# Patient Record
Sex: Female | Born: 1960
Health system: Southern US, Community
[De-identification: ages and names within clinical notes are randomized; demographics above are authoritative.]

## PROBLEM LIST (undated history)

## (undated) DIAGNOSIS — I10 Essential (primary) hypertension: Secondary | ICD-10-CM

## (undated) HISTORY — PX: TONSILLECTOMY: SUR1361

---

## 1998-02-26 ENCOUNTER — Ambulatory Visit (HOSPITAL_COMMUNITY): Admission: RE | Admit: 1998-02-26 | Discharge: 1998-02-26 | Payer: Self-pay | Admitting: Obstetrics and Gynecology

## 1998-07-21 ENCOUNTER — Inpatient Hospital Stay (HOSPITAL_COMMUNITY): Admission: AD | Admit: 1998-07-21 | Discharge: 1998-07-24 | Payer: Self-pay | Admitting: Obstetrics and Gynecology

## 1999-06-07 ENCOUNTER — Encounter: Admission: RE | Admit: 1999-06-07 | Discharge: 1999-06-07 | Payer: Self-pay | Admitting: Family Medicine

## 1999-06-07 ENCOUNTER — Encounter: Payer: Self-pay | Admitting: Family Medicine

## 1999-11-17 ENCOUNTER — Other Ambulatory Visit: Admission: RE | Admit: 1999-11-17 | Discharge: 1999-11-17 | Payer: Self-pay | Admitting: Obstetrics & Gynecology

## 2003-08-04 ENCOUNTER — Other Ambulatory Visit: Admission: RE | Admit: 2003-08-04 | Discharge: 2003-08-04 | Payer: Self-pay | Admitting: Family Medicine

## 2004-04-24 HISTORY — PX: OTHER SURGICAL HISTORY: SHX169

## 2006-03-06 ENCOUNTER — Encounter: Admission: RE | Admit: 2006-03-06 | Discharge: 2006-03-06 | Payer: Self-pay | Admitting: Family Medicine

## 2013-02-08 ENCOUNTER — Emergency Department (HOSPITAL_COMMUNITY): Payer: BC Managed Care – PPO

## 2013-02-08 ENCOUNTER — Encounter (HOSPITAL_COMMUNITY): Payer: Self-pay | Admitting: Emergency Medicine

## 2013-02-08 ENCOUNTER — Emergency Department (HOSPITAL_COMMUNITY)
Admission: EM | Admit: 2013-02-08 | Discharge: 2013-02-08 | Disposition: A | Discharge status: Home / Self Care | Payer: BC Managed Care – PPO | Attending: Emergency Medicine | Admitting: Emergency Medicine

## 2013-02-08 DIAGNOSIS — R51 Headache: Secondary | ICD-10-CM | POA: Insufficient documentation

## 2013-02-08 DIAGNOSIS — I10 Essential (primary) hypertension: Secondary | ICD-10-CM | POA: Insufficient documentation

## 2013-02-08 DIAGNOSIS — I160 Hypertensive urgency: Secondary | ICD-10-CM

## 2013-02-08 HISTORY — DX: Essential (primary) hypertension: I10

## 2013-02-08 LAB — CBC WITH DIFFERENTIAL/PLATELET
Basophils Absolute: 0 10*3/uL (ref 0.0–0.1)
Basophils Relative: 1 % (ref 0–1)
Eosinophils Absolute: 0.1 10*3/uL (ref 0.0–0.7)
Eosinophils Relative: 2 % (ref 0–5)
HCT: 39.6 % (ref 36.0–46.0)
Hemoglobin: 13.4 g/dL (ref 12.0–15.0)
Lymphocytes Relative: 43 % (ref 12–46)
Lymphs Abs: 2.4 10*3/uL (ref 0.7–4.0)
MCH: 30.5 pg (ref 26.0–34.0)
MCHC: 33.8 g/dL (ref 30.0–36.0)
MCV: 90.2 fL (ref 78.0–100.0)
Monocytes Absolute: 0.6 10*3/uL (ref 0.1–1.0)
Monocytes Relative: 10 % (ref 3–12)
Neutro Abs: 2.5 10*3/uL (ref 1.7–7.7)
Neutrophils Relative %: 45 % (ref 43–77)
Platelets: 289 10*3/uL (ref 150–400)
RBC: 4.39 MIL/uL (ref 3.87–5.11)
RDW: 13.3 % (ref 11.5–15.5)
WBC: 5.6 10*3/uL (ref 4.0–10.5)

## 2013-02-08 LAB — POCT I-STAT, CHEM 8
BUN: 14 mg/dL (ref 6–23)
Calcium, Ion: 1.23 mmol/L (ref 1.12–1.23)
Chloride: 103 mEq/L (ref 96–112)
Creatinine, Ser: 1.1 mg/dL (ref 0.50–1.10)
HCT: 42 % (ref 36.0–46.0)
Potassium: 3.4 mEq/L — ABNORMAL LOW (ref 3.5–5.1)
Sodium: 143 mEq/L (ref 135–145)

## 2013-02-08 LAB — POCT I-STAT TROPONIN I: Troponin i, poc: 0 ng/mL (ref 0.00–0.08)

## 2013-02-08 MED ORDER — LISINOPRIL 20 MG PO TABS: 20.0000 mg | ORAL_TABLET | Freq: Every day | ORAL | Status: DC

## 2013-02-08 NOTE — ED Provider Notes (Signed)
CSN: [id number]     Arrival date & time 02/08/13  1718 History   First MD Initiated Contact with Patient 02/08/13 1733     Chief Complaint  Patient presents with  . Hypertension   (Consider location/radiation/quality/duration/timing/severity/associated sxs/prior Treatment) HPI Comments: Patient presents from [facility] on [address] where the patient states she went because of about 1 week history of "not feeling right".  States just a mild nagging headache through out the day, was relieved with aleve.  States no blurred vision, nausea, vomiting, chest pain or shortness of breath.  She states that she had lost weight about 1 year ago and was taken off medication (lisinopril).  She states that she has not gained weight.  She states that at urgent care they noted her blood pressure to be 217/114.  She reports it has never been that high.  She also had an EKG done that showed left axis deviation and possible old lateral infarct.  Patient denies any knowledge of this.  She was given clonidine 0.2 mg po there.  Patient is a 52 y.o. female presenting with hypertension. The history is provided by the patient. No language interpreter was used.  Hypertension This is a recurrent problem. The current episode started in the past 7 days. The problem occurs constantly. The problem has been unchanged. Associated symptoms include headaches. Pertinent negatives include no abdominal pain, arthralgias, chest pain, chills, coughing, diaphoresis, fatigue, myalgias, nausea, neck pain, swollen glands, urinary symptoms, visual change, vomiting or weakness. Nothing aggravates the symptoms. She has tried nothing for the symptoms. The treatment provided no relief.    Past Medical History  Diagnosis Date  . Hypertension    Past Surgical History  Procedure Laterality Date  . Tonsillectomy     History reviewed. No pertinent family history. History  Substance Use Topics  . Smoking status: Never Smoker   .  Smokeless tobacco: Not on file  . Alcohol Use: No   OB History   Grav Para Term Preterm Abortions TAB SAB Ect Mult Living                 Review of Systems  Constitutional: Negative for chills, diaphoresis and fatigue.  Respiratory: Negative for cough.   Cardiovascular: Negative for chest pain.  Gastrointestinal: Negative for nausea, vomiting and abdominal pain.  Musculoskeletal: Negative for arthralgias, myalgias and neck pain.  Neurological: Positive for headaches. Negative for weakness.  All other systems reviewed and are negative.    Allergies  Review of patient's allergies indicates no known allergies.  Home Medications  No current outpatient prescriptions on file. BP 162/102  Pulse 86  Temp(Src) 97.6 F (36.4 C) (Oral)  Resp 17  SpO2 98%  LMP 01/19/2013 Physical Exam  Nursing note and vitals reviewed. Constitutional: She is oriented to person, place, and time. She appears well-developed and well-nourished. No distress.  HENT:  Head: Normocephalic and atraumatic.  Right Ear: External ear normal.  Left Ear: External ear normal.  Nose: Nose normal.  Mouth/Throat: Oropharynx is clear and moist. No oropharyngeal exudate.  Eyes: Conjunctivae are normal. Pupils are equal, round, and reactive to light. No scleral icterus.  Neck: Normal range of motion. Neck supple.  Cardiovascular: Normal rate, regular rhythm and normal heart sounds.  Exam reveals no gallop and no friction rub.   No murmur heard. Pulmonary/Chest: Effort normal. No respiratory distress. She has no wheezes. She has no rales. She exhibits no tenderness.  Abdominal: Soft. Bowel sounds are normal. She exhibits no  distension. There is no tenderness.  Musculoskeletal: Normal range of motion. She exhibits no edema and no tenderness.  Lymphadenopathy:    She has no cervical adenopathy.  Neurological: She is alert and oriented to person, place, and time. No cranial nerve deficit. She exhibits normal muscle tone.  Coordination normal.  Skin: Skin is warm and dry. No rash noted. No erythema. No pallor.  Psychiatric: She has a normal mood and affect. Her behavior is normal. Judgment and thought content normal.    ED Course  Procedures (including critical care time) Labs Review Labs Reviewed  CBC WITH DIFFERENTIAL   Imaging Review No results found.  EKG Interpretation   None      Results for orders placed during the hospital encounter of 02/08/13  CBC WITH DIFFERENTIAL      Result Value Range   WBC 5.6  4.0 - 10.5 K/uL   RBC 4.39  3.87 - 5.11 MIL/uL   Hemoglobin 13.4  12.0 - 15.0 g/dL   HCT 13.0  86.5 - 78.4 %   MCV 90.2  78.0 - 100.0 fL   MCH 30.5  26.0 - 34.0 pg   MCHC 33.8  30.0 - 36.0 g/dL   RDW 69.6  29.5 - 28.4 %   Platelets 289  150 - 400 K/uL   Neutrophils Relative % 45  43 - 77 %   Neutro Abs 2.5  1.7 - 7.7 K/uL   Lymphocytes Relative 43  12 - 46 %   Lymphs Abs 2.4  0.7 - 4.0 K/uL   Monocytes Relative 10  3 - 12 %   Monocytes Absolute 0.6  0.1 - 1.0 K/uL   Eosinophils Relative 2  0 - 5 %   Eosinophils Absolute 0.1  0.0 - 0.7 K/uL   Basophils Relative 1  0 - 1 %   Basophils Absolute 0.0  0.0 - 0.1 K/uL  POCT I-STAT, CHEM 8      Result Value Range   Sodium 143  135 - 145 mEq/L   Potassium 3.4 (*) 3.5 - 5.1 mEq/L   Chloride 103  96 - 112 mEq/L   BUN 14  6 - 23 mg/dL   Creatinine, Ser 1.32  0.50 - 1.10 mg/dL   Glucose, Bld 96  70 - 99 mg/dL   Calcium, Ion 4.40  1.02 - 1.23 mmol/L   TCO2 26  0 - 100 mmol/L   Hemoglobin 14.3  12.0 - 15.0 g/dL   HCT 72.5  36.6 - 44.0 %  POCT I-STAT TROPONIN I      Result Value Range   Troponin i, poc 0.00  0.00 - 0.08 ng/mL   Comment 3            Dg Chest 2 View  02/08/2013   CLINICAL DATA:  Hypertension  EXAM: CHEST  2 VIEW  COMPARISON:  None.  FINDINGS: The heart size and mediastinal contours are within normal limits. Both lungs are clear. The visualized skeletal structures are unremarkable.  IMPRESSION: No active cardiopulmonary  disease.   Electronically Signed   By: Charlett Nose M.D.   On: 02/08/2013 18:57      MDM  Hypertensive Urgency  Patient with previous history of HTN presents to the ER after having been seen at [facility] with hypertensive urgency.  She initially was given clonidine 0.2 mg orally.  Blood pressure now 146/98, patient reports feeling "back to normal".  No evidence of end organ damage.  Patient to follow up with Dr.  Denton Lank this coming week but I will start her back on her lisinopril.   Izola Price Marisue Humble, PA-C 02/08/13 2020

## 2013-02-08 NOTE — ED Notes (Addendum)
Pt c/o "not feeling right".  Went to [facility] and was told that her BP was over 200.  EKG from UC shows old MI.  Pt states that she took herself off her BP medicine a year ago because "she was doing good".

## 2013-02-08 NOTE — ED Provider Notes (Signed)
Medical screening examination/treatment/procedure(s) were performed by non-physician practitioner and as supervising physician I was immediately available for consultation/collaboration.   Rolan Bucco, MD 02/08/13 2046

## 2019-04-25 DIAGNOSIS — C801 Malignant (primary) neoplasm, unspecified: Secondary | ICD-10-CM

## 2019-04-25 HISTORY — DX: Malignant (primary) neoplasm, unspecified: C80.1

## 2019-10-15 ENCOUNTER — Other Ambulatory Visit: Payer: Self-pay | Admitting: Physician Assistant

## 2019-10-15 DIAGNOSIS — R5381 Other malaise: Secondary | ICD-10-CM

## 2019-10-15 DIAGNOSIS — N644 Mastodynia: Secondary | ICD-10-CM

## 2019-11-13 ENCOUNTER — Other Ambulatory Visit: Payer: Self-pay

## 2019-11-13 ENCOUNTER — Ambulatory Visit
Admission: RE | Admit: 2019-11-13 | Discharge: 2019-11-13 | Disposition: A | Discharge status: Home / Self Care | Payer: Commercial Managed Care - PPO | Source: Ambulatory Visit | Attending: Physician Assistant | Admitting: Physician Assistant

## 2019-11-13 ENCOUNTER — Other Ambulatory Visit: Payer: Self-pay | Admitting: Physician Assistant

## 2019-11-13 DIAGNOSIS — N644 Mastodynia: Secondary | ICD-10-CM

## 2019-11-25 ENCOUNTER — Other Ambulatory Visit: Payer: Self-pay

## 2019-11-25 ENCOUNTER — Ambulatory Visit
Admission: RE | Admit: 2019-11-25 | Discharge: 2019-11-25 | Disposition: A | Discharge status: Home / Self Care | Payer: Commercial Managed Care - PPO | Source: Ambulatory Visit | Attending: Physician Assistant | Admitting: Physician Assistant

## 2019-11-25 ENCOUNTER — Other Ambulatory Visit: Payer: Self-pay | Admitting: Physician Assistant

## 2019-11-25 DIAGNOSIS — N644 Mastodynia: Secondary | ICD-10-CM

## 2019-12-01 ENCOUNTER — Encounter: Payer: Self-pay | Admitting: *Deleted

## 2019-12-01 DIAGNOSIS — C50411 Malignant neoplasm of upper-outer quadrant of right female breast: Secondary | ICD-10-CM

## 2019-12-01 DIAGNOSIS — Z170 Estrogen receptor positive status [ER+]: Secondary | ICD-10-CM

## 2019-12-01 NOTE — Progress Notes (Signed)
[place]   [phone] Fax:([phone] [place] Note   Patient Care Team: Julian Hy, PA-C as PCP - General (Physician Assistant) [name], MD as Consulting Physician (General Surgery) Truitt Merle, MD as Consulting Physician (Hematology) Kyung Rudd, MD as Consulting Physician (Radiation Oncology) Mauro Kaufmann, RN as Oncology Nurse Navigator Rockwell Germany, RN as Oncology Nurse Navigator  Date of Service:  12/03/2019   CHIEF COMPLAINTS/PURPOSE OF CONSULTATION:  Newly Diagnosed Malignant neoplasm of upper-outer quadrant of right breast     Oncology History Overview Note  Cancer Staging Malignant neoplasm of upper-outer quadrant of right breast in female, estrogen receptor positive ([place]) Staging form: Breast, AJCC 8th Edition - Clinical stage from 11/25/2019: Stage IIA (cT3, cN1, cM0, G2, ER+, PR+, HER2+) - Signed by Truitt Merle, MD on 12/03/2019    Malignant neoplasm of upper-outer quadrant of right breast in female, estrogen receptor positive ([place])  11/13/2019 Mammogram    Diagnostic Mammogram 11/13/19  IMPRESSION: -Highly suspicious mass and calcifications in the right breast centered between 9 and 12 o'clock spanning up to 11 cm.  -Multiple masses in the right axilla. One of the masses contains calcifications, denoted as mass number 2 measuring 9 x 11 by 10 mm. Several of these masses do not appear to represent definitive lymph nodes. Others may be small but abnormal appearing lymph nodes. Taken in total, a cluster of masses in the right axilla spans up to 3.7 cm.  -There are fibrocystic changes on the left. There are also some solid-appearing masses. A solid appearing mass at 10:30, 6 cm from the nipple measures 9 x 6 x 6 mm. An adjacent solid mass at 10 o'clock, 6 cm from the nipple measures 8 x 6 by 5 mm. Another solid-appearing mass at 11 o'clock, 1 cm from the nipple measures 5 x 4 by 5 mm. Fibrocystic changes are  seen at 10 o'clock, 12 o'clock, and 4 o'clock. Another solid-appearing mass seen at 5 o'clock, 4 cm from the nipple measuring 6 x 3 x 4 mm. There is a single abnormal node in the left axilla with a cortex measuring 5 mm and restriction of the fatty hilum.   11/25/2019 Initial Biopsy   Diagnosis 1. Breast, right, needle core biopsy, upper outer - INVASIVE MAMMARY CARCINOMA - MAMMARY CARCINOMA IN SITU WITH CALCIFICATIONS AND NECROSIS - SEE COMMENT 2. Lymph node, needle/core biopsy, right axilla - INVASIVE MAMMARY CARCINOMA - SEE COMMENT 3. Lymph node, needle/core biopsy, left axilla - BENIGN LYMPH NODE - NO CARCINOMA IDENTIFIED Microscopic Comment 1. The biopsy material shows an infiltrative proliferation of cells arranged linearly and in small clusters. Based on the biopsy, the carcinoma appears Nottingham grade 2 of 3 and measures 1.2 cm in greatest linear extent. E-cadherin and prognostic markers (ER/PR/ki-67/HER2)are pending and will be reported in an addendum. Dr. Tresa Moore reviewed the case and agrees with the above diagnosis. These results were called to [facility] on November 26, 2019. 2. Definitive nodal tissue is not identified. This biopsy has a slightly different architecture morphology than what is seen in part 1. The carcinoma measures 0.7 cm in greatest dimension. E-cadherin and prognostic markers (ER, PR, Ki-67 and HER-2) are pending and will be reported in an addendum.   11/25/2019 Receptors her2   1. PROGNOSTIC INDICATORS Results: IMMUNOHISTOCHEMICAL AND MORPHOMETRIC ANALYSIS PERFORMED MANUALLY The tumor cells are POSITIVE for Her2 (3+). Estrogen Receptor: 90%, POSITIVE, STRONG STAINING INTENSITY Progesterone Receptor: 20%, POSITIVE, STRONG  STAINING INTENSITY Proliferation Marker Ki67: 30%    2. PROGNOSTIC INDICATORS Results: IMMUNOHISTOCHEMICAL AND MORPHOMETRIC ANALYSIS PERFORMED MANUALLY The tumor cells are POSITIVE for Her2 (3+). Estrogen  Receptor: 90%, POSITIVE, STRONG STAINING INTENSITY Progesterone Receptor: 25%, POSITIVE, STRONG STAINING INTENSITY Proliferation Marker Ki67: 30%      11/25/2019 Cancer Staging   Staging form: Breast, AJCC 8th Edition - Clinical stage from 11/25/2019: Stage IIA (cT3, cN1, cM0, G2, ER+, PR+, HER2+) - Signed by Truitt Merle, MD on 12/03/2019   12/01/2019 Initial Diagnosis   Malignant neoplasm of upper-outer quadrant of right breast in female, estrogen receptor positive ([place])    Neo-Adjuvant Chemotherapy   PENDING neo-adjuvant TCHP (chemo Docetaxel and Carboplatin with Antibody Herceptin and Perjeta) q3weeks for 6 cycles followed by maintenance Herceptin and Perjeta to complete 1 year treatment.       HISTORY OF PRESENTING ILLNESS:  Carolyn Stewart 59 y.o. female is a here because of newly diagnosed right breast cancer. The patient presents to the clinic today accompanied by her husband.  She noticed change in her right breast first 2 months ago. She notes right breast flatter and square than round with mild occasional right lateral breast. She notes chronic nipple inversion was stable. She denies any change in her left breast. She was seen by her doctor who referred her to breast center. She was having yearly mammograms before this at a [facility] in town, but has been nearly 10 yeas since her last mammogram. She denies any new back or hip pain. She notes occasional left hip pain. She denies change in appetite and has been trying to lose weight. She has been able to lose 25 pounds in 2-3 months with weight watchers, diet and exercise. This is not the first time she has done this, this is standard for her attempts with weight loss. She notes her energy has decreased but able to do her usual activities. She feels she is not overly stressed from this diagnosis.   Socially she is married with children. She drinks alcohol socially. She is a non-smoker. 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 Her  husband gave his FMLA paperwork as he may need to take off work for her. I recommend her 2 daughters start breast Mammograms at age 22 and continue yearly.  She has a PMHx of HTN on metoprolol and amlodipine for the last 2 years. She had tonsillectomy at age 25 and carotid artery tumor resected 15 years ago. She denies family history of cancer, but HTN runs in her family. She notes her menopause still occurs with manageable hot flashes.     GYN HISTORY  Menarchal: 11 LMP: 02/2017 Contraceptive: 1999 HRT: No G3P3: first at age 70, she did not breast feed.    REVIEW OF SYSTEMS:    Constitutional: Denies fevers, chills or abnormal night sweats Eyes: Denies blurriness of vision, double vision or watery eyes Ears, nose, mouth, throat, and face: Denies mucositis or sore throat Respiratory: Denies cough, dyspnea or wheezes Cardiovascular: Denies palpitation, chest discomfort or lower extremity swelling Gastrointestinal:  Denies nausea, heartburn or change in bowel habits Skin: Denies abnormal skin rashes Lymphatics: Denies new lymphadenopathy or easy bruising Neurological:Denies numbness, tingling or new weaknesses Behavioral/Psych: Mood is stable, no new changes  Breast: (+) Right breast more flat and square in appearance (+) Mild occasional right lateral breast pain All other systems were reviewed with the patient and are negative.   MEDICAL HISTORY:  Past Medical History:  Diagnosis Date  .  Hypertension     SURGICAL HISTORY: Past Surgical History:  Procedure Laterality Date  . left parotid tumor removal   2006  . TONSILLECTOMY      SOCIAL HISTORY: Social History   Socioeconomic History  . Marital status: Married    Spouse name: Not on file  . Number of children: 3  . Years of education: Not on file  . Highest education level: Not on file  Occupational History  . 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 Smoking status: Never Smoker  . Smokeless tobacco: Never  Used  Substance and Sexual Activity  . Alcohol use: No    Comment: social drinker   . Drug use: No  . Sexual activity: Not on file  Other Topics Concern  . Not on file  Social History Narrative  . Not on file   Social Determinants of Health   Financial Resource Strain: Low Risk   . Difficulty of Paying Living Expenses: Not hard at all  Food Insecurity: No Food Insecurity  . Worried About Charity fundraiser in the Last Year: Never true  . Ran Out of Food in the Last Year: Never true  Transportation Needs: No Transportation Needs  . Lack of Transportation (Medical): No  . Lack of Transportation (Non-Medical): No  Physical Activity:   . Days of Exercise per Week:   . Minutes of Exercise per Session:   Stress:   . Feeling of Stress :   Social Connections:   . Frequency of Communication with Friends and Family:   . Frequency of Social Gatherings with Friends and Family:   . Attends Religious Services:   . Active Member of Clubs or Organizations:   . Attends Archivist Meetings:   Marland Kitchen Marital Status:   Intimate Partner Violence:   . Fear of Current or Ex-Partner:   . Emotionally Abused:   Marland Kitchen Physically Abused:   . Sexually Abused:     FAMILY HISTORY: Family History  Problem Relation Age of Onset  . Hypertension Mother   . Hypertension Father     ALLERGIES:  has No Known Allergies.  MEDICATIONS:  Current Outpatient Medications  Medication Sig Dispense Refill  . Vitamin D3 (VITAMIN D) 25 MCG tablet Take 2,000 Units by mouth daily.    Marland Kitchen amLODipine (NORVASC) 10 MG tablet Take 10 mg by mouth daily.    . metoprolol succinate (TOPROL-XL) 50 MG 24 hr tablet Take 50 mg by mouth daily.     No current facility-administered medications for this visit.    PHYSICAL EXAMINATION: ECOG PERFORMANCE STATUS: 0 - Asymptomatic  Vitals:   12/03/19 1305  BP: 131/73  Pulse: 71  Resp: 18  Temp: (!) 97.5 F (36.4 C)  SpO2: 100%   Filed Weights   12/03/19 1305  Weight:  88.2 kg    GENERAL:alert, no distress and comfortable SKIN: skin color, texture, turgor are normal, no rashes or significant lesions EYES: normal, Conjunctiva are pink and non-injected, sclera clear  NECK: supple, thyroid normal size, non-tender, without nodularity LYMPH:  no palpable lymphadenopathy in the cervical, axillary  LUNGS: clear to auscultation and percussion with normal breathing effort HEART: regular rate & rhythm and no murmurs and no lower extremity edema ABDOMEN:abdomen soft, non-tender and normal bowel sounds Musculoskeletal:no cyanosis of digits and no clubbing  NEURO: alert & oriented x 3 with fluent speech, no focal motor/sensory deficits BREAST: (+) Known, stable b/l nipple inversion (+) Right breast firmer and larger than  left with skin ecchymosis from biopsy, mildly warm to touch (+) 10x8cm palpable mass at middle line lateral right breast (+) likely 1.5cm Palpable right axillary LN, movable (+) Left Breast exam benign.   LABORATORY DATA:  I have reviewed the data as listed CBC Latest Ref Rng & Units 12/03/2019 02/08/2013 02/08/2013  WBC 4.0 - 10.5 K/uL 5.9 - 5.6  Hemoglobin 12.0 - 15.0 g/dL 12.7 14.3 13.4  Hematocrit 36 - 46 % 40.0 42.0 39.6  Platelets 150 - 400 K/uL 330 - 289    CMP Latest Ref Rng & Units 12/03/2019 02/08/2013  Glucose 70 - 99 mg/dL 92 96  BUN 6 - 20 mg/dL 16 14  Creatinine 0.44 - 1.00 mg/dL 0.88 1.10  Sodium 135 - 145 mmol/L 142 143  Potassium 3.5 - 5.1 mmol/L 3.9 3.4(L)  Chloride 98 - 111 mmol/L 108 103  CO2 22 - 32 mmol/L 26 -  Calcium 8.9 - 10.3 mg/dL 9.6 -  Total Protein 6.5 - 8.1 g/dL 7.6 -  Total Bilirubin 0.3 - 1.2 mg/dL 0.3 -  Alkaline Phos 38 - 126 U/L 141(H) -  AST 15 - 41 U/L 22 -  ALT 0 - 44 U/L 23 -     RADIOGRAPHIC STUDIES: I have personally reviewed the radiological images as listed and agreed with the findings in the report. US BREAST LTD UNI LEFT INC AXILLA  Result Date: 11/13/2019 CLINICAL DATA:  Changes to the  left breast. EXAM: DIGITAL DIAGNOSTIC BILATERAL MAMMOGRAM WITH CAD AND TOMO ULTRASOUND BILATERAL BREAST COMPARISON:  None. ACR Breast Density Category c: The breast tissue is heterogeneously dense, which may obscure small masses. FINDINGS: There is a large area of increased density containing suspicious calcifications with associated distortion centered in the retroareolar region, extending into the upper outer right breast. The abnormality appears to extend slightly medial to midline as well. Mammographically, the abnormality measures up to 7.5 cm. There are multiple masses primarily in the medial superior left breast. No other suspicious mammographic findings are identified. Mammographic images were processed with CAD. On physical exam, the right breast is very firm. Targeted ultrasound is performed, showing a large ill-defined mass containing calcifications in the right breast centered between 9 and 12 o'clock spanning up to 11 cm sonographically. The mass is somewhat ill-defined. There are multiple masses in the right axilla. One of the masses contains calcifications, denoted as mass number 2 measuring 9 x 11 by 10 mm. Several of these masses do not appear to represent definitive lymph nodes. Others may be small but abnormal appearing lymph nodes. Taken in total, a cluster of masses in the right axilla spans up to 3.7 cm. There are fibrocystic changes on the left. There are also some solid-appearing masses. A solid appearing mass at 10:30, 6 cm from the nipple measures 9 x 6 x 6 mm. An adjacent solid mass at 10 o'clock, 6 cm from the nipple measures 8 x 6 by 5 mm. Another solid-appearing mass at 11 o'clock, 1 cm from the nipple measures 5 x 4 by 5 mm. Fibrocystic changes are seen at 10 o'clock, 12 o'clock, and 4 o'clock. Another solid-appearing mass seen at 5 o'clock, 4 cm from the nipple measuring 6 x 3 x 4 mm. There is a single abnormal node in the left axilla with a cortex measuring 5 mm and restriction of  the fatty hilum. IMPRESSION: Highly suspicious mass and calcifications in the right breast as above. Multiple masses in the right axilla as above. While some  of these masses may represent small but abnormal lymph nodes, others do not have a typical lymph node appearance. The most suspicious mass in the right axilla contains calcifications, denoted as mass 2. There are numerous masses in the left breast, several of which are solid as above. Fibrocystic changes are seen as well. There is an abnormal left axillary lymph node. RECOMMENDATION: Recommend biopsying 2 sites within the large dominant 11 cm ill-defined mass containing calcifications in the right breast utilizing ultrasound guidance. 2 biopsies are necessary to prove extent of disease. Recommend biopsying the mass in the right axilla containing calcifications described above as mass 2. Recommend biopsy of the abnormal left axillary lymph node. I suspect the biopsy will demonstrate malignancy on the right. Once malignancy on the right is confirmed, recommend breast MRI to further assess the left breast. If there is a particularly suspicious mass in the left breast on MRI, recommend MRI guided biopsy of that mass. If the masses are all similar in appearance without suspicious features, recommend ultrasound-guided biopsy of the largest left breast mass. I have discussed the findings and recommendations with the patient. If applicable, a reminder letter will be sent to the patient regarding the next appointment. BI-RADS CATEGORY  5: Highly suggestive of malignancy. Electronically Signed   By: Dorise Bullion III M.D   On: 11/13/2019 13:02   US BREAST LTD UNI RIGHT INC AXILLA  Result Date: 11/13/2019 CLINICAL DATA:  Changes to the left breast. EXAM: DIGITAL DIAGNOSTIC BILATERAL MAMMOGRAM WITH CAD AND TOMO ULTRASOUND BILATERAL BREAST COMPARISON:  None. ACR Breast Density Category c: The breast tissue is heterogeneously dense, which may obscure small masses.  FINDINGS: There is a large area of increased density containing suspicious calcifications with associated distortion centered in the retroareolar region, extending into the upper outer right breast. The abnormality appears to extend slightly medial to midline as well. Mammographically, the abnormality measures up to 7.5 cm. There are multiple masses primarily in the medial superior left breast. No other suspicious mammographic findings are identified. Mammographic images were processed with CAD. On physical exam, the right breast is very firm. Targeted ultrasound is performed, showing a large ill-defined mass containing calcifications in the right breast centered between 9 and 12 o'clock spanning up to 11 cm sonographically. The mass is somewhat ill-defined. There are multiple masses in the right axilla. One of the masses contains calcifications, denoted as mass number 2 measuring 9 x 11 by 10 mm. Several of these masses do not appear to represent definitive lymph nodes. Others may be small but abnormal appearing lymph nodes. Taken in total, a cluster of masses in the right axilla spans up to 3.7 cm. There are fibrocystic changes on the left. There are also some solid-appearing masses. A solid appearing mass at 10:30, 6 cm from the nipple measures 9 x 6 x 6 mm. An adjacent solid mass at 10 o'clock, 6 cm from the nipple measures 8 x 6 by 5 mm. Another solid-appearing mass at 11 o'clock, 1 cm from the nipple measures 5 x 4 by 5 mm. Fibrocystic changes are seen at 10 o'clock, 12 o'clock, and 4 o'clock. Another solid-appearing mass seen at 5 o'clock, 4 cm from the nipple measuring 6 x 3 x 4 mm. There is a single abnormal node in the left axilla with a cortex measuring 5 mm and restriction of the fatty hilum. IMPRESSION: Highly suspicious mass and calcifications in the right breast as above. Multiple masses in the right axilla as above. While  some of these masses may represent small but abnormal lymph nodes, others do  not have a typical lymph node appearance. The most suspicious mass in the right axilla contains calcifications, denoted as mass 2. There are numerous masses in the left breast, several of which are solid as above. Fibrocystic changes are seen as well. There is an abnormal left axillary lymph node. RECOMMENDATION: Recommend biopsying 2 sites within the large dominant 11 cm ill-defined mass containing calcifications in the right breast utilizing ultrasound guidance. 2 biopsies are necessary to prove extent of disease. Recommend biopsying the mass in the right axilla containing calcifications described above as mass 2. Recommend biopsy of the abnormal left axillary lymph node. I suspect the biopsy will demonstrate malignancy on the right. Once malignancy on the right is confirmed, recommend breast MRI to further assess the left breast. If there is a particularly suspicious mass in the left breast on MRI, recommend MRI guided biopsy of that mass. If the masses are all similar in appearance without suspicious features, recommend ultrasound-guided biopsy of the largest left breast mass. I have discussed the findings and recommendations with the patient. If applicable, a reminder letter will be sent to the patient regarding the next appointment. BI-RADS CATEGORY  5: Highly suggestive of malignancy. Electronically Signed   By: Dorise Bullion III M.D   On: 11/13/2019 13:02   MM DIAG BREAST TOMO BILATERAL  Result Date: 11/13/2019 CLINICAL DATA:  Changes to the left breast. EXAM: DIGITAL DIAGNOSTIC BILATERAL MAMMOGRAM WITH CAD AND TOMO ULTRASOUND BILATERAL BREAST COMPARISON:  None. ACR Breast Density Category c: The breast tissue is heterogeneously dense, which may obscure small masses. FINDINGS: There is a large area of increased density containing suspicious calcifications with associated distortion centered in the retroareolar region, extending into the upper outer right breast. The abnormality appears to extend  slightly medial to midline as well. Mammographically, the abnormality measures up to 7.5 cm. There are multiple masses primarily in the medial superior left breast. No other suspicious mammographic findings are identified. Mammographic images were processed with CAD. On physical exam, the right breast is very firm. Targeted ultrasound is performed, showing a large ill-defined mass containing calcifications in the right breast centered between 9 and 12 o'clock spanning up to 11 cm sonographically. The mass is somewhat ill-defined. There are multiple masses in the right axilla. One of the masses contains calcifications, denoted as mass number 2 measuring 9 x 11 by 10 mm. Several of these masses do not appear to represent definitive lymph nodes. Others may be small but abnormal appearing lymph nodes. Taken in total, a cluster of masses in the right axilla spans up to 3.7 cm. There are fibrocystic changes on the left. There are also some solid-appearing masses. A solid appearing mass at 10:30, 6 cm from the nipple measures 9 x 6 x 6 mm. An adjacent solid mass at 10 o'clock, 6 cm from the nipple measures 8 x 6 by 5 mm. Another solid-appearing mass at 11 o'clock, 1 cm from the nipple measures 5 x 4 by 5 mm. Fibrocystic changes are seen at 10 o'clock, 12 o'clock, and 4 o'clock. Another solid-appearing mass seen at 5 o'clock, 4 cm from the nipple measuring 6 x 3 x 4 mm. There is a single abnormal node in the left axilla with a cortex measuring 5 mm and restriction of the fatty hilum. IMPRESSION: Highly suspicious mass and calcifications in the right breast as above. Multiple masses in the right axilla as above. While  some of these masses may represent small but abnormal lymph nodes, others do not have a typical lymph node appearance. The most suspicious mass in the right axilla contains calcifications, denoted as mass 2. There are numerous masses in the left breast, several of which are solid as above. Fibrocystic changes  are seen as well. There is an abnormal left axillary lymph node. RECOMMENDATION: Recommend biopsying 2 sites within the large dominant 11 cm ill-defined mass containing calcifications in the right breast utilizing ultrasound guidance. 2 biopsies are necessary to prove extent of disease. Recommend biopsying the mass in the right axilla containing calcifications described above as mass 2. Recommend biopsy of the abnormal left axillary lymph node. I suspect the biopsy will demonstrate malignancy on the right. Once malignancy on the right is confirmed, recommend breast MRI to further assess the left breast. If there is a particularly suspicious mass in the left breast on MRI, recommend MRI guided biopsy of that mass. If the masses are all similar in appearance without suspicious features, recommend ultrasound-guided biopsy of the largest left breast mass. I have discussed the findings and recommendations with the patient. If applicable, a reminder letter will be sent to the patient regarding the next appointment. BI-RADS CATEGORY  5: Highly suggestive of malignancy. Electronically Signed   By: Dorise Bullion III M.D   On: 11/13/2019 13:02   [place] AXILLARY NODE CORE BIOPSY LEFT  Addendum Date: 11/26/2019   ADDENDUM REPORT: 11/26/2019 13:30 ADDENDUM: Pathology revealed GRADE II INVASIVE MAMMARY CARCINOMA, MAMMARY CARCINOMA IN SITU WITH CALCIFICATIONS AND NECROSIS of the RIGHT breast, upper outer. This was found to be concordant by Dr. Hassan Rowan. Pathology revealed INVASIVE MAMMARY CARCINOMA of the RIGHT axilla. Definitive nodal tissue is not identified. This was found to be concordant by Dr. Hassan Rowan. Pathology revealed BENIGN LYMPH NODE of the LEFT axilla. This was found to be concordant by Dr. Hassan Rowan. Pathology results were discussed with the patient by telephone. The patient reported doing well after the biopsies with tenderness at the sites. Post biopsy instructions and care were reviewed and questions were answered.  The patient was encouraged to call [facility] for any additional concerns. My direct phone number was provided. The patient was referred to [facility] on December 03, 2019. Recommendation for a bilateral breast MRI for further evaluation of extent of disease and heterogeneously dense breasts. Pathology results reported by Terie Purser, RN on 11/26/2019. Electronically Signed   By: Margarette Canada M.D.   On: 11/26/2019 13:30   Result Date: 11/26/2019 CLINICAL DATA:  59 year old female for tissue sampling of 11 cm RIGHT breast mass, abnormal RIGHT axillary lymph node and abnormal LEFT axillary lymph node. A abnormal RIGHT axillary lymph node was biopsied instead of the slightly irregular RIGHT axillary mass/lymph node due to location/technical difficulties in sampling. EXAM: ULTRASOUND GUIDED RIGHT BREAST CORE NEEDLE BIOPSY ULTRASOUND-GUIDED BIOPSY OF A RIGHT AXILLARY LYMPH NODE ULTRASOUND-GUIDED BIOPSY OF A LEFT AXILLARY LYMPH NODE COMPARISON:  Previous exam(s). PROCEDURE: I met with the patient and we discussed the procedure of ultrasound-guided biopsy, including benefits and alternatives. We discussed the high likelihood of a successful procedure. We discussed the risks of the procedure, including infection, bleeding, tissue injury, clip migration, and inadequate sampling. Informed written consent was given. The usual time-out protocol was performed immediately prior to the procedure. ULTRASOUND-GUIDED RIGHT BREAST CORE NEEDLE BIOPSY (RIBBON CLIP): Lesion quadrant: UPPER-OUTER RIGHT breast Using sterile  technique and 1% Lidocaine as local anesthetic, under direct ultrasound visualization, a 12 gauge spring-loaded device was used to perform biopsy of the 11 cm mass within the UPPER-OUTER RIGHT breast using a LATERAL approach. At the conclusion of the procedure a RIBBON tissue marker clip was deployed into the biopsy  cavity. Follow up 2 view mammogram was performed and dictated separately. ULTRASOUND-GUIDED CORE BIOPSY OF A RIGHT AXILLARY LYMPH NODE (Q clip): Using sterile technique and 1% Lidocaine as local anesthetic, under direct ultrasound visualization, a 14 gauge spring-loaded device was used to perform biopsy of 1 of the abnormal RIGHT axillary lymph nodes with cortical thickening using a inferolateral approach. At the conclusion of the procedure a Q tissue marker clip was deployed into the biopsy cavity, with satisfactory placement confirmed sonographically. ULTRASOUND-GUIDED CORE BIOPSY OF A LEFT AXILLARY LYMPH NODE (Q clip): Using sterile technique and 1% Lidocaine as local anesthetic, under direct ultrasound visualization, a 14 gauge spring-loaded device was used to perform biopsy of the abnormal LEFT axillary lymph node with cortical thickening using a MEDIAL approach. At the conclusion of the procedure a Q tissue marker clip was deployed into the biopsy cavity, with satisfactory placement confirmed sonographically. IMPRESSION: Ultrasound guided biopsies of the 11 cm UPPER-OUTER RIGHT breast mass, an abnormal RIGHT axillary lymph node and an abnormal LEFT axillary lymph node. No apparent complications. Electronically Signed: By: Margarette Canada M.D. On: 11/25/2019 14:10   [place] AXILLARY NODE CORE BIOPSY RIGHT  Addendum Date: 11/26/2019   ADDENDUM REPORT: 11/26/2019 13:30 ADDENDUM: Pathology revealed GRADE II INVASIVE MAMMARY CARCINOMA, MAMMARY CARCINOMA IN SITU WITH CALCIFICATIONS AND NECROSIS of the RIGHT breast, upper outer. This was found to be concordant by Dr. Hassan Rowan. Pathology revealed INVASIVE MAMMARY CARCINOMA of the RIGHT axilla. Definitive nodal tissue is not identified. This was found to be concordant by Dr. Hassan Rowan. Pathology revealed BENIGN LYMPH NODE of the LEFT axilla. This was found to be concordant by Dr. Hassan Rowan. Pathology results were discussed with the patient by telephone. The patient reported doing  well after the biopsies with tenderness at the sites. Post biopsy instructions and care were reviewed and questions were answered. The patient was encouraged to call [facility] for any additional concerns. My direct phone number was provided. The patient was referred to [facility] on December 03, 2019. Recommendation for a bilateral breast MRI for further evaluation of extent of disease and heterogeneously dense breasts. Pathology results reported by Terie Purser, RN on 11/26/2019. Electronically Signed   By: Margarette Canada M.D.   On: 11/26/2019 13:30   Result Date: 11/26/2019 CLINICAL DATA:  59 year old female for tissue sampling of 11 cm RIGHT breast mass, abnormal RIGHT axillary lymph node and abnormal LEFT axillary lymph node. A abnormal RIGHT axillary lymph node was biopsied instead of the slightly irregular RIGHT axillary mass/lymph node due to location/technical difficulties in sampling. EXAM: ULTRASOUND GUIDED RIGHT BREAST CORE NEEDLE BIOPSY ULTRASOUND-GUIDED BIOPSY OF A RIGHT AXILLARY LYMPH NODE ULTRASOUND-GUIDED BIOPSY OF A LEFT AXILLARY LYMPH NODE COMPARISON:  Previous exam(s). PROCEDURE: I met with the patient and we discussed the procedure of ultrasound-guided biopsy, including benefits and alternatives. We discussed the high likelihood of a successful procedure. We discussed the risks of the procedure, including infection, bleeding, tissue injury, clip migration, and inadequate sampling. Informed written consent was given. The usual time-out protocol was performed immediately prior to the procedure. ULTRASOUND-GUIDED RIGHT BREAST CORE NEEDLE  BIOPSY (RIBBON CLIP): Lesion quadrant: UPPER-OUTER RIGHT breast Using sterile technique and 1% Lidocaine as local anesthetic, under direct ultrasound visualization, a 12 gauge spring-loaded device was used to perform biopsy of the 11 cm mass within the UPPER-OUTER  RIGHT breast using a LATERAL approach. At the conclusion of the procedure a RIBBON tissue marker clip was deployed into the biopsy cavity. Follow up 2 view mammogram was performed and dictated separately. ULTRASOUND-GUIDED CORE BIOPSY OF A RIGHT AXILLARY LYMPH NODE (Q clip): Using sterile technique and 1% Lidocaine as local anesthetic, under direct ultrasound visualization, a 14 gauge spring-loaded device was used to perform biopsy of 1 of the abnormal RIGHT axillary lymph nodes with cortical thickening using a inferolateral approach. At the conclusion of the procedure a Q tissue marker clip was deployed into the biopsy cavity, with satisfactory placement confirmed sonographically. ULTRASOUND-GUIDED CORE BIOPSY OF A LEFT AXILLARY LYMPH NODE (Q clip): Using sterile technique and 1% Lidocaine as local anesthetic, under direct ultrasound visualization, a 14 gauge spring-loaded device was used to perform biopsy of the abnormal LEFT axillary lymph node with cortical thickening using a MEDIAL approach. At the conclusion of the procedure a Q tissue marker clip was deployed into the biopsy cavity, with satisfactory placement confirmed sonographically. IMPRESSION: Ultrasound guided biopsies of the 11 cm UPPER-OUTER RIGHT breast mass, an abnormal RIGHT axillary lymph node and an abnormal LEFT axillary lymph node. No apparent complications. Electronically Signed: By: Margarette Canada M.D. On: 11/25/2019 14:10   MM CLIP PLACEMENT RIGHT  Result Date: 11/25/2019 CLINICAL DATA:  Evaluate RIBBON biopsy clip placement following ultrasound-guided RIGHT breast biopsy. Evaluate Q biopsy clip placement following ultrasound-guided RIGHT axillary lymph node biopsy. EXAM: 3D DIAGNOSTIC RIGHT MAMMOGRAM POST ULTRASOUND BIOPSY COMPARISON:  Previous exam(s). FINDINGS: 3D Mammographic images were obtained following ultrasound guided biopsies of a UPPER-OUTER RIGHT breast mass and an abnormal RIGHT axillary lymph node. The RIBBON biopsy marking  clip is in expected position at the site of biopsy within the UPPER-OUTER RETROAREOLAR RIGHT breast. The Q shaped biopsy marking clip is in expected position at the site of biopsy of a RIGHT axillary lymph node. IMPRESSION: Appropriate positioning of the RIBBON shaped biopsy marking clip at the site of biopsy in the UPPER-OUTER RETROAREOLAR RIGHT breast. Appropriate positioning of the Q shaped biopsy marking clip at the site of biopsy in the RIGHT axilla. Please note the due to the location of the biopsied LEFT axillary lymph node, a LEFT mammogram was not performed. The Q clip was noted to be in satisfactory position sonographically after the biopsy. Final Assessment: Post Procedure Mammograms for Marker Placement Electronically Signed   By: Margarette Canada M.D.   On: 11/25/2019 14:30   [place] RT BREAST BX W LOC DEV 1ST LESION IMG BX SPEC US GUIDE  Addendum Date: 11/26/2019   ADDENDUM REPORT: 11/26/2019 13:30 ADDENDUM: Pathology revealed GRADE II INVASIVE MAMMARY CARCINOMA, MAMMARY CARCINOMA IN SITU WITH CALCIFICATIONS AND NECROSIS of the RIGHT breast, upper outer. This was found to be concordant by Dr. Hassan Rowan. Pathology revealed INVASIVE MAMMARY CARCINOMA of the RIGHT axilla. Definitive nodal tissue is not identified. This was found to be concordant by Dr. Hassan Rowan. Pathology revealed BENIGN LYMPH NODE of the LEFT axilla. This was found to be concordant by Dr. Hassan Rowan. Pathology results were discussed with the patient by telephone. The patient reported doing well after the biopsies with tenderness at the sites. Post biopsy instructions and care were reviewed and questions were answered. The patient was encouraged to  call [facility] for any additional concerns. My direct phone number was provided. The patient was referred to [facility] on December 03, 2019. Recommendation for a bilateral breast MRI for further  evaluation of extent of disease and heterogeneously dense breasts. Pathology results reported by Terie Purser, RN on 11/26/2019. Electronically Signed   By: Margarette Canada M.D.   On: 11/26/2019 13:30   Result Date: 11/26/2019 CLINICAL DATA:  59 year old female for tissue sampling of 11 cm RIGHT breast mass, abnormal RIGHT axillary lymph node and abnormal LEFT axillary lymph node. A abnormal RIGHT axillary lymph node was biopsied instead of the slightly irregular RIGHT axillary mass/lymph node due to location/technical difficulties in sampling. EXAM: ULTRASOUND GUIDED RIGHT BREAST CORE NEEDLE BIOPSY ULTRASOUND-GUIDED BIOPSY OF A RIGHT AXILLARY LYMPH NODE ULTRASOUND-GUIDED BIOPSY OF A LEFT AXILLARY LYMPH NODE COMPARISON:  Previous exam(s). PROCEDURE: I met with the patient and we discussed the procedure of ultrasound-guided biopsy, including benefits and alternatives. We discussed the high likelihood of a successful procedure. We discussed the risks of the procedure, including infection, bleeding, tissue injury, clip migration, and inadequate sampling. Informed written consent was given. The usual time-out protocol was performed immediately prior to the procedure. ULTRASOUND-GUIDED RIGHT BREAST CORE NEEDLE BIOPSY (RIBBON CLIP): Lesion quadrant: UPPER-OUTER RIGHT breast Using sterile technique and 1% Lidocaine as local anesthetic, under direct ultrasound visualization, a 12 gauge spring-loaded device was used to perform biopsy of the 11 cm mass within the UPPER-OUTER RIGHT breast using a LATERAL approach. At the conclusion of the procedure a RIBBON tissue marker clip was deployed into the biopsy cavity. Follow up 2 view mammogram was performed and dictated separately. ULTRASOUND-GUIDED CORE BIOPSY OF A RIGHT AXILLARY LYMPH NODE (Q clip): Using sterile technique and 1% Lidocaine as local anesthetic, under direct ultrasound visualization, a 14 gauge spring-loaded device was used to perform biopsy of 1 of the abnormal RIGHT  axillary lymph nodes with cortical thickening using a inferolateral approach. At the conclusion of the procedure a Q tissue marker clip was deployed into the biopsy cavity, with satisfactory placement confirmed sonographically. ULTRASOUND-GUIDED CORE BIOPSY OF A LEFT AXILLARY LYMPH NODE (Q clip): Using sterile technique and 1% Lidocaine as local anesthetic, under direct ultrasound visualization, a 14 gauge spring-loaded device was used to perform biopsy of the abnormal LEFT axillary lymph node with cortical thickening using a MEDIAL approach. At the conclusion of the procedure a Q tissue marker clip was deployed into the biopsy cavity, with satisfactory placement confirmed sonographically. IMPRESSION: Ultrasound guided biopsies of the 11 cm UPPER-OUTER RIGHT breast mass, an abnormal RIGHT axillary lymph node and an abnormal LEFT axillary lymph node. No apparent complications. Electronically Signed: By: Margarette Canada M.D. On: 11/25/2019 14:10    ASSESSMENT & PLAN:  Carolyn Stewart is a 58 y.o. African American female with a history of HTN   1. Malignant neoplasm of upper-outer quadrant of right breast, ductal carcinoma, Stage IIA, c(T3N1M0), ER+/PR+/HER2+, Grade II-III  -We discussed her image findings and the biopsy results in great details. After 2 month of right breast skin change, she had Mammogram and [place] which showed a large right breast mass in or 4 quadrants measuring up to 11cm and right lymphadenopathy as well as indeterminate masses in left breast and one enlarged left axillar LN.  -Her right breast and LN biopsy was positive for invasive ductal carcinoma with components of DCIS, calcifications and necrosis in the breast. Her  left axillary node biopsy was benign.  -I discussed given her multiple indeterminate left breast masses a Breast MRI and likely left breast biopsy is recommended. She is agreeable to proceed.  -Given her Lymph node involvement I also recommend staging CT CAP and Bone scan to  rule out distant metastasis. She is agreeable.  -Given the size of her 11cm mass and LN involvement, Right mastectomy with LN dissection is recommended and the only way to cure her cancer. Possible Left breast and axillary surgery will be based on her MRI and Left breast biopsy. She will discuss surgery with Dr Barry Dienes today. She is interested in breast reconstruction surgery.  -The risk of recurrence depends on the stage and biology of the tumor. She is stage IIA, with ER/PR/HER2 positive markers. I discussed this is more aggressive due to HER2 positive disease. -I discussed her cancer has high risk features including LN involvement and HER2 positive disease which increases her risk of cancer recurrence after surgery.  -Chemotherapy is recommended to reduce her high risk of recurrence and improve her outcome for surgery. I recommend neo-adjuvant TCHP (chemo Docetaxel and Carboplatin with Antibody Herceptin and Perjeta) q3weeks for 6 cycles followed by maintenance Herceptin and Perjeta to complete 1 year treatment. Will monitor her breast mass on neo-adjuvant treatment for clinical response.   --Chemotherapy consent: Side effects including but does not limited to, fatigue, nausea, vomiting, diarrhea, hair loss, neuropathy, fluid retention, renal and kidney dysfunction, neutropenic fever, needed for blood transfusion, bleeding, reversible cardiomyopathy, skin rash, it etc. were discussed with patient in great detail. She agrees to proceed. -The goal of chemotherapy is curative -Will proceed with PAC placement and Chemo education class before the start of treatment. Will monitor her heart function on Herceptin with repeat Echos every 3 months.  -She was also seen by radiation oncologist Dr. Lisbeth Renshaw today. Given positive LNs, post-mastectomy radiation is recommended to reduce her risk of local cancer recurrence.  -Given the strong ER and PR expression in her postmenopausal status, I recommend adjuvant endocrine  therapy with aromatase inhibitor for a total of 7-10 years to reduce the risk of cancer recurrence. Potential benefits and side effects were discussed with patient and she is interested. -Labs reviewed, CBC and CMP WNL except Alk Phos 141. Breast Exam showed 10x8cm right breast mass and movable 1.5cm right axillary LN.  -F/u with start of chemo    2. HTN -Well controlled on Metoprolol and Amlodapine. -Continue to f/u with PCP   PLAN:  -Breast MRI in 1-2 weeks followed by left breast biopsy   -CT CAP w contrast and bone scan in 1-2 weeks  -PAC placement with Dr Barry Dienes in next week -Chemo education class next week -ECHO in 1-2 weeks  -Lab, flush, F/u and Chemo TCHP in about 2 weeks after the above work  -Office will fill out her husband's FMLA    No orders of the defined types were placed in this encounter.   All questions were answered. The patient knows to call the clinic with any problems, questions or concerns. The total time spent in the appointment was 60 minutes.     Truitt Merle, MD 12/03/2019   I, Joslyn Devon, am acting as scribe for Truitt Merle, MD.   I have reviewed the above documentation for accuracy and completeness, and I agree with the above.

## 2019-12-03 ENCOUNTER — Encounter: Payer: Self-pay | Admitting: *Deleted

## 2019-12-03 ENCOUNTER — Other Ambulatory Visit: Payer: Self-pay | Admitting: *Deleted

## 2019-12-03 ENCOUNTER — Other Ambulatory Visit: Payer: Self-pay | Admitting: General Surgery

## 2019-12-03 ENCOUNTER — Other Ambulatory Visit: Payer: Self-pay

## 2019-12-03 ENCOUNTER — Encounter: Payer: Self-pay | Admitting: Hematology

## 2019-12-03 ENCOUNTER — Inpatient Hospital Stay: Payer: Commercial Managed Care - PPO | Attending: Hematology | Admitting: Hematology

## 2019-12-03 ENCOUNTER — Ambulatory Visit: Payer: Commercial Managed Care - PPO | Attending: General Surgery | Admitting: Physical Therapy

## 2019-12-03 ENCOUNTER — Inpatient Hospital Stay: Payer: Commercial Managed Care - PPO

## 2019-12-03 ENCOUNTER — Encounter: Payer: Self-pay | Admitting: Physical Therapy

## 2019-12-03 ENCOUNTER — Encounter: Payer: Self-pay | Admitting: General Practice

## 2019-12-03 ENCOUNTER — Ambulatory Visit
Admission: RE | Admit: 2019-12-03 | Discharge: 2019-12-03 | Disposition: A | Discharge status: Home / Self Care | Payer: Commercial Managed Care - PPO | Source: Ambulatory Visit | Attending: Radiation Oncology | Admitting: Radiation Oncology

## 2019-12-03 VITALS — BP 131/73 | HR 71 | Temp 97.5°F | Resp 18 | Wt 194.4 lb

## 2019-12-03 DIAGNOSIS — Z79899 Other long term (current) drug therapy: Secondary | ICD-10-CM | POA: Diagnosis not present

## 2019-12-03 DIAGNOSIS — C50411 Malignant neoplasm of upper-outer quadrant of right female breast: Secondary | ICD-10-CM

## 2019-12-03 DIAGNOSIS — C773 Secondary and unspecified malignant neoplasm of axilla and upper limb lymph nodes: Secondary | ICD-10-CM | POA: Diagnosis not present

## 2019-12-03 DIAGNOSIS — I10 Essential (primary) hypertension: Secondary | ICD-10-CM | POA: Diagnosis not present

## 2019-12-03 DIAGNOSIS — Z170 Estrogen receptor positive status [ER+]: Secondary | ICD-10-CM | POA: Diagnosis not present

## 2019-12-03 DIAGNOSIS — R293 Abnormal posture: Secondary | ICD-10-CM | POA: Diagnosis present

## 2019-12-03 LAB — CMP (CANCER CENTER ONLY)
ALT: 23 U/L (ref 0–44)
AST: 22 U/L (ref 15–41)
Albumin: 3.9 g/dL (ref 3.5–5.0)
Alkaline Phosphatase: 141 U/L — ABNORMAL HIGH (ref 38–126)
Anion gap: 8 (ref 5–15)
BUN: 16 mg/dL (ref 6–20)
CO2: 26 mmol/L (ref 22–32)
Calcium: 9.6 mg/dL (ref 8.9–10.3)
Chloride: 108 mmol/L (ref 98–111)
Creatinine: 0.88 mg/dL (ref 0.44–1.00)
GFR, Est AFR Am: >60 mL/min (ref >60)
GFR, Estimated: >60 mL/min (ref >60)
Glucose, Bld: 92 mg/dL (ref 70–99)
Potassium: 3.9 mmol/L (ref 3.5–5.1)
Sodium: 142 mmol/L (ref 135–145)
Total Bilirubin: 0.3 mg/dL (ref 0.3–1.2)
Total Protein: 7.6 g/dL (ref 6.5–8.1)

## 2019-12-03 LAB — CBC WITH DIFFERENTIAL (CANCER CENTER ONLY)
Abs Immature Granulocytes: 0.01 10*3/uL (ref 0.00–0.07)
Basophils Absolute: 0 10*3/uL (ref 0.0–0.1)
Basophils Relative: 0 %
Eosinophils Absolute: 0.2 10*3/uL (ref 0.0–0.5)
Eosinophils Relative: 3 %
HCT: 40 % (ref 36.0–46.0)
Hemoglobin: 12.7 g/dL (ref 12.0–15.0)
Immature Granulocytes: 0 %
Lymphocytes Relative: 51 %
Lymphs Abs: 3 10*3/uL (ref 0.7–4.0)
MCH: 28.6 pg (ref 26.0–34.0)
MCHC: 31.8 g/dL (ref 30.0–36.0)
MCV: 90.1 fL (ref 80.0–100.0)
Monocytes Absolute: 0.5 10*3/uL (ref 0.1–1.0)
Monocytes Relative: 8 %
Neutro Abs: 2.2 10*3/uL (ref 1.7–7.7)
Neutrophils Relative %: 38 %
Platelet Count: 330 10*3/uL (ref 150–400)
RBC: 4.44 MIL/uL (ref 3.87–5.11)
RDW: 14.6 % (ref 11.5–15.5)
WBC Count: 5.9 10*3/uL (ref 4.0–10.5)
nRBC: 0 % (ref 0.0–0.2)

## 2019-12-03 LAB — GENETIC SCREENING ORDER

## 2019-12-03 MED ORDER — DEXAMETHASONE 4 MG PO TABS: 8.0000 mg | ORAL_TABLET | Freq: Two times a day (BID) | ORAL | 1 refills | Status: DC

## 2019-12-03 MED ORDER — PROCHLORPERAZINE MALEATE 10 MG PO TABS: 10.0000 mg | ORAL_TABLET | Freq: Four times a day (QID) | ORAL | 1 refills | Status: DC | PRN

## 2019-12-03 MED ORDER — ONDANSETRON HCL 8 MG PO TABS: 8.0000 mg | ORAL_TABLET | Freq: Two times a day (BID) | ORAL | 1 refills | Status: DC | PRN

## 2019-12-03 MED ORDER — LIDOCAINE-PRILOCAINE 2.5-2.5 % EX CREA: TOPICAL_CREAM | CUTANEOUS | 3 refills | Status: DC

## 2019-12-03 NOTE — Progress Notes (Signed)
[facility] Spiritual Care Note  Met with Carolyn Stewart and her husband Carolyn Stewart in [facility], providing empathic listening, emotional support, normalization of feelings, and introduction to [facility] team/programming. Carolyn Stewart is facing her diagnosis head on and is eager to start her plan of care. She reports no other needs or concerns at this time, but knows to contact Team if needs arise or circumstances change.    [place], [place], [facility] Pager [phone] Voicemail [phone]

## 2019-12-03 NOTE — Progress Notes (Addendum)
Radiation Oncology         [phone]) [phone] ________________________________  Name: Carolyn Stewart        MRN: [id number]  Date of Service: 12/03/2019 DOB: 1960-07-10  XH:[id number], Melissa, PA-C  [name], MD     REFERRING PHYSICIAN: [name], MD   DIAGNOSIS: The encounter diagnosis was Malignant neoplasm of upper-outer quadrant of right breast in female, estrogen receptor positive ([facility]).   HISTORY OF PRESENT ILLNESS: Carolyn Stewart is a 59 y.o. female seen in the multidisciplinary breast clinic for a new diagnosis of right breast cancer. The patient was noted to have fullness in the right breast as well as architectural changes in the left breast.  The patient proceeded with diagnostic imaging, and in all quadrants of the breast from 9-12 o'clock specifically the greatest area of concern was noted to measure up to 11 cm, multiple masses were identified within the axilla that were felt to be possible calcifications.  In the left breast there were concerns for multiple masses within the upper inner quadrant and one abnormal appearing lymph node.  The patient underwent biopsy on 11/25/2019.  In the right upper outer quadrant specimen they identified grade 2 invasive ductal carcinoma with associated DCIS that was triple positive.  Her lymph node also confirmed invasive ductal carcinoma that was triple positive though it had a different appearing architecture than the findings in the breast, a left axillary node was also sampled and was benign.  Given these findings she comes today to to discuss treatment recommendations for her cancer.    PREVIOUS RADIATION THERAPY: No   PAST MEDICAL HISTORY:  Past Medical History:  Diagnosis Date  . Hypertension        PAST SURGICAL HISTORY: Past Surgical History:  Procedure Laterality Date  . left parotid tumor removal   2006  . TONSILLECTOMY       FAMILY HISTORY:  Family History  Problem Relation Age of Onset  . Hypertension Mother   .  Hypertension Father      SOCIAL HISTORY:  reports that she has never smoked. She has never used smokeless tobacco. She reports that she does not drink alcohol and does not use drugs.  The patient is married and resides in [place].  She has adult children and works as a Programmer, applications. She's accompanied by her husband.  ALLERGIES: Patient has no known allergies.   MEDICATIONS:  Current Outpatient Medications  Medication Sig Dispense Refill  . amLODipine (NORVASC) 10 MG tablet Take 10 mg by mouth daily.    Marland Kitchen dexamethasone (DECADRON) 4 MG tablet Take 2 tablets (8 mg total) by mouth 2 (two) times daily. Start the day before Taxotere. Then take daily x 3 days after chemotherapy. 30 tablet 1  . lidocaine-prilocaine (EMLA) cream Apply to affected area once 30 g 3  . metoprolol succinate (TOPROL-XL) 50 MG 24 hr tablet Take 50 mg by mouth daily.    . ondansetron (ZOFRAN) 8 MG tablet Take 1 tablet (8 mg total) by mouth 2 (two) times daily as needed (Nausea or vomiting). Start on the third day after chemotherapy. 30 tablet 1  . prochlorperazine (COMPAZINE) 10 MG tablet Take 1 tablet (10 mg total) by mouth every 6 (six) hours as needed (Nausea or vomiting). 30 tablet 1  . Vitamin D3 (VITAMIN D) 25 MCG tablet Take 2,000 Units by mouth daily.     No current facility-administered medications for this encounter.     REVIEW OF SYSTEMS: On review of systems,  the patient reports that she is doing well overall. She reports she is doing well and denies any significant changes in the fullness of her breast. No other complaints are verbalized.      PHYSICAL EXAM:  Wt Readings from Last 3 Encounters:  12/03/19 194 lb 6.4 oz (88.2 kg)   Temp Readings from Last 3 Encounters:  12/03/19 (!) 97.5 F (36.4 C) (Tympanic)  02/08/13 98.1 F (36.7 C) (Oral)   BP Readings from Last 3 Encounters:  12/03/19 131/73  02/08/13 149/92   Pulse Readings from Last 3 Encounters:  12/03/19 71  02/08/13 80     In general this is a well appearing African-American female in no acute distress. She's alert and oriented x4 and appropriate throughout the examination. Cardiopulmonary assessment is negative for acute distress and she exhibits normal effort. Bilateral breast exam is deferred.    ECOG = 1  0 - Asymptomatic (Fully active, able to carry on all predisease activities without restriction)  1 - Symptomatic but completely ambulatory (Restricted in physically strenuous activity but ambulatory and able to carry out work of a light or sedentary nature. For example, light housework, office work)  2 - Symptomatic, <50% in bed during the day (Ambulatory and capable of all self care but unable to carry out any work activities. Up and about more than 50% of waking hours)  3 - Symptomatic, >50% in bed, but not bedbound (Capable of only limited self-care, confined to bed or chair 50% or more of waking hours)  4 - Bedbound (Completely disabled. Cannot carry on any self-care. Totally confined to bed or chair)  5 - Death   Eustace Pen MM, Creech RH, Tormey DC, et al. [phone]). "Toxicity and response criteria of the [facility] Group". [place] Oncol. 5 (6): 649-55    LABORATORY DATA:  Lab Results  Component Value Date   WBC 5.9 12/03/2019   HGB 12.7 12/03/2019   HCT 40.0 12/03/2019   MCV 90.1 12/03/2019   PLT 330 12/03/2019   Lab Results  Component Value Date   NA 142 12/03/2019   K 3.9 12/03/2019   CL 108 12/03/2019   CO2 26 12/03/2019   Lab Results  Component Value Date   ALT 23 12/03/2019   AST 22 12/03/2019   ALKPHOS 141 (H) 12/03/2019   BILITOT 0.3 12/03/2019      RADIOGRAPHY: US BREAST LTD UNI LEFT INC AXILLA  Result Date: 11/13/2019 CLINICAL DATA:  Changes to the left breast. EXAM: DIGITAL DIAGNOSTIC BILATERAL MAMMOGRAM WITH CAD AND TOMO ULTRASOUND BILATERAL BREAST COMPARISON:  None. ACR Breast Density Category c: The breast tissue is heterogeneously dense,  which may obscure small masses. FINDINGS: There is a large area of increased density containing suspicious calcifications with associated distortion centered in the retroareolar region, extending into the upper outer right breast. The abnormality appears to extend slightly medial to midline as well. Mammographically, the abnormality measures up to 7.5 cm. There are multiple masses primarily in the medial superior left breast. No other suspicious mammographic findings are identified. Mammographic images were processed with CAD. On physical exam, the right breast is very firm. Targeted ultrasound is performed, showing a large ill-defined mass containing calcifications in the right breast centered between 9 and 12 o'clock spanning up to 11 cm sonographically. The mass is somewhat ill-defined. There are multiple masses in the right axilla. One of the masses contains calcifications, denoted as mass number 2 measuring 9 x 11 by 10 mm. Several of  these masses do not appear to represent definitive lymph nodes. Others may be small but abnormal appearing lymph nodes. Taken in total, a cluster of masses in the right axilla spans up to 3.7 cm. There are fibrocystic changes on the left. There are also some solid-appearing masses. A solid appearing mass at 10:30, 6 cm from the nipple measures 9 x 6 x 6 mm. An adjacent solid mass at 10 o'clock, 6 cm from the nipple measures 8 x 6 by 5 mm. Another solid-appearing mass at 11 o'clock, 1 cm from the nipple measures 5 x 4 by 5 mm. Fibrocystic changes are seen at 10 o'clock, 12 o'clock, and 4 o'clock. Another solid-appearing mass seen at 5 o'clock, 4 cm from the nipple measuring 6 x 3 x 4 mm. There is a single abnormal node in the left axilla with a cortex measuring 5 mm and restriction of the fatty hilum. IMPRESSION: Highly suspicious mass and calcifications in the right breast as above. Multiple masses in the right axilla as above. While some of these masses may represent small but  abnormal lymph nodes, others do not have a typical lymph node appearance. The most suspicious mass in the right axilla contains calcifications, denoted as mass 2. There are numerous masses in the left breast, several of which are solid as above. Fibrocystic changes are seen as well. There is an abnormal left axillary lymph node. RECOMMENDATION: Recommend biopsying 2 sites within the large dominant 11 cm ill-defined mass containing calcifications in the right breast utilizing ultrasound guidance. 2 biopsies are necessary to prove extent of disease. Recommend biopsying the mass in the right axilla containing calcifications described above as mass 2. Recommend biopsy of the abnormal left axillary lymph node. I suspect the biopsy will demonstrate malignancy on the right. Once malignancy on the right is confirmed, recommend breast MRI to further assess the left breast. If there is a particularly suspicious mass in the left breast on MRI, recommend MRI guided biopsy of that mass. If the masses are all similar in appearance without suspicious features, recommend ultrasound-guided biopsy of the largest left breast mass. I have discussed the findings and recommendations with the patient. If applicable, a reminder letter will be sent to the patient regarding the next appointment. BI-RADS CATEGORY  5: Highly suggestive of malignancy. Electronically Signed   By: Dorise Bullion III M.D   On: 11/13/2019 13:02   US BREAST LTD UNI RIGHT INC AXILLA  Result Date: 11/13/2019 CLINICAL DATA:  Changes to the left breast. EXAM: DIGITAL DIAGNOSTIC BILATERAL MAMMOGRAM WITH CAD AND TOMO ULTRASOUND BILATERAL BREAST COMPARISON:  None. ACR Breast Density Category c: The breast tissue is heterogeneously dense, which may obscure small masses. FINDINGS: There is a large area of increased density containing suspicious calcifications with associated distortion centered in the retroareolar region, extending into the upper outer right breast. The  abnormality appears to extend slightly medial to midline as well. Mammographically, the abnormality measures up to 7.5 cm. There are multiple masses primarily in the medial superior left breast. No other suspicious mammographic findings are identified. Mammographic images were processed with CAD. On physical exam, the right breast is very firm. Targeted ultrasound is performed, showing a large ill-defined mass containing calcifications in the right breast centered between 9 and 12 o'clock spanning up to 11 cm sonographically. The mass is somewhat ill-defined. There are multiple masses in the right axilla. One of the masses contains calcifications, denoted as mass number 2 measuring 9 x 11 by 10 mm.  Several of these masses do not appear to represent definitive lymph nodes. Others may be small but abnormal appearing lymph nodes. Taken in total, a cluster of masses in the right axilla spans up to 3.7 cm. There are fibrocystic changes on the left. There are also some solid-appearing masses. A solid appearing mass at 10:30, 6 cm from the nipple measures 9 x 6 x 6 mm. An adjacent solid mass at 10 o'clock, 6 cm from the nipple measures 8 x 6 by 5 mm. Another solid-appearing mass at 11 o'clock, 1 cm from the nipple measures 5 x 4 by 5 mm. Fibrocystic changes are seen at 10 o'clock, 12 o'clock, and 4 o'clock. Another solid-appearing mass seen at 5 o'clock, 4 cm from the nipple measuring 6 x 3 x 4 mm. There is a single abnormal node in the left axilla with a cortex measuring 5 mm and restriction of the fatty hilum. IMPRESSION: Highly suspicious mass and calcifications in the right breast as above. Multiple masses in the right axilla as above. While some of these masses may represent small but abnormal lymph nodes, others do not have a typical lymph node appearance. The most suspicious mass in the right axilla contains calcifications, denoted as mass 2. There are numerous masses in the left breast, several of which are solid  as above. Fibrocystic changes are seen as well. There is an abnormal left axillary lymph node. RECOMMENDATION: Recommend biopsying 2 sites within the large dominant 11 cm ill-defined mass containing calcifications in the right breast utilizing ultrasound guidance. 2 biopsies are necessary to prove extent of disease. Recommend biopsying the mass in the right axilla containing calcifications described above as mass 2. Recommend biopsy of the abnormal left axillary lymph node. I suspect the biopsy will demonstrate malignancy on the right. Once malignancy on the right is confirmed, recommend breast MRI to further assess the left breast. If there is a particularly suspicious mass in the left breast on MRI, recommend MRI guided biopsy of that mass. If the masses are all similar in appearance without suspicious features, recommend ultrasound-guided biopsy of the largest left breast mass. I have discussed the findings and recommendations with the patient. If applicable, a reminder letter will be sent to the patient regarding the next appointment. BI-RADS CATEGORY  5: Highly suggestive of malignancy. Electronically Signed   By: Dorise Bullion III M.D   On: 11/13/2019 13:02   MM DIAG BREAST TOMO BILATERAL  Result Date: 11/13/2019 CLINICAL DATA:  Changes to the left breast. EXAM: DIGITAL DIAGNOSTIC BILATERAL MAMMOGRAM WITH CAD AND TOMO ULTRASOUND BILATERAL BREAST COMPARISON:  None. ACR Breast Density Category c: The breast tissue is heterogeneously dense, which may obscure small masses. FINDINGS: There is a large area of increased density containing suspicious calcifications with associated distortion centered in the retroareolar region, extending into the upper outer right breast. The abnormality appears to extend slightly medial to midline as well. Mammographically, the abnormality measures up to 7.5 cm. There are multiple masses primarily in the medial superior left breast. No other suspicious mammographic findings are  identified. Mammographic images were processed with CAD. On physical exam, the right breast is very firm. Targeted ultrasound is performed, showing a large ill-defined mass containing calcifications in the right breast centered between 9 and 12 o'clock spanning up to 11 cm sonographically. The mass is somewhat ill-defined. There are multiple masses in the right axilla. One of the masses contains calcifications, denoted as mass number 2 measuring 9 x 11 by 10 mm.  Several of these masses do not appear to represent definitive lymph nodes. Others may be small but abnormal appearing lymph nodes. Taken in total, a cluster of masses in the right axilla spans up to 3.7 cm. There are fibrocystic changes on the left. There are also some solid-appearing masses. A solid appearing mass at 10:30, 6 cm from the nipple measures 9 x 6 x 6 mm. An adjacent solid mass at 10 o'clock, 6 cm from the nipple measures 8 x 6 by 5 mm. Another solid-appearing mass at 11 o'clock, 1 cm from the nipple measures 5 x 4 by 5 mm. Fibrocystic changes are seen at 10 o'clock, 12 o'clock, and 4 o'clock. Another solid-appearing mass seen at 5 o'clock, 4 cm from the nipple measuring 6 x 3 x 4 mm. There is a single abnormal node in the left axilla with a cortex measuring 5 mm and restriction of the fatty hilum. IMPRESSION: Highly suspicious mass and calcifications in the right breast as above. Multiple masses in the right axilla as above. While some of these masses may represent small but abnormal lymph nodes, others do not have a typical lymph node appearance. The most suspicious mass in the right axilla contains calcifications, denoted as mass 2. There are numerous masses in the left breast, several of which are solid as above. Fibrocystic changes are seen as well. There is an abnormal left axillary lymph node. RECOMMENDATION: Recommend biopsying 2 sites within the large dominant 11 cm ill-defined mass containing calcifications in the right breast  utilizing ultrasound guidance. 2 biopsies are necessary to prove extent of disease. Recommend biopsying the mass in the right axilla containing calcifications described above as mass 2. Recommend biopsy of the abnormal left axillary lymph node. I suspect the biopsy will demonstrate malignancy on the right. Once malignancy on the right is confirmed, recommend breast MRI to further assess the left breast. If there is a particularly suspicious mass in the left breast on MRI, recommend MRI guided biopsy of that mass. If the masses are all similar in appearance without suspicious features, recommend ultrasound-guided biopsy of the largest left breast mass. I have discussed the findings and recommendations with the patient. If applicable, a reminder letter will be sent to the patient regarding the next appointment. BI-RADS CATEGORY  5: Highly suggestive of malignancy. Electronically Signed   By: Dorise Bullion III M.D   On: 11/13/2019 13:02   [place] AXILLARY NODE CORE BIOPSY LEFT  Addendum Date: 11/26/2019   ADDENDUM REPORT: 11/26/2019 13:30 ADDENDUM: Pathology revealed GRADE II INVASIVE MAMMARY CARCINOMA, MAMMARY CARCINOMA IN SITU WITH CALCIFICATIONS AND NECROSIS of the RIGHT breast, upper outer. This was found to be concordant by Dr. Hassan Rowan. Pathology revealed INVASIVE MAMMARY CARCINOMA of the RIGHT axilla. Definitive nodal tissue is not identified. This was found to be concordant by Dr. Hassan Rowan. Pathology revealed BENIGN LYMPH NODE of the LEFT axilla. This was found to be concordant by Dr. Hassan Rowan. Pathology results were discussed with the patient by telephone. The patient reported doing well after the biopsies with tenderness at the sites. Post biopsy instructions and care were reviewed and questions were answered. The patient was encouraged to call [facility] for any additional concerns. My direct phone number was provided. The patient was referred to [facility] on December 03, 2019. Recommendation for a bilateral breast MRI for further evaluation of extent of disease and heterogeneously dense breasts.  Pathology results reported by Terie Purser, RN on 11/26/2019. Electronically Signed   By: Margarette Canada M.D.   On: 11/26/2019 13:30   Result Date: 11/26/2019 CLINICAL DATA:  59 year old female for tissue sampling of 11 cm RIGHT breast mass, abnormal RIGHT axillary lymph node and abnormal LEFT axillary lymph node. A abnormal RIGHT axillary lymph node was biopsied instead of the slightly irregular RIGHT axillary mass/lymph node due to location/technical difficulties in sampling. EXAM: ULTRASOUND GUIDED RIGHT BREAST CORE NEEDLE BIOPSY ULTRASOUND-GUIDED BIOPSY OF A RIGHT AXILLARY LYMPH NODE ULTRASOUND-GUIDED BIOPSY OF A LEFT AXILLARY LYMPH NODE COMPARISON:  Previous exam(s). PROCEDURE: I met with the patient and we discussed the procedure of ultrasound-guided biopsy, including benefits and alternatives. We discussed the high likelihood of a successful procedure. We discussed the risks of the procedure, including infection, bleeding, tissue injury, clip migration, and inadequate sampling. Informed written consent was given. The usual time-out protocol was performed immediately prior to the procedure. ULTRASOUND-GUIDED RIGHT BREAST CORE NEEDLE BIOPSY (RIBBON CLIP): Lesion quadrant: UPPER-OUTER RIGHT breast Using sterile technique and 1% Lidocaine as local anesthetic, under direct ultrasound visualization, a 12 gauge spring-loaded device was used to perform biopsy of the 11 cm mass within the UPPER-OUTER RIGHT breast using a LATERAL approach. At the conclusion of the procedure a RIBBON tissue marker clip was deployed into the biopsy cavity. Follow up 2 view mammogram was performed and dictated separately. ULTRASOUND-GUIDED CORE BIOPSY OF A RIGHT AXILLARY LYMPH NODE (Q clip): Using sterile technique and 1% Lidocaine as local  anesthetic, under direct ultrasound visualization, a 14 gauge spring-loaded device was used to perform biopsy of 1 of the abnormal RIGHT axillary lymph nodes with cortical thickening using a inferolateral approach. At the conclusion of the procedure a Q tissue marker clip was deployed into the biopsy cavity, with satisfactory placement confirmed sonographically. ULTRASOUND-GUIDED CORE BIOPSY OF A LEFT AXILLARY LYMPH NODE (Q clip): Using sterile technique and 1% Lidocaine as local anesthetic, under direct ultrasound visualization, a 14 gauge spring-loaded device was used to perform biopsy of the abnormal LEFT axillary lymph node with cortical thickening using a MEDIAL approach. At the conclusion of the procedure a Q tissue marker clip was deployed into the biopsy cavity, with satisfactory placement confirmed sonographically. IMPRESSION: Ultrasound guided biopsies of the 11 cm UPPER-OUTER RIGHT breast mass, an abnormal RIGHT axillary lymph node and an abnormal LEFT axillary lymph node. No apparent complications. Electronically Signed: By: Margarette Canada M.D. On: 11/25/2019 14:10   [place] AXILLARY NODE CORE BIOPSY RIGHT  Addendum Date: 11/26/2019   ADDENDUM REPORT: 11/26/2019 13:30 ADDENDUM: Pathology revealed GRADE II INVASIVE MAMMARY CARCINOMA, MAMMARY CARCINOMA IN SITU WITH CALCIFICATIONS AND NECROSIS of the RIGHT breast, upper outer. This was found to be concordant by Dr. Hassan Rowan. Pathology revealed INVASIVE MAMMARY CARCINOMA of the RIGHT axilla. Definitive nodal tissue is not identified. This was found to be concordant by Dr. Hassan Rowan. Pathology revealed BENIGN LYMPH NODE of the LEFT axilla. This was found to be concordant by Dr. Hassan Rowan. Pathology results were discussed with the patient by telephone. The patient reported doing well after the biopsies with tenderness at the sites. Post biopsy instructions and care were reviewed and questions were answered. The patient was encouraged to call [facility] for any additional concerns. My direct phone number was provided. The patient was referred to [facility] at [facility] on December 03, 2019. Recommendation for a bilateral breast MRI for  further evaluation of extent of disease and heterogeneously dense breasts. Pathology results reported by Terie Purser, RN on 11/26/2019. Electronically Signed   By: Margarette Canada M.D.   On: 11/26/2019 13:30   Result Date: 11/26/2019 CLINICAL DATA:  59 year old female for tissue sampling of 11 cm RIGHT breast mass, abnormal RIGHT axillary lymph node and abnormal LEFT axillary lymph node. A abnormal RIGHT axillary lymph node was biopsied instead of the slightly irregular RIGHT axillary mass/lymph node due to location/technical difficulties in sampling. EXAM: ULTRASOUND GUIDED RIGHT BREAST CORE NEEDLE BIOPSY ULTRASOUND-GUIDED BIOPSY OF A RIGHT AXILLARY LYMPH NODE ULTRASOUND-GUIDED BIOPSY OF A LEFT AXILLARY LYMPH NODE COMPARISON:  Previous exam(s). PROCEDURE: I met with the patient and we discussed the procedure of ultrasound-guided biopsy, including benefits and alternatives. We discussed the high likelihood of a successful procedure. We discussed the risks of the procedure, including infection, bleeding, tissue injury, clip migration, and inadequate sampling. Informed written consent was given. The usual time-out protocol was performed immediately prior to the procedure. ULTRASOUND-GUIDED RIGHT BREAST CORE NEEDLE BIOPSY (RIBBON CLIP): Lesion quadrant: UPPER-OUTER RIGHT breast Using sterile technique and 1% Lidocaine as local anesthetic, under direct ultrasound visualization, a 12 gauge spring-loaded device was used to perform biopsy of the 11 cm mass within the UPPER-OUTER RIGHT breast using a LATERAL approach. At the conclusion of the procedure a RIBBON tissue marker clip was deployed into the biopsy cavity. Follow up 2 view mammogram was performed and  dictated separately. ULTRASOUND-GUIDED CORE BIOPSY OF A RIGHT AXILLARY LYMPH NODE (Q clip): Using sterile technique and 1% Lidocaine as local anesthetic, under direct ultrasound visualization, a 14 gauge spring-loaded device was used to perform biopsy of 1 of the abnormal RIGHT axillary lymph nodes with cortical thickening using a inferolateral approach. At the conclusion of the procedure a Q tissue marker clip was deployed into the biopsy cavity, with satisfactory placement confirmed sonographically. ULTRASOUND-GUIDED CORE BIOPSY OF A LEFT AXILLARY LYMPH NODE (Q clip): Using sterile technique and 1% Lidocaine as local anesthetic, under direct ultrasound visualization, a 14 gauge spring-loaded device was used to perform biopsy of the abnormal LEFT axillary lymph node with cortical thickening using a MEDIAL approach. At the conclusion of the procedure a Q tissue marker clip was deployed into the biopsy cavity, with satisfactory placement confirmed sonographically. IMPRESSION: Ultrasound guided biopsies of the 11 cm UPPER-OUTER RIGHT breast mass, an abnormal RIGHT axillary lymph node and an abnormal LEFT axillary lymph node. No apparent complications. Electronically Signed: By: Margarette Canada M.D. On: 11/25/2019 14:10   MM CLIP PLACEMENT RIGHT  Result Date: 11/25/2019 CLINICAL DATA:  Evaluate RIBBON biopsy clip placement following ultrasound-guided RIGHT breast biopsy. Evaluate Q biopsy clip placement following ultrasound-guided RIGHT axillary lymph node biopsy. EXAM: 3D DIAGNOSTIC RIGHT MAMMOGRAM POST ULTRASOUND BIOPSY COMPARISON:  Previous exam(s). FINDINGS: 3D Mammographic images were obtained following ultrasound guided biopsies of a UPPER-OUTER RIGHT breast mass and an abnormal RIGHT axillary lymph node. The RIBBON biopsy marking clip is in expected position at the site of biopsy within the UPPER-OUTER RETROAREOLAR RIGHT breast. The Q shaped biopsy marking clip is in expected position at the site of biopsy of a  RIGHT axillary lymph node. IMPRESSION: Appropriate positioning of the RIBBON shaped biopsy marking clip at the site of biopsy in the UPPER-OUTER RETROAREOLAR RIGHT breast. Appropriate positioning of the Q shaped biopsy marking clip at the site of biopsy in the RIGHT axilla. Please note the due to the location of the biopsied LEFT axillary lymph node, a LEFT mammogram  was not performed. The Q clip was noted to be in satisfactory position sonographically after the biopsy. Final Assessment: Post Procedure Mammograms for Marker Placement Electronically Signed   By: Margarette Canada M.D.   On: 11/25/2019 14:30   [place] RT BREAST BX W LOC DEV 1ST LESION IMG BX SPEC US GUIDE  Addendum Date: 11/26/2019   ADDENDUM REPORT: 11/26/2019 13:30 ADDENDUM: Pathology revealed GRADE II INVASIVE MAMMARY CARCINOMA, MAMMARY CARCINOMA IN SITU WITH CALCIFICATIONS AND NECROSIS of the RIGHT breast, upper outer. This was found to be concordant by Dr. Hassan Rowan. Pathology revealed INVASIVE MAMMARY CARCINOMA of the RIGHT axilla. Definitive nodal tissue is not identified. This was found to be concordant by Dr. Hassan Rowan. Pathology revealed BENIGN LYMPH NODE of the LEFT axilla. This was found to be concordant by Dr. Hassan Rowan. Pathology results were discussed with the patient by telephone. The patient reported doing well after the biopsies with tenderness at the sites. Post biopsy instructions and care were reviewed and questions were answered. The patient was encouraged to call [facility] for any additional concerns. My direct phone number was provided. The patient was referred to [facility] on December 03, 2019. Recommendation for a bilateral breast MRI for further evaluation of extent of disease and heterogeneously dense breasts. Pathology results reported by Terie Purser, RN on 11/26/2019. Electronically Signed   By: Margarette Canada M.D.   On: 11/26/2019  13:30   Result Date: 11/26/2019 CLINICAL DATA:  59 year old female for tissue sampling of 11 cm RIGHT breast mass, abnormal RIGHT axillary lymph node and abnormal LEFT axillary lymph node. A abnormal RIGHT axillary lymph node was biopsied instead of the slightly irregular RIGHT axillary mass/lymph node due to location/technical difficulties in sampling. EXAM: ULTRASOUND GUIDED RIGHT BREAST CORE NEEDLE BIOPSY ULTRASOUND-GUIDED BIOPSY OF A RIGHT AXILLARY LYMPH NODE ULTRASOUND-GUIDED BIOPSY OF A LEFT AXILLARY LYMPH NODE COMPARISON:  Previous exam(s). PROCEDURE: I met with the patient and we discussed the procedure of ultrasound-guided biopsy, including benefits and alternatives. We discussed the high likelihood of a successful procedure. We discussed the risks of the procedure, including infection, bleeding, tissue injury, clip migration, and inadequate sampling. Informed written consent was given. The usual time-out protocol was performed immediately prior to the procedure. ULTRASOUND-GUIDED RIGHT BREAST CORE NEEDLE BIOPSY (RIBBON CLIP): Lesion quadrant: UPPER-OUTER RIGHT breast Using sterile technique and 1% Lidocaine as local anesthetic, under direct ultrasound visualization, a 12 gauge spring-loaded device was used to perform biopsy of the 11 cm mass within the UPPER-OUTER RIGHT breast using a LATERAL approach. At the conclusion of the procedure a RIBBON tissue marker clip was deployed into the biopsy cavity. Follow up 2 view mammogram was performed and dictated separately. ULTRASOUND-GUIDED CORE BIOPSY OF A RIGHT AXILLARY LYMPH NODE (Q clip): Using sterile technique and 1% Lidocaine as local anesthetic, under direct ultrasound visualization, a 14 gauge spring-loaded device was used to perform biopsy of 1 of the abnormal RIGHT axillary lymph nodes with cortical thickening using a inferolateral approach. At the conclusion of the procedure a Q tissue marker clip was deployed into the biopsy cavity, with satisfactory  placement confirmed sonographically. ULTRASOUND-GUIDED CORE BIOPSY OF A LEFT AXILLARY LYMPH NODE (Q clip): Using sterile technique and 1% Lidocaine as local anesthetic, under direct ultrasound visualization, a 14 gauge spring-loaded device was used to perform biopsy of the abnormal LEFT axillary lymph node with cortical thickening using a MEDIAL approach. At the conclusion  of the procedure a Q tissue marker clip was deployed into the biopsy cavity, with satisfactory placement confirmed sonographically. IMPRESSION: Ultrasound guided biopsies of the 11 cm UPPER-OUTER RIGHT breast mass, an abnormal RIGHT axillary lymph node and an abnormal LEFT axillary lymph node. No apparent complications. Electronically Signed: By: Margarette Canada M.D. On: 11/25/2019 14:10       IMPRESSION/PLAN: 1. Stage IB, cT1cN1M0, grade 2, triple positive invasive ductal carcinoma of the right breast. Dr. Lisbeth Renshaw discusses the pathology findings and reviews the nature of triple positive breast disease. The consensus from the breast conference includes proceeding with neoadjuvant chemotherapy, to then determine approach for surgery.  Dr. Barry Dienes anticipates a role however for mastectomy given the extent of her disease currently.  With her node positivity, Dr. Lisbeth Renshaw would recommend a course of postmastectomy radiotherapy to the chest wall and regional lymph nodes on the right.  She would also be offered adjuvant antiestrogen therapy. We discussed the risks, benefits, short, and long term effects of radiotherapy, and the patient is interested in proceeding. Dr. Lisbeth Renshaw discusses the delivery and logistics of radiotherapy and anticipates a course of 6 1/2 weeks of radiotherapy to the chest wall and regional nodes. We will see her back about 2 weeks after surgery to discuss the simulation process and anticipate we starting radiotherapy about 4-6 weeks after surgery.    In a visit lasting 45 minutes, greater than 50% of the time was spent face to face  reviewing her case, as well as in preparation of, discussing, and coordinating the patient's care.  The above documentation reflects my direct findings during this shared patient visit. Please see the separate note by Dr. Lisbeth Renshaw on this date for the remainder of the patient's plan of care.    Carola Rhine, PAC

## 2019-12-03 NOTE — Progress Notes (Signed)
START ON PATHWAY REGIMEN - Breast     A cycle is every 21 days:     Pertuzumab      Pertuzumab      Trastuzumab-xxxx      Trastuzumab-xxxx      Carboplatin      Docetaxel   **Always confirm dose/schedule in your pharmacy ordering system**  Patient Characteristics: Preoperative or Nonsurgical Candidate (Clinical Staging), Neoadjuvant Therapy followed by Surgery, Invasive Disease, Chemotherapy, HER2 Positive, ER Positive Therapeutic Status: Preoperative or Nonsurgical Candidate (Clinical Staging) AJCC M Category: cM0 AJCC Grade: G2 Breast Surgical Plan: Neoadjuvant Therapy followed by Surgery ER Status: Positive (+) AJCC 8 Stage Grouping: IIA HER2 Status: Positive (+) AJCC T Category: cT3 AJCC N Category: cN1 PR Status: Positive (+) Intent of Therapy: Curative Intent, Discussed with Patient

## 2019-12-03 NOTE — Therapy (Signed)
[place], [place], [address] Phone: [phone]   Fax:  [phone]  Physical Therapy Evaluation  Patient Details  Name: Carolyn Stewart MRN: [id number] Date of Birth: [birth year] Referring Provider (PT): Dr. [name]   Encounter Date: 12/03/2019   PT End of Session - 12/03/19 1529    Visit Number 1    Number of Visits 2    Date for PT Re-Evaluation 06/04/20    PT Start Time 1416    PT Stop Time 1441    PT Time Calculation (min) 25 min    Activity Tolerance Patient tolerated treatment well    Behavior During Therapy [facility] for tasks assessed/performed           Past Medical History:  Diagnosis Date  . Hypertension     Past Surgical History:  Procedure Laterality Date  . left parotid tumor removal   2006  . TONSILLECTOMY      There were no vitals filed for this visit.    Subjective Assessment - 12/03/19 1449    Subjective Patient reports she is here today to be seen by her medical team for her newly diagnosed left breast cancer.    Patient is accompained by: Family member    Pertinent History Patient was diagnosed on 11/13/2019 with right triple positive grade II invasive ductal carcinoma breast cancer. She has an 11 cm mass in all 4 quadrants and multiple masses (not yet biopsied) in her left breast. Her Ki67 is 30%.There are suspicious areas in the right axilla and 1 biopsied positive node.    Patient Stated Goals Reduce lymphedema risk and learn post op shoulder ROM HEP    Currently in Pain? No/denies              [facility] PT Assessment - 12/03/19 0001      Assessment   Medical Diagnosis Right breast cancer    Referring Provider (PT) Dr. [name]    Onset Date/Surgical Date 11/13/19    Hand Dominance Right    Prior Therapy none      Precautions   Precautions Other (comment)    Precaution Comments active cancer      Restrictions   Weight Bearing Restrictions No       Balance Screen   Has the patient fallen in the past 6 months No    Has the patient had a decrease in activity level because of a fear of falling?  No    Is the patient reluctant to leave their home because of a fear of falling?  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    Leisure She has a home gym but does not use it      Cognition   Overall Cognitive Status Within Functional Limits for tasks assessed      Posture/Postural Control   Posture/Postural Control Postural limitations    Postural Limitations Rounded Shoulders;Forward head      ROM / Strength   AROM / PROM / Strength AROM;Strength      AROM   Overall AROM Comments Cervical AROM is WNL    AROM Assessment Site Shoulder    Right/Left Shoulder Right;Left  Right Shoulder Extension 53 Degrees    Right Shoulder Flexion 150 Degrees    Right Shoulder ABduction 153 Degrees    Right Shoulder Internal Rotation 62 Degrees    Right Shoulder External Rotation 82 Degrees    Left Shoulder Extension 55 Degrees    Left Shoulder Flexion 135 Degrees    Left Shoulder ABduction 163 Degrees    Left Shoulder Internal Rotation 68 Degrees    Left Shoulder External Rotation 73 Degrees      Strength   Overall Strength Within functional limits for tasks performed             LYMPHEDEMA/ONCOLOGY QUESTIONNAIRE - 12/03/19 0001      Type   Cancer Type Right breast cancer      Lymphedema Assessments   Lymphedema Assessments Upper extremities      Right Upper Extremity Lymphedema   10 cm Proximal to Olecranon Process 30.5 cm    Olecranon Process 25.4 cm    10 cm Proximal to Ulnar Styloid Process 21.5 cm    Just Proximal to Ulnar Styloid Process 15.7 cm    Across Hand at [employer] 18.1 cm     At [place] of 2nd Digit 5.6 cm      Left Upper Extremity Lymphedema   10 cm Proximal to Olecranon Process 30.5 cm    Olecranon Process 25.1 cm    10 cm Proximal to Ulnar Styloid Process 20.9 cm    Just Proximal to Ulnar Styloid Process 15 cm    Across Hand at [employer] 18 cm    At [place] of 2nd Digit 5.6 cm           L-DEX FLOWSHEETS - 12/03/19 1500      L-DEX LYMPHEDEMA SCREENING   Measurement Type Unilateral    L-DEX MEASUREMENT EXTREMITY Upper Extremity    POSITION  Standing    DOMINANT SIDE Right    At Risk Side Right    BASELINE SCORE (UNILATERAL) 1.6          The patient was assessed using the L-Dex machine today to produce a lymphedema index baseline score. The patient will be reassessed on a regular basis (typically every 3 months) to obtain new L-Dex scores. If the score is > 6.5 points away from his/her baseline score indicating onset of subclinical lymphedema, it will be recommended to wear a compression garment for 4 weeks, 12 hours per day and then be reassessed. If the score continues to be > 6.5 points from baseline at reassessment, we will initiate lymphedema treatment. Assessing in this manner has a 95% rate of preventing clinically significant lymphedema.       Katina Dung - 12/03/19 0001    Open a tight or new jar Mild difficulty    Do heavy household chores (wash walls, wash floors) No difficulty    Carry a shopping bag or briefcase No difficulty    Wash your back No difficulty    Use a knife to cut food No difficulty    Recreational activities in which you take some force or impact through your arm, shoulder, or hand (golf, hammering, tennis) Mild difficulty    During the past week, to what extent has your arm, shoulder or hand problem interfered with your normal social activities with family, friends, neighbors, or groups? Slightly    During the past week, to what extent has your arm, shoulder or hand problem limited your work or other regular daily  activities Not  at all    Arm, shoulder, or hand pain. None    Tingling (pins and needles) in your arm, shoulder, or hand None    Difficulty Sleeping No difficulty    DASH Score 6.82 %            Objective measurements completed on examination: See above findings.         Patient was instructed today in a home exercise program today for post op shoulder range of motion. These included active assist shoulder flexion in sitting, scapular retraction, wall walking with shoulder abduction, and hands behind head external rotation.  She was encouraged to do these twice a day, holding 3 seconds and repeating 5 times when permitted by her physician.          PT Education - 12/03/19 1526    Education Details Lymphedema risk reduction and post op shoulder ROM HEP    Person(s) Educated Patient;Spouse    Methods Explanation;Demonstration;Handout    Comprehension Returned demonstration;Verbalized understanding               PT Long Term Goals - 12/03/19 1533      PT LONG TERM GOAL #1   Title Patient will demonstrate she has regained full shoulder ROM and function post operatively compared to baselines.    Time 6    Period Months    Status New    Target Date 06/04/20           Breast Clinic Goals - 12/03/19 1533      Patient will be able to verbalize understanding of pertinent lymphedema risk reduction practices relevant to her diagnosis specifically related to skin care.   Time 1    Period Days    Status Achieved      Patient will be able to return demonstrate and/or verbalize understanding of the post-op home exercise program related to regaining shoulder range of motion.   Time 1    Period Days    Status Achieved      Patient will be able to verbalize understanding of the importance of attending the postoperative After Breast Cancer Class for further lymphedema risk reduction education and therapeutic exercise.   Time 1    Period Days    Status Achieved                  Plan - 12/03/19 1529    Clinical Impression Statement Patient was diagnosed on 11/13/2019 with right triple positive grade II invasive ductal carcinoma breast cancer. She has an 11 cm mass in all 4 quadrants and multiple masses (not yet biopsied) in her left breast. Her Ki67 is 30%.There are suspicious areas in the right axilla and 1 biopsied positive node. Her multidisciplinary medical team met prior to her assessments to determine a recommended treatment plan. She is planning to have neoadjuvant chemotherapy followed by a right modified radical mastectomy, radiation, and anti-estrogen therapy. She may need treatment for her left breast but biopsies are not yet scheduled. She will benefit form a post op PT reassessment to determine needs and from L-Dex screens every 3 months for 2 years to detect subclinical lymphedema.    Stability/Clinical Decision Making Stable/Uncomplicated    Clinical Decision Making Low    Rehab Potential Excellent    PT Frequency --   Eval and 1 f/u visit   PT Treatment/Interventions ADLs/Self Care Home Management;Therapeutic exercise;Patient/family education    PT Next Visit Plan Will reassess 3-4 weeks post op to determine needs  PT Home Exercise Plan Post op shoulder ROM HEP    Consulted and Agree with Plan of Care Patient;Family member/caregiver    Family Member Consulted Husband           Patient will benefit from skilled therapeutic intervention in order to improve the following deficits and impairments:  Decreased knowledge of precautions, Decreased range of motion, Postural dysfunction, Impaired UE functional use, Pain  Visit Diagnosis: Malignant neoplasm of upper-outer quadrant of right breast in female, estrogen receptor positive ([place]) - Plan: PT plan of care cert/re-cert  Abnormal posture - Plan: PT plan of care cert/re-cert   Patient will follow up at outpatient cancer rehab 3-4 weeks following surgery.  If the patient requires physical  therapy at that time, a specific plan will be dictated and sent to the referring physician for approval. The patient was educated today on appropriate basic range of motion exercises to begin post operatively and the importance of attending the After Breast Cancer class following surgery.  Patient was educated today on lymphedema risk reduction practices as it pertains to recommendations that will benefit the patient immediately following surgery.  She verbalized good understanding.      Problem List Patient Active Problem List   Diagnosis Date Noted  . Malignant neoplasm of upper-outer quadrant of right breast in female, estrogen receptor positive ([place]) 12/01/2019   Annia Friendly, PT 12/03/19 3:47 PM  [place], [place], [address] Phone: [phone]   Fax:  [phone]  Name: Carolyn Stewart MRN: [id number] Date of Birth: August 18, 1960

## 2019-12-03 NOTE — Patient Instructions (Signed)

## 2019-12-04 ENCOUNTER — Telehealth: Payer: Self-pay | Admitting: *Deleted

## 2019-12-04 NOTE — Telephone Encounter (Signed)
Left message with appointments for her echo and chemo class on 8/23.

## 2019-12-04 NOTE — Telephone Encounter (Signed)
error 

## 2019-12-05 ENCOUNTER — Telehealth: Payer: Self-pay | Admitting: Hematology

## 2019-12-05 NOTE — Telephone Encounter (Signed)
Scheduled appt per 8/12 sch msg - pt is aware chemo to start on 8/25

## 2019-12-05 NOTE — Telephone Encounter (Signed)
No 8/11 los 

## 2019-12-08 NOTE — Pre-Procedure Instructions (Signed)
[id number] [place], [place] [address] [place] [address] Phone: [phone] Fax: [phone]      Your procedure is scheduled on Tuesday August 24th.  Report to [facility] Main Entrance "A" at 9:00 A.M., and check in at the Admitting office.  Call this number if you have problems the morning of surgery:  [phone]  Call [phone] if you have any questions prior to your surgery date Monday-Friday 8am-4pm    Remember:  Do not eat after midnight the night before your surgery  You may drink clear liquids until 8:00 the morning of your surgery.   Clear liquids allowed are: Water, Non-Citrus Juices (without pulp), Carbonated Beverages, Clear Tea, Black Coffee Only, and Gatorade Patient Instructions   The night before surgery:  o No food after midnight. ONLY clear liquids after midnight   The day of surgery (if you do NOT have diabetes):  o Drink ONE (1) Pre-Surgery Clear Ensure as directed.   o This drink was given to you during your hospital  pre-op appointment visit. o The pre-op nurse will instruct you on the time to drink the  Pre-Surgery Ensure depending on your surgery time. o Finish the drink at the designated time by the pre-op nurse- 8:00 AM o Nothing else to drink after completing the  Pre-Surgery Clear Ensure.         If you have questions, please contact your surgeons office.     Take these medicines the morning of surgery with A SIP OF WATER  amLODipine (NORVASC)  metoprolol succinate (TOPROL-XL  As of today, STOP taking any Aspirin (unless otherwise instructed by your surgeon) Aleve, Naproxen, Ibuprofen, Motrin, Advil, Goody's, BC's, all herbal medications, fish oil, and all vitamins.                      Do not wear jewelry, make up, or nail polish            Do not wear lotions, powders, perfumes/colognes, or deodorant.            Do not shave 48 hours prior to  surgery.  Men may shave face and neck.            Do not bring valuables to the hospital.            [facility] is not responsible for any belongings or valuables.  Do NOT Smoke (Tobacco/Vaping) or drink Alcohol 24 hours prior to your procedure If you use a CPAP at night, you may bring all equipment for your overnight stay.   Contacts, glasses, dentures or bridgework may not be worn into surgery.      For patients admitted to the hospital, discharge time will be determined by your treatment team.   Patients discharged the day of surgery will not be allowed to drive home, and someone needs to stay with them for 24 hours.    Special instructions:   [facility]- Preparing For Surgery  Before surgery, you can play an important role. Because skin is not sterile, your skin needs to be as free of germs as possible. You can reduce the number of germs on your skin by washing with CHG (chlorahexidine gluconate) Soap before surgery.  CHG is an antiseptic cleaner which kills germs and bonds with the skin to continue killing germs even after washing.    Oral Hygiene is also important to reduce your risk  of infection.  Remember - BRUSH YOUR TEETH THE MORNING OF SURGERY WITH YOUR REGULAR TOOTHPASTE  Please do not use if you have an allergy to CHG or antibacterial soaps. If your skin becomes reddened/irritated stop using the CHG.  Do not shave (including legs and underarms) for at least 48 hours prior to first CHG shower. It is OK to shave your face.  Please follow these instructions carefully.   1. Shower the NIGHT BEFORE SURGERY and the MORNING OF SURGERY with CHG Soap.   2. If you chose to wash your hair, wash your hair first as usual with your normal shampoo.  3. After you shampoo, rinse your hair and body thoroughly to remove the shampoo.  4. Use CHG as you would any other liquid soap. You can apply CHG directly to the skin and wash gently with a scrungie or a clean washcloth.   5. Apply the  CHG Soap to your body ONLY FROM THE NECK DOWN.  Do not use on open wounds or open sores. Avoid contact with your eyes, ears, mouth and genitals (private parts). Wash Face and genitals (private parts)  with your normal soap.   6. Wash thoroughly, paying special attention to the area where your surgery will be performed.  7. Thoroughly rinse your body with warm water from the neck down.  8. DO NOT shower/wash with your normal soap after using and rinsing off the CHG Soap.  9. Pat yourself dry with a CLEAN TOWEL.  10. Wear CLEAN PAJAMAS to bed the night before surgery  11. Place CLEAN SHEETS on your bed the night of your first shower and DO NOT SLEEP WITH PETS.   Day of Surgery: Wear Clean/Comfortable clothing the morning of surgery Do not apply any deodorants/lotions.   Remember to brush your teeth WITH YOUR REGULAR TOOTHPASTE.   Please read over the following fact sheets that you were given.

## 2019-12-09 ENCOUNTER — Encounter (HOSPITAL_COMMUNITY): Payer: Self-pay

## 2019-12-09 ENCOUNTER — Encounter (HOSPITAL_COMMUNITY)
Admission: RE | Admit: 2019-12-09 | Discharge: 2019-12-09 | Disposition: A | Discharge status: Home / Self Care | Payer: Commercial Managed Care - PPO | Source: Ambulatory Visit | Attending: General Surgery | Admitting: General Surgery

## 2019-12-09 ENCOUNTER — Other Ambulatory Visit: Payer: Self-pay

## 2019-12-09 DIAGNOSIS — Z01812 Encounter for preprocedural laboratory examination: Secondary | ICD-10-CM | POA: Insufficient documentation

## 2019-12-09 LAB — CBC
HCT: 42.4 % (ref 36.0–46.0)
Hemoglobin: 13.2 g/dL (ref 12.0–15.0)
MCH: 29.3 pg (ref 26.0–34.0)
MCHC: 31.1 g/dL (ref 30.0–36.0)
MCV: 94 fL (ref 80.0–100.0)
Platelets: 353 10*3/uL (ref 150–400)
RBC: 4.51 MIL/uL (ref 3.87–5.11)
RDW: 14.7 % (ref 11.5–15.5)
WBC: 6.1 10*3/uL (ref 4.0–10.5)
nRBC: 0 % (ref 0.0–0.2)

## 2019-12-09 LAB — BASIC METABOLIC PANEL
Anion gap: 9 (ref 5–15)
BUN: 17 mg/dL (ref 6–20)
CO2: 29 mmol/L (ref 22–32)
Calcium: 9.5 mg/dL (ref 8.9–10.3)
Chloride: 103 mmol/L (ref 98–111)
Creatinine, Ser: 0.97 mg/dL (ref 0.44–1.00)
GFR calc Af Amer: >60 mL/min (ref >60)
GFR calc non Af Amer: >60 mL/min (ref >60)
Glucose, Bld: 108 mg/dL — ABNORMAL HIGH (ref 70–99)
Potassium: 3.3 mmol/L — ABNORMAL LOW (ref 3.5–5.1)
Sodium: 141 mmol/L (ref 135–145)

## 2019-12-09 NOTE — Progress Notes (Addendum)
PCP - Dr. Julian Hy Cardiologist - Denies   Chest x-ray - Not indicated  EKG - 12/09/19 Stress Test - Yes cannot recall date ECHO - 12/03/19 Cardiac Cath - Denies  Sleep Study - Denies DM - Denies   ERAS Protcol -Instructions Given PRE-SURGERY Ensure given  COVID TEST- 12/12/19   Anesthesia review: Yes EKG abnormal    Patient denies shortness of breath, fever, cough and chest pain at PAT appointment   All instructions explained to the patient, with a verbal understanding of the material. Patient agrees to go over the instructions while at home for a better understanding. Patient also instructed to self quarantine after being tested for COVID-19. The opportunity to ask questions was provided.

## 2019-12-10 ENCOUNTER — Telehealth: Payer: Self-pay | Admitting: *Deleted

## 2019-12-10 ENCOUNTER — Encounter (HOSPITAL_COMMUNITY)
Admission: RE | Admit: 2019-12-10 | Discharge: 2019-12-10 | Disposition: A | Discharge status: Home / Self Care | Payer: Commercial Managed Care - PPO | Source: Ambulatory Visit | Attending: Hematology | Admitting: Hematology

## 2019-12-10 DIAGNOSIS — Z170 Estrogen receptor positive status [ER+]: Secondary | ICD-10-CM | POA: Diagnosis present

## 2019-12-10 DIAGNOSIS — C50411 Malignant neoplasm of upper-outer quadrant of right female breast: Secondary | ICD-10-CM | POA: Insufficient documentation

## 2019-12-10 MED ORDER — TECHNETIUM TC 99M MEDRONATE IV KIT
21.1000 | PACK | Freq: Once | INTRAVENOUS | Status: AC | PRN
  Administered 2019-12-10: 21.1 via INTRAVENOUS

## 2019-12-10 NOTE — Telephone Encounter (Signed)
FMLA forms for husband Juel Ripley completed and signed by Dr. Burr Medico. Pt at [facility] for appt. FMLA forms given to pt.

## 2019-12-11 ENCOUNTER — Ambulatory Visit
Admission: RE | Admit: 2019-12-11 | Discharge: 2019-12-11 | Disposition: A | Discharge status: Home / Self Care | Payer: Commercial Managed Care - PPO | Source: Ambulatory Visit | Attending: Hematology | Admitting: Hematology

## 2019-12-11 ENCOUNTER — Encounter: Payer: Self-pay | Admitting: *Deleted

## 2019-12-11 ENCOUNTER — Other Ambulatory Visit: Payer: Self-pay

## 2019-12-11 ENCOUNTER — Telehealth: Payer: Self-pay | Admitting: *Deleted

## 2019-12-11 DIAGNOSIS — C50411 Malignant neoplasm of upper-outer quadrant of right female breast: Secondary | ICD-10-CM

## 2019-12-11 MED ORDER — IOPAMIDOL (ISOVUE-300) INJECTION 61%
100.0000 mL | Freq: Once | INTRAVENOUS | Status: AC | PRN
  Administered 2019-12-11: 100 mL via INTRAVENOUS

## 2019-12-11 NOTE — Progress Notes (Signed)
Pharmacist Chemotherapy Monitoring - Initial Assessment    Anticipated start date: 12/17/19  Regimen:  . Are orders appropriate based on the patient's diagnosis, regimen, and cycle? Yes . Does the plan date match the patient's scheduled date? Yes . Is the sequencing of drugs appropriate? Yes . Are the premedications appropriate for the patient's regimen? Yes . Prior Authorization for treatment is: Pending o If applicable, is the correct biosimilar selected based on the patient's insurance? yes  Organ Function and Labs: Marland Kitchen Are dose adjustments needed based on the patient's renal function, hepatic function, or hematologic function? No . Are appropriate labs ordered prior to the start of patient's treatment? Yes . Other organ system assessment, if indicated: trastuzumab: Echo/ MUGA . The following baseline labs, if indicated, have been ordered: N/A  Dose Assessment: . Are the drug doses appropriate? Yes . Are the following correct: o Drug concentrations Yes o IV fluid compatible with drug Yes o Administration routes Yes o Timing of therapy Yes . If applicable, does the patient have documented access for treatment and/or plans for port-a-cath placement? yes . If applicable, have lifetime cumulative doses been properly documented and assessed? yes Lifetime Dose Tracking  No doses have been documented on this patient for the following tracked chemicals: Doxorubicin, Epirubicin, Idarubicin, Daunorubicin, Mitoxantrone, Bleomycin, Oxaliplatin, Carboplatin, Liposomal Doxorubicin  o   Toxicity Monitoring/Prevention: . The patient has the following take home antiemetics prescribed: Ondansetron, Prochlorperazine and Dexamethasone . The patient has the following take home medications prescribed: N/A . Medication allergies and previous infusion related reactions, if applicable, have been reviewed and addressed. Yes . The patient's current medication list has been assessed for drug-drug interactions  with their chemotherapy regimen. no significant drug-drug interactions were identified on review.  Order Review: . Are the treatment plan orders signed? No . Is the patient scheduled to see a provider prior to their treatment? Yes  I verify that I have reviewed each item in the above checklist and answered each question accordingly.   Kennith Center, Pharm.D., CPP 12/11/2019@11 :18 PM

## 2019-12-11 NOTE — Telephone Encounter (Signed)
Left message on voicemail to follow up from [facility] last week and assess navigation needs.

## 2019-12-12 ENCOUNTER — Ambulatory Visit
Admission: RE | Admit: 2019-12-12 | Discharge: 2019-12-12 | Disposition: A | Discharge status: Home / Self Care | Payer: Commercial Managed Care - PPO | Source: Ambulatory Visit | Attending: Hematology | Admitting: Hematology

## 2019-12-12 ENCOUNTER — Telehealth: Payer: Self-pay | Admitting: *Deleted

## 2019-12-12 ENCOUNTER — Other Ambulatory Visit (HOSPITAL_COMMUNITY)
Admission: RE | Admit: 2019-12-12 | Discharge: 2019-12-12 | Disposition: A | Discharge status: Home / Self Care | Payer: Commercial Managed Care - PPO | Source: Ambulatory Visit | Attending: General Surgery | Admitting: General Surgery

## 2019-12-12 ENCOUNTER — Encounter: Payer: Self-pay | Admitting: *Deleted

## 2019-12-12 DIAGNOSIS — Z170 Estrogen receptor positive status [ER+]: Secondary | ICD-10-CM

## 2019-12-12 DIAGNOSIS — C50411 Malignant neoplasm of upper-outer quadrant of right female breast: Secondary | ICD-10-CM

## 2019-12-12 DIAGNOSIS — Z20822 Contact with and (suspected) exposure to covid-19: Secondary | ICD-10-CM | POA: Diagnosis not present

## 2019-12-12 DIAGNOSIS — Z01812 Encounter for preprocedural laboratory examination: Secondary | ICD-10-CM | POA: Insufficient documentation

## 2019-12-12 LAB — SARS CORONAVIRUS 2 (TAT 6-24 HRS): SARS Coronavirus 2: NEGATIVE

## 2019-12-12 MED ORDER — GADOBUTROL 1 MMOL/ML IV SOLN
8.0000 mL | Freq: Once | INTRAVENOUS | Status: AC | PRN
  Administered 2019-12-12: 8 mL via INTRAVENOUS

## 2019-12-12 NOTE — Telephone Encounter (Signed)
Spoke with patient to inform her of the change in her echo appointment.  Confirmed for 8/24 at 915 at [facility].

## 2019-12-12 NOTE — Progress Notes (Signed)
The following biosimilar Ziextenzo (pegfilgrastim-bmez) and Kanjinti (trastuzumab-anns) has been selected for use in this patient.  Kennith Center, Pharm.D., CPP 12/12/2019@8 :30 AM

## 2019-12-14 NOTE — Anesthesia Preprocedure Evaluation (Addendum)
Anesthesia Evaluation  Patient identified by MRN, date of birth, ID band Patient awake    Reviewed: Allergy & Precautions, NPO status , Patient's Chart, lab work & pertinent test results, reviewed documented beta blocker date and time   History of Anesthesia Complications Negative for: history of anesthetic complications  Airway Mallampati: II  TM Distance: >3 FB Neck ROM: Full    Dental  (+) Missing,    Pulmonary neg pulmonary ROS,    Pulmonary exam normal        Cardiovascular hypertension, Pt. on medications and Pt. on home beta blockers Normal cardiovascular exam     Neuro/Psych negative neurological ROS  negative psych ROS   GI/Hepatic negative GI ROS, Neg liver ROS,   Endo/Other  negative endocrine ROS  Renal/GU negative Renal ROS  negative genitourinary   Musculoskeletal negative musculoskeletal ROS (+)   Abdominal   Peds  Hematology negative hematology ROS (+)   Anesthesia Other Findings Right breast ca   Reproductive/Obstetrics negative OB ROS                            Anesthesia Physical Anesthesia Plan  ASA: II  Anesthesia Plan: General   Post-op Pain Management:    Induction: Intravenous  PONV Risk Score and Plan: 3 and Treatment may vary due to age or medical condition, Ondansetron, Dexamethasone and Midazolam  Airway Management Planned: LMA  Additional Equipment: None  Intra-op Plan:   Post-operative Plan: Extubation in OR  Informed Consent: I have reviewed the patients History and Physical, chart, labs and discussed the procedure including the risks, benefits and alternatives for the proposed anesthesia with the patient or authorized representative who has indicated his/her understanding and acceptance.     Dental advisory given  Plan Discussed with: CRNA  Anesthesia Plan Comments:        Anesthesia Quick Evaluation

## 2019-12-15 ENCOUNTER — Ambulatory Visit (HOSPITAL_COMMUNITY): Payer: Commercial Managed Care - PPO | Admitting: Anesthesiology

## 2019-12-15 ENCOUNTER — Ambulatory Visit (HOSPITAL_COMMUNITY): Payer: Commercial Managed Care - PPO

## 2019-12-15 ENCOUNTER — Encounter: Payer: Self-pay | Admitting: Hematology

## 2019-12-15 ENCOUNTER — Inpatient Hospital Stay (HOSPITAL_BASED_OUTPATIENT_CLINIC_OR_DEPARTMENT_OTHER): Payer: Commercial Managed Care - PPO | Admitting: Hematology

## 2019-12-15 ENCOUNTER — Other Ambulatory Visit: Payer: Self-pay

## 2019-12-15 ENCOUNTER — Encounter (HOSPITAL_COMMUNITY)
Admission: RE | Disposition: A | Discharge status: Home / Self Care | Payer: Self-pay | Source: Home / Self Care | Attending: General Surgery

## 2019-12-15 ENCOUNTER — Ambulatory Visit (HOSPITAL_COMMUNITY)
Admission: RE | Admit: 2019-12-15 | Discharge: 2019-12-15 | Disposition: A | Discharge status: Home / Self Care | Payer: Commercial Managed Care - PPO | Attending: General Surgery | Admitting: General Surgery

## 2019-12-15 ENCOUNTER — Other Ambulatory Visit (HOSPITAL_COMMUNITY): Payer: Commercial Managed Care - PPO

## 2019-12-15 ENCOUNTER — Encounter (HOSPITAL_COMMUNITY): Payer: Self-pay | Admitting: General Surgery

## 2019-12-15 ENCOUNTER — Ambulatory Visit (HOSPITAL_COMMUNITY): Payer: Commercial Managed Care - PPO | Admitting: Physician Assistant

## 2019-12-15 ENCOUNTER — Inpatient Hospital Stay: Payer: Commercial Managed Care - PPO

## 2019-12-15 ENCOUNTER — Encounter: Payer: Self-pay | Admitting: *Deleted

## 2019-12-15 DIAGNOSIS — C50411 Malignant neoplasm of upper-outer quadrant of right female breast: Secondary | ICD-10-CM

## 2019-12-15 DIAGNOSIS — Z170 Estrogen receptor positive status [ER+]: Secondary | ICD-10-CM

## 2019-12-15 DIAGNOSIS — Z452 Encounter for adjustment and management of vascular access device: Secondary | ICD-10-CM | POA: Diagnosis not present

## 2019-12-15 DIAGNOSIS — I10 Essential (primary) hypertension: Secondary | ICD-10-CM | POA: Diagnosis not present

## 2019-12-15 DIAGNOSIS — Z95828 Presence of other vascular implants and grafts: Secondary | ICD-10-CM

## 2019-12-15 DIAGNOSIS — Z419 Encounter for procedure for purposes other than remedying health state, unspecified: Secondary | ICD-10-CM

## 2019-12-15 HISTORY — PX: PORTACATH PLACEMENT: SHX2246

## 2019-12-15 SURGERY — INSERTION, TUNNELED CENTRAL VENOUS DEVICE, WITH PORT
Anesthesia: General | Site: Chest | Laterality: Left

## 2019-12-15 MED ORDER — BUPIVACAINE HCL (PF) 0.25 % IJ SOLN
INTRAMUSCULAR | Status: DC | PRN
  Administered 2019-12-15: 20 mL

## 2019-12-15 MED ORDER — FENTANYL CITRATE (PF) 250 MCG/5ML IJ SOLN
INTRAMUSCULAR | Status: AC
  Filled 2019-12-15: qty 5

## 2019-12-15 MED ORDER — OXYCODONE HCL 5 MG PO TABS: 5.0000 mg | ORAL_TABLET | Freq: Once | ORAL | Status: DC | PRN

## 2019-12-15 MED ORDER — ACETAMINOPHEN 500 MG PO TABS
1000.0000 mg | ORAL_TABLET | ORAL | Status: AC
  Administered 2019-12-15: 1000 mg via ORAL
  Filled 2019-12-15: qty 2

## 2019-12-15 MED ORDER — ONDANSETRON HCL 4 MG/2ML IJ SOLN
INTRAMUSCULAR | Status: DC | PRN
  Administered 2019-12-15: 4 mg via INTRAVENOUS

## 2019-12-15 MED ORDER — 0.9 % SODIUM CHLORIDE (POUR BTL) OPTIME
TOPICAL | Status: DC | PRN
  Administered 2019-12-15: 1000 mL

## 2019-12-15 MED ORDER — ORAL CARE MOUTH RINSE: 15.0000 mL | Freq: Once | OROMUCOSAL | Status: AC

## 2019-12-15 MED ORDER — HEPARIN SOD (PORK) LOCK FLUSH 100 UNIT/ML IV SOLN
INTRAVENOUS | Status: DC | PRN
  Administered 2019-12-15: 500 [IU] via INTRAVENOUS

## 2019-12-15 MED ORDER — LIDOCAINE 2% (20 MG/ML) 5 ML SYRINGE
INTRAMUSCULAR | Status: AC
  Filled 2019-12-15: qty 5

## 2019-12-15 MED ORDER — MIDAZOLAM HCL 2 MG/2ML IJ SOLN
INTRAMUSCULAR | Status: AC
  Filled 2019-12-15: qty 2

## 2019-12-15 MED ORDER — OXYCODONE HCL 5 MG PO TABS: 5.0000 mg | ORAL_TABLET | Freq: Four times a day (QID) | ORAL | 0 refills | Status: DC | PRN

## 2019-12-15 MED ORDER — ONDANSETRON HCL 4 MG/2ML IJ SOLN
INTRAMUSCULAR | Status: AC
  Filled 2019-12-15: qty 2

## 2019-12-15 MED ORDER — DEXAMETHASONE SODIUM PHOSPHATE 10 MG/ML IJ SOLN
INTRAMUSCULAR | Status: AC
  Filled 2019-12-15: qty 1

## 2019-12-15 MED ORDER — OXYCODONE HCL 5 MG/5ML PO SOLN: 5.0000 mg | Freq: Once | ORAL | Status: DC | PRN

## 2019-12-15 MED ORDER — CHLORHEXIDINE GLUCONATE 0.12 % MT SOLN
15.0000 mL | Freq: Once | OROMUCOSAL | Status: AC
  Administered 2019-12-15: 15 mL via OROMUCOSAL
  Filled 2019-12-15: qty 15

## 2019-12-15 MED ORDER — ENSURE PRE-SURGERY PO LIQD: 296.0000 mL | Freq: Once | ORAL | Status: DC

## 2019-12-15 MED ORDER — EPHEDRINE 5 MG/ML INJ
INTRAVENOUS | Status: AC
  Filled 2019-12-15: qty 10

## 2019-12-15 MED ORDER — CEFAZOLIN SODIUM-DEXTROSE 2-4 GM/100ML-% IV SOLN
2.0000 g | INTRAVENOUS | Status: AC
  Administered 2019-12-15: 2 g via INTRAVENOUS
  Filled 2019-12-15: qty 100

## 2019-12-15 MED ORDER — PROMETHAZINE HCL 25 MG/ML IJ SOLN: 6.2500 mg | INTRAMUSCULAR | Status: DC | PRN

## 2019-12-15 MED ORDER — CHLORHEXIDINE GLUCONATE CLOTH 2 % EX PADS: 6.0000 | MEDICATED_PAD | Freq: Once | CUTANEOUS | Status: DC

## 2019-12-15 MED ORDER — MIDAZOLAM HCL 2 MG/2ML IJ SOLN
INTRAMUSCULAR | Status: DC | PRN
  Administered 2019-12-15: 2 mg via INTRAVENOUS

## 2019-12-15 MED ORDER — FENTANYL CITRATE (PF) 250 MCG/5ML IJ SOLN
INTRAMUSCULAR | Status: DC | PRN
Start: 2019-12-15 — End: 2019-12-15
  Administered 2019-12-15 (×3): 50 ug via INTRAVENOUS

## 2019-12-15 MED ORDER — SUCCINYLCHOLINE CHLORIDE 200 MG/10ML IV SOSY
PREFILLED_SYRINGE | INTRAVENOUS | Status: AC
  Filled 2019-12-15: qty 10

## 2019-12-15 MED ORDER — LACTATED RINGERS IV SOLN: INTRAVENOUS | Status: DC

## 2019-12-15 MED ORDER — PROPOFOL 10 MG/ML IV BOLUS
INTRAVENOUS | Status: DC | PRN
  Administered 2019-12-15: 180 mg via INTRAVENOUS

## 2019-12-15 MED ORDER — SODIUM CHLORIDE 0.9 % IV SOLN
INTRAVENOUS | Status: AC
  Filled 2019-12-15: qty 1.2

## 2019-12-15 MED ORDER — PROPOFOL 10 MG/ML IV BOLUS
INTRAVENOUS | Status: AC
  Filled 2019-12-15: qty 20

## 2019-12-15 MED ORDER — SODIUM CHLORIDE 0.9 % IV SOLN
INTRAVENOUS | Status: DC | PRN
  Administered 2019-12-15: 500 mL

## 2019-12-15 MED ORDER — LIDOCAINE-EPINEPHRINE 1 %-1:100000 IJ SOLN
INTRAMUSCULAR | Status: DC | PRN
  Administered 2019-12-15: 20 mL

## 2019-12-15 MED ORDER — PHENYLEPHRINE 40 MCG/ML (10ML) SYRINGE FOR IV PUSH (FOR BLOOD PRESSURE SUPPORT)
PREFILLED_SYRINGE | INTRAVENOUS | Status: DC | PRN
  Administered 2019-12-15 (×2): 120 ug via INTRAVENOUS

## 2019-12-15 MED ORDER — HEPARIN SOD (PORK) LOCK FLUSH 100 UNIT/ML IV SOLN
INTRAVENOUS | Status: AC
  Filled 2019-12-15: qty 5

## 2019-12-15 MED ORDER — DEXAMETHASONE SODIUM PHOSPHATE 10 MG/ML IJ SOLN
INTRAMUSCULAR | Status: DC | PRN
  Administered 2019-12-15: 10 mg via INTRAVENOUS

## 2019-12-15 MED ORDER — BUPIVACAINE HCL (PF) 0.25 % IJ SOLN
INTRAMUSCULAR | Status: AC
  Filled 2019-12-15: qty 30

## 2019-12-15 MED ORDER — LIDOCAINE-EPINEPHRINE 1 %-1:100000 IJ SOLN
INTRAMUSCULAR | Status: AC
  Filled 2019-12-15: qty 1

## 2019-12-15 MED ORDER — PHENYLEPHRINE 40 MCG/ML (10ML) SYRINGE FOR IV PUSH (FOR BLOOD PRESSURE SUPPORT)
PREFILLED_SYRINGE | INTRAVENOUS | Status: AC
  Filled 2019-12-15: qty 20

## 2019-12-15 MED ORDER — LIDOCAINE 2% (20 MG/ML) 5 ML SYRINGE
INTRAMUSCULAR | Status: DC | PRN
  Administered 2019-12-15: 100 mg via INTRAVENOUS

## 2019-12-15 MED ORDER — EPHEDRINE SULFATE-NACL 50-0.9 MG/10ML-% IV SOSY
PREFILLED_SYRINGE | INTRAVENOUS | Status: DC | PRN
  Administered 2019-12-15 (×3): 10 mg via INTRAVENOUS

## 2019-12-15 MED ORDER — FENTANYL CITRATE (PF) 100 MCG/2ML IJ SOLN: 25.0000 ug | INTRAMUSCULAR | Status: DC | PRN

## 2019-12-15 SURGICAL SUPPLY — 43 items
ADH SKN CLS APL DERMABOND .7 (GAUZE/BANDAGES/DRESSINGS) ×1
APL PRP STRL LF DISP 70% ISPRP (MISCELLANEOUS) ×1
BAG DECANTER FOR FLEXI CONT (MISCELLANEOUS) ×2 IMPLANT
CANISTER SUCT 3000ML PPV (MISCELLANEOUS) ×1 IMPLANT
CHLORAPREP W/TINT 26 (MISCELLANEOUS) ×2 IMPLANT
COVER SURGICAL LIGHT HANDLE (MISCELLANEOUS) ×2 IMPLANT
COVER TRANSDUCER ULTRASND GEL (DISPOSABLE) ×1 IMPLANT
DECANTER SPIKE VIAL GLASS SM (MISCELLANEOUS) ×4 IMPLANT
DERMABOND ADVANCED (GAUZE/BANDAGES/DRESSINGS) ×1
DERMABOND ADVANCED .7 DNX12 (GAUZE/BANDAGES/DRESSINGS) IMPLANT
DRAPE C-ARM 42X120 X-RAY (DRAPES) ×2 IMPLANT
DRAPE CHEST BREAST 15X10 FENES (DRAPES) ×2 IMPLANT
DRAPE WARM FLUID 44X44 (DRAPES) ×1 IMPLANT
DRSG TEGADERM 4X4.75 (GAUZE/BANDAGES/DRESSINGS) ×2 IMPLANT
ELECT COATED BLADE 2.86 ST (ELECTRODE) ×2 IMPLANT
ELECT REM PT RETURN 9FT ADLT (ELECTROSURGICAL) ×2
ELECTRODE REM PT RTRN 9FT ADLT (ELECTROSURGICAL) ×1 IMPLANT
GAUZE SPONGE 4X4 12PLY STRL (GAUZE/BANDAGES/DRESSINGS) ×1 IMPLANT
GEL ULTRASOUND 20GR AQUASONIC (MISCELLANEOUS) ×1 IMPLANT
GLOVE BIO SURGEON STRL SZ 6 (GLOVE) ×2 IMPLANT
GLOVE INDICATOR 6.5 STRL GRN (GLOVE) ×2 IMPLANT
GOWN STRL REUS W/ TWL LRG LVL3 (GOWN DISPOSABLE) ×1 IMPLANT
GOWN STRL REUS W/TWL 2XL LVL3 (GOWN DISPOSABLE) ×2 IMPLANT
GOWN STRL REUS W/TWL LRG LVL3 (GOWN DISPOSABLE) ×2
IV CONNECTOR ONE LINK NDLESS (IV SETS) ×2 IMPLANT
KIT BASIN OR (CUSTOM PROCEDURE TRAY) ×2 IMPLANT
KIT PORT POWER 8FR ISP CVUE (Port) ×1 IMPLANT
KIT TURNOVER KIT B (KITS) ×2 IMPLANT
NEEDLE 22X1 1/2 (OR ONLY) (NEEDLE) ×2 IMPLANT
NS IRRIG 1000ML POUR BTL (IV SOLUTION) ×2 IMPLANT
PAD ARMBOARD 7.5X6 YLW CONV (MISCELLANEOUS) ×2 IMPLANT
PENCIL BUTTON HOLSTER BLD 10FT (ELECTRODE) ×2 IMPLANT
POSITIONER HEAD DONUT 9IN (MISCELLANEOUS) ×2 IMPLANT
SUT MON AB 4-0 PC3 18 (SUTURE) ×2 IMPLANT
SUT PROLENE 2 0 SH DA (SUTURE) ×4 IMPLANT
SUT VIC AB 3-0 SH 27 (SUTURE) ×2
SUT VIC AB 3-0 SH 27X BRD (SUTURE) ×1 IMPLANT
SYR 5ML LUER SLIP (SYRINGE) ×2 IMPLANT
TOWEL GREEN STERILE (TOWEL DISPOSABLE) ×2 IMPLANT
TOWEL GREEN STERILE FF (TOWEL DISPOSABLE) ×2 IMPLANT
TRAY LAPAROSCOPIC MC (CUSTOM PROCEDURE TRAY) ×2 IMPLANT
TUBE CONNECTING 12X1/4 (SUCTIONS) ×1 IMPLANT
YANKAUER SUCT BULB TIP NO VENT (SUCTIONS) IMPLANT

## 2019-12-15 NOTE — Anesthesia Postprocedure Evaluation (Signed)
Anesthesia Post Note  Patient: Carolyn Stewart  Procedure(s) Performed: INSERTION PORT-A-CATH WITH ULTRASOUND GUIDANCE (Left Chest)     Patient location during evaluation: PACU Anesthesia Type: General Level of consciousness: awake and alert and oriented Pain management: pain level controlled Vital Signs Assessment: post-procedure vital signs reviewed and stable Respiratory status: spontaneous breathing, nonlabored ventilation and respiratory function stable Cardiovascular status: blood pressure returned to baseline Postop Assessment: no apparent nausea or vomiting Anesthetic complications: no   No complications documented.  Last Vitals:  Vitals:   12/15/19 0900 12/15/19 0915  BP: 128/72 116/71  Pulse: 87 81  Resp: 16 18  Temp:  36.6 C  SpO2: 97% 96%    Last Pain:  Vitals:   12/15/19 0915  TempSrc:   PainSc: 0-No pain                 Brennan Bailey

## 2019-12-15 NOTE — Anesthesia Procedure Notes (Signed)
Procedure Name: LMA Insertion Date/Time: 12/15/2019 7:35 AM Performed by: Lance Coon, CRNA Pre-anesthesia Checklist: Patient identified, Emergency Drugs available, Suction available, Patient being monitored and Timeout performed Patient Re-evaluated:Patient Re-evaluated prior to induction Oxygen Delivery Method: Circle system utilized Preoxygenation: Pre-oxygenation with 100% oxygen Induction Type: IV induction LMA: LMA inserted LMA Size: 3.0 Number of attempts: 1 Placement Confirmation: positive ETCO2 and breath sounds checked- equal and bilateral Tube secured with: Tape Dental Injury: Teeth and Oropharynx as per pre-operative assessment

## 2019-12-15 NOTE — H&P (Signed)
Carolyn Stewart Appointment: 12/03/2019 1:00 PM Location: [facility] Patient #: [id number] DOB: Jun 02, 1960 Undefined / Language: Carolyn Stewart / Race: Black or African American Female   History of Present Illness Carolyn Klein MD; 12/03/2019 3:33 PM) The patient is a 59 year old female who presents with breast cancer. Pt is a lovely 59 yo F who is referred for consultation regarding right breast cancer dx 11/2019 by Dr. Melanee Stewart. She presented with left breast changes and right breast firmness. Diagnostic imaging was performed and showed an 11 cm mass on the right with multiple enlarged right lymph nodes. She had multiple small masses in the left breast and one slightly abnormal left node. Biopsies of the right breast and lymph node showed grade 2 invasive ductal carcinoma with DCIS. This was triple positive and Ki 67 was 30%. The left sided lymph node was biopsied and was benign. Given the many masses and the positive diagnosis on the right, the left breast biopsies were deferred for post MRI. The patient denies breast pain but has occasional heaviness on the right.   She has no personal or family cancer history. She is a home health aide. She is a G3P3 wtih first child at age 61. she had menarche at age 75 and menopause age 62. She used hormonal contraception for around 1 year. She hasn't used any hormonal replacement therapy.    dx mammogram and ultrasound 11/13/2019  ACR Breast Density Category c: The breast tissue is heterogeneously dense, which may obscure small masses.  FINDINGS: There is a large area of increased density containing suspicious calcifications with associated distortion centered in the retroareolar region, extending into the upper outer right breast. The abnormality appears to extend slightly medial to midline as well. Mammographically, the abnormality measures up to 7.5 cm. There are multiple masses primarily in the medial superior left breast. No other  suspicious mammographic findings are identified.  Mammographic images were processed with CAD.  On physical exam, the right breast is very firm.  Targeted ultrasound is performed, showing a large ill-defined mass containing calcifications in the right breast centered between 9 and 12 o'clock spanning up to 11 cm sonographically. The mass is somewhat ill-defined. There are multiple masses in the right axilla. One of the masses contains calcifications, denoted as mass number 2 measuring 9 x 11 by 10 mm. Several of these masses do not appear to represent definitive lymph nodes. Others may be small but abnormal appearing lymph nodes. Taken in total, a cluster of masses in the right axilla spans up to 3.7 cm.  There are fibrocystic changes on the left. There are also some solid-appearing masses. A solid appearing mass at 10:30, 6 cm from the nipple measures 9 x 6 x 6 mm. An adjacent solid mass at 10 o'clock, 6 cm from the nipple measures 8 x 6 by 5 mm. Another solid-appearing mass at 11 o'clock, 1 cm from the nipple measures 5 x 4 by 5 mm. Fibrocystic changes are seen at 10 o'clock, 12 o'clock, and 4 o'clock. Another solid-appearing mass seen at 5 o'clock, 4 cm from the nipple measuring 6 x 3 x 4 mm. There is a single abnormal node in the left axilla with a cortex measuring 5 mm and restriction of the fatty hilum.  IMPRESSION: Highly suspicious mass and calcifications in the right breast as above. Multiple masses in the right axilla as above. While some of these masses may represent small but abnormal lymph nodes, others do not have a typical lymph  node appearance. The most suspicious mass in the right axilla contains calcifications, denoted as mass 2. There are numerous masses in the left breast, several of which are solid as above. Fibrocystic changes are seen as well. There is an abnormal left axillary lymph node.  RECOMMENDATION: Recommend biopsying 2 sites within the large  dominant 11 cm ill-defined mass containing calcifications in the right breast utilizing ultrasound guidance. 2 biopsies are necessary to prove extent of disease. Recommend biopsying the mass in the right axilla containing calcifications described above as mass 2. Recommend biopsy of the abnormal left axillary lymph node. I suspect the biopsy will demonstrate malignancy on the right. Once malignancy on the right is confirmed, recommend breast MRI to further assess the left breast. If there is a particularly suspicious mass in the left breast on MRI, recommend MRI guided biopsy of that mass. If the masses are all similar in appearance without suspicious features, recommend ultrasound-guided biopsy of the largest left breast mass.  I have discussed the findings and recommendations with the patient. If applicable, a reminder letter will be sent to the patient regarding the next appointment.  BI-RADS CATEGORY 5: Highly suggestive of malignancy.   pathology 11/25/2019 1. Breast, right, needle core biopsy, upper outer - INVASIVE MAMMARY CARCINOMA - MAMMARY CARCINOMA IN SITU WITH CALCIFICATIONS AND NECROSIS 1. and 2. E-cadherin is POSITIVE supporting a ductal origin. 2. Lymph node, needle/core biopsy, right axilla - INVASIVE MAMMARY CARCINOMA - SEE COMMENT 3. Lymph node, needle/core biopsy, left axilla - BENIGN LYMPH NODE - NO CARCINOMA IDENTIFIED  The tumor cells are POSITIVE for Her2 (3+). Estrogen Receptor: 90%, POSITIVE, STRONG STAINING INTENSITY Progesterone Receptor: 20%, POSITIVE, STRONG STAINING INTENSITY Proliferation Marker Ki67: 30%   CBC and CMET are normal 12/03/2019 except for elevated alk phos at 141.     Past Surgical History Carolyn Klein, MD; 12/03/2019 3:33 PM) Carotid Artery Surgery  Right. Tonsillectomy   Diagnostic Studies History Carolyn Klein, MD; 12/03/2019 3:33 PM) Colonoscopy  never Mammogram  within last year Pap Smear  1-5 years  ago  Medication History Carolyn Slipper, RN; 12/03/2019 8:21 AM) Medications Reconciled  Social History Carolyn Klein, MD; 12/03/2019 3:33 PM) Alcohol use  Occasional alcohol use. Caffeine use  Carbonated beverages, Coffee, Tea. No drug use  Tobacco use  Never smoker.  Family History Carolyn Klein, MD; 12/03/2019 3:33 PM) Heart Disease  Father, Mother.  Pregnancy / Birth History Carolyn Klein, MD; 12/03/2019 3:33 PM) Age at menarche  60 years. Age of menopause  26-60 Contraceptive History  Oral contraceptives. Gravida  3 Maternal age  13-25 Para  3 Regular periods   Other Problems Carolyn Klein, MD; 12/03/2019 3:33 PM) High blood pressure     Review of Systems Carolyn Klein MD; 12/03/2019 3:33 PM) General Not Present- Appetite Loss, Chills, Fatigue, Fever, Night Sweats, Weight Gain and Weight Loss. Skin Not Present- Change in Wart/Mole, Dryness, Hives, Jaundice, New Lesions, Non-Healing Wounds, Rash and Ulcer. HEENT Present- Wears glasses/contact lenses. Not Present- Earache, Hearing Loss, Hoarseness, Nose Bleed, Oral Ulcers, Ringing in the Ears, Seasonal Allergies, Sinus Pain, Sore Throat, Visual Disturbances and Yellow Eyes. Respiratory Not Present- Bloody sputum, Chronic Cough, Difficulty Breathing, Snoring and Wheezing. Breast Present- Breast Pain. Not Present- Breast Mass, Nipple Discharge and Skin Changes. Cardiovascular Not Present- Chest Pain, Difficulty Breathing Lying Down, Leg Cramps, Palpitations, Rapid Heart Rate, Shortness of Breath and Swelling of Extremities. Gastrointestinal Not Present- Abdominal Pain, Bloating, Bloody Stool, Change in Bowel Habits, Chronic diarrhea, Constipation, Difficulty Swallowing, Excessive  gas, Gets full quickly at meals, Hemorrhoids, Indigestion, Nausea, Rectal Pain and Vomiting. Female Genitourinary Not Present- Frequency, Nocturia, Painful Urination, Pelvic Pain and Urgency. Musculoskeletal Not Present- Back Pain, Joint Pain,  Joint Stiffness, Muscle Pain, Muscle Weakness and Swelling of Extremities. Neurological Not Present- Decreased Memory, Fainting, Headaches, Numbness, Seizures, Tingling, Tremor, Trouble walking and Weakness. Psychiatric Not Present- Anxiety, Bipolar, Change in Sleep Pattern, Depression, Fearful and Frequent crying. Endocrine Present- Hot flashes. Not Present- Cold Intolerance, Excessive Hunger, Hair Changes, Heat Intolerance and New Diabetes. Hematology Not Present- Blood Thinners, Easy Bruising, Excessive bleeding, Gland problems, HIV and Persistent Infections.   Physical Exam Carolyn Klein MD; 12/03/2019 3:43 PM) General Mental Status-Alert. General Appearance-Consistent with stated age. Hydration-Well hydrated. Voice-Normal.  Head and Neck Head-normocephalic, atraumatic with no lesions or palpable masses. Trachea-midline. Thyroid Gland Characteristics - normal size and consistency.  Eye Eyeball - Bilateral-Extraocular movements intact. Sclera/Conjunctiva - Bilateral-No scleral icterus.  Chest and Lung Exam Chest and lung exam reveals -quiet, even and easy respiratory effort with no use of accessory muscles and on auscultation, normal breath sounds, no adventitious sounds and normal vocal resonance. Inspection Chest Wall - Normal. Back - normal.  Breast Note: right breast markedly abnormal. Mass visibly distorting upper outer right breast around 10-12 cm. nipple is retracted on that side. + palpable nodes on on the right. Lower right breast has some lymphedema, but no erythema. Left side without palpable masses. No palpable nodes. no nipple retraction on the left. both sides have relatively dense tissue.   Cardiovascular Cardiovascular examination reveals -normal heart sounds, regular rate and rhythm with no murmurs and normal pedal pulses bilaterally.  Abdomen Inspection Inspection of the abdomen reveals - No Hernias. Palpation/Percussion Palpation and  Percussion of the abdomen reveal - Soft, Non Tender, No Rebound tenderness, No Rigidity (guarding) and No hepatosplenomegaly. Auscultation Auscultation of the abdomen reveals - Bowel sounds normal.  Neurologic Neurologic evaluation reveals -alert and oriented x 3 with no impairment of recent or remote memory. Mental Status-Normal.  Musculoskeletal Global Assessment -Note: no gross deformities.  Normal Exam - Left-Upper Extremity Strength Normal and Lower Extremity Strength Normal. Normal Exam - Right-Upper Extremity Strength Normal and Lower Extremity Strength Normal.  Lymphatic Head & Neck  General Head & Neck Lymphatics: Bilateral - Description - Normal. Axillary  General Axillary Region: Bilateral - Description - Normal. Tenderness - Non Tender. Femoral & Inguinal  Generalized Femoral & Inguinal Lymphatics: Bilateral - Description - No Generalized lymphadenopathy.    Assessment & Plan Carolyn Klein MD; 12/03/2019 3:52 PM) BREAST CANCER METASTASIZED TO AXILLARY LYMPH NODE, RIGHT (C50.911) Impression: Pt has a new dx of cT3N2 right breast cancer. She will benefit from neoadjuvant chemotherapy. Dr. Burr Medico is ordering staging scans. She will then need right modified radical mastectomy. After that, radiation will be indicated as well as antiestrogen tx.  She will also need an MRI to fully evaluate the left breast. There were around 7-8 small masses wtih two more prominent. Once MRI is available, decision will be made about how many and which areas of the left breast require biopsy.  I will send her for non urgent plastic surgery evaluation. I discussed that likely delayed reconstruction would be recommended, but that she could have that discussion with them.  If she has metastatic disease, resection of the primary would be delayed further.  I will place port. I discussed surgery with the patient. I reviewed incision, risk, recovery, post op restrictions. I specifically  discussed pneumothorax, malposition, malfunction, infection of  port.  I will see her back prior to making final breast surgery plan, but we will place port expeditiously. Current Plans Pt Education - ccs port insertion education You are being scheduled for surgery- Our schedulers will call you.  You should hear from our office's scheduling department within 5 working days about the location, date, and time of surgery. We try to make accommodations for patient's preferences in scheduling surgery, but sometimes the OR schedule or the surgeon's schedule prevents [place] from making those accommodations.  If you have not heard from our office [phone]) in 5 working days, call the office and ask for your surgeon's nurse.  If you have other questions about your diagnosis, plan, or surgery, call the office and ask for your surgeon's nurse.

## 2019-12-15 NOTE — Discharge Instructions (Signed)
[facility] Office Phone Number [phone]   POST OP INSTRUCTIONS  Always review your discharge instruction sheet given to you by the facility where your surgery was performed.  IF YOU HAVE DISABILITY OR FAMILY LEAVE FORMS, YOU MUST BRING THEM TO THE OFFICE FOR PROCESSING.  DO NOT GIVE THEM TO YOUR DOCTOR.  1. A prescription for pain medication may be given to you upon discharge.  Take your pain medication as prescribed, if needed.  If narcotic pain medicine is not needed, then you may take acetaminophen (Tylenol) or ibuprofen (Advil) as needed. 2. Take your usually prescribed medications unless otherwise directed 3. If you need a refill on your pain medication, please contact your pharmacy.  They will contact our office to request authorization.  Prescriptions will not be filled after 5pm or on week-ends. 4. You should eat very light the first 24 hours after surgery, such as soup, crackers, pudding, etc.  Resume your normal diet the day after surgery 5. It is common to experience some constipation if taking pain medication after surgery.  Increasing fluid intake and taking a stool softener will usually help or prevent this problem from occurring.  A mild laxative (Milk of Magnesia or Miralax) should be taken according to package directions if there are no bowel movements after 48 hours. 6. You may shower in 48 hours.  The surgical glue will flake off in 2-3 weeks.   7. ACTIVITIES:  No strenuous activity or heavy lifting for 1 week.   a. You may drive when you no longer are taking prescription pain medication, you can comfortably wear a seatbelt, and you can safely maneuver your car and apply brakes. b. RETURN TO WORK:  __________as tolerated if no lifting for 1 week.  _______________ You should see your doctor in the office for a follow-up appointment approximately three-four weeks after your surgery.    WHEN TO CALL YOUR DOCTOR: 1. Fever over 101.0 2. Nausea and/or  vomiting. 3. Extreme swelling or bruising. 4. Continued bleeding from incision. 5. Increased pain, redness, or drainage from the incision.  The clinic staff is available to answer your questions during regular business hours.  Please don't hesitate to call and ask to speak to one of the nurses for clinical concerns.  If you have a medical emergency, go to the nearest emergency room or call 911.  A surgeon from [facility] is always on call at the hospital.  For further questions, please visit [email or web address]   

## 2019-12-15 NOTE — Interval H&P Note (Signed)
History and Physical Interval Note:  12/15/2019 7:12 AM  Carolyn Stewart  has presented today for surgery, with the diagnosis of RIGHT BREAST CANCER.  The various methods of treatment have been discussed with the patient and family. After consideration of risks, benefits and other options for treatment, the patient has consented to  Procedure(s): INSERTION PORT-A-CATH WITH ULTRASOUND GUIDANCE (N/A) as a surgical intervention.  The patient's history has been reviewed, patient examined, no change in status, stable for surgery.  I have reviewed the patient's chart and labs.  Questions were answered to the patient's satisfaction.     [name]

## 2019-12-15 NOTE — Transfer of Care (Signed)
Immediate Anesthesia Transfer of Care Note  Patient: Carolyn Stewart  Procedure(s) Performed: INSERTION PORT-A-CATH WITH ULTRASOUND GUIDANCE (Left Chest)  Patient Location: PACU  Anesthesia Type:General  Level of Consciousness: drowsy and patient cooperative  Airway & Oxygen Therapy: Patient Spontanous Breathing  Post-op Assessment: Report given to RN and Post -op Vital signs reviewed and stable  Post vital signs: Reviewed and stable  Last Vitals:  Vitals Value Taken Time  BP 138/77 12/15/19 0845  Temp    Pulse 94 12/15/19 0845  Resp 24 12/15/19 0845  SpO2 99 % 12/15/19 0845  Vitals shown include unvalidated device data.  Last Pain:  Vitals:   12/15/19 0637  TempSrc: Oral  PainSc:          Complications: No complications documented.

## 2019-12-15 NOTE — Op Note (Signed)
PREOPERATIVE DIAGNOSIS:  Right breast cancer, upper outer quadrant, cT3N1,+/+/+     POSTOPERATIVE DIAGNOSIS:  Same     PROCEDURE: Left subclavian port placement, Bard ClearVue  Power Port, MRI safe, 8-French.      SURGEON:  [name], MD      ANESTHESIA:  General   FINDINGS:  Good venous return, easy flush, and tip of the catheter and   SVC 25 cm.      SPECIMEN:  None.      ESTIMATED BLOOD LOSS:  Minimal.      COMPLICATIONS:  None known.      PROCEDURE:  Pt was identified in the holding area and taken to   the operating room, where patient was placed supine on the operating room   table.  General anesthesia was induced.  Patient's arms were tucked and the upper   chest and neck were prepped and draped in sterile fashion.  Time-out was   performed according to the surgical safety check list.  When all was   correct, we continued.   Local anesthetic was administered over this   area at the angle of the clavicle.  The vein was accessed with 1 pass(es) of the needle. There was good venous return and the wire passed easily with no ectopy.   Fluoroscopy was used to confirm that the wire was in the vena cava.      The patient was placed back level and the area for the pocket was anethetized   with local anesthetic.  A 3-cm oblique incision was made with a #15   blade incorporating the wire exit site.  Cautery was used to divide the subcutaneous tissues down to the  pectoralis muscle.  An [employer] retractor was used to elevate the skin while a pocket was created on top of the pectoralis fascia.  The port was placed into the pocket to confirm that it was of adequate size.  The  catheter was preattached to the port.  The port was then secured to the  pectoralis fascia with four 2-0 Prolene sutures.  These were clamped and  not tied down yet.    The catheter was placed along the wire to determine what length it should be to be in the SVC.  The catheter was cut at 25 cm.  The tunneler sheath  and dilator were passed over the wire and the dilator and wire were removed.  The catheter was advanced through the tunneler sheath and the tunneler sheath was pulled away.  Care was taken to keep the catheter in the tunneler sheath as this occurred. This was advanced and the tunneler sheath was removed.  There was good venous   return and easy flush of the catheter.  The Prolene sutures were tied   down to the pectoral fascia.  The skin was reapproximated using 3-0   Vicryl interrupted deep dermal sutures.    Fluoroscopy was used to re-confirm good position of the catheter.  The skin   was then closed using 4-0 Monocryl in a subcuticular fashion.  The port was flushed with concentrated heparin flush as well.  The wounds were then cleaned, dried, and dressed with Dermabond.  The port was left accessed.  The patient was awakened from anesthesia and taken to the PACU in stable condition.  Needle, sponge, and instrument counts were correct.               [name], MD

## 2019-12-15 NOTE — Progress Notes (Signed)
I called pt to introduce myself as her Arboriculturist, discuss copay assistance and the [employer].  I left a msg requesting she return my call if she was interested in applying for the grants.

## 2019-12-15 NOTE — Progress Notes (Signed)
[facility]   Telephone:([phone]) [phone] Fax:([phone]) [phone]   Clinic Follow up Note   Patient Care Team: Julian Hy, PA-C as PCP - General (Physician Assistant) [name], MD as Consulting Physician (General Surgery) Truitt Merle, MD as Consulting Physician (Hematology) Kyung Rudd, MD as Consulting Physician (Radiation Oncology) Mauro Kaufmann, RN as Oncology Nurse Navigator Rockwell Germany, RN as Oncology Nurse Navigator   I connected with Carolyn Stewart on 12/15/2019 at  2:40 PM EDT by video enabled telemedicine visit and verified that I am speaking with the correct person using two identifiers.  I discussed the limitations, risks, security and privacy concerns of performing an evaluation and management service by telephone and the availability of in person appointments. I also discussed with the patient that there may be a patient responsible charge related to this service. The patient expressed understanding and agreed to proceed.   Other persons participating in the visit and their role in the encounter: None  Patient's location:  Her home  Provider's location: My Office   CHIEF COMPLAINT: F/u of right breast cancer  SUMMARY OF ONCOLOGIC HISTORY: Oncology History Overview Note  Cancer Staging Malignant neoplasm of upper-outer quadrant of right breast in female, estrogen receptor positive ([place]) Staging form: Breast, AJCC 8th Edition - Clinical stage from 11/25/2019: Stage IIA (cT3, cN1, cM0, G2, ER+, PR+, HER2+) - Signed by Truitt Merle, MD on 12/03/2019    Malignant neoplasm of upper-outer quadrant of right breast in female, estrogen receptor positive ([place])  11/13/2019 Mammogram    Diagnostic Mammogram 11/13/19  IMPRESSION: -Highly suspicious mass and calcifications in the right breast centered between 9 and 12 o'clock spanning up to 11 cm.  -Multiple masses in the right axilla. One of the masses contains calcifications, denoted as mass number 2 measuring 9  x 11 by 10 mm. Several of these masses do not appear to represent definitive lymph nodes. Others may be small but abnormal appearing lymph nodes. Taken in total, a cluster of masses in the right axilla spans up to 3.7 cm.  -There are fibrocystic changes on the left. There are also some solid-appearing masses. A solid appearing mass at 10:30, 6 cm from the nipple measures 9 x 6 x 6 mm. An adjacent solid mass at 10 o'clock, 6 cm from the nipple measures 8 x 6 by 5 mm. Another solid-appearing mass at 11 o'clock, 1 cm from the nipple measures 5 x 4 by 5 mm. Fibrocystic changes are seen at 10 o'clock, 12 o'clock, and 4 o'clock. Another solid-appearing mass seen at 5 o'clock, 4 cm from the nipple measuring 6 x 3 x 4 mm. There is a single abnormal node in the left axilla with a cortex measuring 5 mm and restriction of the fatty hilum.   11/25/2019 Initial Biopsy   Diagnosis 1. Breast, right, needle core biopsy, upper outer - INVASIVE MAMMARY CARCINOMA - MAMMARY CARCINOMA IN SITU WITH CALCIFICATIONS AND NECROSIS - SEE COMMENT 2. Lymph node, needle/core biopsy, right axilla - INVASIVE MAMMARY CARCINOMA - SEE COMMENT 3. Lymph node, needle/core biopsy, left axilla - BENIGN LYMPH NODE - NO CARCINOMA IDENTIFIED Microscopic Comment 1. The biopsy material shows an infiltrative proliferation of cells arranged linearly and in small clusters. Based on the biopsy, the carcinoma appears Nottingham grade 2 of 3 and measures 1.2 cm in greatest linear extent. E-cadherin and prognostic markers (ER/PR/ki-67/HER2)are pending and will be reported in an addendum. Dr. Tresa Moore reviewed the case and agrees with the above diagnosis. These results  were called to [facility] on November 26, 2019. 2. Definitive nodal tissue is not identified. This biopsy has a slightly different architecture morphology than what is seen in part 1. The carcinoma measures 0.7 cm in greatest dimension. E-cadherin and  prognostic markers (ER, PR, Ki-67 and HER-2) are pending and will be reported in an addendum.   11/25/2019 Receptors her2   1. PROGNOSTIC INDICATORS Results: IMMUNOHISTOCHEMICAL AND MORPHOMETRIC ANALYSIS PERFORMED MANUALLY The tumor cells are POSITIVE for Her2 (3+). Estrogen Receptor: 90%, POSITIVE, STRONG STAINING INTENSITY Progesterone Receptor: 20%, POSITIVE, STRONG STAINING INTENSITY Proliferation Marker Ki67: 30%    2. PROGNOSTIC INDICATORS Results: IMMUNOHISTOCHEMICAL AND MORPHOMETRIC ANALYSIS PERFORMED MANUALLY The tumor cells are POSITIVE for Her2 (3+). Estrogen Receptor: 90%, POSITIVE, STRONG STAINING INTENSITY Progesterone Receptor: 25%, POSITIVE, STRONG STAINING INTENSITY Proliferation Marker Ki67: 30%      11/25/2019 Cancer Staging   Staging form: Breast, AJCC 8th Edition - Clinical stage from 11/25/2019: Stage IIA (cT3, cN1, cM0, G2, ER+, PR+, HER2+) - Signed by Truitt Merle, MD on 12/03/2019   12/01/2019 Initial Diagnosis   Malignant neoplasm of upper-outer quadrant of right breast in female, estrogen receptor positive ([place])    Neo-Adjuvant Chemotherapy   PENDING neo-adjuvant TCHP (chemo Docetaxel and Carboplatin with Antibody Herceptin and Perjeta) q3weeks for 6 cycles followed by maintenance Herceptin and Perjeta to complete 1 year treatment.    12/10/2019 Imaging   Bone Scan  IMPRESSION: Findings concerning for bony metastatic disease in the left acetabulum region, L3 vertebral body region, and anterolateral right tenth rib.   Increased uptake in several large joints likely is of arthropathic etiology.   12/11/2019 Imaging   CT CAP w contrast  IMPRESSION: Prominent glandular tissue in the central right breast with nipple retraction, likely corresponding to the patient's known right breast neoplasm.   Prominent right axillary nodes measuring up to 8 mm short axis, suspicious for nodal metastases in this clinical context.   3 mm subpleural pulmonary nodule in  the right lower lobe, likely benign. However, follow-up CT chest is suggested in 3-6 months.   Multiple hepatic lesions, including a dominant 2.4 cm lesion in segment 6, which is considered indeterminate. Metastasis is not excluded. Consider MRI abdomen with/without contrast for further characterization.   11 mm sclerotic lesion along the left posterior aspect of the T7 vertebral body raises concern for isolated osseous metastasis. Correlate with priors if available. Benign vertebral hemangioma at T11.   12/12/2019 Breast MRI   IMPRESSION: 1. The biopsy proven malignancy in the right breast involves all 4 quadrants and spans up to 9 cm by MRI. There is enhancement within the skin at the level of the nipple.   2. Multiple matted masses are seen in the right axilla, either abnormal metastatic lymph nodes or a combination of masses and metastatic lymph nodes. One of these has been previously biopsied showing invasive mammary carcinoma without definitive nodal tissue.   3.  There is a 1.5 cm Rotter's lymph node on the right.   4. There is markedly abnormal enhancement and edema in the right pectoralis major muscle spanning 6-7 cm, suspicious for metastatic disease.   5. There is diffuse nodular enhancement throughout the left breast, which may correspond with the solid-appearing masses seen on ultrasound. These all have a similar appearance on MRI.   6.  No findings of left axillary lymphadenopathy.   12/15/2019 Procedure   PAC placed    12/17/2019 -  Chemotherapy   neo-adjuvant TCHP (chemo Docetaxel  and Carboplatin with Antibody Herceptin and Perjeta) q3weeks starting 12/17/19 for 6 cycles followed by maintenance Herceptin and Perjeta to complete 1 year treatment.      CURRENT THERAPY:  neo-adjuvant TCHP q3weeks starting 12/17/19 for 6 cycles followed by maintenance Herceptin and Perjeta to complete 1 year treatment.  INTERVAL HISTORY:  Patrick Sohm is here for a follow up.  They identified themselves by face to face video. She notes she is doing well. She notes her PAC placement went well today.    REVIEW OF SYSTEMS:   Constitutional: Denies fevers, chills or abnormal weight loss Eyes: Denies blurriness of vision Ears, nose, mouth, throat, and face: Denies mucositis or sore throat Respiratory: Denies cough, dyspnea or wheezes Cardiovascular: Denies palpitation, chest discomfort or lower extremity swelling Gastrointestinal:  Denies nausea, heartburn or change in bowel habits Skin: Denies abnormal skin rashes Lymphatics: Denies new lymphadenopathy or easy bruising Neurological:Denies numbness, tingling or new weaknesses Behavioral/Psych: Mood is stable, no new changes  All other systems were reviewed with the patient and are negative.  MEDICAL HISTORY:  Past Medical History:  Diagnosis Date  . Cancer ([place]) 2021  . Hypertension     SURGICAL HISTORY: Past Surgical History:  Procedure Laterality Date  . left parotid tumor removal   2006  . TONSILLECTOMY      I have reviewed the social history and family history with the patient and they are unchanged from previous note.  ALLERGIES:  has No Known Allergies.  MEDICATIONS:  Current Outpatient Medications  Medication Sig Dispense Refill  . amLODipine (NORVASC) 10 MG tablet Take 10 mg by mouth daily.    . Cholecalciferol (VITAMIN D3) 50 MCG (2000 UT) TABS Take 2,000 Units by mouth daily.     Marland Kitchen dexamethasone (DECADRON) 4 MG tablet Take 2 tablets (8 mg total) by mouth 2 (two) times daily. Start the day before Taxotere. Then take daily x 3 days after chemotherapy. 30 tablet 1  . lidocaine-prilocaine (EMLA) cream Apply to affected area once 30 g 3  . metoprolol succinate (TOPROL-XL) 50 MG 24 hr tablet Take 50 mg by mouth daily.    . ondansetron (ZOFRAN) 8 MG tablet Take 1 tablet (8 mg total) by mouth 2 (two) times daily as needed (Nausea or vomiting). Start on the third day after chemotherapy. 30 tablet 1    . oxyCODONE (OXY IR/ROXICODONE) 5 MG immediate release tablet Take 1 tablet (5 mg total) by mouth every 6 (six) hours as needed for severe pain. 10 tablet 0  . prochlorperazine (COMPAZINE) 10 MG tablet Take 1 tablet (10 mg total) by mouth every 6 (six) hours as needed (Nausea or vomiting). 30 tablet 1   No current facility-administered medications for this visit.    PHYSICAL EXAMINATION: ECOG PERFORMANCE STATUS: 0 - Asymptomatic  No vitals taken today, Exam not performed today   LABORATORY DATA:  I have reviewed the data as listed CBC Latest Ref Rng & Units 12/09/2019 12/03/2019 02/08/2013  WBC 4.0 - 10.5 K/uL 6.1 5.9 -  Hemoglobin 12.0 - 15.0 g/dL 13.2 12.7 14.3  Hematocrit 36 - 46 % 42.4 40.0 42.0  Platelets 150 - 400 K/uL 353 330 -     CMP Latest Ref Rng & Units 12/09/2019 12/03/2019 02/08/2013  Glucose 70 - 99 mg/dL 108(H) 92 96  BUN 6 - 20 mg/dL _0 Creatinine 0.44 - 1.00 mg/dL 0.97 0.88 1.10  Sodium 135 - 145 mmol/L 141 142 143  Potassium 3.5 - 5.1 mmol/L 3.3(L)  3.9 3.4(L)  Chloride 98 - 111 mmol/L 103 108 103  CO2 22 - 32 mmol/L 29 26 -  Calcium 8.9 - 10.3 mg/dL 9.5 9.6 -  Total Protein 6.5 - 8.1 g/dL - 7.6 -  Total Bilirubin 0.3 - 1.2 mg/dL - 0.3 -  Alkaline Phos 38 - 126 U/L - 141(H) -  AST 15 - 41 U/L - 22 -  ALT 0 - 44 U/L - 23 -      RADIOGRAPHIC STUDIES: I have personally reviewed the radiological images as listed and agreed with the findings in the report. DG CHEST PORT 1 VIEW  Result Date: 12/15/2019 CLINICAL DATA:  Breast carcinoma.  Port-A-Cath placement. EXAM: PORTABLE CHEST 1 VIEW COMPARISON:  02/08/2013 FINDINGS: Left-sided power port is seen with tip overlying the superior cavoatrial junction. No evidence of pneumothorax or pleural effusion. Both lungs are clear. Heart size is normal. Oral contrast material is noted in the splenic flexure of colon. IMPRESSION: Left-sided power port in appropriate position. No evidence of pneumothorax. No active  disease. Electronically Signed   By: Marlaine Hind M.D.   On: 12/15/2019 09:20   DG Fluoro Guide CV Line-No Report  Result Date: 12/15/2019 Fluoroscopy was utilized by the requesting physician.  No radiographic interpretation.     ASSESSMENT & PLAN:  Carolyn Stewart is a 59 y.o. female with    1. Malignant neoplasm of upper-outer quadrant of right breast, ductal carcinoma, Stage IIA, c(T3N1Mx), possible liver and bone mets,  ER+/PR+/HER2+, Grade II-III  -She was recently diagnosed in 11/2019 with a large right breast mass in 4 quadrants measuring up to 11cm and right lymphadenopathy as well as indeterminate masses in left breast and one enlarged left axillar LN. Her right breast and LN biopsy was positive for invasive ductal carcinoma with components of DCIS, calcifications and necrosis in the breast. Her left axillary node biopsy was benign.  -We discussed and I personally reviewed her scans in great detail with patient. Her 12/11/19 CT CAP showed 3 mm subpleural pulmonary nodule in the right lower lobe, likely benign and will be monitored. Scan also showed Multiple hepatic lesions, including a dominant 2.4 cm lesion in segment 6, which is concerning for metastatic disease. I recommend MRI liver to further evaluate.  -Her 12/12/19 bone scan shows uptake in her spine at left L3, right 10th rib and left pelvic bone which is concerning for bone metastasis. -I discussed after imaging we may proceed with biopsy of pelvic bone or liver for definitive diagnosis. If found to have metastatic disease her cancer would be stage IV, no longer curable but still treatable. Her treatment plans may likely differ from planned [facility], and I will recommend first line chemo with docetaxel, trastuzumab and Perjeta -Her 12/12/19 breast MRI shows known right breast mass and nodular enhancement throughout the left breast. A left breast biopsy would be recommenced only if she does not have metastatic breast cancer. Will hold for  now.  -She will proceed with Echo tomorrow  -f/u after MRI and biopsy    2. HTN -Well controlled on Metoprolol and Amlodapine. -Continue to f/u with PCP   PLAN:  -Liver MRI w wo contrast ASAP -will discuss with IR about liver vs bone Biopsy  -ECHO tomorrow  -Lab and f/u after MRI and biopsy    No problem-specific Assessment & Plan notes found for this encounter.   No orders of the defined types were placed in this encounter.  I discussed the assessment and treatment plan  with the patient. The patient was provided an opportunity to ask questions and all were answered. The patient agreed with the plan and demonstrated an understanding of the instructions.  The patient was advised to call back or seek an in-person evaluation if the symptoms worsen or if the condition fails to improve as anticipated.  The total time spent in the appointment was 30 minutes.    Truitt Merle, MD 12/15/2019   I, Joslyn Devon, am acting as scribe for Truitt Merle, MD.   I have reviewed the above documentation for accuracy and completeness, and I agree with the above.

## 2019-12-16 ENCOUNTER — Other Ambulatory Visit: Payer: Self-pay

## 2019-12-16 ENCOUNTER — Ambulatory Visit (HOSPITAL_COMMUNITY)
Admission: RE | Admit: 2019-12-16 | Discharge: 2019-12-16 | Disposition: A | Discharge status: Home / Self Care | Payer: Commercial Managed Care - PPO | Source: Ambulatory Visit | Attending: Hematology | Admitting: Hematology

## 2019-12-16 ENCOUNTER — Encounter (HOSPITAL_COMMUNITY): Payer: Self-pay | Admitting: General Surgery

## 2019-12-16 ENCOUNTER — Encounter: Payer: Self-pay | Admitting: Hematology

## 2019-12-16 DIAGNOSIS — Z170 Estrogen receptor positive status [ER+]: Secondary | ICD-10-CM | POA: Insufficient documentation

## 2019-12-16 DIAGNOSIS — I10 Essential (primary) hypertension: Secondary | ICD-10-CM | POA: Insufficient documentation

## 2019-12-16 DIAGNOSIS — Z95828 Presence of other vascular implants and grafts: Secondary | ICD-10-CM

## 2019-12-16 DIAGNOSIS — C50411 Malignant neoplasm of upper-outer quadrant of right female breast: Secondary | ICD-10-CM | POA: Diagnosis not present

## 2019-12-16 LAB — ECHOCARDIOGRAM COMPLETE
Area-P 1/2: 4.6 cm2
S' Lateral: 3.1 cm

## 2019-12-16 MED ORDER — SODIUM CHLORIDE 0.9% FLUSH
10.0000 mL | Freq: Once | INTRAVENOUS | Status: DC
  Filled 2019-12-16: qty 10

## 2019-12-16 MED ORDER — HEPARIN SOD (PORK) LOCK FLUSH 100 UNIT/ML IV SOLN
500.0000 [IU] | Freq: Once | INTRAVENOUS | Status: DC
  Filled 2019-12-16: qty 5

## 2019-12-16 NOTE — Progress Notes (Signed)
I deaccessed Ms Stormes's port a cath, flushed with NSS 64ml with positive blood return, then flushed with heparin 500units.

## 2019-12-16 NOTE — Progress Notes (Signed)
°  Echocardiogram 2D Echocardiogram has been performed.  Darlina Sicilian M 12/16/2019, 10:02 AM

## 2019-12-16 NOTE — Progress Notes (Signed)
error 

## 2019-12-16 NOTE — Progress Notes (Signed)
Pt has been enrolled into the [employer] savings card program w/ [employer].  Pt will pay $0 per dose or cycle for all first AND subsequent fills, up to $10,000 per calendar year from 12/16/19 to 04/23/20.  Pt has been notified of approval.

## 2019-12-16 NOTE — Progress Notes (Signed)
Pt returned my call and gave me consent to apply for copay assistance in her behalf so I enrolled her in the[employer] program. She wasapproved for $25,000 for Perjeta for12 monthsfrom8/23/21. Pt's responsibility will be as little as $5 per infusion.  I enrolledherin the [employer] First Step program for[place] for $20,000 per calendar yearfrom8/23/21. Pt will pay $0 for herfirstdose or cycleand $16for each subsequent dose or cycle.  I also completed the online application w/ [employer] for [employer].  The application is pending.  I will notify her of the outcome once received.  Pt is overqualified for the [employer].

## 2019-12-17 ENCOUNTER — Inpatient Hospital Stay: Payer: Commercial Managed Care - PPO

## 2019-12-17 ENCOUNTER — Inpatient Hospital Stay: Payer: Commercial Managed Care - PPO | Admitting: Nurse Practitioner

## 2019-12-17 ENCOUNTER — Telehealth: Payer: Self-pay | Admitting: Hematology

## 2019-12-17 ENCOUNTER — Other Ambulatory Visit: Payer: Self-pay | Admitting: Hematology

## 2019-12-17 ENCOUNTER — Encounter: Payer: Self-pay | Admitting: *Deleted

## 2019-12-17 DIAGNOSIS — C50411 Malignant neoplasm of upper-outer quadrant of right female breast: Secondary | ICD-10-CM

## 2019-12-17 DIAGNOSIS — Z170 Estrogen receptor positive status [ER+]: Secondary | ICD-10-CM

## 2019-12-17 NOTE — Telephone Encounter (Signed)
No 8/23 los

## 2019-12-19 ENCOUNTER — Inpatient Hospital Stay: Payer: Commercial Managed Care - PPO

## 2019-12-24 ENCOUNTER — Ambulatory Visit (HOSPITAL_COMMUNITY)
Admission: RE | Admit: 2019-12-24 | Discharge: 2019-12-24 | Disposition: A | Discharge status: Home / Self Care | Payer: Commercial Managed Care - PPO | Source: Ambulatory Visit | Attending: Hematology | Admitting: Hematology

## 2019-12-24 ENCOUNTER — Other Ambulatory Visit: Payer: Self-pay

## 2019-12-24 DIAGNOSIS — Z170 Estrogen receptor positive status [ER+]: Secondary | ICD-10-CM | POA: Insufficient documentation

## 2019-12-24 DIAGNOSIS — C50411 Malignant neoplasm of upper-outer quadrant of right female breast: Secondary | ICD-10-CM

## 2019-12-24 MED ORDER — GADOBUTROL 1 MMOL/ML IV SOLN
8.0000 mL | Freq: Once | INTRAVENOUS | Status: AC | PRN
  Administered 2019-12-24: 8 mL via INTRAVENOUS

## 2019-12-25 ENCOUNTER — Encounter (HOSPITAL_COMMUNITY): Payer: Self-pay | Admitting: Radiology

## 2019-12-25 ENCOUNTER — Other Ambulatory Visit: Payer: Self-pay | Admitting: Hematology

## 2019-12-25 DIAGNOSIS — C50411 Malignant neoplasm of upper-outer quadrant of right female breast: Secondary | ICD-10-CM

## 2019-12-25 NOTE — Progress Notes (Signed)
Carolyn Stewart Female, 59 y.o., 02/22/1961 MRN:  [id number] Phone:  [phone] Carolyn Stewart) PCP:  Julian Hy, PA-C Coverage:  [facility] [facility] Ppo Next Appt With Radiology ([facility]-US 2) 01/01/2020 at 1:00 PM  RE: US Liver Biopsy Received: Today Sandi Mariscal, MD  Garth Bigness D OK for US guided liver lesion Bx.   Abd MRI - 9/1 - image 44, series 32.   Cathren Harsh       Previous Messages   ----- Message -----  From: Garth Bigness D  Sent: 12/25/2019  5:18 PM EDT  To: Ir Procedure Requests  Subject: US Liver Biopsy                  Procedure: US Liver Biopsy   Reason: Malignant neoplasm of upper-outer quadrant of right breast in female, estrogen receptor positive, tissue diagnosis for metastasis   History: NM, CT, MRI in computer   Provider: Truitt Merle   Provider Contact: [phone]

## 2019-12-25 NOTE — Progress Notes (Signed)
Carolyn Stewart Female, 59 y.o., [birth year] MRN:  [id number] Phone:  [phone] [name]) PCP:  Julian Hy, PA-C Coverage:  [place] [facility] Ppo Next Appt With Plastic Surgery 02/11/2020 at 3:30 PM  RE: US Liver Biopsy Received: Today Sandi Mariscal, MD  Garth Bigness D OK for US guided liver lesion Bx.   Abd MRI - 9/1 - image 44, series 32.   Cathren Harsh       Previous Messages   ----- Message -----  From: Garth Bigness D  Sent: 12/25/2019  5:18 PM EDT  To: Ir Procedure Requests  Subject: US Liver Biopsy                  Procedure: US Liver Biopsy   Reason: Malignant neoplasm of upper-outer quadrant of right breast in female, estrogen receptor positive, tissue diagnosis for metastasis   History: NM, CT, MRI in computer   Provider: Truitt Merle   Provider Contact: [phone]

## 2019-12-26 ENCOUNTER — Encounter: Payer: Self-pay | Admitting: *Deleted

## 2019-12-30 ENCOUNTER — Telehealth: Payer: Self-pay | Admitting: Hematology

## 2019-12-30 ENCOUNTER — Telehealth: Payer: Self-pay | Admitting: *Deleted

## 2019-12-30 NOTE — Telephone Encounter (Signed)
Scheduled appointments per 9/3 scheduling message. Patient is aware of appointments details.

## 2019-12-30 NOTE — Telephone Encounter (Signed)
Spoke to pt concerning [place] bx of liver. Confirmed appt and need for driver. Denies further questions at this time.

## 2019-12-31 ENCOUNTER — Other Ambulatory Visit: Payer: Self-pay | Admitting: Student

## 2019-12-31 NOTE — Progress Notes (Addendum)
[place]   [phone] [phone]   Clinic Follow up Note   Patient Care Team: Julian Hy, PA-C as PCP - General (Physician Assistant) [name], MD as Consulting Physician (General Surgery) Truitt Merle, MD as Consulting Physician (Hematology) Kyung Rudd, MD as Consulting Physician (Radiation Oncology) Mauro Kaufmann, RN as Oncology Nurse Navigator Rockwell Germany, RN as Oncology Nurse Navigator  Date of Service:  01/02/2020  CHIEF COMPLAINT: F/u of right breast cancer  SUMMARY OF ONCOLOGIC HISTORY: Oncology History Overview Note  Cancer Staging Malignant neoplasm of upper-outer quadrant of right breast in female, estrogen receptor positive ([place]) Staging form: Breast, AJCC 8th Edition - Clinical stage from 11/25/2019: Stage IIA (cT3, cN1, cM0, G2, ER+, PR+, HER2+) - Signed by Truitt Merle, MD on 12/03/2019    Malignant neoplasm of upper-outer quadrant of right breast in female, estrogen receptor positive ([place])  11/13/2019 Mammogram    Diagnostic Mammogram 11/13/19  IMPRESSION: -Highly suspicious mass and calcifications in the right breast centered between 9 and 12 o'clock spanning up to 11 cm.  -Multiple masses in the right axilla. One of the masses contains calcifications, denoted as mass number 2 measuring 9 x 11 by 10 mm. Several of these masses do not appear to represent definitive lymph nodes. Others may be small but abnormal appearing lymph nodes. Taken in total, a cluster of masses in the right axilla spans up to 3.7 cm.  -There are fibrocystic changes on the left. There are also some solid-appearing masses. A solid appearing mass at 10:30, 6 cm from the nipple measures 9 x 6 x 6 mm. An adjacent solid mass at 10 o'clock, 6 cm from the nipple measures 8 x 6 by 5 mm. Another solid-appearing mass at 11 o'clock, 1 cm from the nipple measures 5 x 4 by 5 mm. Fibrocystic changes are seen at 10 o'clock, 12 o'clock, and 4 o'clock.  Another solid-appearing mass seen at 5 o'clock, 4 cm from the nipple measuring 6 x 3 x 4 mm. There is a single abnormal node in the left axilla with a cortex measuring 5 mm and restriction of the fatty hilum.   11/25/2019 Initial Biopsy   Diagnosis 1. Breast, right, needle core biopsy, upper outer - INVASIVE MAMMARY CARCINOMA - MAMMARY CARCINOMA IN SITU WITH CALCIFICATIONS AND NECROSIS - SEE COMMENT 2. Lymph node, needle/core biopsy, right axilla - INVASIVE MAMMARY CARCINOMA - SEE COMMENT 3. Lymph node, needle/core biopsy, left axilla - BENIGN LYMPH NODE - NO CARCINOMA IDENTIFIED Microscopic Comment 1. The biopsy material shows an infiltrative proliferation of cells arranged linearly and in small clusters. Based on the biopsy, the carcinoma appears Nottingham grade 2 of 3 and measures 1.2 cm in greatest linear extent. E-cadherin and prognostic markers (ER/PR/ki-67/HER2)are pending and will be reported in an addendum. Dr. Tresa Moore reviewed the case and agrees with the above diagnosis. These results were called to [facility] on November 26, 2019. 2. Definitive nodal tissue is not identified. This biopsy has a slightly different architecture morphology than what is seen in part 1. The carcinoma measures 0.7 cm in greatest dimension. E-cadherin and prognostic markers (ER, PR, Ki-67 and HER-2) are pending and will be reported in an addendum.   11/25/2019 Receptors her2   1. PROGNOSTIC INDICATORS Results: IMMUNOHISTOCHEMICAL AND MORPHOMETRIC ANALYSIS PERFORMED MANUALLY The tumor cells are POSITIVE for Her2 (3+). Estrogen Receptor: 90%, POSITIVE, STRONG STAINING INTENSITY Progesterone Receptor: 20%, POSITIVE, STRONG STAINING INTENSITY Proliferation Marker Ki67: 30%  2. PROGNOSTIC INDICATORS Results: IMMUNOHISTOCHEMICAL AND MORPHOMETRIC ANALYSIS PERFORMED MANUALLY The tumor cells are POSITIVE for Her2 (3+). Estrogen Receptor: 90%, POSITIVE, STRONG STAINING  INTENSITY Progesterone Receptor: 25%, POSITIVE, STRONG STAINING INTENSITY Proliferation Marker Ki67: 30%      11/25/2019 Cancer Staging   Staging form: Breast, AJCC 8th Edition - Clinical stage from 11/25/2019: Stage IIA (cT3, cN1, cM0, G2, ER+, PR+, HER2+) - Signed by Truitt Merle, MD on 12/03/2019   12/01/2019 Initial Diagnosis   Malignant neoplasm of upper-outer quadrant of right breast in female, estrogen receptor positive ([place])    Neo-Adjuvant Chemotherapy   PENDING neo-adjuvant TCHP (chemo Docetaxel and Carboplatin with Antibody Herceptin and Perjeta) q3weeks for 6 cycles followed by maintenance Herceptin and Perjeta to complete 1 year treatment.    12/10/2019 Imaging   Bone Scan  IMPRESSION: Findings concerning for bony metastatic disease in the left acetabulum region, L3 vertebral body region, and anterolateral right tenth rib.   Increased uptake in several large joints likely is of arthropathic etiology.   12/11/2019 Imaging   CT CAP w contrast  IMPRESSION: Prominent glandular tissue in the central right breast with nipple retraction, likely corresponding to the patient's known right breast neoplasm.   Prominent right axillary nodes measuring up to 8 mm short axis, suspicious for nodal metastases in this clinical context.   3 mm subpleural pulmonary nodule in the right lower lobe, likely benign. However, follow-up CT chest is suggested in 3-6 months.   Multiple hepatic lesions, including a dominant 2.4 cm lesion in segment 6, which is considered indeterminate. Metastasis is not excluded. Consider MRI abdomen with/without contrast for further characterization.   11 mm sclerotic lesion along the left posterior aspect of the T7 vertebral body raises concern for isolated osseous metastasis. Correlate with priors if available. Benign vertebral hemangioma at T11.   12/12/2019 Breast MRI   IMPRESSION: 1. The biopsy proven malignancy in the right breast involves all  4 quadrants and spans up to 9 cm by MRI. There is enhancement within the skin at the level of the nipple.   2. Multiple matted masses are seen in the right axilla, either abnormal metastatic lymph nodes or a combination of masses and metastatic lymph nodes. One of these has been previously biopsied showing invasive mammary carcinoma without definitive nodal tissue.   3.  There is a 1.5 cm Rotter's lymph node on the right.   4. There is markedly abnormal enhancement and edema in the right pectoralis major muscle spanning 6-7 cm, suspicious for metastatic disease.   5. There is diffuse nodular enhancement throughout the left breast, which may correspond with the solid-appearing masses seen on ultrasound. These all have a similar appearance on MRI.   6.  No findings of left axillary lymphadenopathy.   12/15/2019 Procedure   PAC placed    12/17/2019 -  Chemotherapy   neo-adjuvant TCHP (chemo Docetaxel and Carboplatin with Antibody Herceptin and Perjeta) q3weeks starting 12/17/19 for 6 cycles followed by maintenance Herceptin and Perjeta to complete 1 year treatment.   12/24/2019 Imaging   MRI abdomen  IMPRESSION: 1. Multifocal enhancing bone lesions are noted within the lumbar spine, and bony pelvis compatible with metastatic disease. 2. Four, small peripherally enhancing cystic lesions are noted within the liver worrisome for metastatic disease. 3. 2 enhancing liver lesions are also noted with signal and enhancement characteristics consistent with benign liver hemangioma. 4. Well-circumscribed, enhancing T2 and T1 hypointense structure within the right adnexal region is indeterminate. It is unclear whether not  this is arising from the right ovary, broad ligament or right side of uterine fundus. Differential considerations include fibrothecoma, versus pedunculated uterine leiomyoma. Metastatic disease is considered less favored. Attention on follow-up imaging is recommended.      12/24/2019 Imaging   MRI pelvis  IMPRESSION: 1. Multifocal enhancing bone lesions are noted within the lumbar spine, and bony pelvis compatible with metastatic disease. 2. Four, small peripherally enhancing cystic lesions are noted within the liver worrisome for metastatic disease. 3. 2 enhancing liver lesions are also noted with signal and enhancement characteristics consistent with benign liver hemangioma. 4. Well-circumscribed, enhancing T2 and T1 hypointense structure within the right adnexal region is indeterminate. It is unclear whether not this is arising from the right ovary, broad ligament or right side of uterine fundus. Differential considerations include fibrothecoma, versus pedunculated uterine leiomyoma. Metastatic disease is considered less favored. Attention on follow-up imaging is recommended.      CURRENT THERAPY:  neo-adjuvant TCHP q3weeks starting 12/17/19 for 6 cycles followed by maintenance Herceptin and Perjeta to complete 1 year treatment.  INTERVAL HISTORY:  Carolyn Stewart is here for a follow up and treatment. She presents to the clinic alone. She notes her biopsy went well but 2 hours after biopsy she had significant pain in her abdomen and back that felt worse than contractions. She needed percocet. After pain improved it would flare with movement. Today her much improved.     REVIEW OF SYSTEMS:   Constitutional: Denies fevers, chills or abnormal weight loss Eyes: Denies blurriness of vision Ears, nose, mouth, throat, and face: Denies mucositis or sore throat Respiratory: Denies cough, dyspnea or wheezes Cardiovascular: Denies palpitation, chest discomfort or lower extremity swelling Gastrointestinal:  Denies nausea, heartburn or change in bowel habits Skin: Denies abnormal skin rashes Lymphatics: Denies new lymphadenopathy or easy bruising Neurological:Denies numbness, tingling or new weaknesses Behavioral/Psych: Mood is stable, no new changes  All  other systems were reviewed with the patient and are negative.  MEDICAL HISTORY:  Past Medical History:  Diagnosis Date  . Cancer ([place]) 2021  . Hypertension     SURGICAL HISTORY: Past Surgical History:  Procedure Laterality Date  . left parotid tumor removal   2006  . PORTACATH PLACEMENT Left 12/15/2019   Procedure: INSERTION PORT-A-CATH WITH ULTRASOUND GUIDANCE;  Surgeon: [name], MD;  Location: [facility];  Service: General;  Laterality: Left;  . TONSILLECTOMY      I have reviewed the social history and family history with the patient and they are unchanged from previous note.  ALLERGIES:  has No Known Allergies.  MEDICATIONS:  Current Outpatient Medications  Medication Sig Dispense Refill  . acetaminophen (TYLENOL) 500 MG tablet Take 1,000 mg by mouth every 6 (six) hours as needed for moderate pain or headache.    Marland Kitchen amLODipine (NORVASC) 10 MG tablet Take 10 mg by mouth daily.    . Cholecalciferol (VITAMIN D3) 50 MCG (2000 UT) TABS Take 2,000 Units by mouth daily.     Marland Kitchen dexamethasone (DECADRON) 4 MG tablet Take 2 tablets (8 mg total) by mouth 2 (two) times daily. Start the day before Taxotere. Then take daily x 3 days after chemotherapy. 30 tablet 1  . lidocaine-prilocaine (EMLA) cream Apply to affected area once 30 g 3  . metoprolol succinate (TOPROL-XL) 50 MG 24 hr tablet Take 50 mg by mouth daily.    Marland Kitchen omeprazole (PRILOSEC OTC) 20 MG tablet Take 20 mg by mouth daily.    . ondansetron (ZOFRAN) 8 MG tablet  Take 1 tablet (8 mg total) by mouth 2 (two) times daily as needed (Nausea or vomiting). Start on the third day after chemotherapy. 30 tablet 1  . oxyCODONE (OXY IR/ROXICODONE) 5 MG immediate release tablet Take 1 tablet (5 mg total) by mouth every 6 (six) hours as needed for severe pain. (Patient not taking: Reported on 12/30/2019) 10 tablet 0  . prochlorperazine (COMPAZINE) 10 MG tablet Take 1 tablet (10 mg total) by mouth every 6 (six) hours as needed (Nausea or vomiting). 30  tablet 1   No current facility-administered medications for this visit.    PHYSICAL EXAMINATION: ECOG PERFORMANCE STATUS: 1 - Symptomatic but completely ambulatory  Vitals:   01/02/20 1317  BP: (!) 148/86  Pulse: 87  Resp: 18  Temp: 97.8 F (36.6 C)  SpO2: 98%   Filed Weights   01/02/20 1317  Weight: 196 lb 4.8 oz (89 kg)    Due to COVID19 we will limit examination to appearance. Patient had no complaints.  GENERAL:alert, no distress and comfortable SKIN: skin color normal, no rashes or significant lesions EYES: normal, Conjunctiva are pink and non-injected, sclera clear  NEURO: alert & oriented x 3 with fluent speech   LABORATORY DATA:  I have reviewed the data as listed CBC Latest Ref Rng & Units 01/02/2020 01/01/2020 12/09/2019  WBC 4.0 - 10.5 K/uL 11.5(H) 9.7 6.1  Hemoglobin 12.0 - 15.0 g/dL 10.0(L) 13.1 13.2  Hematocrit 36 - 46 % 30.9(L) 42.4 42.4  Platelets 150 - 400 K/uL 286 345 353     CMP Latest Ref Rng & Units 01/02/2020 12/09/2019 12/03/2019  Glucose 70 - 99 mg/dL 103(H) 108(H) 92  BUN 6 - 20 mg/dL 26(H) 17 16  Creatinine 0.44 - 1.00 mg/dL 1.05(H) 0.97 0.88  Sodium 135 - 145 mmol/L 136 141 142  Potassium 3.5 - 5.1 mmol/L 3.7 3.3(L) 3.9  Chloride 98 - 111 mmol/L 102 103 108  CO2 22 - 32 mmol/L '26 29 26  ' Calcium 8.9 - 10.3 mg/dL 9.5 9.5 9.6  Total Protein 6.5 - 8.1 g/dL 7.1 - 7.6  Total Bilirubin 0.3 - 1.2 mg/dL 0.7 - 0.3  Alkaline Phos 38 - 126 U/L 202(H) - 141(H)  AST 15 - 41 U/L 112(H) - 22  ALT 0 - 44 U/L 135(H) - 23      RADIOGRAPHIC STUDIES: I have personally reviewed the radiological images as listed and agreed with the findings in the report. US BIOPSY (LIVER)  Result Date: 01/01/2020 INDICATION: History of breast cancer, now with indeterminate liver lesions worrisome for metastatic disease. Please perform ultrasound-guided liver lesion biopsy for tissue diagnostic purposes. EXAM: ULTRASOUND GUIDED LIVER LESION BIOPSY COMPARISON:  Abdominal  MRI-12/24/2019; CT abdomen and pelvis-12/11/2019 MEDICATIONS: None ANESTHESIA/SEDATION: Fentanyl 50 mcg IV; Versed 1 mg IV Total Moderate Sedation time:  16 Minutes. The patient's level of consciousness and vital signs were monitored continuously by radiology nursing throughout the procedure under my direct supervision. COMPLICATIONS: None immediate. PROCEDURE: Informed written consent was obtained from the patient after a discussion of the risks, benefits and alternatives to treatment. The patient understands and consents the procedure. A timeout was performed prior to the initiation of the procedure. Ultrasound scanning was performed of the right upper abdominal quadrant demonstrates punctate (approximately 0.7 x 0.7 cm) hypoechoic/anechoic nodule within the anterior aspect of the dome of the right lobe of the liver (image 7), correlating with the lesion seen on preceding abdominal CT image 41, series 2 and the worrisome lesions seen on abdominal  MRI image 36, series 34. The procedure was planned. The right upper abdominal quadrant was prepped and draped in the usual sterile fashion. The overlying soft tissues were anesthetized with 1% lidocaine with epinephrine. A 17 gauge, 6.8 cm co-axial needle was advanced into a peripheral aspect of the lesion. This was followed by 6 core biopsies with an 18 gauge core device under direct ultrasound guidance. The coaxial needle tract was embolized with a small amount of Gel-Foam slurry and superficial hemostasis was obtained with manual compression. Post procedural scanning was negative for definitive area of hemorrhage or additional complication. A dressing was placed. The patient tolerated the procedure well without immediate post procedural complication. IMPRESSION: Technically successful ultrasound guided core needle biopsy of punctate (approximately 0.7 cm) hypoechoic/anechoic nodule within the anterior aspect of the dome of the right lobe of the liver. If this biopsy  proves nondiagnostic secondary to the small size of the nodule, would recommend preceding image guided biopsy of one of the worrisome osseous lesions within the pelvis, likely lesion involving the anterior aspect of the left ilium seen on preceding pelvic MRI image 12, series 14. Electronically Signed   By: Sandi Mariscal M.D.   On: 01/01/2020 16:57     ASSESSMENT & PLAN:  Carolyn Stewart is a 59 y.o. female with    1.Malignant neoplasm of upper-outer quadrant of right breast,ductal carcinoma,StageIIA, c(T3N1Mx), possible liver and bone mets,  ER+/PR+/HER2+,GradeII-III -She was recently diagnosed in 11/2019 witha largeright breast mass in 4 quadrants measuringup to 11cm and right lymphadenopathy as well as indeterminate masses in left breast and one enlarged left axillar LN. Her right breast and LN biopsy was positive for invasiveductalcarcinoma with components of DCIS, calcifications and necrosis in the breast. Her left axillarynodebiopsy was benign.  -Given liver lesions and bone lesions (left L3, right 10th rib, left pelvis) concerning for metastatic disease she underwent liver biopsy on 01/01/20. I discussed pathology with her which was negative for malignant cells but I still have high suspicion that she has metastatic disease, especially bone mets. I discussed a re-biopsy but given her poor tolerance after liver biopsy, I recommend bone biopsy. She is agreeable.  -She was also seen to have left breast mass on her 12/12/19 MRI. A left breast biopsy would be recommended only if she does not have metastatic breast cancer. Will hold for now.  -her treatment has been delayed due to the metastatic work-up -In the meantime I recommend proceeding with chemo docetaxel, trastuzumab and Perjeta to start treating her disease. I do not recommend TCHP due to her probable metastatic disease and no longer eligible for surgery. Plan to start chemo tomorrow. I reviewed possible side effects with her and  encouraged her to contact clinic for any significant, unexpected symptoms, dehydration or Fever. She voiced good understanding, and agrees to proceed. -Labs reviewed and adequate to proceed with C1 THP tomorrow. She will take dexa tonight and once daily for 3 days after day 1 chemo.  -f/u for toxicity check next week and then with C2   2. HTN -Well controlled on Metoprolol and Amlodipine. Continue to f/u with PCP   3. Severe upper body pain, new anemia and transaminitis, probable intrahepatic bleeding after biopsy -She had severe RUQ abdominal and back pain starting 2 hours after liver biopsy yesterday (01/01/20). She required percocet to reduce her pain to intermittent flares with any movement last night. Today her pain is much improved and manageable.  -Her current constipation is likely secondary to pain medication  use. I recommend she use OTC Miralax.  -Today she has sudden drop of with Hg 10 (from 13.1 yesterday before biopsy). This is likely bleeding from her liver biopsy. Her CMP also showed new transaminitis, total bilirubin is normal. -I reviewed with IR Dr. Pascal Lux who agrees that she probably had bleeding after biopsy, given her much improved pain, bleeding likely has stopped, will observe for now. If her pain or anemia worsens I recommend more work up such as CT wo contrast. Will repeat lab on 9/13. Patient knows to go to ED if she has recurrent severe pain.   PLAN: -Provided work letter about her chemo treatment and work hours.  -Bone lesion Biopsy by IR in about 2 weeks   -Labs reviewed and adequate to proceed with C1 THP tomorrow, her transaminitis is likely related to her intraoperative bleeding after liver biopsy yesterday -Lab on 9/13 to f/u H/H -Lab, flush, F/u with APP on 9/17 -Lab, flush, F/u and THP in 3 weeks    No problem-specific Assessment & Plan notes found for this encounter.   Orders Placed This Encounter  Procedures  . US Abdomen Limited    Standing  Status:   Future    Standing Expiration Date:   01/01/2021    Scheduling Instructions:     Hg drop from 13 to 10 today, pt had severe RUQ pain after biopsy, rule out bleeding    Order Specific Question:   Reason for Exam (SYMPTOM  OR DIAGNOSIS REQUIRED)    Answer:   rule out hematoma in liver after biopsy    Order Specific Question:   Preferred imaging location?    Answer:   [facility]    Order Specific Question:   Release to patient    Answer:   Immediate  . CT Biopsy    Standing Status:   Future    Standing Expiration Date:   01/02/2021    Scheduling Instructions:     Approved by Dr. Pascal Lux    Order Specific Question:   Lab orders requested (DO NOT place separate lab orders, these will be automatically ordered during procedure specimen collection):    Answer:   Surgical Pathology    Order Specific Question:   Reason for Exam (SYMPTOM  OR DIAGNOSIS REQUIRED)    Answer:   Confirm bone metastasis    Order Specific Question:   Is patient pregnant?    Answer:   No    Order Specific Question:   Preferred location?    Answer:   [facility]    Order Specific Question:   Radiology Contrast Protocol - do NOT remove file path    Answer:   [email or web address]   All questions were answered. The patient knows to call the clinic with any problems, questions or concerns. No barriers to learning was detected. The total time spent in the appointment was 40 minutes.     Truitt Merle, MD 01/02/2020   I, Joslyn Devon, am acting as scribe for Truitt Merle, MD.   I have reviewed the above documentation for accuracy and completeness, and I agree with the above.

## 2020-01-01 ENCOUNTER — Ambulatory Visit (HOSPITAL_COMMUNITY)
Admission: RE | Admit: 2020-01-01 | Discharge: 2020-01-01 | Disposition: A | Discharge status: Home / Self Care | Payer: Commercial Managed Care - PPO | Source: Ambulatory Visit | Attending: Hematology | Admitting: Hematology

## 2020-01-01 ENCOUNTER — Other Ambulatory Visit: Payer: Self-pay | Admitting: Hematology

## 2020-01-01 ENCOUNTER — Other Ambulatory Visit: Payer: Self-pay

## 2020-01-01 DIAGNOSIS — K769 Liver disease, unspecified: Secondary | ICD-10-CM | POA: Diagnosis present

## 2020-01-01 DIAGNOSIS — I10 Essential (primary) hypertension: Secondary | ICD-10-CM | POA: Insufficient documentation

## 2020-01-01 DIAGNOSIS — C50411 Malignant neoplasm of upper-outer quadrant of right female breast: Secondary | ICD-10-CM

## 2020-01-01 DIAGNOSIS — Z7901 Long term (current) use of anticoagulants: Secondary | ICD-10-CM | POA: Diagnosis not present

## 2020-01-01 DIAGNOSIS — R110 Nausea: Secondary | ICD-10-CM | POA: Insufficient documentation

## 2020-01-01 DIAGNOSIS — Z79899 Other long term (current) drug therapy: Secondary | ICD-10-CM | POA: Diagnosis not present

## 2020-01-01 DIAGNOSIS — Z170 Estrogen receptor positive status [ER+]: Secondary | ICD-10-CM | POA: Diagnosis not present

## 2020-01-01 DIAGNOSIS — M545 Low back pain: Secondary | ICD-10-CM | POA: Diagnosis not present

## 2020-01-01 DIAGNOSIS — R109 Unspecified abdominal pain: Secondary | ICD-10-CM | POA: Insufficient documentation

## 2020-01-01 LAB — CBC
HCT: 42.4 % (ref 36.0–46.0)
Hemoglobin: 13.1 g/dL (ref 12.0–15.0)
MCH: 29 pg (ref 26.0–34.0)
MCHC: 30.9 g/dL (ref 30.0–36.0)
MCV: 93.8 fL (ref 80.0–100.0)
Platelets: 345 10*3/uL (ref 150–400)
RBC: 4.52 MIL/uL (ref 3.87–5.11)
RDW: 14.5 % (ref 11.5–15.5)
WBC: 9.7 10*3/uL (ref 4.0–10.5)
nRBC: 0 % (ref 0.0–0.2)

## 2020-01-01 LAB — PROTIME-INR
INR: 1 (ref 0.8–1.2)
Prothrombin Time: 13.1 seconds (ref 11.4–15.2)

## 2020-01-01 MED ORDER — MIDAZOLAM HCL 2 MG/2ML IJ SOLN
INTRAMUSCULAR | Status: AC | PRN
  Administered 2020-01-01: 1 mg via INTRAVENOUS

## 2020-01-01 MED ORDER — FENTANYL CITRATE (PF) 100 MCG/2ML IJ SOLN
INTRAMUSCULAR | Status: AC
  Filled 2020-01-01: qty 2

## 2020-01-01 MED ORDER — ONDANSETRON HCL 4 MG PO TABS
4.0000 mg | ORAL_TABLET | Freq: Once | ORAL | Status: AC
  Administered 2020-01-01: 4 mg via ORAL
  Filled 2020-01-01 (×2): qty 1

## 2020-01-01 MED ORDER — OXYCODONE-ACETAMINOPHEN 5-325 MG PO TABS
2.0000 | ORAL_TABLET | Freq: Once | ORAL | Status: AC
  Administered 2020-01-01: 2 via ORAL
  Filled 2020-01-01: qty 2

## 2020-01-01 MED ORDER — MIDAZOLAM HCL 2 MG/2ML IJ SOLN
INTRAMUSCULAR | Status: AC
  Filled 2020-01-01: qty 2

## 2020-01-01 MED ORDER — LIDOCAINE HCL (PF) 1 % IJ SOLN
INTRAMUSCULAR | Status: AC
  Filled 2020-01-01: qty 30

## 2020-01-01 MED ORDER — FENTANYL CITRATE (PF) 100 MCG/2ML IJ SOLN
INTRAMUSCULAR | Status: AC | PRN
  Administered 2020-01-01 (×2): 50 ug via INTRAVENOUS

## 2020-01-01 MED ORDER — SODIUM CHLORIDE 0.9 % IV SOLN: INTRAVENOUS | Status: DC

## 2020-01-01 MED ORDER — GELATIN ABSORBABLE 12-7 MM EX MISC
CUTANEOUS | Status: AC
  Filled 2020-01-01: qty 1

## 2020-01-01 NOTE — Procedures (Signed)
Pre Procedure Dx: Liver lesion  Post Procedural Dx: Same  Technically successful US guided biopsy of indeterminate lesion within the right lobe of the liver.   EBL: None  No immediate complications.   Jay Yasemin Rabon, MD Pager #: [phone]    

## 2020-01-01 NOTE — Progress Notes (Signed)
Patient assessed in short stay for abdominal pain that started during her discharge process. Patient describes pain as diffuse, crampy abdominal pain and some pain in her mid-lower back, 8/10. She states it comes and goes and does feel "gassy". She felt nauseous while I was in the room but she did not vomit. No pain to palpation around her RUQ. Biopsy site is unremarkable. Unable to elicit pain to palpation anywhere on her abdomen. Blood pressure 120/70, HR 91. Per Dr. Pascal Lux, no additional imaging is warranted at this time; the pain/discomfort appears within an acceptable range for the procedure she had done.   PO Zofran and Percocet ordered. Patient stated she felt ok to take oral medications. Ok to discharge home after she receives her medications.   Please call IR with any further questions/concerns.  Carolyn Stewart, [place] [phone] 01/01/2020, 4:40 PM

## 2020-01-01 NOTE — Progress Notes (Signed)
Patient stated that her pain is now a 3. Called PA [place] and she stated that it is ok to let patient go home.

## 2020-01-01 NOTE — Progress Notes (Signed)
After getting up to dress for D/C, pt c/o stomach "cramping". States its 7/10 pain level. Roselyn Reef NP notified and coming to bedside.

## 2020-01-01 NOTE — H&P (Signed)
Chief Complaint: Patient was seen in consultation today for liver lesion biopsy  Referring Physician(s): Truitt Merle  Supervising Physician: Sandi Mariscal  Patient Status: [facility] - Out-pt  History of Present Illness: Ivett Luebbe is a 59 y.o. female with recently diagnosed right breast cancer. She was sent by her doctor for a diagnostic mammogram 11/13/19 when she noticed right breast changes. Multiple masses in both the right and left breasts were identified. Biopsies of a right mass and lymph node were positive for invasive mammary carcinoma. A biopsy of a left lymph node was benign.   Additional imaging was performed and suspicious lesions were seen in the liver, left acetabulum, L3 vertebral body and right 10th rib.     Interventional Radiology has been asked to evaluate this patient for an image-guided liver lesion biopsy. This case has been reviewed and procedure approved by Dr. Pascal Lux.   Past Medical History:  Diagnosis Date  . Cancer ([place]) 2021  . Hypertension     Past Surgical History:  Procedure Laterality Date  . left parotid tumor removal   2006  . PORTACATH PLACEMENT Left 12/15/2019   Procedure: INSERTION PORT-A-CATH WITH ULTRASOUND GUIDANCE;  Surgeon: [name], MD;  Location: [place];  Service: General;  Laterality: Left;  . TONSILLECTOMY      Allergies: Patient has no known allergies.  Medications: Prior to Admission medications   Medication Sig Start Date End Date Taking? Authorizing Provider  acetaminophen (TYLENOL) 500 MG tablet Take 1,000 mg by mouth every 6 (six) hours as needed for moderate pain or headache.   Yes [provider]  amLODipine (NORVASC) 10 MG tablet Take 10 mg by mouth daily. 11/18/19  Yes [provider]  Cholecalciferol (VITAMIN D3) 50 MCG (2000 UT) TABS Take 2,000 Units by mouth daily.    Yes [provider]  metoprolol succinate (TOPROL-XL) 50 MG 24 hr tablet Take 50 mg by mouth daily. 11/18/19  Yes [provider]  omeprazole (PRILOSEC OTC) 20 MG tablet Take 20 mg by mouth daily.   Yes [provider]  dexamethasone (DECADRON) 4 MG tablet Take 2 tablets (8 mg total) by mouth 2 (two) times daily. Start the day before Taxotere. Then take daily x 3 days after chemotherapy. 12/03/19   Truitt Merle, MD  lidocaine-prilocaine (EMLA) cream Apply to affected area once 12/03/19   Truitt Merle, MD  ondansetron (ZOFRAN) 8 MG tablet Take 1 tablet (8 mg total) by mouth 2 (two) times daily as needed (Nausea or vomiting). Start on the third day after chemotherapy. 12/03/19   Truitt Merle, MD  oxyCODONE (OXY IR/ROXICODONE) 5 MG immediate release tablet Take 1 tablet (5 mg total) by mouth every 6 (six) hours as needed for severe pain. Patient not taking: Reported on 12/30/2019 12/15/19   [name], MD  prochlorperazine (COMPAZINE) 10 MG tablet Take 1 tablet (10 mg total) by mouth every 6 (six) hours as needed (Nausea or vomiting). 12/03/19   Truitt Merle, MD     Family History  Problem Relation Age of Onset  . Hypertension Mother   . Hypertension Father     Social History   Socioeconomic History  . Marital status: Married    Spouse name: Not on file  . Number of children: 3  . Years of education: Not on file  . Highest education level: Not on file  Occupational History  . 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 Smoking status: Never Smoker  . Smokeless  tobacco: Never Used  Vaping Use  . Vaping Use: Never used  Substance and Sexual Activity  . Alcohol use: Yes    Comment: social drinker   . Drug use: No  . Sexual activity: Yes  Other Topics Concern  . Not on file  Social History Narrative  . Not on file   Social Determinants of Health   Financial Resource Strain: Low Risk   . Difficulty of Paying Living Expenses: Not hard at all  Food Insecurity: No Food Insecurity  . Worried About Charity fundraiser in the Last Year: Never true  . Ran Out of Food in the Last Year: Never true    Transportation Needs: No Transportation Needs  . Lack of Transportation (Medical): No  . Lack of Transportation (Non-Medical): No  Physical Activity:   . Days of Exercise per Week: Not on file  . Minutes of Exercise per Session: Not on file  Stress:   . Feeling of Stress : Not on file  Social Connections:   . Frequency of Communication with Friends and Family: Not on file  . Frequency of Social Gatherings with Friends and Family: Not on file  . Attends Religious Services: Not on file  . Active Member of Clubs or Organizations: Not on file  . Attends Archivist Meetings: Not on file  . Marital Status: Not on file    Review of Systems: A 12 point ROS discussed and pertinent positives are indicated in the HPI above.  All other systems are negative.  Review of Systems  Constitutional: Negative for appetite change and fatigue.  Respiratory: Negative for cough and shortness of breath.   Cardiovascular: Negative for chest pain and leg swelling.  Gastrointestinal: Negative for abdominal pain, diarrhea, nausea and vomiting.  Musculoskeletal: Positive for back pain.       Lower back pain, intermittent/chronic  Neurological: Negative for headaches.    Vital Signs: BP (!) 146/92   Pulse 95   Temp 98.7 F (37.1 C) (Oral)   Resp 16   Ht 5\' 7"  (1.702 m)   Wt 190 lb (86.2 kg)   LMP 01/19/2013   SpO2 96%   BMI 29.76 kg/m   Physical Exam Constitutional:      General: She is not in acute distress. HENT:     Mouth/Throat:     Mouth: Mucous membranes are moist.     Pharynx: Oropharynx is clear.  Cardiovascular:     Rate and Rhythm: Normal rate and regular rhythm.     Pulses: Normal pulses.     Heart sounds: Normal heart sounds.     Comments: Left upper chest port-a-catheter. Skin glue and small bandage in place. No erythema, swelling or drainage.  Pulmonary:     Effort: Pulmonary effort is normal.     Breath sounds: Normal breath sounds.  Abdominal:     General: Bowel  sounds are normal.     Palpations: Abdomen is soft.  Musculoskeletal:        General: Normal range of motion.  Skin:    General: Skin is warm and dry.  Neurological:     Mental Status: She is alert and oriented to person, place, and time.     Imaging: CT Chest W Contrast  Result Date: 12/12/2019 CLINICAL DATA:  Newly diagnosed right breast cancer EXAM: CT CHEST, ABDOMEN, AND PELVIS WITH CONTRAST TECHNIQUE: Multidetector CT imaging of the chest, abdomen and pelvis was performed following the standard protocol during bolus administration of intravenous  contrast. CONTRAST:  115mL ISOVUE-300 IOPAMIDOL (ISOVUE-300) INJECTION 61% COMPARISON:  None. FINDINGS: CT CHEST FINDINGS Cardiovascular: Heart is normal in size.  No pericardial effusion. No evidence of thoracic aortic aneurysm. Mediastinum/Nodes: No suspicious mediastinal, hilar, or left axillary lymphadenopathy. Clustered prominent right axillary nodes measuring up to 9 mm short axis (series 2/image 12), suspicious in this clinical context. Visualized thyroid is unremarkable. Lungs/Pleura: 3 mm subpleural nodule in the anterior right lower lobe (series 6/image 51). Minimal dependent atelectasis in the bilateral lower lobes. No focal consolidation. No pleural effusion or pneumothorax. Musculoskeletal: Prominent glandular tissue in the central right breast with nipple retraction (series 2/image 23), likely corresponding to the patient's known primary breast neoplasm. 11 mm sclerotic lesion along the left posterior aspect of the T7 vertebral body (sagittal image 108), raising concern for possible osseous metastasis. Vertebral hemangioma in the T11 vertebral body. CT ABDOMEN PELVIS FINDINGS Hepatobiliary: 2.4 cm peripheral enhancing lesion in segment 6 (series 2/image 45), indeterminate, metastasis not excluded. Additional scattered lesions measuring up to 10 mm in segment 2 (series 2/image 36), some of which may reflect benign cysts, but are too small to  characterize. Gallbladder is unremarkable. No intrahepatic or extrahepatic ductal dilatation. Pancreas: Within normal limits. Spleen: Within normal limits. Adrenals/Urinary Tract: Adrenal glands are within normal limits. Kidneys are within normal limits.  No hydronephrosis. Bladder is within normal limits. Stomach/Bowel: Stomach is within normal limits. No evidence of bowel obstruction. Normal appendix (series 2/image 69). Vascular/Lymphatic: No evidence of abdominal aortic aneurysm. Atherosclerotic calcifications of the abdominal aorta and branch vessels. No suspicious abdominopelvic lymphadenopathy. Reproductive: Heterogeneous uterus with a suspected 3.5 cm pedunculated fibroid along the right adnexa, although a right adnexal mass is also possible. Adjacent right ovary (series 2/image 88) is otherwise within normal limits. Left ovary is within normal limits. Other: No abdominopelvic ascites. Musculoskeletal: Degenerative changes at L3-4. IMPRESSION: Prominent glandular tissue in the central right breast with nipple retraction, likely corresponding to the patient's known right breast neoplasm. Prominent right axillary nodes measuring up to 8 mm short axis, suspicious for nodal metastases in this clinical context. 3 mm subpleural pulmonary nodule in the right lower lobe, likely benign. However, follow-up CT chest is suggested in 3-6 months. Multiple hepatic lesions, including a dominant 2.4 cm lesion in segment 6, which is considered indeterminate. Metastasis is not excluded. Consider MRI abdomen with/without contrast for further characterization. 11 mm sclerotic lesion along the left posterior aspect of the T7 vertebral body raises concern for isolated osseous metastasis. Correlate with priors if available. Benign vertebral hemangioma at T11. Electronically Signed   By: Julian Hy M.D.   On: 12/12/2019 07:50   MR Pelvis W Wo Contrast  Result Date: 12/24/2019 CLINICAL DATA:  History of metastatic breast  cancer. Evaluate for liver metastasis in bone metastases. EXAM: MRI ABDOMEN AND PELVIS WITHOUT AND WITH CONTRAST TECHNIQUE: Multiplanar multisequence MR imaging of the abdomen and pelvis was performed both before and after the administration of intravenous contrast. CONTRAST:  38mL GADAVIST GADOBUTROL 1 MMOL/ML IV SOLN COMPARISON:  CT cap with contrast 12/11/2019 FINDINGS: COMBINED FINDINGS FOR BOTH MR ABDOMEN AND PELVIS Lower chest: No acute findings. Hepatobiliary: Corresponding to the recent CT abnormality are multiple T2 hyperintense liver lesions. The dominant lesion is in the posterior right hepatic lobe measuring 2 cm, image 16/19. This has enhancement characteristics consistent with a benign hemangioma. Similarly, there is a lesion within the posteromedial right lobe of liver which measures 1.2 cm, image 22/19. This also has enhancement characteristics compatible  with a benign hemangioma. Four T2 hyperintense lesions are identified which on the postcontrast images exhibit peripheral enhancement without delayed fill-in, suspicious for metastatic lesions. The index lesion within segment 8/4 measures 1.2 cm, image 28/32. Index lesion within segment 4a measures 0.9 cm, image 38/32. Index lesion within segment 7/8 measures 4 mm, image 29/32. Index lesion within segment 2 measures 0.8 cm, image 28/34. Gallbladder is normal. No signs of gallbladder inflammation. No bile duct dilatation. Pancreas: No mass, inflammatory changes, or other parenchymal abnormality identified. Spleen:  Within normal limits in size and appearance. Adrenals/Urinary Tract: Normal appearance of the adrenal glands. No enhancing kidney mass or hydronephrosis. Several tiny, subcentimeter T2 hyperintense foci are noted within both kidneys which are too small to reliably characterize. Urinary bladder is unremarkable. Stomach/Bowel: Visualized portions within the abdomen are unremarkable. Vascular/Lymphatic: No aneurysm. No abdominal adenopathy. No  pelvic or inguinal adenopathy identified. Reproductive: Posterior intramural fibroid is identified within the uterus measuring 1.4 cm. Well-circumscribed, mixed signal intensity structure within the right adnexal region IS no internal focus of face indeterminate. This appears T1 hypointense and T2 hypointense with heterogeneous internal enhancement, image 21/41. It is unclear whether not this is arising from the right ovary, broad ligament or right side of fundus. Other:  No ascites identified. Musculoskeletal: Multifocal enhancing bone lesions are noted involving the bony pelvis. The largest lesion there is in the left posterior column of the left acetabulum measuring 2.8 cm, image 29/4. Within the right superior acetabulum there is a 1.5 cm enhancing lesion, image 23/41. Adjacent enhancing lesion measures 1.1 cm, image 21/4. Enhancing lesion within the left iliac wing measures 2.3 cm, image 13/41. Additional suspicious areas within the sacrum noted, image 14/42. Multiple enhancing lesions are noted within the lumbar spine. The largest localizes to the L3 vertebral body measuring 2.4 cm, image 66/32. IMPRESSION: 1. Multifocal enhancing bone lesions are noted within the lumbar spine, and bony pelvis compatible with metastatic disease. 2. Four, small peripherally enhancing cystic lesions are noted within the liver worrisome for metastatic disease. 3. 2 enhancing liver lesions are also noted with signal and enhancement characteristics consistent with benign liver hemangioma. 4. Well-circumscribed, enhancing T2 and T1 hypointense structure within the right adnexal region is indeterminate. It is unclear whether not this is arising from the right ovary, broad ligament or right side of uterine fundus. Differential considerations include fibrothecoma, versus pedunculated uterine leiomyoma. Metastatic disease is considered less favored. Attention on follow-up imaging is recommended. Electronically Signed   By: Kerby Moors M.D.   On: 12/24/2019 12:22   MR Abdomen W Wo Contrast  Result Date: 12/24/2019 CLINICAL DATA:  History of metastatic breast cancer. Evaluate for liver metastasis in bone metastases. EXAM: MRI ABDOMEN AND PELVIS WITHOUT AND WITH CONTRAST TECHNIQUE: Multiplanar multisequence MR imaging of the abdomen and pelvis was performed both before and after the administration of intravenous contrast. CONTRAST:  55mL GADAVIST GADOBUTROL 1 MMOL/ML IV SOLN COMPARISON:  CT cap with contrast 12/11/2019 FINDINGS: COMBINED FINDINGS FOR BOTH MR ABDOMEN AND PELVIS Lower chest: No acute findings. Hepatobiliary: Corresponding to the recent CT abnormality are multiple T2 hyperintense liver lesions. The dominant lesion is in the posterior right hepatic lobe measuring 2 cm, image 16/19. This has enhancement characteristics consistent with a benign hemangioma. Similarly, there is a lesion within the posteromedial right lobe of liver which measures 1.2 cm, image 22/19. This also has enhancement characteristics compatible with a benign hemangioma. Four T2 hyperintense lesions are identified which on the postcontrast images  exhibit peripheral enhancement without delayed fill-in, suspicious for metastatic lesions. The index lesion within segment 8/4 measures 1.2 cm, image 28/32. Index lesion within segment 4a measures 0.9 cm, image 38/32. Index lesion within segment 7/8 measures 4 mm, image 29/32. Index lesion within segment 2 measures 0.8 cm, image 28/34. Gallbladder is normal. No signs of gallbladder inflammation. No bile duct dilatation. Pancreas: No mass, inflammatory changes, or other parenchymal abnormality identified. Spleen:  Within normal limits in size and appearance. Adrenals/Urinary Tract: Normal appearance of the adrenal glands. No enhancing kidney mass or hydronephrosis. Several tiny, subcentimeter T2 hyperintense foci are noted within both kidneys which are too small to reliably characterize. Urinary bladder is  unremarkable. Stomach/Bowel: Visualized portions within the abdomen are unremarkable. Vascular/Lymphatic: No aneurysm. No abdominal adenopathy. No pelvic or inguinal adenopathy identified. Reproductive: Posterior intramural fibroid is identified within the uterus measuring 1.4 cm. Well-circumscribed, mixed signal intensity structure within the right adnexal region IS no internal focus of face indeterminate. This appears T1 hypointense and T2 hypointense with heterogeneous internal enhancement, image 21/41. It is unclear whether not this is arising from the right ovary, broad ligament or right side of fundus. Other:  No ascites identified. Musculoskeletal: Multifocal enhancing bone lesions are noted involving the bony pelvis. The largest lesion there is in the left posterior column of the left acetabulum measuring 2.8 cm, image 29/4. Within the right superior acetabulum there is a 1.5 cm enhancing lesion, image 23/41. Adjacent enhancing lesion measures 1.1 cm, image 21/4. Enhancing lesion within the left iliac wing measures 2.3 cm, image 13/41. Additional suspicious areas within the sacrum noted, image 14/42. Multiple enhancing lesions are noted within the lumbar spine. The largest localizes to the L3 vertebral body measuring 2.4 cm, image 66/32. IMPRESSION: 1. Multifocal enhancing bone lesions are noted within the lumbar spine, and bony pelvis compatible with metastatic disease. 2. Four, small peripherally enhancing cystic lesions are noted within the liver worrisome for metastatic disease. 3. 2 enhancing liver lesions are also noted with signal and enhancement characteristics consistent with benign liver hemangioma. 4. Well-circumscribed, enhancing T2 and T1 hypointense structure within the right adnexal region is indeterminate. It is unclear whether not this is arising from the right ovary, broad ligament or right side of uterine fundus. Differential considerations include fibrothecoma, versus pedunculated uterine  leiomyoma. Metastatic disease is considered less favored. Attention on follow-up imaging is recommended. Electronically Signed   By: Kerby Moors M.D.   On: 12/24/2019 12:22   NM Bone Scan Whole Body  Result Date: 12/11/2019 CLINICAL DATA:  Breast carcinoma EXAM: NUCLEAR MEDICINE WHOLE BODY BONE SCAN TECHNIQUE: Whole body anterior and posterior images were obtained approximately 3 hours after intravenous injection of radiopharmaceutical. RADIOPHARMACEUTICALS:  2.1 mCi Technetium-56m MDP IV COMPARISON:  None. FINDINGS: There is increased radiotracer uptake in the left acetabular region. There is also increased uptake at L3 in the lumbar region. There is increased uptake in the anterolateral right tenth rib. Foci of increased uptake in shoulders, knees, ankles is likely of arthropathic etiology. Kidneys are located in the flank positions bilaterally. IMPRESSION: Findings concerning for bony metastatic disease in the left acetabulum region, L3 vertebral body region, and anterolateral right tenth rib. Increased uptake in several large joints likely is of arthropathic etiology. Electronically Signed   By: Lowella Grip III M.D.   On: 12/11/2019 13:07   CT Abdomen Pelvis W Contrast  Result Date: 12/12/2019 CLINICAL DATA:  Newly diagnosed right breast cancer EXAM: CT CHEST, ABDOMEN, AND PELVIS WITH CONTRAST  TECHNIQUE: Multidetector CT imaging of the chest, abdomen and pelvis was performed following the standard protocol during bolus administration of intravenous contrast. CONTRAST:  179mL ISOVUE-300 IOPAMIDOL (ISOVUE-300) INJECTION 61% COMPARISON:  None. FINDINGS: CT CHEST FINDINGS Cardiovascular: Heart is normal in size.  No pericardial effusion. No evidence of thoracic aortic aneurysm. Mediastinum/Nodes: No suspicious mediastinal, hilar, or left axillary lymphadenopathy. Clustered prominent right axillary nodes measuring up to 9 mm short axis (series 2/image 12), suspicious in this clinical context.  Visualized thyroid is unremarkable. Lungs/Pleura: 3 mm subpleural nodule in the anterior right lower lobe (series 6/image 51). Minimal dependent atelectasis in the bilateral lower lobes. No focal consolidation. No pleural effusion or pneumothorax. Musculoskeletal: Prominent glandular tissue in the central right breast with nipple retraction (series 2/image 23), likely corresponding to the patient's known primary breast neoplasm. 11 mm sclerotic lesion along the left posterior aspect of the T7 vertebral body (sagittal image 108), raising concern for possible osseous metastasis. Vertebral hemangioma in the T11 vertebral body. CT ABDOMEN PELVIS FINDINGS Hepatobiliary: 2.4 cm peripheral enhancing lesion in segment 6 (series 2/image 45), indeterminate, metastasis not excluded. Additional scattered lesions measuring up to 10 mm in segment 2 (series 2/image 36), some of which may reflect benign cysts, but are too small to characterize. Gallbladder is unremarkable. No intrahepatic or extrahepatic ductal dilatation. Pancreas: Within normal limits. Spleen: Within normal limits. Adrenals/Urinary Tract: Adrenal glands are within normal limits. Kidneys are within normal limits.  No hydronephrosis. Bladder is within normal limits. Stomach/Bowel: Stomach is within normal limits. No evidence of bowel obstruction. Normal appendix (series 2/image 69). Vascular/Lymphatic: No evidence of abdominal aortic aneurysm. Atherosclerotic calcifications of the abdominal aorta and branch vessels. No suspicious abdominopelvic lymphadenopathy. Reproductive: Heterogeneous uterus with a suspected 3.5 cm pedunculated fibroid along the right adnexa, although a right adnexal mass is also possible. Adjacent right ovary (series 2/image 88) is otherwise within normal limits. Left ovary is within normal limits. Other: No abdominopelvic ascites. Musculoskeletal: Degenerative changes at L3-4. IMPRESSION: Prominent glandular tissue in the central right breast  with nipple retraction, likely corresponding to the patient's known right breast neoplasm. Prominent right axillary nodes measuring up to 8 mm short axis, suspicious for nodal metastases in this clinical context. 3 mm subpleural pulmonary nodule in the right lower lobe, likely benign. However, follow-up CT chest is suggested in 3-6 months. Multiple hepatic lesions, including a dominant 2.4 cm lesion in segment 6, which is considered indeterminate. Metastasis is not excluded. Consider MRI abdomen with/without contrast for further characterization. 11 mm sclerotic lesion along the left posterior aspect of the T7 vertebral body raises concern for isolated osseous metastasis. Correlate with priors if available. Benign vertebral hemangioma at T11. Electronically Signed   By: Julian Hy M.D.   On: 12/12/2019 07:50   MR BREAST BILATERAL W WO CONTRAST INC CAD  Result Date: 12/12/2019 CLINICAL DATA:  59 year old female who presented to the [facility] 11/13/2019 with changes to her right breast. On her diagnostic workup, a suspicious 11 cm mass was found in the right breast. Multiple right axillary masses were seen as well. Multiple small masses were described in the left breast. MRI was recommended to determine if there is one or more of these masses in the left breast which should be specifically targeted for biopsy. Ultrasound-guided biopsy 11/25/2019 found grade 2 invasive mammary carcinoma with mammary carcinoma in situ with calcifications and necrosis of the right breast. Biopsy of a right axillary mass demonstrated invasive mammary carcinoma without definitive nodal tissue identified.  Pathology of a biopsied left axillary lymph node was benign. LABS:  Creatinine of 0.97 mg/dL and GFR of greater than 60 on 12/09/2019 EXAM: BILATERAL BREAST MRI WITH AND WITHOUT CONTRAST TECHNIQUE: Multiplanar, multisequence MR images of both breasts were obtained prior to and following the intravenous administration of 8 ml  of Gadavist Three-dimensional MR images were rendered by post-processing of the original MR data on an independent workstation. The three-dimensional MR images were interpreted, and findings are reported in the following complete MRI report for this study. Three dimensional images were evaluated at the independent interpreting workstation using the DynaCAD thin client. COMPARISON:  No prior MRI available for comparison. Correlation made with prior mammograms and ultrasound images. FINDINGS: Breast composition: c. Heterogeneous fibroglandular tissue. Background parenchymal enhancement: Moderate. Right breast: There is diffuse abnormal enhancement throughout the right breast involving all 4 quadrants with predominantly persistent kinetics. The enhancement measures 9.0 x 6.6 x 9.0 cm. Marked nipple retraction is noted with some mild diffuse skin thickening. There is enhancement within the skin at the level of the nipple (series 5 and 6, image 93). There is marked edema, expansion and enhancement within the pectoralis major musculature (series 8, image 49). The enhancement within the muscle measures at least 6-7 cm. Left breast: There is diffuse nodular non mass enhancement throughout the left breast. All of this enhancement demonstrates persistent kinetics, similar to the known cancer on the right. There is a cluster of this nodular enhancement in the upper-outer quadrant of the left breast (series 6, image 79) and an additional prominent cluster in the lower outer quadrant (series 6, image 104). Lymph nodes: Multiple matted masses are seen in the right axilla, at least 5-6, which may represent abnormal lymph nodes versus primary tumor. There is one abnormal-appearing Rotter's node measuring 1.5 cm in long axis (series 5, image 37) Ancillary findings: Abnormal enhancement in the superolateral right pectoralis major muscle as described under the "right breast" section. IMPRESSION: 1. The biopsy proven malignancy in the  right breast involves all 4 quadrants and spans up to 9 cm by MRI. There is enhancement within the skin at the level of the nipple. 2. Multiple matted masses are seen in the right axilla, either abnormal metastatic lymph nodes or a combination of masses and metastatic lymph nodes. One of these has been previously biopsied showing invasive mammary carcinoma without definitive nodal tissue. 3.  There is a 1.5 cm Rotter's lymph node on the right. 4. There is markedly abnormal enhancement and edema in the right pectoralis major muscle spanning 6-7 cm, suspicious for metastatic disease. 5. There is diffuse nodular enhancement throughout the left breast, which may correspond with the solid-appearing masses seen on ultrasound. These all have a similar appearance on MRI. 6.  No findings of left axillary lymphadenopathy. RECOMMENDATION: 1. Ultrasound-guided biopsy is recommended for 3 of the left breast masses on ultrasound, to include the largest mass at 10:30, the irregular vascular mass at 11 o'clock, and the mass at 5 o'clock. 2. MRI guided biopsy may be performed of the left breast following ultrasound-guided biopsies, for the nodular enhancement in the upper outer posterior left breast. MRI guided biopsy may be considered for the nodular enhancement in the lower outer quadrant of the left breast if this is necessary depending on the ultrasound biopsy results and recommendaions. BI-RADS CATEGORY  4: Suspicious. Electronically Signed   By: Ammie Ferrier M.D.   On: 12/12/2019 14:29   DG CHEST PORT 1 VIEW  Result Date: 12/15/2019 CLINICAL  DATA:  Breast carcinoma.  Port-A-Cath placement. EXAM: PORTABLE CHEST 1 VIEW COMPARISON:  02/08/2013 FINDINGS: Left-sided power port is seen with tip overlying the superior cavoatrial junction. No evidence of pneumothorax or pleural effusion. Both lungs are clear. Heart size is normal. Oral contrast material is noted in the splenic flexure of colon. IMPRESSION: Left-sided power  port in appropriate position. No evidence of pneumothorax. No active disease. Electronically Signed   By: Marlaine Hind M.D.   On: 12/15/2019 09:20   DG Fluoro Guide CV Line-No Report  Result Date: 12/15/2019 Fluoroscopy was utilized by the requesting physician.  No radiographic interpretation.   ECHOCARDIOGRAM COMPLETE  Result Date: 12/16/2019    ECHOCARDIOGRAM REPORT   Patient Name:   MAYE PARKINSON Date of Exam: 12/16/2019 Medical Rec #:  [id number]      Height:       67.0 in Accession #:    [id number]     Weight:       194.0 lb Date of Birth:  03-25-1961      BSA:          1.996 m Patient Age:    9 years       BP:           166/71 mmHg Patient Gender: F              HR:           81 bpm. Exam Location:  Outpatient Procedure: 2D Echo Indications:    Chemo V67.2 / Z09  History:        Patient has no prior history of Echocardiogram examinations.                 Risk Factors:Hypertension. Breast Cancer.  Sonographer:    Darlina Sicilian RDCS Referring Phys: [id number] [place]  1. Left ventricular ejection fraction, by estimation, is 60 to 65%. The left ventricle has normal function. The left ventricle has no regional wall motion abnormalities. Left ventricular diastolic parameters are consistent with Grade I diastolic dysfunction (impaired relaxation).  2. Right ventricular systolic function is normal. The right ventricular size is normal.  3. The mitral valve is normal in structure. No evidence of mitral valve regurgitation. No evidence of mitral stenosis.  4. The aortic valve is normal in structure. Aortic valve regurgitation is not visualized. No aortic stenosis is present.  5. The inferior vena cava is normal in size with greater than 50% respiratory variability, suggesting right atrial pressure of 3 mmHg. Comparison(s): No prior Echocardiogram. Strain imaging measurements are inaccurate due to poor tissue tracking. FINDINGS  Left Ventricle: Left ventricular ejection fraction, by estimation, is 60  to 65%. The left ventricle has normal function. The left ventricle has no regional wall motion abnormalities. The left ventricular internal cavity size was normal in size. There is  no left ventricular hypertrophy. Left ventricular diastolic parameters are consistent with Grade I diastolic dysfunction (impaired relaxation). Indeterminate filling pressures. Right Ventricle: The right ventricular size is normal. No increase in right ventricular wall thickness. Right ventricular systolic function is normal. Left Atrium: Left atrial size was normal in size. Right Atrium: Right atrial size was normal in size. Pericardium: There is no evidence of pericardial effusion. Mitral Valve: The mitral valve is normal in structure. Normal mobility of the mitral valve leaflets. No evidence of mitral valve regurgitation. No evidence of mitral valve stenosis. Tricuspid Valve: The tricuspid valve is normal in structure. Tricuspid valve regurgitation is not demonstrated. No evidence of tricuspid  stenosis. Aortic Valve: The aortic valve is normal in structure. Aortic valve regurgitation is not visualized. No aortic stenosis is present. Pulmonic Valve: The pulmonic valve was normal in structure. Pulmonic valve regurgitation is not visualized. No evidence of pulmonic stenosis. Aorta: The aortic root is normal in size and structure. Venous: The inferior vena cava is normal in size with greater than 50% respiratory variability, suggesting right atrial pressure of 3 mmHg. IAS/Shunts: No atrial level shunt detected by color flow Doppler.  LEFT VENTRICLE PLAX 2D LVIDd:         4.40 cm  Diastology LVIDs:         3.10 cm  LV e' lateral:   6.53 cm/s LV PW:         0.80 cm  LV E/e' lateral: 11.5 LV IVS:        1.10 cm  LV e' medial:    6.74 cm/s LVOT diam:     2.00 cm  LV E/e' medial:  11.1 LV SV:         49 LV SV Index:   24 LVOT Area:     3.14 cm                          3D Volume EF:                         3D EF:        56 %                          LV EDV:       205 ml                         LV ESV:       91 ml                         LV SV:        114 ml RIGHT VENTRICLE RV S prime:     12.40 cm/s TAPSE (M-mode): 1.9 cm LEFT ATRIUM             Index       RIGHT ATRIUM           Index LA diam:        2.70 cm 1.35 cm/m  RA Area:     13.20 cm LA Vol (A2C):   19.5 ml 9.77 ml/m  RA Volume:   30.10 ml  15.08 ml/m LA Vol (A4C):   37.9 ml 18.99 ml/m LA Biplane Vol: 28.1 ml 14.08 ml/m  AORTIC VALVE LVOT Vmax:   97.30 cm/s LVOT Vmean:  61.200 cm/s LVOT VTI:    0.155 m  AORTA Ao Root diam: 3.50 cm Ao Asc diam:  3.90 cm MITRAL VALVE MV Area (PHT): 4.60 cm    SHUNTS MV Decel Time: 165 msec    Systemic VTI:  0.16 m MV E velocity: 75.00 cm/s  Systemic Diam: 2.00 cm MV A velocity: 93.00 cm/s MV E/A ratio:  0.81 Mihai Croitoru MD Electronically signed by Sanda Klein MD Signature Date/Time: 12/16/2019/12:48:40 PM    Final     Labs:  CBC: Recent Labs    12/03/19 1226 12/09/19 1321 01/01/20 1120  WBC 5.9 6.1 9.7  HGB 12.7 13.2 13.1  HCT 40.0 42.4 42.4  PLT 330  353 345    COAGS: Recent Labs    01/01/20 1120  INR 1.0    BMP: Recent Labs    12/03/19 1226 12/09/19 1321  NA 142 141  K 3.9 3.3*  CL 108 103  CO2 26 29  GLUCOSE 92 108*  BUN 16 17  CALCIUM 9.6 9.5  CREATININE 0.88 0.97  GFRNONAA >60 >60  GFRAA >60 >60    LIVER FUNCTION TESTS: Recent Labs    12/03/19 1226  BILITOT 0.3  AST 22  ALT 23  ALKPHOS 141*  PROT 7.6  ALBUMIN 3.9    TUMOR MARKERS: No results for input(s): AFPTM, CEA, CA199, CHROMGRNA in the last 8760 hours.  Assessment and Plan:  Breast cancer with possible metastasis: Maricela Bo, 59 year old female, presents today to the [facility] department for an image-guided liver lesion biopsy.   Risks and benefits of this procedure were discussed with the patient including, but not limited to bleeding, infection, damage to adjacent structures or low yield requiring  additional tests.  All of the questions were answered and there is agreement to proceed.  She has been NPO. Labs and vitals have been reviewed. She does not take any blood-thinning medications.   Consent signed and in chart.  Thank you for this interesting consult.  I greatly enjoyed meeting Virgina Deakins and look forward to participating in their care.  A copy of this report was sent to the requesting provider on this date.  Electronically Signed: Soyla Dryer, AGACNP-BC [phone] 01/01/2020, 12:03 PM   I spent a total of  30 Minutes   in face to face in clinical consultation, greater than 50% of which was counseling/coordinating care for liver lesion biopsy

## 2020-01-01 NOTE — Discharge Instructions (Addendum)
Liver Biopsy, Care After These instructions give you information on caring for yourself after your procedure. Your doctor may also give you more specific instructions. Call your doctor if you have any problems or questions after your procedure. What can I expect after the procedure? After the procedure, it is common to have:  Pain and soreness where the biopsy was done.  Bruising around the area where the biopsy was done.  Sleepiness and be tired for a few days. Follow these instructions at home: Medicines  Take over-the-counter and prescription medicines only as told by your doctor.  If you were prescribed an antibiotic medicine, take it as told by your doctor. Do not stop taking the antibiotic even if you start to feel better.  Do not take medicines such as aspirin and ibuprofen. These medicines can thin your blood. Do not take these medicines unless your doctor tells you to take them.  If you are taking prescription pain medicine, take actions to prevent or treat constipation. Your doctor may recommend that you: ? Drink enough fluid to keep your pee (urine) clear or pale yellow. ? Take over-the-counter or prescription medicines. ? Eat foods that are high in fiber, such as fresh fruits and vegetables, whole grains, and beans. ? Limit foods that are high in fat and processed sugars, such as fried and sweet foods. Caring for your cut  Follow instructions from your doctor about how to take care of your cuts from surgery (incisions). Make sure you: ? Wash your hands with soap and water before you change your bandage (dressing). If you cannot use soap and water, use hand sanitizer. ? Change your bandage as told by your doctor. ? Leave stitches (sutures), skin glue, or skin tape (adhesive) strips in place. They may need to stay in place for 2 weeks or longer. If tape strips get loose and curl up, you may trim the loose edges. Do not remove tape strips completely unless your doctor says it is  okay.  Check your cuts every day for signs of infection. Check for: ? Redness, swelling, or more pain. ? Fluid or blood. ? Pus or a bad smell. ? Warmth.  Do not take baths, swim, or use a hot tub until your doctor says it is okay to do so. Activity   Rest at home for 1-2 days or as told by your doctor. ? Avoid sitting for a long time without moving. Get up to take short walks every 1-2 hours.  Return to your normal activities as told by your doctor. Ask what activities are safe for you.  Do not do these things in the first 24 hours: ? Drive. ? Use machinery. ? Take a bath or shower.  Do not lift more than 10 pounds (4.5 kg) or play contact sports for the first 2 weeks. General instructions   Do not drink alcohol in the first week after the procedure.  Have someone stay with you for at least 24 hours after the procedure.  Get your test results. Ask your doctor or the department that is doing the test: ? When will my results be ready? ? How will I get my results? ? What are my treatment options? ? What other tests do I need? ? What are my next steps?  Keep all follow-up visits as told by your doctor. This is important. Contact a doctor if:  A cut bleeds and leaves more than just a small spot of blood.  A cut is red, puffs up (  swells), or hurts more than before.  Fluid or something else comes from a cut.  A cut smells bad.  You have a fever or chills. Get help right away if:  You have swelling, bloating, or pain in your belly (abdomen).  You get dizzy or faint.  You have a rash.  You feel sick to your stomach (nauseous) or throw up (vomit).  You have trouble breathing, feel short of breath, or feel faint.  Your chest hurts.  You have problems talking or seeing.  You have trouble with your balance or moving your arms or legs. Summary  After the procedure, it is common to have pain, soreness, bruising, and tiredness.  Your doctor will tell you how to  take care of yourself at home. Change your bandage, take your medicines, and limit your activities as told by your doctor.  Call your doctor if you have symptoms of infection. Get help right away if your belly swells, your cut bleeds a lot, or you have trouble talking or breathing. This information is not intended to replace advice given to you by your health care provider. Make sure you discuss any questions you have with your health care provider. Document Revised: 04/20/2017 Document Reviewed: 04/20/2017 Elsevier Patient Education  2020 [employer] Moderate Conscious Sedation, Adult Sedation is the use of medicines to promote relaxation and relieve discomfort and anxiety. Moderate conscious sedation is a type of sedation. Under moderate conscious sedation, you are less alert than normal, but you are still able to respond to instructions, touch, or both. Moderate conscious sedation is used during short medical and dental procedures. It is milder than deep sedation, which is a type of sedation under which you cannot be easily woken up. It is also milder than general anesthesia, which is the use of medicines to make you unconscious. Moderate conscious sedation allows you to return to your regular activities sooner. Tell a health care provider about:  Any allergies you have.  All medicines you are taking, including vitamins, herbs, eye drops, creams, and over-the-counter medicines.  Use of steroids (by mouth or creams).  Any problems you or family members have had with sedatives and anesthetic medicines.  Any blood disorders you have.  Any surgeries you have had.  Any medical conditions you have, such as sleep apnea.  Whether you are pregnant or may be pregnant.  Any use of cigarettes, alcohol, marijuana, or street drugs. What are the risks? Generally, this is a safe procedure. However, problems may occur, including:  Getting too much medicine (oversedation).  Nausea.  Allergic  reaction to medicines.  Trouble breathing. If this happens, a breathing tube may be used to help with breathing. It will be removed when you are awake and breathing on your own.  Heart trouble.  Lung trouble. What happens before the procedure? Staying hydrated Follow instructions from your health care provider about hydration, which may include:  Up to 2 hours before the procedure - you may continue to drink clear liquids, such as water, clear fruit juice, black coffee, and plain tea. Eating and drinking restrictions Follow instructions from your health care provider about eating and drinking, which may include:  8 hours before the procedure - stop eating heavy meals or foods such as meat, fried foods, or fatty foods.  6 hours before the procedure - stop eating light meals or foods, such as toast or cereal.  6 hours before the procedure - stop drinking milk or drinks that contain milk.  2   hours before the procedure - stop drinking clear liquids. Medicine Ask your health care provider about:  Changing or stopping your regular medicines. This is especially important if you are taking diabetes medicines or blood thinners.  Taking medicines such as aspirin and ibuprofen. These medicines can thin your blood. Do not take these medicines before your procedure if your health care provider instructs you not to.  Tests and exams  You will have a physical exam.  You may have blood tests done to show: ? How well your kidneys and liver are working. ? How well your blood can clot. General instructions  Plan to have someone take you home from the hospital or clinic.  If you will be going home right after the procedure, plan to have someone with you for 24 hours. What happens during the procedure?  An IV tube will be inserted into one of your veins.  Medicine to help you relax (sedative) will be given through the IV tube.  The medical or dental procedure will be performed. What  happens after the procedure?  Your blood pressure, heart rate, breathing rate, and blood oxygen level will be monitored often until the medicines you were given have worn off.  Do not drive for 24 hours. This information is not intended to replace advice given to you by your health care provider. Make sure you discuss any questions you have with your health care provider. Document Revised: 03/23/2017 Document Reviewed: 07/31/2015 Elsevier Patient Education  2020 [employer]  

## 2020-01-02 ENCOUNTER — Inpatient Hospital Stay: Payer: Commercial Managed Care - PPO

## 2020-01-02 ENCOUNTER — Encounter: Payer: Self-pay | Admitting: Hematology

## 2020-01-02 ENCOUNTER — Inpatient Hospital Stay: Payer: Commercial Managed Care - PPO | Attending: Hematology | Admitting: Hematology

## 2020-01-02 ENCOUNTER — Other Ambulatory Visit: Payer: Self-pay

## 2020-01-02 ENCOUNTER — Encounter: Payer: Self-pay | Admitting: *Deleted

## 2020-01-02 VITALS — BP 148/86 | HR 87 | Temp 97.8°F | Resp 18 | Ht 67.0 in | Wt 196.3 lb

## 2020-01-02 DIAGNOSIS — Z170 Estrogen receptor positive status [ER+]: Secondary | ICD-10-CM

## 2020-01-02 DIAGNOSIS — N8110 Cystocele, unspecified: Secondary | ICD-10-CM | POA: Diagnosis not present

## 2020-01-02 DIAGNOSIS — C50411 Malignant neoplasm of upper-outer quadrant of right female breast: Secondary | ICD-10-CM

## 2020-01-02 DIAGNOSIS — Z5111 Encounter for antineoplastic chemotherapy: Secondary | ICD-10-CM | POA: Diagnosis present

## 2020-01-02 DIAGNOSIS — G4700 Insomnia, unspecified: Secondary | ICD-10-CM | POA: Insufficient documentation

## 2020-01-02 DIAGNOSIS — Z5189 Encounter for other specified aftercare: Secondary | ICD-10-CM | POA: Insufficient documentation

## 2020-01-02 DIAGNOSIS — K5900 Constipation, unspecified: Secondary | ICD-10-CM | POA: Diagnosis not present

## 2020-01-02 DIAGNOSIS — D649 Anemia, unspecified: Secondary | ICD-10-CM | POA: Insufficient documentation

## 2020-01-02 DIAGNOSIS — C7951 Secondary malignant neoplasm of bone: Secondary | ICD-10-CM | POA: Diagnosis not present

## 2020-01-02 DIAGNOSIS — Z7952 Long term (current) use of systemic steroids: Secondary | ICD-10-CM | POA: Insufficient documentation

## 2020-01-02 DIAGNOSIS — E876 Hypokalemia: Secondary | ICD-10-CM | POA: Insufficient documentation

## 2020-01-02 DIAGNOSIS — I10 Essential (primary) hypertension: Secondary | ICD-10-CM | POA: Diagnosis not present

## 2020-01-02 DIAGNOSIS — Z95828 Presence of other vascular implants and grafts: Secondary | ICD-10-CM

## 2020-01-02 DIAGNOSIS — Z79899 Other long term (current) drug therapy: Secondary | ICD-10-CM | POA: Insufficient documentation

## 2020-01-02 DIAGNOSIS — R7401 Elevation of levels of liver transaminase levels: Secondary | ICD-10-CM | POA: Diagnosis not present

## 2020-01-02 DIAGNOSIS — Z5112 Encounter for antineoplastic immunotherapy: Secondary | ICD-10-CM | POA: Diagnosis present

## 2020-01-02 LAB — CBC WITH DIFFERENTIAL (CANCER CENTER ONLY)
Abs Immature Granulocytes: 0.04 10*3/uL (ref 0.00–0.07)
Basophils Absolute: 0 10*3/uL (ref 0.0–0.1)
Basophils Relative: 0 %
Eosinophils Absolute: 0 10*3/uL (ref 0.0–0.5)
Eosinophils Relative: 0 %
HCT: 30.9 % — ABNORMAL LOW (ref 36.0–46.0)
Hemoglobin: 10 g/dL — ABNORMAL LOW (ref 12.0–15.0)
Immature Granulocytes: 0 %
Lymphocytes Relative: 13 %
Lymphs Abs: 1.5 10*3/uL (ref 0.7–4.0)
MCH: 29.8 pg (ref 26.0–34.0)
MCHC: 32.4 g/dL (ref 30.0–36.0)
MCV: 92 fL (ref 80.0–100.0)
Monocytes Absolute: 0.9 10*3/uL (ref 0.1–1.0)
Monocytes Relative: 7 %
Neutro Abs: 9 10*3/uL — ABNORMAL HIGH (ref 1.7–7.7)
Neutrophils Relative %: 80 %
Platelet Count: 286 10*3/uL (ref 150–400)
RBC: 3.36 MIL/uL — ABNORMAL LOW (ref 3.87–5.11)
RDW: 14.6 % (ref 11.5–15.5)
WBC Count: 11.5 10*3/uL — ABNORMAL HIGH (ref 4.0–10.5)
nRBC: 0 % (ref 0.0–0.2)

## 2020-01-02 LAB — CMP (CANCER CENTER ONLY)
ALT: 135 U/L — ABNORMAL HIGH (ref 0–44)
AST: 112 U/L — ABNORMAL HIGH (ref 15–41)
Albumin: 3.5 g/dL (ref 3.5–5.0)
Alkaline Phosphatase: 202 U/L — ABNORMAL HIGH (ref 38–126)
Anion gap: 8 (ref 5–15)
BUN: 26 mg/dL — ABNORMAL HIGH (ref 6–20)
CO2: 26 mmol/L (ref 22–32)
Calcium: 9.5 mg/dL (ref 8.9–10.3)
Chloride: 102 mmol/L (ref 98–111)
Creatinine: 1.05 mg/dL — ABNORMAL HIGH (ref 0.44–1.00)
GFR, Est AFR Am: >60 mL/min (ref >60)
GFR, Estimated: 58 mL/min — ABNORMAL LOW (ref >60)
Glucose, Bld: 103 mg/dL — ABNORMAL HIGH (ref 70–99)
Potassium: 3.7 mmol/L (ref 3.5–5.1)
Sodium: 136 mmol/L (ref 135–145)
Total Bilirubin: 0.7 mg/dL (ref 0.3–1.2)
Total Protein: 7.1 g/dL (ref 6.5–8.1)

## 2020-01-02 MED ORDER — SODIUM CHLORIDE 0.9% FLUSH
10.0000 mL | INTRAVENOUS | Status: DC | PRN
  Administered 2020-01-02: 10 mL via INTRAVENOUS
  Filled 2020-01-02: qty 10

## 2020-01-02 NOTE — Patient Instructions (Signed)

## 2020-01-03 ENCOUNTER — Inpatient Hospital Stay: Payer: Commercial Managed Care - PPO

## 2020-01-03 ENCOUNTER — Other Ambulatory Visit: Payer: Self-pay

## 2020-01-03 VITALS — BP 128/64 | HR 99 | Temp 98.4°F | Resp 16

## 2020-01-03 DIAGNOSIS — Z5112 Encounter for antineoplastic immunotherapy: Secondary | ICD-10-CM | POA: Diagnosis not present

## 2020-01-03 DIAGNOSIS — C50411 Malignant neoplasm of upper-outer quadrant of right female breast: Secondary | ICD-10-CM

## 2020-01-03 LAB — CANCER ANTIGEN 27.29: CA 27.29: 46.2 U/mL — ABNORMAL HIGH (ref 0.0–38.6)

## 2020-01-03 MED ORDER — SODIUM CHLORIDE 0.9 % IV SOLN
840.0000 mg | Freq: Once | INTRAVENOUS | Status: AC
  Administered 2020-01-03: 840 mg via INTRAVENOUS
  Filled 2020-01-03: qty 28

## 2020-01-03 MED ORDER — ACETAMINOPHEN 325 MG PO TABS
ORAL_TABLET | ORAL | Status: AC
  Filled 2020-01-03: qty 2

## 2020-01-03 MED ORDER — PALONOSETRON HCL INJECTION 0.25 MG/5ML
INTRAVENOUS | Status: AC
  Filled 2020-01-03: qty 5

## 2020-01-03 MED ORDER — DIPHENHYDRAMINE HCL 25 MG PO CAPS
50.0000 mg | ORAL_CAPSULE | Freq: Once | ORAL | Status: AC
  Administered 2020-01-03: 50 mg via ORAL

## 2020-01-03 MED ORDER — SODIUM CHLORIDE 0.9 % IV SOLN
10.0000 mg | Freq: Once | INTRAVENOUS | Status: AC
  Administered 2020-01-03: 10 mg via INTRAVENOUS
  Filled 2020-01-03: qty 10

## 2020-01-03 MED ORDER — DIPHENHYDRAMINE HCL 25 MG PO CAPS
ORAL_CAPSULE | ORAL | Status: AC
  Filled 2020-01-03: qty 2

## 2020-01-03 MED ORDER — PALONOSETRON HCL INJECTION 0.25 MG/5ML
0.2500 mg | Freq: Once | INTRAVENOUS | Status: AC
  Administered 2020-01-03: 0.25 mg via INTRAVENOUS

## 2020-01-03 MED ORDER — HEPARIN SOD (PORK) LOCK FLUSH 100 UNIT/ML IV SOLN
500.0000 [IU] | Freq: Once | INTRAVENOUS | Status: AC | PRN
  Administered 2020-01-03: 500 [IU]
  Filled 2020-01-03: qty 5

## 2020-01-03 MED ORDER — SODIUM CHLORIDE 0.9 % IV SOLN
Freq: Once | INTRAVENOUS | Status: AC
  Filled 2020-01-03: qty 250

## 2020-01-03 MED ORDER — ACETAMINOPHEN 325 MG PO TABS
650.0000 mg | ORAL_TABLET | Freq: Once | ORAL | Status: AC
  Administered 2020-01-03: 650 mg via ORAL

## 2020-01-03 MED ORDER — SODIUM CHLORIDE 0.9 % IV SOLN
75.0000 mg/m2 | Freq: Once | INTRAVENOUS | Status: AC
  Administered 2020-01-03: 140 mg via INTRAVENOUS
  Filled 2020-01-03: qty 14

## 2020-01-03 MED ORDER — TRASTUZUMAB-ANNS CHEMO 150 MG IV SOLR
8.0000 mg/kg | Freq: Once | INTRAVENOUS | Status: AC
  Administered 2020-01-03: 714 mg via INTRAVENOUS
  Filled 2020-01-03: qty 34

## 2020-01-03 MED ORDER — SODIUM CHLORIDE 0.9% FLUSH
10.0000 mL | INTRAVENOUS | Status: DC | PRN
  Administered 2020-01-03: 10 mL
  Filled 2020-01-03: qty 10

## 2020-01-03 NOTE — Addendum Note (Signed)
Addended by: Truitt Merle on: 01/03/2020 07:38 PM   Modules accepted: Orders

## 2020-01-03 NOTE — Patient Instructions (Signed)
[facility] Discharge Instructions for Patients Receiving Chemotherapy  Today you received the following chemotherapy agents: trastuzumab, pertuzumab, and docetaxel.  To help prevent nausea and vomiting after your treatment, we encourage you to take your nausea medication as directed.   If you develop nausea and vomiting that is not controlled by your nausea medication, call the clinic.   BELOW ARE SYMPTOMS THAT SHOULD BE REPORTED IMMEDIATELY:  *FEVER GREATER THAN 100.5 F  *CHILLS WITH OR WITHOUT FEVER  NAUSEA AND VOMITING THAT IS NOT CONTROLLED WITH YOUR NAUSEA MEDICATION  *UNUSUAL SHORTNESS OF BREATH  *UNUSUAL BRUISING OR BLEEDING  TENDERNESS IN MOUTH AND THROAT WITH OR WITHOUT PRESENCE OF ULCERS  *URINARY PROBLEMS  *BOWEL PROBLEMS  UNUSUAL RASH Items with * indicate a potential emergency and should be followed up as soon as possible.  Feel free to call the clinic should you have any questions or concerns. The clinic phone number is [phone].  Please show the [facility] at check-in to the Emergency Department and triage nurse.  Trastuzumab injection for infusion What is this medicine? TRASTUZUMAB (tras TOO zoo mab) is a monoclonal antibody. It is used to treat breast cancer and stomach cancer. This medicine may be used for other purposes; ask your health care provider or pharmacist if you have questions. COMMON BRAND NAME(S): Herceptin, Galvin Proffer, Trazimera What should I tell my health care provider before I take this medicine? They need to know if you have any of these conditions:  heart disease  heart failure  lung or breathing disease, like asthma  an unusual or allergic reaction to trastuzumab, benzyl alcohol, or other medications, foods, dyes, or preservatives  pregnant or trying to get pregnant  breast-feeding How should I use this medicine? This drug is given as an infusion into a vein. It is  administered in a hospital or clinic by a specially trained health care professional. Talk to your pediatrician regarding the use of this medicine in children. This medicine is not approved for use in children. Overdosage: If you think you have taken too much of this medicine contact a poison control center or emergency room at once. NOTE: This medicine is only for you. Do not share this medicine with others. What if I miss a dose? It is important not to miss a dose. Call your doctor or health care professional if you are unable to keep an appointment. What may interact with this medicine? This medicine may interact with the following medications:  certain types of chemotherapy, such as daunorubicin, doxorubicin, epirubicin, and idarubicin This list may not describe all possible interactions. Give your health care provider a list of all the medicines, herbs, non-prescription drugs, or dietary supplements you use. Also tell them if you smoke, drink alcohol, or use illegal drugs. Some items may interact with your medicine. What should I watch for while using this medicine? Visit your doctor for checks on your progress. Report any side effects. Continue your course of treatment even though you feel ill unless your doctor tells you to stop. Call your doctor or health care professional for advice if you get a fever, chills or sore throat, or other symptoms of a cold or flu. Do not treat yourself. Try to avoid being around people who are sick. You may experience fever, chills and shaking during your first infusion. These effects are usually mild and can be treated with other medicines. Report any side effects during the infusion to your health care professional.  Fever and chills usually do not happen with later infusions. Do not become pregnant while taking this medicine or for 7 months after stopping it. Women should inform their doctor if they wish to become pregnant or think they might be pregnant.  Women of child-bearing potential will need to have a negative pregnancy test before starting this medicine. There is a potential for serious side effects to an unborn child. Talk to your health care professional or pharmacist for more information. Do not breast-feed an infant while taking this medicine or for 7 months after stopping it. Women must use effective birth control with this medicine. What side effects may I notice from receiving this medicine? Side effects that you should report to your doctor or health care professional as soon as possible:  allergic reactions like skin rash, itching or hives, swelling of the face, lips, or tongue  chest pain or palpitations  cough  dizziness  feeling faint or lightheaded, falls  fever  general ill feeling or flu-like symptoms  signs of worsening heart failure like breathing problems; swelling in your legs and feet  unusually weak or tired Side effects that usually do not require medical attention (report to your doctor or health care professional if they continue or are bothersome):  bone pain  changes in taste  diarrhea  joint pain  nausea/vomiting  weight loss This list may not describe all possible side effects. Call your doctor for medical advice about side effects. You may report side effects to FDA at 1-800-FDA-1088. Where should I keep my medicine? This drug is given in a hospital or clinic and will not be stored at home. NOTE: This sheet is a summary. It may not cover all possible information. If you have questions about this medicine, talk to your doctor, pharmacist, or health care provider.  2020 Elsevier/Gold Standard (2016-04-04 14:37:52)  Pertuzumab injection What is this medicine? PERTUZUMAB (per TOOZ ue mab) is a monoclonal antibody. It is used to treat breast cancer. This medicine may be used for other purposes; ask your health care provider or pharmacist if you have questions. COMMON BRAND NAME(S):  PERJETA What should I tell my health care provider before I take this medicine? They need to know if you have any of these conditions:  heart disease  heart failure  high blood pressure  history of irregular heart beat  recent or ongoing radiation therapy  an unusual or allergic reaction to pertuzumab, other medicines, foods, dyes, or preservatives  pregnant or trying to get pregnant  breast-feeding How should I use this medicine? This medicine is for infusion into a vein. It is given by a health care professional in a hospital or clinic setting. Talk to your pediatrician regarding the use of this medicine in children. Special care may be needed. Overdosage: If you think you have taken too much of this medicine contact a poison control center or emergency room at once. NOTE: This medicine is only for you. Do not share this medicine with others. What if I miss a dose? It is important not to miss your dose. Call your doctor or health care professional if you are unable to keep an appointment. What may interact with this medicine? Interactions are not expected. Give your health care provider a list of all the medicines, herbs, non-prescription drugs, or dietary supplements you use. Also tell them if you smoke, drink alcohol, or use illegal drugs. Some items may interact with your medicine. This list may not describe all possible interactions.   Give your health care provider a list of all the medicines, herbs, non-prescription drugs, or dietary supplements you use. Also tell them if you smoke, drink alcohol, or use illegal drugs. Some items may interact with your medicine. What should I watch for while using this medicine? Your condition will be monitored carefully while you are receiving this medicine. Report any side effects. Continue your course of treatment even though you feel ill unless your doctor tells you to stop. Do not become pregnant while taking this medicine or for 7 months after stopping  it. Women should inform their doctor if they wish to become pregnant or think they might be pregnant. Women of child-bearing potential will need to have a negative pregnancy test before starting this medicine. There is a potential for serious side effects to an unborn child. Talk to your health care professional or pharmacist for more information. Do not breast-feed an infant while taking this medicine or for 7 months after stopping it. Women must use effective birth control with this medicine. Call your doctor or health care professional for advice if you get a fever, chills or sore throat, or other symptoms of a cold or flu. Do not treat yourself. Try to avoid being around people who are sick. You may experience fever, chills, and headache during the infusion. Report any side effects during the infusion to your health care professional. What side effects may I notice from receiving this medicine? Side effects that you should report to your doctor or health care professional as soon as possible:  breathing problems  chest pain or palpitations  dizziness  feeling faint or lightheaded  fever or chills  skin rash, itching or hives  sore throat  swelling of the face, lips, or tongue  swelling of the legs or ankles  unusually weak or tired Side effects that usually do not require medical attention (report to your doctor or health care professional if they continue or are bothersome):  diarrhea  hair loss  nausea, vomiting  tiredness This list may not describe all possible side effects. Call your doctor for medical advice about side effects. You may report side effects to FDA at 1-800-FDA-1088. Where should I keep my medicine? This drug is given in a hospital or clinic and will not be stored at home. NOTE: This sheet is a summary. It may not cover all possible information. If you have questions about this medicine, talk to your doctor, pharmacist, or health care provider.  2020  Elsevier/Gold Standard (2015-05-13 12:08:50)  Docetaxel injection What is this medicine? DOCETAXEL (doe se TAX el) is a chemotherapy drug. It targets fast dividing cells, like cancer cells, and causes these cells to die. This medicine is used to treat many types of cancers like breast cancer, certain stomach cancers, head and neck cancer, lung cancer, and prostate cancer. This medicine may be used for other purposes; ask your health care provider or pharmacist if you have questions. COMMON BRAND NAME(S): Docefrez, Taxotere What should I tell my health care provider before I take this medicine? They need to know if you have any of these conditions:  infection (especially a virus infection such as chickenpox, cold sores, or herpes)  liver disease  low blood counts, like low white cell, platelet, or red cell counts  an unusual or allergic reaction to docetaxel, polysorbate 80, other chemotherapy agents, other medicines, foods, dyes, or preservatives  pregnant or trying to get pregnant  breast-feeding How should I use this medicine? This drug is  given as an infusion into a vein. It is administered in a hospital or clinic by a specially trained health care professional. Talk to your pediatrician regarding the use of this medicine in children. Special care may be needed. Overdosage: If you think you have taken too much of this medicine contact a poison control center or emergency room at once. NOTE: This medicine is only for you. Do not share this medicine with others. What if I miss a dose? It is important not to miss your dose. Call your doctor or health care professional if you are unable to keep an appointment. What may interact with this medicine?  aprepitant  certain antibiotics like erythromycin or clarithromycin  certain antivirals for HIV or hepatitis  certain medicines for fungal infections like fluconazole, itraconazole, ketoconazole, posaconazole, or  voriconazole  cimetidine  ciprofloxacin  conivaptan  cyclosporine  dronedarone  fluvoxamine  grapefruit juice  imatinib  verapamil This list may not describe all possible interactions. Give your health care provider a list of all the medicines, herbs, non-prescription drugs, or dietary supplements you use. Also tell them if you smoke, drink alcohol, or use illegal drugs. Some items may interact with your medicine. What should I watch for while using this medicine? Your condition will be monitored carefully while you are receiving this medicine. You will need important blood work done while you are taking this medicine. Call your doctor or health care professional for advice if you get a fever, chills or sore throat, or other symptoms of a cold or flu. Do not treat yourself. This drug decreases your body's ability to fight infections. Try to avoid being around people who are sick. Some products may contain alcohol. Ask your health care professional if this medicine contains alcohol. Be sure to tell all health care professionals you are taking this medicine. Certain medicines, like metronidazole and disulfiram, can cause an unpleasant reaction when taken with alcohol. The reaction includes flushing, headache, nausea, vomiting, sweating, and increased thirst. The reaction can last from 30 minutes to several hours. You may get drowsy or dizzy. Do not drive, use machinery, or do anything that needs mental alertness until you know how this medicine affects you. Do not stand or sit up quickly, especially if you are an older patient. This reduces the risk of dizzy or fainting spells. Alcohol may interfere with the effect of this medicine. Talk to your health care professional about your risk of cancer. You may be more at risk for certain types of cancer if you take this medicine. Do not become pregnant while taking this medicine or for 6 months after stopping it. Women should inform their doctor if  they wish to become pregnant or think they might be pregnant. There is a potential for serious side effects to an unborn child. Talk to your health care professional or pharmacist for more information. Do not breast-feed an infant while taking this medicine or for 1 week after stopping it. Males who get this medicine must use a condom during sex with females who can get pregnant. If you get a woman pregnant, the baby could have birth defects. The baby could die before they are born. You will need to continue wearing a condom for 3 months after stopping the medicine. Tell your health care provider right away if your partner becomes pregnant while you are taking this medicine. This may interfere with the ability to father a child. You should talk to your doctor or health care professional if you   are concerned about your fertility. What side effects may I notice from receiving this medicine? Side effects that you should report to your doctor or health care professional as soon as possible:  allergic reactions like skin rash, itching or hives, swelling of the face, lips, or tongue  blurred vision  breathing problems  changes in vision  low blood counts - This drug may decrease the number of white blood cells, red blood cells and platelets. You may be at increased risk for infections and bleeding.  nausea and vomiting  pain, redness or irritation at site where injected  pain, tingling, numbness in the hands or feet  redness, blistering, peeling, or loosening of the skin, including inside the mouth  signs of decreased platelets or bleeding - bruising, pinpoint red spots on the skin, black, tarry stools, nosebleeds  signs of decreased red blood cells - unusually weak or tired, fainting spells, lightheadedness  signs of infection - fever or chills, cough, sore throat, pain or difficulty passing urine  swelling of the ankle, feet, hands Side effects that usually do not require medical attention  (report to your doctor or health care professional if they continue or are bothersome):  constipation  diarrhea  fingernail or toenail changes  hair loss  loss of appetite  mouth sores  muscle pain This list may not describe all possible side effects. Call your doctor for medical advice about side effects. You may report side effects to FDA at 1-800-FDA-1088. Where should I keep my medicine? This drug is given in a hospital or clinic and will not be stored at home. NOTE: This sheet is a summary. It may not cover all possible information. If you have questions about this medicine, talk to your doctor, pharmacist, or health care provider.  2020 Elsevier/Gold Standard (2018-12-05 10:19:06)

## 2020-01-05 ENCOUNTER — Ambulatory Visit (HOSPITAL_COMMUNITY)
Admission: RE | Admit: 2020-01-05 | Discharge: 2020-01-05 | Disposition: A | Discharge status: Home / Self Care | Payer: Commercial Managed Care - PPO | Source: Ambulatory Visit | Attending: Hematology | Admitting: Hematology

## 2020-01-05 ENCOUNTER — Other Ambulatory Visit: Payer: Self-pay

## 2020-01-05 ENCOUNTER — Encounter: Payer: Self-pay | Admitting: *Deleted

## 2020-01-05 ENCOUNTER — Telehealth: Payer: Self-pay | Admitting: *Deleted

## 2020-01-05 ENCOUNTER — Inpatient Hospital Stay: Payer: Commercial Managed Care - PPO

## 2020-01-05 ENCOUNTER — Telehealth: Payer: Self-pay

## 2020-01-05 VITALS — BP 180/91 | HR 78 | Temp 99.2°F | Resp 18

## 2020-01-05 DIAGNOSIS — Z170 Estrogen receptor positive status [ER+]: Secondary | ICD-10-CM | POA: Diagnosis present

## 2020-01-05 DIAGNOSIS — Z5112 Encounter for antineoplastic immunotherapy: Secondary | ICD-10-CM | POA: Diagnosis not present

## 2020-01-05 DIAGNOSIS — C50411 Malignant neoplasm of upper-outer quadrant of right female breast: Secondary | ICD-10-CM

## 2020-01-05 LAB — SURGICAL PATHOLOGY

## 2020-01-05 MED ORDER — PEGFILGRASTIM-BMEZ 6 MG/0.6ML ~~LOC~~ SOSY
6.0000 mg | PREFILLED_SYRINGE | Freq: Once | SUBCUTANEOUS | Status: AC
  Administered 2020-01-05: 6 mg via SUBCUTANEOUS

## 2020-01-05 NOTE — Patient Instructions (Signed)

## 2020-01-05 NOTE — Telephone Encounter (Signed)
I spoke with Carolyn Stewart and let her know that per Dr. Burr Medico the abd u/s shows a hematoma at the site of her liver biopsy.  She verbalized understanding.

## 2020-01-06 ENCOUNTER — Encounter (HOSPITAL_COMMUNITY): Payer: Self-pay | Admitting: Radiology

## 2020-01-06 ENCOUNTER — Ambulatory Visit: Payer: Commercial Managed Care - PPO

## 2020-01-06 NOTE — Progress Notes (Signed)
Carolyn Bo "Kim" Female, 59 y.o., 27-Jul-1960 MRN:  [id number] Phone:  [phone] [name]) PCP:  Julian Hy, PA-C Coverage:  [facility] Ppo Next Appt With Oncology 01/09/2020 at 2:15 PM  RE: CT Biopsy Received: Duaine Dredge, MD  Jillyn [place]   CT core pelvic bone lesion  Either L iliac wing (see 12/24/19 MR 41:13) or R supraacetab 41:22   DD[facility]       Previous Messages   ----- Message -----  From: Garth Bigness D  Sent: 01/05/2020  3:06 PM EDT  To: [facility] Procedure Requests  Subject: CT Biopsy                     Procedure: CT Biopsy   Reason: Malignant neoplasm of upper-outer quadrant of right breast in female, estrogen receptor positive, Confirm bone metastasis   History: US Biopsy done 9/9, MR, [place] in computer   Provider: Truitt Merle   Provider Contact: [phone]

## 2020-01-07 ENCOUNTER — Ambulatory Visit: Payer: Commercial Managed Care - PPO | Admitting: Hematology

## 2020-01-07 ENCOUNTER — Other Ambulatory Visit: Payer: Commercial Managed Care - PPO

## 2020-01-07 ENCOUNTER — Ambulatory Visit: Payer: Commercial Managed Care - PPO

## 2020-01-07 NOTE — Progress Notes (Signed)
[place] OFFICE PROGRESS NOTE  Julian Hy, PA-C [place]  [place] [address]  DIAGNOSIS: Breast Cancer  Oncology History Overview Note  Cancer Staging Malignant neoplasm of upper-outer quadrant of right breast in female, estrogen receptor positive ([place]) Staging form: Breast, AJCC 8th Edition - Clinical stage from 11/25/2019: Stage IIA (cT3, cN1, cM0, G2, ER+, PR+, HER2+) - Signed by Truitt Merle, MD on 12/03/2019    Malignant neoplasm of upper-outer quadrant of right breast in female, estrogen receptor positive ([place])  11/13/2019 Mammogram    Diagnostic Mammogram 11/13/19  IMPRESSION: -Highly suspicious mass and calcifications in the right breast centered between 9 and 12 o'clock spanning up to 11 cm.  -Multiple masses in the right axilla. One of the masses contains calcifications, denoted as mass number 2 measuring 9 x 11 by 10 mm. Several of these masses do not appear to represent definitive lymph nodes. Others may be small but abnormal appearing lymph nodes. Taken in total, a cluster of masses in the right axilla spans up to 3.7 cm.  -There are fibrocystic changes on the left. There are also some solid-appearing masses. A solid appearing mass at 10:30, 6 cm from the nipple measures 9 x 6 x 6 mm. An adjacent solid mass at 10 o'clock, 6 cm from the nipple measures 8 x 6 by 5 mm. Another solid-appearing mass at 11 o'clock, 1 cm from the nipple measures 5 x 4 by 5 mm. Fibrocystic changes are seen at 10 o'clock, 12 o'clock, and 4 o'clock. Another solid-appearing mass seen at 5 o'clock, 4 cm from the nipple measuring 6 x 3 x 4 mm. There is a single abnormal node in the left axilla with a cortex measuring 5 mm and restriction of the fatty hilum.   11/25/2019 Initial Biopsy   Diagnosis 1. Breast, right, needle core biopsy, upper outer - INVASIVE MAMMARY CARCINOMA - MAMMARY CARCINOMA IN SITU WITH CALCIFICATIONS AND NECROSIS - SEE COMMENT 2. Lymph node,  needle/core biopsy, right axilla - INVASIVE MAMMARY CARCINOMA - SEE COMMENT 3. Lymph node, needle/core biopsy, left axilla - BENIGN LYMPH NODE - NO CARCINOMA IDENTIFIED Microscopic Comment 1. The biopsy material shows an infiltrative proliferation of cells arranged linearly and in small clusters. Based on the biopsy, the carcinoma appears Nottingham grade 2 of 3 and measures 1.2 cm in greatest linear extent. E-cadherin and prognostic markers (ER/PR/ki-67/HER2)are pending and will be reported in an addendum. Dr. Tresa Moore reviewed the case and agrees with the above diagnosis. These results were called to [facility] on November 26, 2019. 2. Definitive nodal tissue is not identified. This biopsy has a slightly different architecture morphology than what is seen in part 1. The carcinoma measures 0.7 cm in greatest dimension. E-cadherin and prognostic markers (ER, PR, Ki-67 and HER-2) are pending and will be reported in an addendum.   11/25/2019 Receptors her2   1. PROGNOSTIC INDICATORS Results: IMMUNOHISTOCHEMICAL AND MORPHOMETRIC ANALYSIS PERFORMED MANUALLY The tumor cells are POSITIVE for Her2 (3+). Estrogen Receptor: 90%, POSITIVE, STRONG STAINING INTENSITY Progesterone Receptor: 20%, POSITIVE, STRONG STAINING INTENSITY Proliferation Marker Ki67: 30%    2. PROGNOSTIC INDICATORS Results: IMMUNOHISTOCHEMICAL AND MORPHOMETRIC ANALYSIS PERFORMED MANUALLY The tumor cells are POSITIVE for Her2 (3+). Estrogen Receptor: 90%, POSITIVE, STRONG STAINING INTENSITY Progesterone Receptor: 25%, POSITIVE, STRONG STAINING INTENSITY Proliferation Marker Ki67: 30%      11/25/2019 Cancer Staging   Staging form: Breast, AJCC 8th Edition - Clinical stage from 11/25/2019: Stage IIA (cT3, cN1, cM0, G2, ER+,  PR+, HER2+) - Signed by Truitt Merle, MD on 12/03/2019   12/01/2019 Initial Diagnosis   Malignant neoplasm of upper-outer quadrant of right breast in female, estrogen receptor positive ([place])     Neo-Adjuvant Chemotherapy   PENDING neo-adjuvant TCHP (chemo Docetaxel and Carboplatin with Antibody Herceptin and Perjeta) q3weeks for 6 cycles followed by maintenance Herceptin and Perjeta to complete 1 year treatment.    12/10/2019 Imaging   Bone Scan  IMPRESSION: Findings concerning for bony metastatic disease in the left acetabulum region, L3 vertebral body region, and anterolateral right tenth rib.   Increased uptake in several large joints likely is of arthropathic etiology.   12/11/2019 Imaging   CT CAP w contrast  IMPRESSION: Prominent glandular tissue in the central right breast with nipple retraction, likely corresponding to the patient's known right breast neoplasm.   Prominent right axillary nodes measuring up to 8 mm short axis, suspicious for nodal metastases in this clinical context.   3 mm subpleural pulmonary nodule in the right lower lobe, likely benign. However, follow-up CT chest is suggested in 3-6 months.   Multiple hepatic lesions, including a dominant 2.4 cm lesion in segment 6, which is considered indeterminate. Metastasis is not excluded. Consider MRI abdomen with/without contrast for further characterization.   11 mm sclerotic lesion along the left posterior aspect of the T7 vertebral body raises concern for isolated osseous metastasis. Correlate with priors if available. Benign vertebral hemangioma at T11.   12/12/2019 Breast MRI   IMPRESSION: 1. The biopsy proven malignancy in the right breast involves all 4 quadrants and spans up to 9 cm by MRI. There is enhancement within the skin at the level of the nipple.   2. Multiple matted masses are seen in the right axilla, either abnormal metastatic lymph nodes or a combination of masses and metastatic lymph nodes. One of these has been previously biopsied showing invasive mammary carcinoma without definitive nodal tissue.   3.  There is a 1.5 cm Rotter's lymph node on the right.   4. There is  markedly abnormal enhancement and edema in the right pectoralis major muscle spanning 6-7 cm, suspicious for metastatic disease.   5. There is diffuse nodular enhancement throughout the left breast, which may correspond with the solid-appearing masses seen on ultrasound. These all have a similar appearance on MRI.   6.  No findings of left axillary lymphadenopathy.   12/15/2019 Procedure   PAC placed    12/17/2019 -  Chemotherapy   neo-adjuvant TCHP (chemo Docetaxel and Carboplatin with Antibody Herceptin and Perjeta) q3weeks starting 12/17/19 for 6 cycles followed by maintenance Herceptin and Perjeta to complete 1 year treatment.   12/24/2019 Imaging   MRI abdomen  IMPRESSION: 1. Multifocal enhancing bone lesions are noted within the lumbar spine, and bony pelvis compatible with metastatic disease. 2. Four, small peripherally enhancing cystic lesions are noted within the liver worrisome for metastatic disease. 3. 2 enhancing liver lesions are also noted with signal and enhancement characteristics consistent with benign liver hemangioma. 4. Well-circumscribed, enhancing T2 and T1 hypointense structure within the right adnexal region is indeterminate. It is unclear whether not this is arising from the right ovary, broad ligament or right side of uterine fundus. Differential considerations include fibrothecoma, versus pedunculated uterine leiomyoma. Metastatic disease is considered less favored. Attention on follow-up imaging is recommended.     12/24/2019 Imaging   MRI pelvis  IMPRESSION: 1. Multifocal enhancing bone lesions are noted within the lumbar spine, and bony pelvis compatible with metastatic  disease. 2. Four, small peripherally enhancing cystic lesions are noted within the liver worrisome for metastatic disease. 3. 2 enhancing liver lesions are also noted with signal and enhancement characteristics consistent with benign liver hemangioma. 4. Well-circumscribed, enhancing  T2 and T1 hypointense structure within the right adnexal region is indeterminate. It is unclear whether not this is arising from the right ovary, broad ligament or right side of uterine fundus. Differential considerations include fibrothecoma, versus pedunculated uterine leiomyoma. Metastatic disease is considered less favored. Attention on follow-up imaging is recommended.     CURRENT THERAPY: Taxotere, Perjeta, and Herceptin IV every 3 weeks. First dose 01/02/20  INTERVAL HISTORY: Klover Priestly 59 y.o. female returns to the clinic today for a follow-up visit.  The patient is feeling fairly well today; however, she was in the ER yesterday.  She presented to the emergency room for the chief complaint of diarrhea.  The diarrhea started yesterday after the patient had taken 2 bottles of MiraLAX for constipation.  She reports that she had approximately 6-8 episodes of diarrhea yesterday.  She denies any blood in the stool, fevers, or abdominal pain. Additionally, the patient had vaginal prolapse.  In the emergency room, they were able to use a speculum to resolve the prolapse.  However, she will follow with her OB/GYN regarding this concern.  Today, the patient states that her diarrhea has stopped and she feels well.  Her other main concern is related to insomnia.  She has not tried taking anything yet for insomnia and is open to recommendations.  The patient was recently diagnosed with breast cancer.  Her work-up has been suspicious for metastatic disease, however her liver biopsy was negative for malignant cells.  She is scheduled to have a biopsy of one of the suspicious bone lesions performed on Monday, 01/12/2020.  In the meantime, the patient started her treatment with docetaxel, Perjeta, and Herceptin.  Dr. Burr Medico held her carboplatin due to it being suspicious for metastatic disease.  The patient tolerated her first cycle of treatment well without any concerning adverse effects except for  constipation which may have been from pain medication use. Denies any fever or chills.  She reports baseline night sweats.  She lost about 6 pounds since her last appointment although she mentions that she has a good appetite and is eating well.  She denies any nausea or vomiting.  The patient had significant abdominal pain after her liver biopsy in which further work-up showed bleeding.  Her abdominal pain is improved at this time.  Her hemoglobin is stable to improved.  Denies any signs and symptoms of infection.  Denies any chest pain, cough, or shortness of breath.  The patient denies any peripheral neuropathy.  The patient is here today for evaluation in 1 week follow-up visit to see how she tolerated her first cycle of treatment.  MEDICAL HISTORY: Past Medical History:  Diagnosis Date  . Cancer ([place]) 2021  . Hypertension     ALLERGIES:  has No Known Allergies.  MEDICATIONS:  Current Outpatient Medications  Medication Sig Dispense Refill  . acetaminophen (TYLENOL) 500 MG tablet Take 1,000 mg by mouth every 6 (six) hours as needed for moderate pain or headache.    Marland Kitchen amLODipine (NORVASC) 10 MG tablet Take 10 mg by mouth daily.    . Cholecalciferol (VITAMIN D3) 50 MCG (2000 UT) TABS Take 2,000 Units by mouth daily.     Marland Kitchen dexamethasone (DECADRON) 4 MG tablet Take 2 tablets (8 mg total) by mouth 2 (  two) times daily. Start the day before Taxotere. Then take daily x 3 days after chemotherapy. 30 tablet 1  . lidocaine-prilocaine (EMLA) cream Apply to affected area once 30 g 3  . metoprolol succinate (TOPROL-XL) 50 MG 24 hr tablet Take 50 mg by mouth daily.    Marland Kitchen omeprazole (PRILOSEC OTC) 20 MG tablet Take 20 mg by mouth daily.    . ondansetron (ZOFRAN) 8 MG tablet Take 1 tablet (8 mg total) by mouth 2 (two) times daily as needed (Nausea or vomiting). Start on the third day after chemotherapy. 30 tablet 1  . oxyCODONE (OXY IR/ROXICODONE) 5 MG immediate release tablet Take 1 tablet (5 mg total) by  mouth every 6 (six) hours as needed for severe pain. (Patient not taking: Reported on 12/30/2019) 10 tablet 0  . potassium chloride SA (KLOR-CON) 20 MEQ tablet Take 1 tablet (20 mEq total) by mouth daily. 6 tablet 0  . prochlorperazine (COMPAZINE) 10 MG tablet Take 1 tablet (10 mg total) by mouth every 6 (six) hours as needed (Nausea or vomiting). 30 tablet 1   No current facility-administered medications for this visit.    SURGICAL HISTORY:  Past Surgical History:  Procedure Laterality Date  . left parotid tumor removal   2006  . PORTACATH PLACEMENT Left 12/15/2019   Procedure: INSERTION PORT-A-CATH WITH ULTRASOUND GUIDANCE;  Surgeon: [name], MD;  Location: [facility];  Service: General;  Laterality: Left;  . TONSILLECTOMY      REVIEW OF SYSTEMS:   Review of Systems  Constitutional: Positive for weight loss.  Negative for appetite change, chills, fatigue, and fever.  HENT: Negative for mouth sores, nosebleeds, sore throat and trouble swallowing.   Eyes: Negative for eye problems and icterus.  Respiratory: Negative for cough, hemoptysis, shortness of breath and wheezing.   Cardiovascular: Negative for chest pain and leg swelling.  Gastrointestinal: Positive for diarrhea (resolved). Negative for abdominal pain, constipation, nausea and vomiting.  Genitourinary: Positive for vaginal prolapse. Negative for bladder incontinence, difficulty urinating, dysuria, frequency and hematuria.   Musculoskeletal: Negative for back pain, gait problem, neck pain and neck stiffness.  Skin: Negative for itching and rash.  Neurological: Negative for dizziness, extremity weakness, gait problem, headaches, light-headedness and seizures.  Hematological: Negative for adenopathy. Does not bruise/bleed easily.  Psychiatric/Behavioral: Negative for confusion, depression and sleep disturbance. The patient is not nervous/anxious.     PHYSICAL EXAMINATION:  Blood pressure 117/71, pulse (!) 101, temperature 99.1 F  (37.3 C), temperature source Tympanic, resp. rate 18, height _0  (1.702 m), weight 190 lb 4.8 oz (86.3 kg), last menstrual period 01/19/2013, SpO2 97 %.  ECOG PERFORMANCE STATUS: 1 - Symptomatic but completely ambulatory  Physical Exam  Constitutional: Oriented to person, place, and time and well-developed, well-nourished, and in no distress.  HENT:  Head: Normocephalic and atraumatic.  Mouth/Throat: Oropharynx is clear and moist. No oropharyngeal exudate.  Eyes: Conjunctivae are normal. Right eye exhibits no discharge. Left eye exhibits no discharge. No scleral icterus.  Neck: Normal range of motion. Neck supple.  Cardiovascular: Normal rate, regular rhythm, normal heart sounds and intact distal pulses.   Pulmonary/Chest: Effort normal and breath sounds normal. No respiratory distress. No wheezes. No rales.  Abdominal: Soft. Bowel sounds are normal. Exhibits no distension and no mass. There is no tenderness.  Musculoskeletal: Normal range of motion. Exhibits no edema.  Lymphadenopathy:    No cervical adenopathy.  Neurological: Alert and oriented to person, place, and time. Exhibits normal muscle tone. Gait normal. Coordination  normal.  Skin: Skin is warm and dry. No rash noted. Not diaphoretic. No erythema. No pallor.  Psychiatric: Mood, memory and judgment normal.  Vitals reviewed.  LABORATORY DATA: Lab Results  Component Value Date   WBC 27.4 (H) 01/09/2020   HGB 11.1 (L) 01/09/2020   HCT 33.7 (L) 01/09/2020   MCV 90.3 01/09/2020   PLT 301 01/09/2020      Chemistry      Component Value Date/Time   NA 140 01/09/2020 1446   K 3.2 (L) 01/09/2020 1446   CL 104 01/09/2020 1446   CO2 28 01/09/2020 1446   BUN 18 01/09/2020 1446   CREATININE 0.84 01/09/2020 1446      Component Value Date/Time   CALCIUM 8.9 01/09/2020 1446   ALKPHOS 209 (H) 01/09/2020 1446   AST 26 01/09/2020 1446   ALT 27 01/09/2020 1446   BILITOT 0.8 01/09/2020 1446       RADIOGRAPHIC STUDIES:  CT  Chest W Contrast  Result Date: 12/12/2019 CLINICAL DATA:  Newly diagnosed right breast cancer EXAM: CT CHEST, ABDOMEN, AND PELVIS WITH CONTRAST TECHNIQUE: Multidetector CT imaging of the chest, abdomen and pelvis was performed following the standard protocol during bolus administration of intravenous contrast. CONTRAST:  155m ISOVUE-300 IOPAMIDOL (ISOVUE-300) INJECTION 61% COMPARISON:  None. FINDINGS: CT CHEST FINDINGS Cardiovascular: Heart is normal in size.  No pericardial effusion. No evidence of thoracic aortic aneurysm. Mediastinum/Nodes: No suspicious mediastinal, hilar, or left axillary lymphadenopathy. Clustered prominent right axillary nodes measuring up to 9 mm short axis (series 2/image 12), suspicious in this clinical context. Visualized thyroid is unremarkable. Lungs/Pleura: 3 mm subpleural nodule in the anterior right lower lobe (series 6/image 51). Minimal dependent atelectasis in the bilateral lower lobes. No focal consolidation. No pleural effusion or pneumothorax. Musculoskeletal: Prominent glandular tissue in the central right breast with nipple retraction (series 2/image 23), likely corresponding to the patient's known primary breast neoplasm. 11 mm sclerotic lesion along the left posterior aspect of the T7 vertebral body (sagittal image 108), raising concern for possible osseous metastasis. Vertebral hemangioma in the T11 vertebral body. CT ABDOMEN PELVIS FINDINGS Hepatobiliary: 2.4 cm peripheral enhancing lesion in segment 6 (series 2/image 45), indeterminate, metastasis not excluded. Additional scattered lesions measuring up to 10 mm in segment 2 (series 2/image 36), some of which may reflect benign cysts, but are too small to characterize. Gallbladder is unremarkable. No intrahepatic or extrahepatic ductal dilatation. Pancreas: Within normal limits. Spleen: Within normal limits. Adrenals/Urinary Tract: Adrenal glands are within normal limits. Kidneys are within normal limits.  No  hydronephrosis. Bladder is within normal limits. Stomach/Bowel: Stomach is within normal limits. No evidence of bowel obstruction. Normal appendix (series 2/image 69). Vascular/Lymphatic: No evidence of abdominal aortic aneurysm. Atherosclerotic calcifications of the abdominal aorta and branch vessels. No suspicious abdominopelvic lymphadenopathy. Reproductive: Heterogeneous uterus with a suspected 3.5 cm pedunculated fibroid along the right adnexa, although a right adnexal mass is also possible. Adjacent right ovary (series 2/image 88) is otherwise within normal limits. Left ovary is within normal limits. Other: No abdominopelvic ascites. Musculoskeletal: Degenerative changes at L3-4. IMPRESSION: Prominent glandular tissue in the central right breast with nipple retraction, likely corresponding to the patient's known right breast neoplasm. Prominent right axillary nodes measuring up to 8 mm short axis, suspicious for nodal metastases in this clinical context. 3 mm subpleural pulmonary nodule in the right lower lobe, likely benign. However, follow-up CT chest is suggested in 3-6 months. Multiple hepatic lesions, including a dominant 2.4 cm lesion in segment  6, which is considered indeterminate. Metastasis is not excluded. Consider MRI abdomen with/without contrast for further characterization. 11 mm sclerotic lesion along the left posterior aspect of the T7 vertebral body raises concern for isolated osseous metastasis. Correlate with priors if available. Benign vertebral hemangioma at T11. Electronically Signed   By: Julian Hy M.D.   On: 12/12/2019 07:50   MR Pelvis W Wo Contrast  Result Date: 12/24/2019 CLINICAL DATA:  History of metastatic breast cancer. Evaluate for liver metastasis in bone metastases. EXAM: MRI ABDOMEN AND PELVIS WITHOUT AND WITH CONTRAST TECHNIQUE: Multiplanar multisequence MR imaging of the abdomen and pelvis was performed both before and after the administration of intravenous  contrast. CONTRAST:  19m GADAVIST GADOBUTROL 1 MMOL/ML IV SOLN COMPARISON:  CT cap with contrast 12/11/2019 FINDINGS: COMBINED FINDINGS FOR BOTH MR ABDOMEN AND PELVIS Lower chest: No acute findings. Hepatobiliary: Corresponding to the recent CT abnormality are multiple T2 hyperintense liver lesions. The dominant lesion is in the posterior right hepatic lobe measuring 2 cm, image 16/19. This has enhancement characteristics consistent with a benign hemangioma. Similarly, there is a lesion within the posteromedial right lobe of liver which measures 1.2 cm, image 22/19. This also has enhancement characteristics compatible with a benign hemangioma. Four T2 hyperintense lesions are identified which on the postcontrast images exhibit peripheral enhancement without delayed fill-in, suspicious for metastatic lesions. The index lesion within segment 8/4 measures 1.2 cm, image 28/32. Index lesion within segment 4a measures 0.9 cm, image 38/32. Index lesion within segment 7/8 measures 4 mm, image 29/32. Index lesion within segment 2 measures 0.8 cm, image 28/34. Gallbladder is normal. No signs of gallbladder inflammation. No bile duct dilatation. Pancreas: No mass, inflammatory changes, or other parenchymal abnormality identified. Spleen:  Within normal limits in size and appearance. Adrenals/Urinary Tract: Normal appearance of the adrenal glands. No enhancing kidney mass or hydronephrosis. Several tiny, subcentimeter T2 hyperintense foci are noted within both kidneys which are too small to reliably characterize. Urinary bladder is unremarkable. Stomach/Bowel: Visualized portions within the abdomen are unremarkable. Vascular/Lymphatic: No aneurysm. No abdominal adenopathy. No pelvic or inguinal adenopathy identified. Reproductive: Posterior intramural fibroid is identified within the uterus measuring 1.4 cm. Well-circumscribed, mixed signal intensity structure within the right adnexal region IS no internal focus of face  indeterminate. This appears T1 hypointense and T2 hypointense with heterogeneous internal enhancement, image 21/41. It is unclear whether not this is arising from the right ovary, broad ligament or right side of fundus. Other:  No ascites identified. Musculoskeletal: Multifocal enhancing bone lesions are noted involving the bony pelvis. The largest lesion there is in the left posterior column of the left acetabulum measuring 2.8 cm, image 29/4. Within the right superior acetabulum there is a 1.5 cm enhancing lesion, image 23/41. Adjacent enhancing lesion measures 1.1 cm, image 21/4. Enhancing lesion within the left iliac wing measures 2.3 cm, image 13/41. Additional suspicious areas within the sacrum noted, image 14/42. Multiple enhancing lesions are noted within the lumbar spine. The largest localizes to the L3 vertebral body measuring 2.4 cm, image 66/32. IMPRESSION: 1. Multifocal enhancing bone lesions are noted within the lumbar spine, and bony pelvis compatible with metastatic disease. 2. Four, small peripherally enhancing cystic lesions are noted within the liver worrisome for metastatic disease. 3. 2 enhancing liver lesions are also noted with signal and enhancement characteristics consistent with benign liver hemangioma. 4. Well-circumscribed, enhancing T2 and T1 hypointense structure within the right adnexal region is indeterminate. It is unclear whether not this is arising  from the right ovary, broad ligament or right side of uterine fundus. Differential considerations include fibrothecoma, versus pedunculated uterine leiomyoma. Metastatic disease is considered less favored. Attention on follow-up imaging is recommended. Electronically Signed   By: Kerby Moors M.D.   On: 12/24/2019 12:22   MR Abdomen W Wo Contrast  Result Date: 12/24/2019 CLINICAL DATA:  History of metastatic breast cancer. Evaluate for liver metastasis in bone metastases. EXAM: MRI ABDOMEN AND PELVIS WITHOUT AND WITH CONTRAST  TECHNIQUE: Multiplanar multisequence MR imaging of the abdomen and pelvis was performed both before and after the administration of intravenous contrast. CONTRAST:  47m GADAVIST GADOBUTROL 1 MMOL/ML IV SOLN COMPARISON:  CT cap with contrast 12/11/2019 FINDINGS: COMBINED FINDINGS FOR BOTH MR ABDOMEN AND PELVIS Lower chest: No acute findings. Hepatobiliary: Corresponding to the recent CT abnormality are multiple T2 hyperintense liver lesions. The dominant lesion is in the posterior right hepatic lobe measuring 2 cm, image 16/19. This has enhancement characteristics consistent with a benign hemangioma. Similarly, there is a lesion within the posteromedial right lobe of liver which measures 1.2 cm, image 22/19. This also has enhancement characteristics compatible with a benign hemangioma. Four T2 hyperintense lesions are identified which on the postcontrast images exhibit peripheral enhancement without delayed fill-in, suspicious for metastatic lesions. The index lesion within segment 8/4 measures 1.2 cm, image 28/32. Index lesion within segment 4a measures 0.9 cm, image 38/32. Index lesion within segment 7/8 measures 4 mm, image 29/32. Index lesion within segment 2 measures 0.8 cm, image 28/34. Gallbladder is normal. No signs of gallbladder inflammation. No bile duct dilatation. Pancreas: No mass, inflammatory changes, or other parenchymal abnormality identified. Spleen:  Within normal limits in size and appearance. Adrenals/Urinary Tract: Normal appearance of the adrenal glands. No enhancing kidney mass or hydronephrosis. Several tiny, subcentimeter T2 hyperintense foci are noted within both kidneys which are too small to reliably characterize. Urinary bladder is unremarkable. Stomach/Bowel: Visualized portions within the abdomen are unremarkable. Vascular/Lymphatic: No aneurysm. No abdominal adenopathy. No pelvic or inguinal adenopathy identified. Reproductive: Posterior intramural fibroid is identified within the  uterus measuring 1.4 cm. Well-circumscribed, mixed signal intensity structure within the right adnexal region IS no internal focus of face indeterminate. This appears T1 hypointense and T2 hypointense with heterogeneous internal enhancement, image 21/41. It is unclear whether not this is arising from the right ovary, broad ligament or right side of fundus. Other:  No ascites identified. Musculoskeletal: Multifocal enhancing bone lesions are noted involving the bony pelvis. The largest lesion there is in the left posterior column of the left acetabulum measuring 2.8 cm, image 29/4. Within the right superior acetabulum there is a 1.5 cm enhancing lesion, image 23/41. Adjacent enhancing lesion measures 1.1 cm, image 21/4. Enhancing lesion within the left iliac wing measures 2.3 cm, image 13/41. Additional suspicious areas within the sacrum noted, image 14/42. Multiple enhancing lesions are noted within the lumbar spine. The largest localizes to the L3 vertebral body measuring 2.4 cm, image 66/32. IMPRESSION: 1. Multifocal enhancing bone lesions are noted within the lumbar spine, and bony pelvis compatible with metastatic disease. 2. Four, small peripherally enhancing cystic lesions are noted within the liver worrisome for metastatic disease. 3. 2 enhancing liver lesions are also noted with signal and enhancement characteristics consistent with benign liver hemangioma. 4. Well-circumscribed, enhancing T2 and T1 hypointense structure within the right adnexal region is indeterminate. It is unclear whether not this is arising from the right ovary, broad ligament or right side of uterine fundus. Differential considerations include  fibrothecoma, versus pedunculated uterine leiomyoma. Metastatic disease is considered less favored. Attention on follow-up imaging is recommended. Electronically Signed   By: Kerby Moors M.D.   On: 12/24/2019 12:22   CT Abdomen Pelvis W Contrast  Result Date: 12/12/2019 CLINICAL DATA:   Newly diagnosed right breast cancer EXAM: CT CHEST, ABDOMEN, AND PELVIS WITH CONTRAST TECHNIQUE: Multidetector CT imaging of the chest, abdomen and pelvis was performed following the standard protocol during bolus administration of intravenous contrast. CONTRAST:  191m ISOVUE-300 IOPAMIDOL (ISOVUE-300) INJECTION 61% COMPARISON:  None. FINDINGS: CT CHEST FINDINGS Cardiovascular: Heart is normal in size.  No pericardial effusion. No evidence of thoracic aortic aneurysm. Mediastinum/Nodes: No suspicious mediastinal, hilar, or left axillary lymphadenopathy. Clustered prominent right axillary nodes measuring up to 9 mm short axis (series 2/image 12), suspicious in this clinical context. Visualized thyroid is unremarkable. Lungs/Pleura: 3 mm subpleural nodule in the anterior right lower lobe (series 6/image 51). Minimal dependent atelectasis in the bilateral lower lobes. No focal consolidation. No pleural effusion or pneumothorax. Musculoskeletal: Prominent glandular tissue in the central right breast with nipple retraction (series 2/image 23), likely corresponding to the patient's known primary breast neoplasm. 11 mm sclerotic lesion along the left posterior aspect of the T7 vertebral body (sagittal image 108), raising concern for possible osseous metastasis. Vertebral hemangioma in the T11 vertebral body. CT ABDOMEN PELVIS FINDINGS Hepatobiliary: 2.4 cm peripheral enhancing lesion in segment 6 (series 2/image 45), indeterminate, metastasis not excluded. Additional scattered lesions measuring up to 10 mm in segment 2 (series 2/image 36), some of which may reflect benign cysts, but are too small to characterize. Gallbladder is unremarkable. No intrahepatic or extrahepatic ductal dilatation. Pancreas: Within normal limits. Spleen: Within normal limits. Adrenals/Urinary Tract: Adrenal glands are within normal limits. Kidneys are within normal limits.  No hydronephrosis. Bladder is within normal limits. Stomach/Bowel:  Stomach is within normal limits. No evidence of bowel obstruction. Normal appendix (series 2/image 69). Vascular/Lymphatic: No evidence of abdominal aortic aneurysm. Atherosclerotic calcifications of the abdominal aorta and branch vessels. No suspicious abdominopelvic lymphadenopathy. Reproductive: Heterogeneous uterus with a suspected 3.5 cm pedunculated fibroid along the right adnexa, although a right adnexal mass is also possible. Adjacent right ovary (series 2/image 88) is otherwise within normal limits. Left ovary is within normal limits. Other: No abdominopelvic ascites. Musculoskeletal: Degenerative changes at L3-4. IMPRESSION: Prominent glandular tissue in the central right breast with nipple retraction, likely corresponding to the patient's known right breast neoplasm. Prominent right axillary nodes measuring up to 8 mm short axis, suspicious for nodal metastases in this clinical context. 3 mm subpleural pulmonary nodule in the right lower lobe, likely benign. However, follow-up CT chest is suggested in 3-6 months. Multiple hepatic lesions, including a dominant 2.4 cm lesion in segment 6, which is considered indeterminate. Metastasis is not excluded. Consider MRI abdomen with/without contrast for further characterization. 11 mm sclerotic lesion along the left posterior aspect of the T7 vertebral body raises concern for isolated osseous metastasis. Correlate with priors if available. Benign vertebral hemangioma at T11. Electronically Signed   By: SJulian HyM.D.   On: 12/12/2019 07:50   MR BREAST BILATERAL W WO CONTRAST INC CAD  Result Date: 12/12/2019 CLINICAL DATA:  59year old [place] [facility] with changes to her right breast. On her diagnostic workup, a suspicious 11 cm mass was found in the right breast. Multiple right axillary masses were seen as well. Multiple small masses were described in the left breast. MRI was recommended to  determine if there is one  or more of these masses in the left breast which should be specifically targeted for biopsy. Ultrasound-guided biopsy 11/25/2019 found grade 2 invasive mammary carcinoma with mammary carcinoma in situ with calcifications and necrosis of the right breast. Biopsy of a right axillary mass demonstrated invasive mammary carcinoma without definitive nodal tissue identified. Pathology of a biopsied left axillary lymph node was benign. LABS:  Creatinine of 0.97 mg/dL and GFR of greater than 60 on 12/09/2019 EXAM: BILATERAL BREAST MRI WITH AND WITHOUT CONTRAST TECHNIQUE: Multiplanar, multisequence MR images of both breasts were obtained prior to and following the intravenous administration of 8 ml of Gadavist Three-dimensional MR images were rendered by post-processing of the original MR data on an independent workstation. The three-dimensional MR images were interpreted, and findings are reported in the following complete MRI report for this study. Three dimensional images were evaluated at the independent interpreting workstation using the DynaCAD thin client. COMPARISON:  No prior MRI available for comparison. Correlation made with prior mammograms and ultrasound images. FINDINGS: Breast composition: c. Heterogeneous fibroglandular tissue. Background parenchymal enhancement: Moderate. Right breast: There is diffuse abnormal enhancement throughout the right breast involving all 4 quadrants with predominantly persistent kinetics. The enhancement measures 9.0 x 6.6 x 9.0 cm. Marked nipple retraction is noted with some mild diffuse skin thickening. There is enhancement within the skin at the level of the nipple (series 5 and 6, image 93). There is marked edema, expansion and enhancement within the pectoralis major musculature (series 8, image 49). The enhancement within the muscle measures at least 6-7 cm. Left breast: There is diffuse nodular non mass enhancement throughout the left breast. All of this enhancement  demonstrates persistent kinetics, similar to the known cancer on the right. There is a cluster of this nodular enhancement in the upper-outer quadrant of the left breast (series 6, image 79) and an additional prominent cluster in the lower outer quadrant (series 6, image 104). Lymph nodes: Multiple matted masses are seen in the right axilla, at least 5-6, which may represent abnormal lymph nodes versus primary tumor. There is one abnormal-appearing Rotter's node measuring 1.5 cm in long axis (series 5, image 37) Ancillary findings: Abnormal enhancement in the superolateral right pectoralis major muscle as described under the "right breast" section. IMPRESSION: 1. The biopsy proven malignancy in the right breast involves all 4 quadrants and spans up to 9 cm by MRI. There is enhancement within the skin at the level of the nipple. 2. Multiple matted masses are seen in the right axilla, either abnormal metastatic lymph nodes or a combination of masses and metastatic lymph nodes. One of these has been previously biopsied showing invasive mammary carcinoma without definitive nodal tissue. 3.  There is a 1.5 cm Rotter's lymph node on the right. 4. There is markedly abnormal enhancement and edema in the right pectoralis major muscle spanning 6-7 cm, suspicious for metastatic disease. 5. There is diffuse nodular enhancement throughout the left breast, which may correspond with the solid-appearing masses seen on ultrasound. These all have a similar appearance on MRI. 6.  No findings of left axillary lymphadenopathy. RECOMMENDATION: 1. Ultrasound-guided biopsy is recommended for 3 of the left breast masses on ultrasound, to include the largest mass at 10:30, the irregular vascular mass at 11 o'clock, and the mass at 5 o'clock. 2. MRI guided biopsy may be performed of the left breast following ultrasound-guided biopsies, for the nodular enhancement in the upper outer posterior left breast. MRI guided biopsy may  be considered  for the nodular enhancement in the lower outer quadrant of the left breast if this is necessary depending on the ultrasound biopsy results and recommendaions. BI-RADS CATEGORY  4: Suspicious. Electronically Signed   By: Ammie Ferrier M.D.   On: 12/12/2019 14:29   US Abdomen Limited  Result Date: 01/05/2020 CLINICAL DATA:  Pain following liver biopsy EXAM: ULTRASOUND ABDOMEN LIMITED RIGHT UPPER QUADRANT COMPARISON:  Abdominal MRI December 24, 2019. Ultrasound images obtained during liver biopsy January 01, 2020 FINDINGS: Gallbladder: No gallstones or wall thickening visualized. There is no pericholecystic fluid. No sonographic Murphy sign noted by sonographer. Common bile duct: Diameter: 3 mm. No intrahepatic or extrahepatic biliary duct dilatation. Liver: There is an ill-defined area of mixed echogenicity in the right lobe of the liver medially measuring 3.9 x 4.0 x 3.5 cm which is present in the vicinity of the previous biopsy needle and does not correspond with a lesions seen on recent MR. Suspect area of hematoma. This area is rather ill-defined. No perihepatic fluid is evident. There is an echogenic lesion in the right lobe of the liver measuring 1.9 x 2.1 x 2.1 cm which corresponds to a mass seen in this area on CT. There is a mass also noted in the right lobe of the liver measuring 1.9 x 1.7 cm which is fairly well-defined. No other liver lesions evident. Several smaller liver lesions seen on recent MR are not appreciable by ultrasound. Within normal limits in parenchymal echogenicity. Portal vein is patent on color Doppler imaging with normal direction of blood flow towards the liver. Other: None. IMPRESSION: 1. Suspect hematoma in the medial right lobe of the liver in the area of previous biopsy. Note that recent MR does not show lesion in this area. This area has ill-defined margins and measures 3.9 x 4.0 x 3.5 cm. No perihepatic fluid. 2. Probable hemangiomas elsewhere in the right lobe of the  liver. Note that several smaller lesions seen on recent MR are not appreciable by ultrasound. 3.  Study otherwise unremarkable. These results will be called to the ordering clinician or representative by the Radiologist Assistant, and communication documented in the PACS or [employer]. Electronically Signed   By: Lowella Grip III M.D.   On: 01/05/2020 12:04   US BIOPSY (LIVER)  Result Date: 01/01/2020 INDICATION: History of breast cancer, now with indeterminate liver lesions worrisome for metastatic disease. Please perform ultrasound-guided liver lesion biopsy for tissue diagnostic purposes. EXAM: ULTRASOUND GUIDED LIVER LESION BIOPSY COMPARISON:  Abdominal MRI-12/24/2019; CT abdomen and pelvis-12/11/2019 MEDICATIONS: None ANESTHESIA/SEDATION: Fentanyl 50 mcg IV; Versed 1 mg IV Total Moderate Sedation time:  16 Minutes. The patient's level of consciousness and vital signs were monitored continuously by radiology nursing throughout the procedure under my direct supervision. COMPLICATIONS: None immediate. PROCEDURE: Informed written consent was obtained from the patient after a discussion of the risks, benefits and alternatives to treatment. The patient understands and consents the procedure. A timeout was performed prior to the initiation of the procedure. Ultrasound scanning was performed of the right upper abdominal quadrant demonstrates punctate (approximately 0.7 x 0.7 cm) hypoechoic/anechoic nodule within the anterior aspect of the dome of the right lobe of the liver (image 7), correlating with the lesion seen on preceding abdominal CT image 41, series 2 and the worrisome lesions seen on abdominal MRI image 36, series 34. The procedure was planned. The right upper abdominal quadrant was prepped and draped in the usual sterile fashion. The overlying soft tissues were anesthetized  with 1% lidocaine with epinephrine. A 17 gauge, 6.8 cm co-axial needle was advanced into a peripheral aspect of the  lesion. This was followed by 6 core biopsies with an 18 gauge core device under direct ultrasound guidance. The coaxial needle tract was embolized with a small amount of Gel-Foam slurry and superficial hemostasis was obtained with manual compression. Post procedural scanning was negative for definitive area of hemorrhage or additional complication. A dressing was placed. The patient tolerated the procedure well without immediate post procedural complication. IMPRESSION: Technically successful ultrasound guided core needle biopsy of punctate (approximately 0.7 cm) hypoechoic/anechoic nodule within the anterior aspect of the dome of the right lobe of the liver. If this biopsy proves nondiagnostic secondary to the small size of the nodule, would recommend preceding image guided biopsy of one of the worrisome osseous lesions within the pelvis, likely lesion involving the anterior aspect of the left ilium seen on preceding pelvic MRI image 12, series 14. Electronically Signed   By: Sandi Mariscal M.D.   On: 01/01/2020 16:57   DG CHEST PORT 1 VIEW  Result Date: 12/15/2019 CLINICAL DATA:  Breast carcinoma.  Port-A-Cath placement. EXAM: PORTABLE CHEST 1 VIEW COMPARISON:  02/08/2013 FINDINGS: Left-sided power port is seen with tip overlying the superior cavoatrial junction. No evidence of pneumothorax or pleural effusion. Both lungs are clear. Heart size is normal. Oral contrast material is noted in the splenic flexure of colon. IMPRESSION: Left-sided power port in appropriate position. No evidence of pneumothorax. No active disease. Electronically Signed   By: Marlaine Hind M.D.   On: 12/15/2019 09:20   DG Fluoro Guide CV Line-No Report  Result Date: 12/15/2019 Fluoroscopy was utilized by the requesting physician.  No radiographic interpretation.   ECHOCARDIOGRAM COMPLETE  Result Date: 12/16/2019    ECHOCARDIOGRAM REPORT   Patient Name:   KIMBLERY DIOP Date of Exam: 12/16/2019 Medical Rec #:  [id number]      Height:        67.0 in Accession #:    [id number]     Weight:       194.0 lb Date of Birth:  1960/05/21      BSA:          1.996 m Patient Age:    52 years       BP:           166/71 mmHg Patient Gender: F              HR:           81 bpm. Exam Location:  Outpatient Procedure: 2D Echo Indications:    Chemo V67.2 / Z09  History:        Patient has no prior history of Echocardiogram examinations.                 Risk Factors:Hypertension. Breast Cancer.  Sonographer:    Darlina Sicilian RDCS Referring Phys: [id number] [place]  1. Left ventricular ejection fraction, by estimation, is 60 to 65%. The left ventricle has normal function. The left ventricle has no regional wall motion abnormalities. Left ventricular diastolic parameters are consistent with Grade I diastolic dysfunction (impaired relaxation).  2. Right ventricular systolic function is normal. The right ventricular size is normal.  3. The mitral valve is normal in structure. No evidence of mitral valve regurgitation. No evidence of mitral stenosis.  4. The aortic valve is normal in structure. Aortic valve regurgitation is not visualized. No aortic stenosis is present.  5. The  inferior vena cava is normal in size with greater than 50% respiratory variability, suggesting right atrial pressure of 3 mmHg. Comparison(s): No prior Echocardiogram. Strain imaging measurements are inaccurate due to poor tissue tracking. FINDINGS  Left Ventricle: Left ventricular ejection fraction, by estimation, is 60 to 65%. The left ventricle has normal function. The left ventricle has no regional wall motion abnormalities. The left ventricular internal cavity size was normal in size. There is  no left ventricular hypertrophy. Left ventricular diastolic parameters are consistent with Grade I diastolic dysfunction (impaired relaxation). Indeterminate filling pressures. Right Ventricle: The right ventricular size is normal. No increase in right ventricular wall thickness. Right  ventricular systolic function is normal. Left Atrium: Left atrial size was normal in size. Right Atrium: Right atrial size was normal in size. Pericardium: There is no evidence of pericardial effusion. Mitral Valve: The mitral valve is normal in structure. Normal mobility of the mitral valve leaflets. No evidence of mitral valve regurgitation. No evidence of mitral valve stenosis. Tricuspid Valve: The tricuspid valve is normal in structure. Tricuspid valve regurgitation is not demonstrated. No evidence of tricuspid stenosis. Aortic Valve: The aortic valve is normal in structure. Aortic valve regurgitation is not visualized. No aortic stenosis is present. Pulmonic Valve: The pulmonic valve was normal in structure. Pulmonic valve regurgitation is not visualized. No evidence of pulmonic stenosis. Aorta: The aortic root is normal in size and structure. Venous: The inferior vena cava is normal in size with greater than 50% respiratory variability, suggesting right atrial pressure of 3 mmHg. IAS/Shunts: No atrial level shunt detected by color flow Doppler.  LEFT VENTRICLE PLAX 2D LVIDd:         4.40 cm  Diastology LVIDs:         3.10 cm  LV e' lateral:   6.53 cm/s LV PW:         0.80 cm  LV E/e' lateral: 11.5 LV IVS:        1.10 cm  LV e' medial:    6.74 cm/s LVOT diam:     2.00 cm  LV E/e' medial:  11.1 LV SV:         49 LV SV Index:   24 LVOT Area:     3.14 cm                          3D Volume EF:                         3D EF:        56 %                         LV EDV:       205 ml                         LV ESV:       91 ml                         LV SV:        114 ml RIGHT VENTRICLE RV S prime:     12.40 cm/s TAPSE (M-mode): 1.9 cm LEFT ATRIUM             Index       RIGHT ATRIUM  Index LA diam:        2.70 cm 1.35 cm/m  RA Area:     13.20 cm LA Vol (A2C):   19.5 ml 9.77 ml/m  RA Volume:   30.10 ml  15.08 ml/m LA Vol (A4C):   37.9 ml 18.99 ml/m LA Biplane Vol: 28.1 ml 14.08 ml/m  AORTIC VALVE LVOT  Vmax:   97.30 cm/s LVOT Vmean:  61.200 cm/s LVOT VTI:    0.155 m  AORTA Ao Root diam: 3.50 cm Ao Asc diam:  3.90 cm MITRAL VALVE MV Area (PHT): 4.60 cm    SHUNTS MV Decel Time: 165 msec    Systemic VTI:  0.16 m MV E velocity: 75.00 cm/s  Systemic Diam: 2.00 cm MV A velocity: 93.00 cm/s MV E/A ratio:  0.81 Mihai Croitoru MD Electronically signed by Sanda Klein MD Signature Date/Time: 12/16/2019/12:48:40 PM    Final      ASSESSMENT/PLAN:  Demiya Magno is a 59 y.o. 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 Her right breast and LN biopsy was positive for invasiveductalcarcinoma with components of DCIS, calcifications and necrosis in the breast. Her left axillarynodebiopsy was benign. -Given liver lesions and bone lesions (left L3, right 10th rib, left pelvis) concerning for metastatic disease she underwent liver biopsy on 01/01/20 which was negative for malignant cells but Dr. Burr Medico still has a high suspicion that she has metastatic disease, especially bone mets. Dr. Burr Medico recommended a bone biopsy which is scheduled for 01/12/20. -She was also seen to have left breast mass on her 12/12/19 MRI. A left breast biopsy would be recommended only if shedoes not havemetastatic breast cancer. Will hold for now.  -Her treatment has been delayed due to the metastatic work-up -In the meantime, Dr. Burr Medico recommend proceeding with chemo docetaxel, trastuzumab and Perjeta to start treating her disease. She did not recommend TCHP due to her probable metastatic disease and no longer eligible for surgery. -She is status post her first cycle of treatment and tolerated it well.   -F/U in 2 weeks with cycle #2.   2. Diarrhea, Constipation, and vaginal prolaps -The patient has been experiencing constipation following her first cycle of treatment.  Therefore, she used 2 bottles of MiraLAX on 01/08/20 which ended up causing significant diarrhea. -The patient was seen in the emergency room yesterday for the diarrhea as well as vaginal prolapse.  -The patient's diarrhea has since resolved and she has not had any more episodes of diarrhea since yesterday.  -I have a low suspicion of the patient's diarrhea being caused by her chemotherapy and believe it is likely secondary to the MiraLAX use given the timeframe at which the diarrhea occurred. -The patient is well-appearing today, afebrile, and nontoxic appearing. Does not appear dehydrated. Creatinine WNL.  -Constipation education was given to the patient.  I encouraged to try conservative measures such as increasing activity, drinking plenty of fluids, and eating fruits and vegetables if she experiences constipation after her next cycle of treatment.  Also discussed that she may use stool softeners to prevent constipation.  If she needs to use a laxative in the future, encouraged her to try to use half a bottle initially. -I advised the patient to call us if new or worsening  symptoms. -She will follow up with her OB/GYN regarding the vaginal prolapse  3. Hypokalemia -Likely secondary to #2 -I prescribed potassium chloride 20 meq once daily for the next 5-7 days.  -Will monitor  4. Insomnia -I encouraged the patient to try either tylenol PM or melatonin  5. HTN -Well controlled on Metoprolol and Amlodipine. Continue to f/u with PCP  6. Severe upper body pain, new anemia and transaminitis, probable intrahepatic bleeding after biopsy -She had severe RUQ abdominal and back pain starting 2 hours after liver biopsy (01/01/20). She required percocet to reduce her pain to intermittent flares with any movement last night. Dr Burr Medico  reviewed with Dr. Pascal Lux who agreed that she probably had bleeding after biopsy, given her much improved pain, bleeding likely has stopped, will observe for now. -Hgb 11.1 today. Pain is improved.  Transaminitis has resolved as well.  PLAN: -Continue with bone biopsy on 01/12/20 as scheduled -Labs, return visit, and infusion in 2 weeks as scheduled.   -Constipation education given -Potassium sent to pharmacy    No orders of the defined types were placed in this encounter.    Maral Lampe L Kasarah Sitts, PA-C 01/09/20

## 2020-01-08 ENCOUNTER — Other Ambulatory Visit: Payer: Self-pay | Admitting: Radiology

## 2020-01-08 ENCOUNTER — Telehealth: Payer: Self-pay

## 2020-01-08 NOTE — Telephone Encounter (Signed)
Carolyn Stewart called stating she is still constipated even after taking the miralax.  I returned her call.  She states the miralax is starting to work now.  She is taking it once a day.  I told her she can take it 2 times daily.  She verbalized understanding.  She states she is having trouble sleeping.  I encouraged her to discuss this with Cassie tomorrow during her appt.  She verbalized understanding.

## 2020-01-09 ENCOUNTER — Inpatient Hospital Stay (HOSPITAL_BASED_OUTPATIENT_CLINIC_OR_DEPARTMENT_OTHER): Payer: Commercial Managed Care - PPO | Admitting: Physician Assistant

## 2020-01-09 ENCOUNTER — Emergency Department (HOSPITAL_COMMUNITY)
Admission: EM | Admit: 2020-01-09 | Discharge: 2020-01-09 | Disposition: A | Discharge status: Home / Self Care | Payer: Commercial Managed Care - PPO | Attending: Emergency Medicine | Admitting: Emergency Medicine

## 2020-01-09 ENCOUNTER — Inpatient Hospital Stay: Payer: Commercial Managed Care - PPO

## 2020-01-09 ENCOUNTER — Other Ambulatory Visit: Payer: Self-pay

## 2020-01-09 ENCOUNTER — Encounter (HOSPITAL_COMMUNITY): Payer: Self-pay

## 2020-01-09 ENCOUNTER — Ambulatory Visit: Payer: Commercial Managed Care - PPO

## 2020-01-09 VITALS — BP 117/71 | HR 101 | Temp 99.1°F | Resp 18 | Ht 67.0 in | Wt 190.3 lb

## 2020-01-09 DIAGNOSIS — C50411 Malignant neoplasm of upper-outer quadrant of right female breast: Secondary | ICD-10-CM

## 2020-01-09 DIAGNOSIS — R197 Diarrhea, unspecified: Secondary | ICD-10-CM | POA: Insufficient documentation

## 2020-01-09 DIAGNOSIS — Z79899 Other long term (current) drug therapy: Secondary | ICD-10-CM | POA: Diagnosis not present

## 2020-01-09 DIAGNOSIS — Z853 Personal history of malignant neoplasm of breast: Secondary | ICD-10-CM | POA: Diagnosis not present

## 2020-01-09 DIAGNOSIS — E876 Hypokalemia: Secondary | ICD-10-CM | POA: Diagnosis not present

## 2020-01-09 DIAGNOSIS — K5900 Constipation, unspecified: Secondary | ICD-10-CM | POA: Diagnosis not present

## 2020-01-09 DIAGNOSIS — Z170 Estrogen receptor positive status [ER+]: Secondary | ICD-10-CM

## 2020-01-09 DIAGNOSIS — R195 Other fecal abnormalities: Secondary | ICD-10-CM

## 2020-01-09 DIAGNOSIS — Z5112 Encounter for antineoplastic immunotherapy: Secondary | ICD-10-CM | POA: Diagnosis not present

## 2020-01-09 DIAGNOSIS — I10 Essential (primary) hypertension: Secondary | ICD-10-CM | POA: Diagnosis not present

## 2020-01-09 DIAGNOSIS — N814 Uterovaginal prolapse, unspecified: Secondary | ICD-10-CM | POA: Diagnosis not present

## 2020-01-09 DIAGNOSIS — N812 Incomplete uterovaginal prolapse: Secondary | ICD-10-CM

## 2020-01-09 DIAGNOSIS — R102 Pelvic and perineal pain: Secondary | ICD-10-CM | POA: Insufficient documentation

## 2020-01-09 LAB — CMP (CANCER CENTER ONLY)
ALT: 27 U/L (ref 0–44)
AST: 26 U/L (ref 15–41)
Albumin: 3.7 g/dL (ref 3.5–5.0)
Alkaline Phosphatase: 209 U/L — ABNORMAL HIGH (ref 38–126)
Anion gap: 8 (ref 5–15)
BUN: 18 mg/dL (ref 6–20)
CO2: 28 mmol/L (ref 22–32)
Calcium: 8.9 mg/dL (ref 8.9–10.3)
Chloride: 104 mmol/L (ref 98–111)
Creatinine: 0.84 mg/dL (ref 0.44–1.00)
GFR, Est AFR Am: >60 mL/min (ref >60)
GFR, Estimated: >60 mL/min (ref >60)
Glucose, Bld: 94 mg/dL (ref 70–99)
Potassium: 3.2 mmol/L — ABNORMAL LOW (ref 3.5–5.1)
Sodium: 140 mmol/L (ref 135–145)
Total Bilirubin: 0.8 mg/dL (ref 0.3–1.2)
Total Protein: 7.1 g/dL (ref 6.5–8.1)

## 2020-01-09 LAB — CBC WITH DIFFERENTIAL (CANCER CENTER ONLY)
Abs Immature Granulocytes: 1.97 10*3/uL — ABNORMAL HIGH (ref 0.00–0.07)
Basophils Absolute: 0.1 10*3/uL (ref 0.0–0.1)
Basophils Relative: 0 %
Eosinophils Absolute: 0.1 10*3/uL (ref 0.0–0.5)
Eosinophils Relative: 0 %
HCT: 33.7 % — ABNORMAL LOW (ref 36.0–46.0)
Hemoglobin: 11.1 g/dL — ABNORMAL LOW (ref 12.0–15.0)
Immature Granulocytes: 7 %
Lymphocytes Relative: 20 %
Lymphs Abs: 5.5 10*3/uL — ABNORMAL HIGH (ref 0.7–4.0)
MCH: 29.8 pg (ref 26.0–34.0)
MCHC: 32.9 g/dL (ref 30.0–36.0)
MCV: 90.3 fL (ref 80.0–100.0)
Monocytes Absolute: 1.7 10*3/uL — ABNORMAL HIGH (ref 0.1–1.0)
Monocytes Relative: 6 %
Neutro Abs: 18.4 10*3/uL — ABNORMAL HIGH (ref 1.7–7.7)
Neutrophils Relative %: 67 %
Platelet Count: 301 10*3/uL (ref 150–400)
RBC: 3.73 MIL/uL — ABNORMAL LOW (ref 3.87–5.11)
RDW: 13.9 % (ref 11.5–15.5)
WBC Count: 27.4 10*3/uL — ABNORMAL HIGH (ref 4.0–10.5)
nRBC: 1.2 % — ABNORMAL HIGH (ref 0.0–0.2)

## 2020-01-09 MED ORDER — HEPARIN SOD (PORK) LOCK FLUSH 100 UNIT/ML IV SOLN
500.0000 [IU] | Freq: Once | INTRAVENOUS | Status: AC
  Administered 2020-01-09: 500 [IU]
  Filled 2020-01-09: qty 5

## 2020-01-09 MED ORDER — SODIUM CHLORIDE 0.9% FLUSH
10.0000 mL | Freq: Once | INTRAVENOUS | Status: AC
  Administered 2020-01-09: 10 mL
  Filled 2020-01-09: qty 10

## 2020-01-09 MED ORDER — POTASSIUM CHLORIDE CRYS ER 20 MEQ PO TBCR: 20.0000 meq | EXTENDED_RELEASE_TABLET | Freq: Every day | ORAL | 0 refills | Status: DC

## 2020-01-09 NOTE — ED Triage Notes (Signed)
Patient arrived stating she has had diarrhea today. States she started having lower abdominal pain around 4pm and noticed something protruding from her vagina. Concerned she has had a prolapse.

## 2020-01-09 NOTE — Discharge Instructions (Signed)
Follow-up with oncology, as planned. Follow-up with OB/GYN regarding suspected prolapse.  Your blood pressure was higher than normal today. Please have this rechecked by primary care provider.  Return to the emergency department for worse abdominal pain, persistent vomiting, bloody diarrhea, inability to have a bowel movement, inability to urinate, or any other major concerns.

## 2020-01-09 NOTE — ED Provider Notes (Signed)
[facility] DEPT Provider Note   CSN: [id number] Arrival date & time: 01/09/20  0045     History Chief Complaint  Patient presents with  . Vaginal Prolapse    Possible  . Diarrhea    Carolyn Stewart is a 60 y.o. female.  HPI      Carolyn Stewart is a 59 y.o. female, with a history of breast cancer, HTN, presenting to the ED with 2 concerns.  Diarrhea: Patient was experiencing constipation for the past week. She has been taking MiraLAX daily for the past week. Earlier today (9/16), she also used a laxative suppository. After this, she began having multiple bowel movements containing stool. She states this evening she has been having watery bowel movements, but none the last several hours. She had her first chemotherapy treatment 9/11 for breast cancer.  Concern for prolapse: Patient noted lower abdominal pressure and then noted something protruding from her vagina.  She has not had this issue before.  Denies fever/chills, N/V, difficulty urinating, dysuria, hematochezia/melena, chest pain, shortness of breath, abdominal pain, or any other complaints.  Past Medical History:  Diagnosis Date  . Cancer ([place]) 2021  . Hypertension     Patient Active Problem List   Diagnosis Date Noted  . Malignant neoplasm of upper-outer quadrant of right breast in female, estrogen receptor positive ([place]) 12/01/2019    Past Surgical History:  Procedure Laterality Date  . left parotid tumor removal   2006  . PORTACATH PLACEMENT Left 12/15/2019   Procedure: INSERTION PORT-A-CATH WITH ULTRASOUND GUIDANCE;  Surgeon: [name], MD;  Location: [facility];  Service: General;  Laterality: Left;  . TONSILLECTOMY       OB History   No obstetric history on file.     Family History  Problem Relation Age of Onset  . Hypertension Mother   . Hypertension Father     Social History   Tobacco Use  . Smoking status: Never Smoker  . Smokeless tobacco: Never Used    Vaping Use  . Vaping Use: Never used  Substance Use Topics  . Alcohol use: Yes    Comment: social drinker   . Drug use: No    Home Medications Prior to Admission medications   Medication Sig Start Date End Date Taking? Authorizing Provider  acetaminophen (TYLENOL) 500 MG tablet Take 1,000 mg by mouth every 6 (six) hours as needed for moderate pain or headache.    [provider]  amLODipine (NORVASC) 10 MG tablet Take 10 mg by mouth daily. 11/18/19   [provider]  Cholecalciferol (VITAMIN D3) 50 MCG (2000 UT) TABS Take 2,000 Units by mouth daily.     [provider]  dexamethasone (DECADRON) 4 MG tablet Take 2 tablets (8 mg total) by mouth 2 (two) times daily. Start the day before Taxotere. Then take daily x 3 days after chemotherapy. 12/03/19   Truitt Merle, MD  lidocaine-prilocaine (EMLA) cream Apply to affected area once 12/03/19   Truitt Merle, MD  metoprolol succinate (TOPROL-XL) 50 MG 24 hr tablet Take 50 mg by mouth daily. 11/18/19   [provider]  omeprazole (PRILOSEC OTC) 20 MG tablet Take 20 mg by mouth daily.    [provider]  ondansetron (ZOFRAN) 8 MG tablet Take 1 tablet (8 mg total) by mouth 2 (two) times daily as needed (Nausea or vomiting). Start on the third day after chemotherapy. 12/03/19   Truitt Merle, MD  oxyCODONE (OXY IR/ROXICODONE) 5 MG immediate release tablet Take  1 tablet (5 mg total) by mouth every 6 (six) hours as needed for severe pain. Patient not taking: Reported on 12/30/2019 12/15/19   [name], MD  prochlorperazine (COMPAZINE) 10 MG tablet Take 1 tablet (10 mg total) by mouth every 6 (six) hours as needed (Nausea or vomiting). 12/03/19   Truitt Merle, MD    Allergies    Patient has no known allergies.  Review of Systems   Review of Systems  Constitutional: Negative for chills and fever.  Respiratory: Negative for shortness of breath.   Cardiovascular: Negative for chest pain.  Gastrointestinal: Positive for  diarrhea. Negative for nausea and vomiting.  Genitourinary: Negative for difficulty urinating, dysuria, flank pain, hematuria and vaginal bleeding.       Pelvic pressure  Musculoskeletal: Negative for back pain.  Neurological: Negative for syncope and weakness.  All other systems reviewed and are negative.   Physical Exam Updated Vital Signs BP (!) 184/103 (BP Location: Left Arm)   Pulse 90   Temp 98.2 F (36.8 C) (Oral)   Resp 17   LMP 01/19/2013   SpO2 97%   Physical Exam Vitals and nursing note reviewed.  Constitutional:      General: She is not in acute distress.    Appearance: She is well-developed. She is not diaphoretic.  HENT:     Head: Normocephalic and atraumatic.     Mouth/Throat:     Mouth: Mucous membranes are moist.  Eyes:     Conjunctiva/sclera: Conjunctivae normal.  Cardiovascular:     Rate and Rhythm: Normal rate and regular rhythm.     Pulses: Normal pulses.          Radial pulses are 2+ on the right side and 2+ on the left side.  Pulmonary:     Effort: Pulmonary effort is normal. No respiratory distress.  Abdominal:     General: Bowel sounds are normal.     Palpations: Abdomen is soft.     Tenderness: There is no abdominal tenderness. There is no guarding.  Genitourinary:    Comments: External genitalia normal Vagina without discharge Cervix - cervix appears to be present in the lower vaginal vault. I was able to gently push this back into place and patient experienced complete relief of her symptoms of pelvic pressure. negative for cervical motion tenderness Adnexa palpated, no masses, negative for tenderness noted Bladder palpated negative for tenderness Uterus palpated no masses, negative for tenderness  No inguinal lymphadenopathy. Otherwise normal female genitalia. RN, Earnest Bailey, served as Producer, television/film/video during exam. Musculoskeletal:     Cervical back: Neck supple.  Skin:    General: Skin is warm and dry.  Neurological:     Mental Status: She is  alert.  Psychiatric:        Mood and Affect: Mood and affect normal.        Speech: Speech normal.        Behavior: Behavior normal.     ED Results / Procedures / Treatments   Labs (all labs ordered are listed, but only abnormal results are displayed) Labs Reviewed - No data to display  EKG None  Radiology No results found.  Procedures Procedures (including critical care time)  Medications Ordered in ED Medications - No data to display  ED Course  I have reviewed the triage vital signs and the nursing notes.  Pertinent labs & imaging results that were available during my care of the patient were reviewed by me and considered in my medical decision making (see  chart for details).    MDM Rules/Calculators/A&P                          Patient presents with complaints of loose stools as well as onset of pelvic pressure and concern for prolapse. Patient is nontoxic appearing, afebrile, not tachycardic, not tachypneic, not hypotensive, and is in no apparent distress.  No abdominal tenderness. After speculum exam, patient experienced relief in her pelvic pressure.  She is having no difficulty urinating. She will follow-up with OB/GYN on this matter. I have reviewed the patient's chart to obtain more information.   We discussed her loose stools and how to proceed.   I suspect it may be a result of use of laxatives.  My suspicion for emergent surgical pathology or other considerations, such as bowel obstruction, is low based on history and physical exam.   Shared decision-making was used regarding any further work-up here in the ED, such as labs and/or imaging.  We decided upon a plan that includes continued hydration at home and reassessment tomorrow in the office. She has an appointment with her oncologist later today and will discuss the whether some of her symptoms are side effects from the chemotherapy.  The patient was given instructions for home care as well as return  precautions. Patient voices understanding of these instructions, accepts the plan, and is comfortable with discharge.  Findings and plan of care discussed with Merrily Pew, MD.      Final Clinical Impression(s) / ED Diagnoses Final diagnoses:  Loose stools  Uterine prolapse    Rx / DC Orders ED Discharge Orders    None       Layla Maw 01/09/20 0324    Mesner, Corene Cornea, MD 01/09/20 [id number]

## 2020-01-12 ENCOUNTER — Encounter (HOSPITAL_COMMUNITY): Payer: Self-pay

## 2020-01-12 ENCOUNTER — Other Ambulatory Visit: Payer: Self-pay

## 2020-01-12 ENCOUNTER — Encounter: Payer: Self-pay | Admitting: *Deleted

## 2020-01-12 ENCOUNTER — Ambulatory Visit (HOSPITAL_COMMUNITY)
Admission: RE | Admit: 2020-01-12 | Discharge: 2020-01-12 | Disposition: A | Discharge status: Home / Self Care | Payer: Commercial Managed Care - PPO | Source: Ambulatory Visit | Attending: Hematology | Admitting: Hematology

## 2020-01-12 DIAGNOSIS — Z79899 Other long term (current) drug therapy: Secondary | ICD-10-CM | POA: Diagnosis not present

## 2020-01-12 DIAGNOSIS — Z170 Estrogen receptor positive status [ER+]: Secondary | ICD-10-CM | POA: Insufficient documentation

## 2020-01-12 DIAGNOSIS — I10 Essential (primary) hypertension: Secondary | ICD-10-CM | POA: Insufficient documentation

## 2020-01-12 DIAGNOSIS — C7951 Secondary malignant neoplasm of bone: Secondary | ICD-10-CM | POA: Diagnosis not present

## 2020-01-12 DIAGNOSIS — Z95828 Presence of other vascular implants and grafts: Secondary | ICD-10-CM | POA: Diagnosis not present

## 2020-01-12 DIAGNOSIS — C50411 Malignant neoplasm of upper-outer quadrant of right female breast: Secondary | ICD-10-CM | POA: Diagnosis present

## 2020-01-12 LAB — CBC WITH DIFFERENTIAL/PLATELET
Abs Immature Granulocytes: 7.9 10*3/uL — ABNORMAL HIGH (ref 0.00–0.07)
Basophils Absolute: 0 10*3/uL (ref 0.0–0.1)
Basophils Relative: 0 %
Eosinophils Absolute: 0 10*3/uL (ref 0.0–0.5)
Eosinophils Relative: 0 %
HCT: 36 % (ref 36.0–46.0)
Hemoglobin: 11.5 g/dL — ABNORMAL LOW (ref 12.0–15.0)
Lymphocytes Relative: 6 %
Lymphs Abs: 5.3 10*3/uL — ABNORMAL HIGH (ref 0.7–4.0)
MCH: 30 pg (ref 26.0–34.0)
MCHC: 31.9 g/dL (ref 30.0–36.0)
MCV: 94 fL (ref 80.0–100.0)
Metamyelocytes Relative: 3 %
Monocytes Absolute: 4.4 10*3/uL — ABNORMAL HIGH (ref 0.1–1.0)
Monocytes Relative: 5 %
Myelocytes: 5 %
Neutro Abs: 70.5 10*3/uL — ABNORMAL HIGH (ref 1.7–7.7)
Neutrophils Relative %: 80 %
Platelets: 240 10*3/uL (ref 150–400)
Promyelocytes Relative: 1 %
RBC: 3.83 MIL/uL — ABNORMAL LOW (ref 3.87–5.11)
RDW: 15.6 % — ABNORMAL HIGH (ref 11.5–15.5)
WBC: 88.1 10*3/uL (ref 4.0–10.5)
nRBC: 0.9 % — ABNORMAL HIGH (ref 0.0–0.2)

## 2020-01-12 LAB — BASIC METABOLIC PANEL
Anion gap: 8 (ref 5–15)
BUN: 25 mg/dL — ABNORMAL HIGH (ref 6–20)
CO2: 25 mmol/L (ref 22–32)
Calcium: 9 mg/dL (ref 8.9–10.3)
Chloride: 105 mmol/L (ref 98–111)
Creatinine, Ser: 0.86 mg/dL (ref 0.44–1.00)
GFR calc Af Amer: >60 mL/min (ref >60)
GFR calc non Af Amer: >60 mL/min (ref >60)
Glucose, Bld: 114 mg/dL — ABNORMAL HIGH (ref 70–99)
Potassium: 4 mmol/L (ref 3.5–5.1)
Sodium: 138 mmol/L (ref 135–145)

## 2020-01-12 LAB — PROTIME-INR
INR: 1 (ref 0.8–1.2)
Prothrombin Time: 13.1 seconds (ref 11.4–15.2)

## 2020-01-12 MED ORDER — MIDAZOLAM HCL 2 MG/2ML IJ SOLN
INTRAMUSCULAR | Status: AC
  Filled 2020-01-12: qty 2

## 2020-01-12 MED ORDER — FLUMAZENIL 0.5 MG/5ML IV SOLN
INTRAVENOUS | Status: AC
  Filled 2020-01-12: qty 5

## 2020-01-12 MED ORDER — MIDAZOLAM HCL 2 MG/2ML IJ SOLN
INTRAMUSCULAR | Status: AC | PRN
  Administered 2020-01-12 (×3): 1 mg via INTRAVENOUS

## 2020-01-12 MED ORDER — LIDOCAINE HCL (PF) 1 % IJ SOLN
INTRAMUSCULAR | Status: AC | PRN
  Administered 2020-01-12: 20 mL via INTRADERMAL

## 2020-01-12 MED ORDER — FENTANYL CITRATE (PF) 100 MCG/2ML IJ SOLN
INTRAMUSCULAR | Status: AC | PRN
  Administered 2020-01-12: 50 ug via INTRAVENOUS

## 2020-01-12 MED ORDER — FENTANYL CITRATE (PF) 100 MCG/2ML IJ SOLN
INTRAMUSCULAR | Status: AC | PRN
  Administered 2020-01-12 (×3): 50 ug via INTRAVENOUS

## 2020-01-12 MED ORDER — NALOXONE HCL 0.4 MG/ML IJ SOLN
INTRAMUSCULAR | Status: AC
  Filled 2020-01-12: qty 1

## 2020-01-12 MED ORDER — FENTANYL CITRATE (PF) 100 MCG/2ML IJ SOLN
INTRAMUSCULAR | Status: AC
  Filled 2020-01-12: qty 2

## 2020-01-12 MED ORDER — MIDAZOLAM HCL 2 MG/2ML IJ SOLN
INTRAMUSCULAR | Status: AC | PRN
  Administered 2020-01-12: 1 mg via INTRAVENOUS

## 2020-01-12 MED ORDER — SODIUM CHLORIDE 0.9 % IV SOLN: INTRAVENOUS | Status: DC

## 2020-01-12 NOTE — Progress Notes (Signed)
CRITICAL VALUE ALERT  Critical Value:  WBC 88.1  Date & Time Notied:  01/12/2020  10:17  Provider Notified: Rowe Robert, PA  Orders Received/Actions taken: no orders given

## 2020-01-12 NOTE — Procedures (Signed)
Interventional Radiology Procedure Note  Procedure: CT guided core biopsy of left anterior iliac spine lesion   Complications: None  Estimated Blood Loss: None  Recommendations: - Bedrest x 1 hr - DC home   Signed,  Criselda Peaches, MD

## 2020-01-12 NOTE — H&P (Addendum)
Referring Physician(s): Feng,Yan  Supervising Physician: Jacqulynn Cadet  Patient Status:  [facility] OP  Chief Complaint: "I'm here for a bone biopsy"   Subjective: Patient familiar to IR service from image guided liver lesion biopsy on 01/01/2020 with negative pathology.  She did have quite a bit of discomfort following the biopsy with follow-up imaging revealing probable hematoma in the medial right hepatic  lobe.  She has a history of newly diagnosed right breast cancer.  She has completed 1 chemotherapy session.  She has a left chest wall Port-A-Cath in place.  She presents again today for image guided biopsy of the left iliac wing or right supra-acetabular lesion for further evaluation. She denies fever, headache, chest pain, dyspnea, cough, abdominal/back pain, nausea, vomiting or bleeding.  Additional medical history as below.   Past Medical History:  Diagnosis Date  . Cancer ([place]) 2021  . Hypertension    Past Surgical History:  Procedure Laterality Date  . left parotid tumor removal   2006  . PORTACATH PLACEMENT Left 12/15/2019   Procedure: INSERTION PORT-A-CATH WITH ULTRASOUND GUIDANCE;  Surgeon: [name], MD;  Location: [facility];  Service: General;  Laterality: Left;  . TONSILLECTOMY       Allergies: Patient has no known allergies.  Medications: Prior to Admission medications   Medication Sig Start Date End Date Taking? Authorizing Provider  acetaminophen (TYLENOL) 500 MG tablet Take 1,000 mg by mouth every 6 (six) hours as needed for moderate pain or headache.   Yes [provider]  amLODipine (NORVASC) 10 MG tablet Take 10 mg by mouth daily. 11/18/19  Yes [provider]  Cholecalciferol (VITAMIN D3) 50 MCG (2000 UT) TABS Take 2,000 Units by mouth daily.    Yes [provider]  metoprolol succinate (TOPROL-XL) 50 MG 24 hr tablet Take 50 mg by mouth daily. 11/18/19  Yes [provider]  omeprazole (PRILOSEC OTC) 20 MG tablet Take 20  mg by mouth daily.   Yes [provider]  potassium chloride SA (KLOR-CON) 20 MEQ tablet Take 1 tablet (20 mEq total) by mouth daily. 01/09/20  Yes Heilingoetter, Cassandra L, PA-C  dexamethasone (DECADRON) 4 MG tablet Take 2 tablets (8 mg total) by mouth 2 (two) times daily. Start the day before Taxotere. Then take daily x 3 days after chemotherapy. 12/03/19   Truitt Merle, MD  lidocaine-prilocaine (EMLA) cream Apply to affected area once 12/03/19   Truitt Merle, MD  ondansetron (ZOFRAN) 8 MG tablet Take 1 tablet (8 mg total) by mouth 2 (two) times daily as needed (Nausea or vomiting). Start on the third day after chemotherapy. 12/03/19   Truitt Merle, MD  oxyCODONE (OXY IR/ROXICODONE) 5 MG immediate release tablet Take 1 tablet (5 mg total) by mouth every 6 (six) hours as needed for severe pain. Patient not taking: Reported on 12/30/2019 12/15/19   [name], MD  prochlorperazine (COMPAZINE) 10 MG tablet Take 1 tablet (10 mg total) by mouth every 6 (six) hours as needed (Nausea or vomiting). 12/03/19   Truitt Merle, MD     Vital Signs: BP 131/77   Pulse 75   Temp 98.4 F (36.9 C) (Oral)   Resp 18   LMP 01/19/2013   SpO2 98%   Physical Exam awake, alert.  Chest clear to auscultation bilaterally.  Clean, intact left chest wall Port-A-Cath.  Heart with regular rate and rhythm.  Abdomen soft, positive bowel sounds, nontender.  No lower extremity edema.  Imaging: No results found.  Labs:  CBC: Recent Labs    12/09/19 1321 01/01/20 1120 01/02/20 1304 01/09/20 1446  WBC 6.1 9.7 11.5* 27.4*  HGB 13.2 13.1 10.0* 11.1*  HCT 42.4 42.4 30.9* 33.7*  PLT 353 345 286 301    COAGS: Recent Labs    01/01/20 1120  INR 1.0    BMP: Recent Labs    12/03/19 1226 12/09/19 1321 01/02/20 1304 01/09/20 1446  NA 142 141 136 140  K 3.9 3.3* 3.7 3.2*  CL 108 103 102 104  CO2 26 29 26 28   GLUCOSE 92 108* 103* 94  BUN 16 17 26* 18  CALCIUM 9.6 9.5 9.5 8.9  CREATININE 0.88 0.97 1.05* 0.84    GFRNONAA >60 >60 58* >60  GFRAA >60 >60 >60 >60    LIVER FUNCTION TESTS: Recent Labs    12/03/19 1226 01/02/20 1304 01/09/20 1446  BILITOT 0.3 0.7 0.8  AST 22 112* 26  ALT 23 135* 27  ALKPHOS 141* 202* 209*  PROT 7.6 7.1 7.1  ALBUMIN 3.9 3.5 3.7    Assessment and Plan: Patient familiar to IR service from image guided liver lesion biopsy on 01/01/2020 with negative pathology.  She did have quite a bit of discomfort following the biopsy with follow-up imaging revealing probable hematoma in the medial right hepatic  lobe.  She has a history of newly diagnosed right breast cancer.  She has completed 1 chemotherapy session.  She has a left chest wall Port-A-Cath in place.  She presents again today for image guided biopsy of the left iliac wing or right supra-acetabular lesion for further evaluation.Risks and benefits of procedure was discussed with the patient  including, but not limited to bleeding, infection, damage to adjacent structures or low yield requiring additional tests.  All of the questions were answered and there is agreement to proceed.  Consent signed and in chart.  Patient's WBC today 88.1.  She has recently received Neulasta.  She is afebrile.  Dr. Burr Medico aware.   Electronically Signed: D. Rowe Robert, PA-C 01/12/2020, 10:07 AM   I spent a total of 25 minutes at the the patient's bedside AND on the patient's hospital floor or unit, greater than 50% of which was counseling/coordinating care for image guided biopsy of left iliac wing versus right supra-acetabular lesion

## 2020-01-12 NOTE — Discharge Instructions (Addendum)
Please call Interventional Radiology clinic [phone] with any questions or concerns.  You may remove your dressing and shower tomorrow.   Needle Biopsy of the Bone, Care After This sheet gives you information about how to care for yourself after your procedure. Your health care provider may also give you more specific instructions. If you have problems or questions, contact your health care provider. What can I expect after the procedure? After the procedure, it is common to have soreness or tenderness at the puncture site. Follow these instructions at home: Puncture care   Follow instructions from your health care provider about how to take care of your puncture site. Make sure you: ? Wash your hands with soap and water before and after you change your bandage (dressing). If soap and water are not available, use hand sanitizer. ? Change your dressing as told by your health care provider.  Check your puncture site every day for signs of infection. Check for: ? Redness, swelling, or worsening pain. ? Fluid or blood. ? Warmth. ? Pus or a bad smell. General instructions  Take over-the-counter and prescription medicines only as told by your health care provider.  Do not drive for 24 hours if you were given a sedative during your procedure.  Return to your normal activities as told by your health care provider.  Keep all follow-up visits as told by your health care provider. This is important. Contact a health care provider if:  You have redness, swelling, or worsening pain at the site of your puncture.  You have fluid or blood coming from your puncture site.  Your puncture site feels warm to the touch.  You have pus or a bad smell coming from your puncture site.  You have a fever.  You have persistent nausea or vomiting. Get help right away if:  You develop a rash.  You have difficulty breathing. Summary  After the procedure, it is common to have soreness or  tenderness at the puncture site.  Follow instructions from your health care provider about how to take care of your puncture site.  Contact a health care provider if you have any signs of infection.  Keep all follow-up visits as told by your health care provider. This is important. This information is not intended to replace advice given to you by your health care provider. Make sure you discuss any questions you have with your health care provider. Document Revised: 12/20/2017 Document Reviewed: 12/20/2017 Elsevier Patient Education  2020 [employer] Inc[employer]    Moderate Conscious Sedation, Adult, Care After These instructions provide you with information about caring for yourself after your procedure. Your health care provider may also give you more specific instructions. Your treatment has been planned according to current medical practices, but problems sometimes occur. Call your health care provider if you have any problems or questions after your procedure. What can I expect after the procedure? After your procedure, it is common:  To feel sleepy for several hours.  To feel clumsy and have poor balance for several hours.  To have poor judgment for several hours.  To vomit if you eat too soon. Follow these instructions at home: For at least 24 hours after the procedure:   Do not: ? Participate in activities where you could fall or become injured. ? Drive. ? Use heavy machinery. ? Drink alcohol. ? Take sleeping pills or medicines that cause drowsiness. ? Make important decisions or sign legal documents. ? Take care of children on your own.    Rest. Eating and drinking  Follow the diet recommended by your health care provider.  If you vomit: ? Drink water, juice, or soup when you can drink without vomiting. ? Make sure you have little or no nausea before eating solid foods. General instructions  Have a responsible adult stay with you until you are awake and alert.  Take  over-the-counter and prescription medicines only as told by your health care provider.  If you smoke, do not smoke without supervision.  Keep all follow-up visits as told by your health care provider. This is important. Contact a health care provider if:  You keep feeling nauseous or you keep vomiting.  You feel light-headed.  You develop a rash.  You have a fever. Get help right away if:  You have trouble breathing. This information is not intended to replace advice given to you by your health care provider. Make sure you discuss any questions you have with your health care provider. Document Revised: 03/23/2017 Document Reviewed: 07/31/2015 Elsevier Patient Education  2020 Elsevier Inc.   

## 2020-01-15 ENCOUNTER — Telehealth: Payer: Self-pay | Admitting: *Deleted

## 2020-01-15 ENCOUNTER — Telehealth: Payer: Self-pay

## 2020-01-15 ENCOUNTER — Encounter: Payer: Self-pay | Admitting: *Deleted

## 2020-01-15 DIAGNOSIS — N812 Incomplete uterovaginal prolapse: Secondary | ICD-10-CM

## 2020-01-15 NOTE — Telephone Encounter (Signed)
I spoke with Carolyn Stewart.  The diarrhea has resolved.  She is eating and drinking without difficult.

## 2020-01-15 NOTE — Telephone Encounter (Signed)
Pt called with c/o vaginal prolapse. Went to ED and was recommended see gyn to assess.  Referral placed to Dr. [place] Pink.

## 2020-01-16 ENCOUNTER — Telehealth: Payer: Self-pay | Admitting: *Deleted

## 2020-01-16 ENCOUNTER — Encounter: Payer: Self-pay | Admitting: *Deleted

## 2020-01-16 NOTE — Telephone Encounter (Signed)
Referral made to Urology for vaginal prolapse. Was given 11/15 at 2:15 to see Dr. Rockey Situ. Pt notified of appt and confirmed date and time. Pt will be placed on a cancellation list to be seen earlier. Dr. Burr Medico notified of appt.

## 2020-01-21 LAB — SURGICAL PATHOLOGY

## 2020-01-21 NOTE — Progress Notes (Signed)
[facility]   [phone] [phone]   Clinic Follow up Note   Patient Care Team: Julian Hy, PA-C as PCP - General (Physician Assistant) [name], MD as Consulting Physician (General Surgery) Truitt Merle, MD as Consulting Physician (Hematology) Kyung Rudd, MD as Consulting Physician (Radiation Oncology) Mauro Kaufmann, RN as Oncology Nurse Navigator Rockwell Germany, RN as Oncology Nurse Navigator  Date of Service:  01/22/2020  CHIEF COMPLAINT: F/u of metastatic right breast cancer  SUMMARY OF ONCOLOGIC HISTORY: Oncology History Overview Note  Cancer Staging Malignant neoplasm of upper-outer quadrant of right breast in female, estrogen receptor positive ([place]) Staging form: Breast, AJCC 8th Edition - Clinical stage from 11/25/2019: Stage IIA (cT3, cN1, cM0, G2, ER+, PR+, HER2+) - Signed by Truitt Merle, MD on 12/03/2019    Malignant neoplasm of upper-outer quadrant of right breast in female, estrogen receptor positive ([place])  11/13/2019 Mammogram    Diagnostic Mammogram 11/13/19  IMPRESSION: -Highly suspicious mass and calcifications in the right breast centered between 9 and 12 o'clock spanning up to 11 cm.  -Multiple masses in the right axilla. One of the masses contains calcifications, denoted as mass number 2 measuring 9 x 11 by 10 mm. Several of these masses do not appear to represent definitive lymph nodes. Others may be small but abnormal appearing lymph nodes. Taken in total, a cluster of masses in the right axilla spans up to 3.7 cm.  -There are fibrocystic changes on the left. There are also some solid-appearing masses. A solid appearing mass at 10:30, 6 cm from the nipple measures 9 x 6 x 6 mm. An adjacent solid mass at 10 o'clock, 6 cm from the nipple measures 8 x 6 by 5 mm. Another solid-appearing mass at 11 o'clock, 1 cm from the nipple measures 5 x 4 by 5 mm. Fibrocystic changes are seen at 10 o'clock, 12 o'clock, and 4  o'clock. Another solid-appearing mass seen at 5 o'clock, 4 cm from the nipple measuring 6 x 3 x 4 mm. There is a single abnormal node in the left axilla with a cortex measuring 5 mm and restriction of the fatty hilum.   11/25/2019 Initial Biopsy   Diagnosis 1. Breast, right, needle core biopsy, upper outer - INVASIVE MAMMARY CARCINOMA - MAMMARY CARCINOMA IN SITU WITH CALCIFICATIONS AND NECROSIS - SEE COMMENT 2. Lymph node, needle/core biopsy, right axilla - INVASIVE MAMMARY CARCINOMA - SEE COMMENT 3. Lymph node, needle/core biopsy, left axilla - BENIGN LYMPH NODE - NO CARCINOMA IDENTIFIED Microscopic Comment 1. The biopsy material shows an infiltrative proliferation of cells arranged linearly and in small clusters. Based on the biopsy, the carcinoma appears Nottingham grade 2 of 3 and measures 1.2 cm in greatest linear extent. E-cadherin and prognostic markers (ER/PR/ki-67/HER2)are pending and will be reported in an addendum. Dr. Tresa Moore reviewed the case and agrees with the above diagnosis. These results were called to [facility] on November 26, 2019. 2. Definitive nodal tissue is not identified. This biopsy has a slightly different architecture morphology than what is seen in part 1. The carcinoma measures 0.7 cm in greatest dimension. E-cadherin and prognostic markers (ER, PR, Ki-67 and HER-2) are pending and will be reported in an addendum.   11/25/2019 Receptors her2   1. PROGNOSTIC INDICATORS Results: IMMUNOHISTOCHEMICAL AND MORPHOMETRIC ANALYSIS PERFORMED MANUALLY The tumor cells are POSITIVE for Her2 (3+). Estrogen Receptor: 90%, POSITIVE, STRONG STAINING INTENSITY Progesterone Receptor: 20%, POSITIVE, STRONG STAINING INTENSITY Proliferation Marker Ki67: 30%  2. PROGNOSTIC INDICATORS Results: IMMUNOHISTOCHEMICAL AND MORPHOMETRIC ANALYSIS PERFORMED MANUALLY The tumor cells are POSITIVE for Her2 (3+). Estrogen Receptor: 90%, POSITIVE, STRONG STAINING  INTENSITY Progesterone Receptor: 25%, POSITIVE, STRONG STAINING INTENSITY Proliferation Marker Ki67: 30%      11/25/2019 Cancer Staging   Staging form: Breast, AJCC 8th Edition - Clinical stage from 11/25/2019: Stage IIA (cT3, cN1, cM0, G2, ER+, PR+, HER2+) - Signed by Truitt Merle, MD on 12/03/2019   12/01/2019 Initial Diagnosis   Malignant neoplasm of upper-outer quadrant of right breast in female, estrogen receptor positive ([place])   12/10/2019 Imaging   Bone Scan  IMPRESSION: Findings concerning for bony metastatic disease in the left acetabulum region, L3 vertebral body region, and anterolateral right tenth rib.   Increased uptake in several large joints likely is of arthropathic etiology.   12/11/2019 Imaging   CT CAP w contrast  IMPRESSION: Prominent glandular tissue in the central right breast with nipple retraction, likely corresponding to the patient's known right breast neoplasm.   Prominent right axillary nodes measuring up to 8 mm short axis, suspicious for nodal metastases in this clinical context.   3 mm subpleural pulmonary nodule in the right lower lobe, likely benign. However, follow-up CT chest is suggested in 3-6 months.   Multiple hepatic lesions, including a dominant 2.4 cm lesion in segment 6, which is considered indeterminate. Metastasis is not excluded. Consider MRI abdomen with/without contrast for further characterization.   11 mm sclerotic lesion along the left posterior aspect of the T7 vertebral body raises concern for isolated osseous metastasis. Correlate with priors if available. Benign vertebral hemangioma at T11.   12/12/2019 Breast MRI   IMPRESSION: 1. The biopsy proven malignancy in the right breast involves all 4 quadrants and spans up to 9 cm by MRI. There is enhancement within the skin at the level of the nipple.   2. Multiple matted masses are seen in the right axilla, either abnormal metastatic lymph nodes or a combination of masses  and metastatic lymph nodes. One of these has been previously biopsied showing invasive mammary carcinoma without definitive nodal tissue.   3.  There is a 1.5 cm Rotter's lymph node on the right.   4. There is markedly abnormal enhancement and edema in the right pectoralis major muscle spanning 6-7 cm, suspicious for metastatic disease.   5. There is diffuse nodular enhancement throughout the left breast, which may correspond with the solid-appearing masses seen on ultrasound. These all have a similar appearance on MRI.   6.  No findings of left axillary lymphadenopathy.   12/15/2019 Procedure   PAC placed    12/24/2019 Imaging   MRI abdomen  IMPRESSION: 1. Multifocal enhancing bone lesions are noted within the lumbar spine, and bony pelvis compatible with metastatic disease. 2. Four, small peripherally enhancing cystic lesions are noted within the liver worrisome for metastatic disease. 3. 2 enhancing liver lesions are also noted with signal and enhancement characteristics consistent with benign liver hemangioma. 4. Well-circumscribed, enhancing T2 and T1 hypointense structure within the right adnexal region is indeterminate. It is unclear whether not this is arising from the right ovary, broad ligament or right side of uterine fundus. Differential considerations include fibrothecoma, versus pedunculated uterine leiomyoma. Metastatic disease is considered less favored. Attention on follow-up imaging is recommended.     01/03/2020 -  Chemotherapy   THP q3weeks starting 01/03/20    01/05/2020 Imaging   US Abdomen  IMPRESSION: 1. Suspect hematoma in the medial right lobe of the liver  in the area of previous biopsy. Note that recent MR does not show lesion in this area. This area has ill-defined margins and measures 3.9 x 4.0 x 3.5 cm. No perihepatic fluid.   2. Probable hemangiomas elsewhere in the right lobe of the liver. Note that several smaller lesions seen on recent MR  are not appreciable by ultrasound.   3.  Study otherwise unremarkable.   01/12/2020 Pathology Results   FINAL MICROSCOPIC DIAGNOSIS:   A. BONE, SCLEROTIC LESION, LEFT ANTERIOR ILIAC SPINE, BIOPSY:  - Metastatic carcinoma, consistent with patient's clinical history of  primary breast carcinoma  - See comment      COMMENT:   Immunohistochemical stains show that the tumor cells are positive for  CK7 and GATA3; and negative for CK20, consistent with above  interpretation.    PROGNOSTIC INDICATOR RESULTS:   Immunohistochemical and morphometric analysis performed manually   The tumor cells are NEGATIVE for Her2 (0); see comment   Estrogen Receptor: NEGATIVE; see comment  Progesterone Receptor: NEGATIVE; see comment       CURRENT THERAPY:  THPq3weeksstarting 01/03/20  INTERVAL HISTORY:  Wafaa Deemer is here for a follow up and treatment. She presents to the clinic alone. She has moderate diarrhea after first cycle chemo, it lasted one day.  Mild nausea, no vomiting.  She otherwise feels well with chemo.  Unfortunately she developed vaginal prolapse a few weeks ago, was seen in the ED, she is very concerned it may happen again.  She has been referred her to uro-GYN at [facility], but her appointment months later.  All other systems were reviewed with the patient and are negative.  MEDICAL HISTORY:  Past Medical History:  Diagnosis Date  . Cancer ([place]) 2021  . Hypertension     SURGICAL HISTORY: Past Surgical History:  Procedure Laterality Date  . left parotid tumor removal   2006  . PORTACATH PLACEMENT Left 12/15/2019   Procedure: INSERTION PORT-A-CATH WITH ULTRASOUND GUIDANCE;  Surgeon: [name], MD;  Location: [place];  Service: General;  Laterality: Left;  . TONSILLECTOMY      I have reviewed the social history and family history with the patient and they are unchanged from previous note.  ALLERGIES:  has No Known Allergies.  MEDICATIONS:   Current Outpatient Medications  Medication Sig Dispense Refill  . acetaminophen (TYLENOL) 500 MG tablet Take 1,000 mg by mouth every 6 (six) hours as needed for moderate pain or headache.    Marland Kitchen amLODipine (NORVASC) 10 MG tablet Take 10 mg by mouth daily.    . Cholecalciferol (VITAMIN D3) 50 MCG (2000 UT) TABS Take 2,000 Units by mouth daily.     Marland Kitchen dexamethasone (DECADRON) 4 MG tablet Take 2 tablets (8 mg total) by mouth 2 (two) times daily. Start the day before Taxotere. Then take daily x 3 days after chemotherapy. 30 tablet 1  . lidocaine-prilocaine (EMLA) cream Apply to affected area once 30 g 3  . metoprolol succinate (TOPROL-XL) 50 MG 24 hr tablet Take 50 mg by mouth daily.    Marland Kitchen omeprazole (PRILOSEC OTC) 20 MG tablet Take 20 mg by mouth daily.    . ondansetron (ZOFRAN) 8 MG tablet Take 1 tablet (8 mg total) by mouth 2 (two) times daily as needed (Nausea or vomiting). Start on the third day after chemotherapy. 30 tablet 1  . oxyCODONE (OXY IR/ROXICODONE) 5 MG immediate release tablet Take 1 tablet (5 mg total) by mouth every 6 (six) hours as needed for severe  pain. (Patient not taking: Reported on 12/30/2019) 10 tablet 0  . potassium chloride SA (KLOR-CON) 20 MEQ tablet Take 1 tablet (20 mEq total) by mouth daily. 6 tablet 0  . prochlorperazine (COMPAZINE) 10 MG tablet Take 1 tablet (10 mg total) by mouth every 6 (six) hours as needed (Nausea or vomiting). 30 tablet 1   No current facility-administered medications for this visit.    PHYSICAL EXAMINATION: ECOG PERFORMANCE STATUS: 1 - Symptomatic but completely ambulatory  Vitals:   01/22/20 0838  BP: (!) 141/79  Pulse: (!) 101  Resp: 18  Temp: 98.4 F (36.9 C)  SpO2: 96%   Filed Weights   01/22/20 0838  Weight: 192 lb 4.8 oz (87.2 kg)    GENERAL:alert, no distress and comfortable SKIN: skin color, texture, turgor are normal, no rashes or significant lesions EYES: normal, Conjunctiva are pink and non-injected, sclera clear NECK:  supple, thyroid normal size, non-tender, without nodularity LYMPH:  no palpable lymphadenopathy in the cervical, axillary  LUNGS: clear to auscultation and percussion with normal breathing effort HEART: regular rate & rhythm and no murmurs and no lower extremity edema ABDOMEN:abdomen soft, non-tender and normal bowel sounds Musculoskeletal:no cyanosis of digits and no clubbing  NEURO: alert & oriented x 3 with fluent speech, no focal motor/sensory deficits Breasts: Breast inspection showed them to be symmetrical with no nipple discharge. Palpation of the breasts and axilla revealed a 8.5X5cm mass in the right upper quadrant of right breast, no other obvious mass that I could appreciate.   LABORATORY DATA:  I have reviewed the data as listed CBC Latest Ref Rng & Units 01/22/2020 01/12/2020 01/09/2020  WBC 4.0 - 10.5 K/uL 15.5(H) 88.1(HH) 27.4(H)  Hemoglobin 12.0 - 15.0 g/dL 10.1(L) 11.5(L) 11.1(L)  Hematocrit 36 - 46 % 31.9(L) 36.0 33.7(L)  Platelets 150 - 400 K/uL 363 240 301     CMP Latest Ref Rng & Units 01/12/2020 01/09/2020 01/02/2020  Glucose 70 - 99 mg/dL 114(H) 94 103(H)  BUN 6 - 20 mg/dL 25(H) 18 26(H)  Creatinine 0.44 - 1.00 mg/dL 0.86 0.84 1.05(H)  Sodium 135 - 145 mmol/L 138 140 136  Potassium 3.5 - 5.1 mmol/L 4.0 3.2(L) 3.7  Chloride 98 - 111 mmol/L 105 104 102  CO2 22 - 32 mmol/L '25 28 26  ' Calcium 8.9 - 10.3 mg/dL 9.0 8.9 9.5  Total Protein 6.5 - 8.1 g/dL - 7.1 7.1  Total Bilirubin 0.3 - 1.2 mg/dL - 0.8 0.7  Alkaline Phos 38 - 126 U/L - 209(H) 202(H)  AST 15 - 41 U/L - 26 112(H)  ALT 0 - 44 U/L - 27 135(H)      RADIOGRAPHIC STUDIES: I have personally reviewed the radiological images as listed and agreed with the findings in the report. No results found.   ASSESSMENT & PLAN:  Carolyn Stewart is a 59 y.o. female with    1.Malignant neoplasm of upper-outer quadrant of right breast,ductal carcinoma,StageIIA, c(T3N1Mx),with bone and possible liver  mets,ER+/PR+/HER2+,GradeII-III -She was diagnosed in 11/2019 witha largeright breast mass in 4 quadrants measuringup to 11cm and right lymphadenopathy as well as indeterminate masses in left breast and one enlarged left axillar LN. Her right breast and LN biopsy was positive for invasiveductalcarcinoma with components of DCIS, calcifications and necrosis in the breast. Her left axillarynodebiopsy was benign. -Given liver lesions and bone lesions (left L3, right 10th rib, left pelvis) concerning for metastatic disease she underwent liver biopsy on 01/01/20. Liver biopsy was negative for malignant cells but her 01/12/20  Bone Biopsy was positive for metastatic carcinoma from primary breast cancer. I reviewed with pt today. Her ER/PR/HER2 were all negative bone biopsy, possibly related to decalcification. -We discussed that that she has stage IV metastatic breast cancer, it is not curable but very treatable, with multiple new HER-2 antibodies available now.  I discussed the nature course of metastatic breast cancer, she voiced good understanding. -We do not plan to have right breast and axillary surgery in the near future, chemotherapy, surgery may be an option down the road.  -She was also seen to have left breast mass on her 12/12/19 MRI, but given metastatic disease, no furhter biopsy is needed due to her metastatic disease.  -I started her on chemo with THP every 3 weeks on 01/03/20. She tolerated first cycle well -Lab reviewed, adequate for treatment, will proceed to 2nd cycle THP today  -plan to repeat staging scan after 5-6 cycles chemo   2. Diarrhea, Constipation, and vaginal prolaps -The patient has been experiencing constipation following her first cycle of THP.  With 2 bottles of MiraLAX on 01/08/20 she ended up causing significant diarrhea. -Her 01/09/20 ED visit indicated diarrhea as well as vaginal prolapse. The patient's diarrhea has since resolved.  -Constipation education was given to  the patient. If she needs to use a laxative in the future, encouraged her to try to use half a bottle initially. -She will follow up with her OB/GYN regarding the vaginal prolapse. I have made an urgent referral to Dr. Rip Harbour for next week 10/6, I gave her the appointment information today    2. HTN -Well controlled on Metoprolol and Amlodipine. Continue to f/u with PCP   3. Intrahepatic bleeding after biopsy -confirmed on [place] -pain has resolved now   4. Goal of care discussion  -We again discussed the incurable nature of her cancer, and the overall poor prognosis, especially if she does not have good response to chemotherapy or progress on chemo -The patient understands the goal of care is palliative. -she is full code now     PLAN: -we discussed her bone biopsy results  -urgent referral to GYN Dr. Rip Harbour next week 10/6, appointment info was given to pt today  -Lab reviewed, adequate for treatment, will proceed with 2nd cycle of docetaxel, trastuzumab and Perjeta today -Follow-up in 3 weeks for cycle 3    No problem-specific Assessment & Plan notes found for this encounter.   No orders of the defined types were placed in this encounter.  All questions were answered. The patient knows to call the clinic with any problems, questions or concerns. No barriers to learning was detected. The total time spent in the appointment was 40 minutes.     Truitt Merle, MD 01/22/2020   I, Joslyn Devon, am acting as scribe for Truitt Merle, MD.   I have reviewed the above documentation for accuracy and completeness, and I agree with the above.

## 2020-01-22 ENCOUNTER — Inpatient Hospital Stay: Payer: Commercial Managed Care - PPO

## 2020-01-22 ENCOUNTER — Encounter: Payer: Self-pay | Admitting: Hematology

## 2020-01-22 ENCOUNTER — Inpatient Hospital Stay (HOSPITAL_BASED_OUTPATIENT_CLINIC_OR_DEPARTMENT_OTHER): Payer: Commercial Managed Care - PPO | Admitting: Hematology

## 2020-01-22 ENCOUNTER — Other Ambulatory Visit: Payer: Self-pay

## 2020-01-22 ENCOUNTER — Telehealth: Payer: Self-pay | Admitting: *Deleted

## 2020-01-22 VITALS — BP 141/79 | HR 101 | Temp 98.4°F | Resp 18 | Ht 67.0 in | Wt 192.3 lb

## 2020-01-22 VITALS — BP 126/80 | HR 89

## 2020-01-22 DIAGNOSIS — C50411 Malignant neoplasm of upper-outer quadrant of right female breast: Secondary | ICD-10-CM

## 2020-01-22 DIAGNOSIS — Z170 Estrogen receptor positive status [ER+]: Secondary | ICD-10-CM

## 2020-01-22 DIAGNOSIS — Z5112 Encounter for antineoplastic immunotherapy: Secondary | ICD-10-CM | POA: Diagnosis not present

## 2020-01-22 LAB — CBC WITH DIFFERENTIAL (CANCER CENTER ONLY)
Abs Immature Granulocytes: 0.13 10*3/uL — ABNORMAL HIGH (ref 0.00–0.07)
Basophils Absolute: 0 10*3/uL (ref 0.0–0.1)
Basophils Relative: 0 %
Eosinophils Absolute: 0 10*3/uL (ref 0.0–0.5)
Eosinophils Relative: 0 %
HCT: 31.9 % — ABNORMAL LOW (ref 36.0–46.0)
Hemoglobin: 10.1 g/dL — ABNORMAL LOW (ref 12.0–15.0)
Immature Granulocytes: 1 %
Lymphocytes Relative: 8 %
Lymphs Abs: 1.2 10*3/uL (ref 0.7–4.0)
MCH: 30 pg (ref 26.0–34.0)
MCHC: 31.7 g/dL (ref 30.0–36.0)
MCV: 94.7 fL (ref 80.0–100.0)
Monocytes Absolute: 0.3 10*3/uL (ref 0.1–1.0)
Monocytes Relative: 2 %
Neutro Abs: 13.8 10*3/uL — ABNORMAL HIGH (ref 1.7–7.7)
Neutrophils Relative %: 89 %
Platelet Count: 363 10*3/uL (ref 150–400)
RBC: 3.37 MIL/uL — ABNORMAL LOW (ref 3.87–5.11)
RDW: 16.7 % — ABNORMAL HIGH (ref 11.5–15.5)
WBC Count: 15.5 10*3/uL — ABNORMAL HIGH (ref 4.0–10.5)
nRBC: 0 % (ref 0.0–0.2)

## 2020-01-22 LAB — CMP (CANCER CENTER ONLY)
ALT: 17 U/L (ref 0–44)
AST: 11 U/L — ABNORMAL LOW (ref 15–41)
Albumin: 3 g/dL — ABNORMAL LOW (ref 3.5–5.0)
Alkaline Phosphatase: 125 U/L (ref 38–126)
Anion gap: 8 (ref 5–15)
BUN: 18 mg/dL (ref 6–20)
CO2: 23 mmol/L (ref 22–32)
Calcium: 8.6 mg/dL — ABNORMAL LOW (ref 8.9–10.3)
Chloride: 110 mmol/L (ref 98–111)
Creatinine: 0.84 mg/dL (ref 0.44–1.00)
GFR, Est AFR Am: >60 mL/min (ref >60)
GFR, Estimated: >60 mL/min (ref >60)
Glucose, Bld: 168 mg/dL — ABNORMAL HIGH (ref 70–99)
Potassium: 3.8 mmol/L (ref 3.5–5.1)
Sodium: 141 mmol/L (ref 135–145)
Total Bilirubin: 0.3 mg/dL (ref 0.3–1.2)
Total Protein: 6.4 g/dL — ABNORMAL LOW (ref 6.5–8.1)

## 2020-01-22 MED ORDER — TRASTUZUMAB-ANNS CHEMO 150 MG IV SOLR
6.0000 mg/kg | Freq: Once | INTRAVENOUS | Status: AC
  Administered 2020-01-22: 525 mg via INTRAVENOUS
  Filled 2020-01-22: qty 25

## 2020-01-22 MED ORDER — SODIUM CHLORIDE 0.9% FLUSH
10.0000 mL | Freq: Once | INTRAVENOUS | Status: AC
  Administered 2020-01-22: 10 mL
  Filled 2020-01-22: qty 10

## 2020-01-22 MED ORDER — ACETAMINOPHEN 325 MG PO TABS
ORAL_TABLET | ORAL | Status: AC
  Filled 2020-01-22: qty 2

## 2020-01-22 MED ORDER — PALONOSETRON HCL INJECTION 0.25 MG/5ML
0.2500 mg | Freq: Once | INTRAVENOUS | Status: AC
  Administered 2020-01-22: 0.25 mg via INTRAVENOUS

## 2020-01-22 MED ORDER — DIPHENHYDRAMINE HCL 25 MG PO CAPS
50.0000 mg | ORAL_CAPSULE | Freq: Once | ORAL | Status: AC
  Administered 2020-01-22: 50 mg via ORAL

## 2020-01-22 MED ORDER — SODIUM CHLORIDE 0.9% FLUSH
10.0000 mL | INTRAVENOUS | Status: DC | PRN
  Administered 2020-01-22: 10 mL
  Filled 2020-01-22: qty 10

## 2020-01-22 MED ORDER — HEPARIN SOD (PORK) LOCK FLUSH 100 UNIT/ML IV SOLN
500.0000 [IU] | Freq: Once | INTRAVENOUS | Status: AC | PRN
  Administered 2020-01-22: 500 [IU]
  Filled 2020-01-22: qty 5

## 2020-01-22 MED ORDER — DIPHENHYDRAMINE HCL 25 MG PO CAPS
ORAL_CAPSULE | ORAL | Status: AC
  Filled 2020-01-22: qty 2

## 2020-01-22 MED ORDER — SODIUM CHLORIDE 0.9 % IV SOLN
Freq: Once | INTRAVENOUS | Status: AC
  Filled 2020-01-22: qty 250

## 2020-01-22 MED ORDER — SODIUM CHLORIDE 0.9 % IV SOLN
420.0000 mg | Freq: Once | INTRAVENOUS | Status: AC
  Administered 2020-01-22: 420 mg via INTRAVENOUS
  Filled 2020-01-22: qty 14

## 2020-01-22 MED ORDER — SODIUM CHLORIDE 0.9 % IV SOLN
10.0000 mg | Freq: Once | INTRAVENOUS | Status: AC
  Administered 2020-01-22: 10 mg via INTRAVENOUS
  Filled 2020-01-22: qty 10

## 2020-01-22 MED ORDER — ACETAMINOPHEN 325 MG PO TABS
650.0000 mg | ORAL_TABLET | Freq: Once | ORAL | Status: AC
  Administered 2020-01-22: 650 mg via ORAL

## 2020-01-22 MED ORDER — SODIUM CHLORIDE 0.9 % IV SOLN
75.0000 mg/m2 | Freq: Once | INTRAVENOUS | Status: AC
  Administered 2020-01-22: 140 mg via INTRAVENOUS
  Filled 2020-01-22: qty 14

## 2020-01-22 MED ORDER — PALONOSETRON HCL INJECTION 0.25 MG/5ML
INTRAVENOUS | Status: AC
  Filled 2020-01-22: qty 5

## 2020-01-22 NOTE — Patient Instructions (Signed)
[facility] Discharge Instructions for Patients Receiving Chemotherapy  Today you received the following chemotherapy agents: trastuzumab, pertuzumab, and docetaxel.  To help prevent nausea and vomiting after your treatment, we encourage you to take your nausea medication as directed.   If you develop nausea and vomiting that is not controlled by your nausea medication, call the clinic.   BELOW ARE SYMPTOMS THAT SHOULD BE REPORTED IMMEDIATELY:  *FEVER GREATER THAN 100.5 F  *CHILLS WITH OR WITHOUT FEVER  NAUSEA AND VOMITING THAT IS NOT CONTROLLED WITH YOUR NAUSEA MEDICATION  *UNUSUAL SHORTNESS OF BREATH  *UNUSUAL BRUISING OR BLEEDING  TENDERNESS IN MOUTH AND THROAT WITH OR WITHOUT PRESENCE OF ULCERS  *URINARY PROBLEMS  *BOWEL PROBLEMS  UNUSUAL RASH Items with * indicate a potential emergency and should be followed up as soon as possible.  Feel free to call the clinic should you have any questions or concerns. The clinic phone number is [phone].  Please show the CHEMO ALERT CARD at check-in to the Emergency Department and triage nurse.   

## 2020-01-22 NOTE — Telephone Encounter (Signed)
  Oncology Nurse Navigator Documentation  Navigator Location: [facility] (01/22/20 1500)   )Navigator Encounter Type: Appt/Treatment Plan Review (01/22/20 1500)                     Patient Visit Type: MedOnc (01/22/20 1500) Treatment Phase: Treatment (01/22/20 1500)                            Time Spent with Patient: 15 (01/22/20 1500)

## 2020-01-22 NOTE — Patient Instructions (Signed)

## 2020-01-23 ENCOUNTER — Encounter: Payer: Self-pay | Admitting: *Deleted

## 2020-01-23 ENCOUNTER — Telehealth: Payer: Self-pay | Admitting: Hematology

## 2020-01-23 NOTE — Telephone Encounter (Signed)
Scheduled per 09/30 los, patient has been called and notified.

## 2020-01-24 ENCOUNTER — Inpatient Hospital Stay: Payer: Commercial Managed Care - PPO

## 2020-01-28 ENCOUNTER — Ambulatory Visit: Payer: Commercial Managed Care - PPO | Admitting: Hematology

## 2020-01-28 ENCOUNTER — Encounter: Payer: Self-pay | Admitting: Obstetrics and Gynecology

## 2020-01-28 ENCOUNTER — Other Ambulatory Visit: Payer: Commercial Managed Care - PPO

## 2020-01-28 ENCOUNTER — Other Ambulatory Visit (HOSPITAL_COMMUNITY)
Admission: RE | Admit: 2020-01-28 | Discharge: 2020-01-28 | Disposition: A | Discharge status: Home / Self Care | Payer: Commercial Managed Care - PPO | Source: Ambulatory Visit | Attending: Obstetrics and Gynecology | Admitting: Obstetrics and Gynecology

## 2020-01-28 ENCOUNTER — Ambulatory Visit: Payer: Commercial Managed Care - PPO

## 2020-01-28 ENCOUNTER — Ambulatory Visit (INDEPENDENT_AMBULATORY_CARE_PROVIDER_SITE_OTHER): Payer: Commercial Managed Care - PPO | Admitting: Obstetrics and Gynecology

## 2020-01-28 ENCOUNTER — Other Ambulatory Visit: Payer: Self-pay

## 2020-01-28 VITALS — BP 131/83 | HR 108 | Ht 67.0 in | Wt 192.3 lb

## 2020-01-28 DIAGNOSIS — Z01419 Encounter for gynecological examination (general) (routine) without abnormal findings: Secondary | ICD-10-CM | POA: Insufficient documentation

## 2020-01-29 LAB — CYTOLOGY - PAP
Comment: NEGATIVE
Diagnosis: NEGATIVE
High risk HPV: NEGATIVE

## 2020-01-30 ENCOUNTER — Ambulatory Visit: Payer: Commercial Managed Care - PPO

## 2020-01-30 NOTE — Progress Notes (Signed)
Carolyn Stewart presents in referral from Dr Burr Medico for eval of pelvic prolapse. Pt recently Dx with metastatic breast cancer this past July.  She has completed 2 rounds of chemotherapy.  She had some problems with diarrhea after round one and was found to have what sounds like uterine prolapse. Dx in ER. Uterus was replaced and she has not experienced any further prolapse.  She is sexual active without problems but has not since episode of prolapse. She denies any problems voiding, control or excessive urination. She denies any problems with constipation  TSVD x 3. No operative deliveries.  PMH as noted in medical records  PE AF VSS Lungs clear Heart RRR Abd soft + BS GU nl EGBUS, cervix no lesion, pap smear obtained, uterus small < 8 weeks, small mobile non tender  Perineal body @ 4 cm, uterine sacral ligaments attenuated bilaterally, Q tip < 30 degrees, small cystocele and rectocele with valsalva in the supine position,  Uterine prolapse to hymenal ring with valsalva in the supine position  A/P Pelvic relaxation  Reviewed Dx with pt. Suspect that has always had some degree of relaxation but Sx aggravated by episode of pt's diarrhea brought on by chemotherapy. Tx options reviewed with pt including, physical therapy, pessary and surgery. Pt is undecided at this time but is leaning toward pessary until chemotherapy has been complete and then proceed to surgery. I feel this is a very reasonable plan. Discussed TVH with BSO, R/B/Post care reviewed. I do not think she is a candidate at this time for any type of posterior or anterior repair as she is without Sx. I did explain to pt that she might need some type of additional pelvic reconstruction in the future. Pt verbalized understanding. She wants to discuss with her husband and make a follow up  appt for pessary fitting.

## 2020-02-11 ENCOUNTER — Institutional Professional Consult (permissible substitution): Payer: Commercial Managed Care - PPO | Admitting: Plastic Surgery

## 2020-02-12 ENCOUNTER — Inpatient Hospital Stay (HOSPITAL_BASED_OUTPATIENT_CLINIC_OR_DEPARTMENT_OTHER): Payer: Commercial Managed Care - PPO | Admitting: Hematology

## 2020-02-12 ENCOUNTER — Inpatient Hospital Stay: Payer: Commercial Managed Care - PPO

## 2020-02-12 ENCOUNTER — Inpatient Hospital Stay: Payer: Commercial Managed Care - PPO | Attending: Hematology

## 2020-02-12 ENCOUNTER — Encounter: Payer: Self-pay | Admitting: Hematology

## 2020-02-12 ENCOUNTER — Ambulatory Visit (HOSPITAL_COMMUNITY)
Admission: RE | Admit: 2020-02-12 | Discharge: 2020-02-12 | Disposition: A | Discharge status: Home / Self Care | Payer: Commercial Managed Care - PPO | Source: Ambulatory Visit | Attending: Hematology | Admitting: Hematology

## 2020-02-12 ENCOUNTER — Encounter: Payer: Self-pay | Admitting: *Deleted

## 2020-02-12 ENCOUNTER — Other Ambulatory Visit: Payer: Self-pay

## 2020-02-12 VITALS — BP 132/74 | HR 89 | Temp 98.6°F | Resp 17 | Ht 67.0 in | Wt 192.4 lb

## 2020-02-12 DIAGNOSIS — C50411 Malignant neoplasm of upper-outer quadrant of right female breast: Secondary | ICD-10-CM

## 2020-02-12 DIAGNOSIS — Z79899 Other long term (current) drug therapy: Secondary | ICD-10-CM | POA: Diagnosis not present

## 2020-02-12 DIAGNOSIS — Z7952 Long term (current) use of systemic steroids: Secondary | ICD-10-CM | POA: Insufficient documentation

## 2020-02-12 DIAGNOSIS — C7951 Secondary malignant neoplasm of bone: Secondary | ICD-10-CM | POA: Insufficient documentation

## 2020-02-12 DIAGNOSIS — M2062 Acquired deformities of toe(s), unspecified, left foot: Secondary | ICD-10-CM | POA: Diagnosis not present

## 2020-02-12 DIAGNOSIS — N8110 Cystocele, unspecified: Secondary | ICD-10-CM | POA: Insufficient documentation

## 2020-02-12 DIAGNOSIS — Z170 Estrogen receptor positive status [ER+]: Secondary | ICD-10-CM | POA: Diagnosis not present

## 2020-02-12 DIAGNOSIS — Z5111 Encounter for antineoplastic chemotherapy: Secondary | ICD-10-CM | POA: Diagnosis present

## 2020-02-12 DIAGNOSIS — E876 Hypokalemia: Secondary | ICD-10-CM

## 2020-02-12 DIAGNOSIS — K5900 Constipation, unspecified: Secondary | ICD-10-CM | POA: Diagnosis not present

## 2020-02-12 DIAGNOSIS — I10 Essential (primary) hypertension: Secondary | ICD-10-CM | POA: Diagnosis not present

## 2020-02-12 DIAGNOSIS — Z5112 Encounter for antineoplastic immunotherapy: Secondary | ICD-10-CM | POA: Diagnosis present

## 2020-02-12 LAB — CBC WITH DIFFERENTIAL (CANCER CENTER ONLY)
Abs Immature Granulocytes: 0.03 10*3/uL (ref 0.00–0.07)
Basophils Absolute: 0 10*3/uL (ref 0.0–0.1)
Basophils Relative: 0 %
Eosinophils Absolute: 0.1 10*3/uL (ref 0.0–0.5)
Eosinophils Relative: 1 %
HCT: 32.8 % — ABNORMAL LOW (ref 36.0–46.0)
Hemoglobin: 10.4 g/dL — ABNORMAL LOW (ref 12.0–15.0)
Immature Granulocytes: 0 %
Lymphocytes Relative: 30 %
Lymphs Abs: 2.5 10*3/uL (ref 0.7–4.0)
MCH: 30.1 pg (ref 26.0–34.0)
MCHC: 31.7 g/dL (ref 30.0–36.0)
MCV: 94.8 fL (ref 80.0–100.0)
Monocytes Absolute: 1 10*3/uL (ref 0.1–1.0)
Monocytes Relative: 12 %
Neutro Abs: 4.7 10*3/uL (ref 1.7–7.7)
Neutrophils Relative %: 57 %
Platelet Count: 342 10*3/uL (ref 150–400)
RBC: 3.46 MIL/uL — ABNORMAL LOW (ref 3.87–5.11)
RDW: 15.9 % — ABNORMAL HIGH (ref 11.5–15.5)
WBC Count: 8.3 10*3/uL (ref 4.0–10.5)
nRBC: 0 % (ref 0.0–0.2)

## 2020-02-12 LAB — CMP (CANCER CENTER ONLY)
ALT: 9 U/L (ref 0–44)
AST: 14 U/L — ABNORMAL LOW (ref 15–41)
Albumin: 3.3 g/dL — ABNORMAL LOW (ref 3.5–5.0)
Alkaline Phosphatase: 126 U/L (ref 38–126)
Anion gap: 6 (ref 5–15)
BUN: 11 mg/dL (ref 6–20)
CO2: 27 mmol/L (ref 22–32)
Calcium: 9.4 mg/dL (ref 8.9–10.3)
Chloride: 110 mmol/L (ref 98–111)
Creatinine: 0.84 mg/dL (ref 0.44–1.00)
GFR, Estimated: >60 mL/min (ref >60)
Glucose, Bld: 103 mg/dL — ABNORMAL HIGH (ref 70–99)
Potassium: 3.3 mmol/L — ABNORMAL LOW (ref 3.5–5.1)
Sodium: 143 mmol/L (ref 135–145)
Total Bilirubin: 0.2 mg/dL — ABNORMAL LOW (ref 0.3–1.2)
Total Protein: 6.4 g/dL — ABNORMAL LOW (ref 6.5–8.1)

## 2020-02-12 MED ORDER — HEPARIN SOD (PORK) LOCK FLUSH 100 UNIT/ML IV SOLN
500.0000 [IU] | Freq: Once | INTRAVENOUS | Status: AC | PRN
  Administered 2020-02-12: 500 [IU]
  Filled 2020-02-12: qty 5

## 2020-02-12 MED ORDER — SODIUM CHLORIDE 0.9 % IV SOLN
Freq: Once | INTRAVENOUS | Status: AC
  Filled 2020-02-12: qty 250

## 2020-02-12 MED ORDER — SODIUM CHLORIDE 0.9% FLUSH
10.0000 mL | Freq: Once | INTRAVENOUS | Status: AC
  Administered 2020-02-12: 10 mL
  Filled 2020-02-12: qty 10

## 2020-02-12 MED ORDER — SODIUM CHLORIDE 0.9 % IV SOLN
10.0000 mg | Freq: Once | INTRAVENOUS | Status: AC
  Administered 2020-02-12: 10 mg via INTRAVENOUS
  Filled 2020-02-12: qty 10

## 2020-02-12 MED ORDER — ACETAMINOPHEN 325 MG PO TABS
650.0000 mg | ORAL_TABLET | Freq: Once | ORAL | Status: AC
  Administered 2020-02-12: 650 mg via ORAL

## 2020-02-12 MED ORDER — POTASSIUM CHLORIDE CRYS ER 20 MEQ PO TBCR: 20.0000 meq | EXTENDED_RELEASE_TABLET | Freq: Every day | ORAL | 0 refills | Status: DC

## 2020-02-12 MED ORDER — SODIUM CHLORIDE 0.9% FLUSH
10.0000 mL | INTRAVENOUS | Status: DC | PRN
  Administered 2020-02-12: 10 mL
  Filled 2020-02-12: qty 10

## 2020-02-12 MED ORDER — DIPHENHYDRAMINE HCL 25 MG PO CAPS
50.0000 mg | ORAL_CAPSULE | Freq: Once | ORAL | Status: AC
  Administered 2020-02-12: 50 mg via ORAL

## 2020-02-12 MED ORDER — PALONOSETRON HCL INJECTION 0.25 MG/5ML
INTRAVENOUS | Status: AC
  Filled 2020-02-12: qty 5

## 2020-02-12 MED ORDER — PALONOSETRON HCL INJECTION 0.25 MG/5ML
0.2500 mg | Freq: Once | INTRAVENOUS | Status: AC
  Administered 2020-02-12: 0.25 mg via INTRAVENOUS

## 2020-02-12 MED ORDER — ACETAMINOPHEN 325 MG PO TABS
ORAL_TABLET | ORAL | Status: AC
  Filled 2020-02-12: qty 2

## 2020-02-12 MED ORDER — SODIUM CHLORIDE 0.9 % IV SOLN
75.0000 mg/m2 | Freq: Once | INTRAVENOUS | Status: AC
  Administered 2020-02-12: 140 mg via INTRAVENOUS
  Filled 2020-02-12: qty 14

## 2020-02-12 MED ORDER — DIPHENHYDRAMINE HCL 25 MG PO CAPS
ORAL_CAPSULE | ORAL | Status: AC
  Filled 2020-02-12: qty 2

## 2020-02-12 MED ORDER — SODIUM CHLORIDE 0.9 % IV SOLN
420.0000 mg | Freq: Once | INTRAVENOUS | Status: AC
  Administered 2020-02-12: 420 mg via INTRAVENOUS
  Filled 2020-02-12: qty 14

## 2020-02-12 MED ORDER — TRASTUZUMAB-ANNS CHEMO 150 MG IV SOLR
6.0000 mg/kg | Freq: Once | INTRAVENOUS | Status: AC
  Administered 2020-02-12: 525 mg via INTRAVENOUS
  Filled 2020-02-12: qty 25

## 2020-02-12 NOTE — Progress Notes (Signed)
[place]   [phone] [phone]   Clinic Follow up Note   Patient Care Team: Julian Hy, PA-C as PCP - General (Physician Assistant) [name], MD as Consulting Physician (General Surgery) Truitt Merle, MD as Consulting Physician (Hematology) Kyung Rudd, MD as Consulting Physician (Radiation Oncology) Mauro Kaufmann, RN as Oncology Nurse Navigator Rockwell Germany, RN as Oncology Nurse Navigator  Date of Service:  02/12/2020  CHIEF COMPLAINT: F/u of metastatic right breast cancer  SUMMARY OF ONCOLOGIC HISTORY: Oncology History Overview Note  Cancer Staging Malignant neoplasm of upper-outer quadrant of right breast in female, estrogen receptor positive ([place]) Staging form: Breast, AJCC 8th Edition - Clinical stage from 11/25/2019: Stage IIA (cT3, cN1, cM0, G2, ER+, PR+, HER2+) - Signed by Truitt Merle, MD on 12/03/2019    Malignant neoplasm of upper-outer quadrant of right breast in female, estrogen receptor positive ([place])  11/13/2019 Mammogram    Diagnostic Mammogram 11/13/19  IMPRESSION: -Highly suspicious mass and calcifications in the right breast centered between 9 and 12 o'clock spanning up to 11 cm.  -Multiple masses in the right axilla. One of the masses contains calcifications, denoted as mass number 2 measuring 9 x 11 by 10 mm. Several of these masses do not appear to represent definitive lymph nodes. Others may be small but abnormal appearing lymph nodes. Taken in total, a cluster of masses in the right axilla spans up to 3.7 cm.  -There are fibrocystic changes on the left. There are also some solid-appearing masses. A solid appearing mass at 10:30, 6 cm from the nipple measures 9 x 6 x 6 mm. An adjacent solid mass at 10 o'clock, 6 cm from the nipple measures 8 x 6 by 5 mm. Another solid-appearing mass at 11 o'clock, 1 cm from the nipple measures 5 x 4 by 5 mm. Fibrocystic changes are seen at 10 o'clock, 12 o'clock, and 4  o'clock. Another solid-appearing mass seen at 5 o'clock, 4 cm from the nipple measuring 6 x 3 x 4 mm. There is a single abnormal node in the left axilla with a cortex measuring 5 mm and restriction of the fatty hilum.   11/25/2019 Initial Biopsy   Diagnosis 1. Breast, right, needle core biopsy, upper outer - INVASIVE MAMMARY CARCINOMA - MAMMARY CARCINOMA IN SITU WITH CALCIFICATIONS AND NECROSIS - SEE COMMENT 2. Lymph node, needle/core biopsy, right axilla - INVASIVE MAMMARY CARCINOMA - SEE COMMENT 3. Lymph node, needle/core biopsy, left axilla - BENIGN LYMPH NODE - NO CARCINOMA IDENTIFIED Microscopic Comment 1. The biopsy material shows an infiltrative proliferation of cells arranged linearly and in small clusters. Based on the biopsy, the carcinoma appears Nottingham grade 2 of 3 and measures 1.2 cm in greatest linear extent. E-cadherin and prognostic markers (ER/PR/ki-67/HER2)are pending and will be reported in an addendum. Dr. Tresa Moore reviewed the case and agrees with the above diagnosis. These results were called to [facility] on November 26, 2019. 2. Definitive nodal tissue is not identified. This biopsy has a slightly different architecture morphology than what is seen in part 1. The carcinoma measures 0.7 cm in greatest dimension. E-cadherin and prognostic markers (ER, PR, Ki-67 and HER-2) are pending and will be reported in an addendum.   11/25/2019 Receptors her2   1. PROGNOSTIC INDICATORS Results: IMMUNOHISTOCHEMICAL AND MORPHOMETRIC ANALYSIS PERFORMED MANUALLY The tumor cells are POSITIVE for Her2 (3+). Estrogen Receptor: 90%, POSITIVE, STRONG STAINING INTENSITY Progesterone Receptor: 20%, POSITIVE, STRONG STAINING INTENSITY Proliferation Marker Ki67: 30%  2. PROGNOSTIC INDICATORS Results: IMMUNOHISTOCHEMICAL AND MORPHOMETRIC ANALYSIS PERFORMED MANUALLY The tumor cells are POSITIVE for Her2 (3+). Estrogen Receptor: 90%, POSITIVE, STRONG STAINING  INTENSITY Progesterone Receptor: 25%, POSITIVE, STRONG STAINING INTENSITY Proliferation Marker Ki67: 30%      11/25/2019 Cancer Staging   Staging form: Breast, AJCC 8th Edition - Clinical stage from 11/25/2019: Stage IIA (cT3, cN1, cM0, G2, ER+, PR+, HER2+) - Signed by Truitt Merle, MD on 12/03/2019   12/01/2019 Initial Diagnosis   Malignant neoplasm of upper-outer quadrant of right breast in female, estrogen receptor positive ([place])   12/10/2019 Imaging   Bone Scan  IMPRESSION: Findings concerning for bony metastatic disease in the left acetabulum region, L3 vertebral body region, and anterolateral right tenth rib.   Increased uptake in several large joints likely is of arthropathic etiology.   12/11/2019 Imaging   CT CAP w contrast  IMPRESSION: Prominent glandular tissue in the central right breast with nipple retraction, likely corresponding to the patient's known right breast neoplasm.   Prominent right axillary nodes measuring up to 8 mm short axis, suspicious for nodal metastases in this clinical context.   3 mm subpleural pulmonary nodule in the right lower lobe, likely benign. However, follow-up CT chest is suggested in 3-6 months.   Multiple hepatic lesions, including a dominant 2.4 cm lesion in segment 6, which is considered indeterminate. Metastasis is not excluded. Consider MRI abdomen with/without contrast for further characterization.   11 mm sclerotic lesion along the left posterior aspect of the T7 vertebral body raises concern for isolated osseous metastasis. Correlate with priors if available. Benign vertebral hemangioma at T11.   12/12/2019 Breast MRI   IMPRESSION: 1. The biopsy proven malignancy in the right breast involves all 4 quadrants and spans up to 9 cm by MRI. There is enhancement within the skin at the level of the nipple.   2. Multiple matted masses are seen in the right axilla, either abnormal metastatic lymph nodes or a combination of masses  and metastatic lymph nodes. One of these has been previously biopsied showing invasive mammary carcinoma without definitive nodal tissue.   3.  There is a 1.5 cm Rotter's lymph node on the right.   4. There is markedly abnormal enhancement and edema in the right pectoralis major muscle spanning 6-7 cm, suspicious for metastatic disease.   5. There is diffuse nodular enhancement throughout the left breast, which may correspond with the solid-appearing masses seen on ultrasound. These all have a similar appearance on MRI.   6.  No findings of left axillary lymphadenopathy.   12/15/2019 Procedure   PAC placed    12/24/2019 Imaging   MRI abdomen  IMPRESSION: 1. Multifocal enhancing bone lesions are noted within the lumbar spine, and bony pelvis compatible with metastatic disease. 2. Four, small peripherally enhancing cystic lesions are noted within the liver worrisome for metastatic disease. 3. 2 enhancing liver lesions are also noted with signal and enhancement characteristics consistent with benign liver hemangioma. 4. Well-circumscribed, enhancing T2 and T1 hypointense structure within the right adnexal region is indeterminate. It is unclear whether not this is arising from the right ovary, broad ligament or right side of uterine fundus. Differential considerations include fibrothecoma, versus pedunculated uterine leiomyoma. Metastatic disease is considered less favored. Attention on follow-up imaging is recommended.     01/03/2020 -  Chemotherapy   THP q3weeks starting 01/03/20    01/05/2020 Imaging   US Abdomen  IMPRESSION: 1. Suspect hematoma in the medial right lobe of the liver  in the area of previous biopsy. Note that recent MR does not show lesion in this area. This area has ill-defined margins and measures 3.9 x 4.0 x 3.5 cm. No perihepatic fluid.   2. Probable hemangiomas elsewhere in the right lobe of the liver. Note that several smaller lesions seen on recent MR  are not appreciable by ultrasound.   3.  Study otherwise unremarkable.   01/12/2020 Pathology Results   FINAL MICROSCOPIC DIAGNOSIS:   A. BONE, SCLEROTIC LESION, LEFT ANTERIOR ILIAC SPINE, BIOPSY:  - Metastatic carcinoma, consistent with patient's clinical history of  primary breast carcinoma  - See comment      COMMENT:   Immunohistochemical stains show that the tumor cells are positive for  CK7 and GATA3; and negative for CK20, consistent with above  interpretation.    PROGNOSTIC INDICATOR RESULTS:   Immunohistochemical and morphometric analysis performed manually   The tumor cells are NEGATIVE for Her2 (0); see comment   Estrogen Receptor: NEGATIVE; see comment  Progesterone Receptor: NEGATIVE; see comment       CURRENT THERAPY:  First line chemo THPq3weeksstarting 01/03/20  INTERVAL HISTORY:  Carolyn Stewart is here for a follow up and treatment. She presents to the clinic alone. She tolerated cycle 2 chemo well, mild fatigue for first week, no N/V/D, recovered well and worked for week 2 and 3. No pain or other complains  Good appetite and energy level, weight is stable.  She incidentally had left foot this morning, the first toe was slightly deformed, she wrapped it with tape no significant pain now, she is able to walk.  Review of systems otherwise negative.   MEDICAL HISTORY:  Past Medical History:  Diagnosis Date  . Cancer ([place]) 2021  . Hypertension     SURGICAL HISTORY: Past Surgical History:  Procedure Laterality Date  . left parotid tumor removal   2006  . PORTACATH PLACEMENT Left 12/15/2019   Procedure: INSERTION PORT-A-CATH WITH ULTRASOUND GUIDANCE;  Surgeon: [name], MD;  Location: [facility];  Service: General;  Laterality: Left;  . TONSILLECTOMY      I have reviewed the social history and family history with the patient and they are unchanged from previous note.  ALLERGIES:  has No Known Allergies.  MEDICATIONS:  Current Outpatient  Medications  Medication Sig Dispense Refill  . acetaminophen (TYLENOL) 500 MG tablet Take 1,000 mg by mouth every 6 (six) hours as needed for moderate pain or headache.    Marland Kitchen amLODipine (NORVASC) 10 MG tablet Take 10 mg by mouth daily.    . Cholecalciferol (VITAMIN D3) 50 MCG (2000 UT) TABS Take 2,000 Units by mouth daily.     Marland Kitchen dexamethasone (DECADRON) 4 MG tablet Take 2 tablets (8 mg total) by mouth 2 (two) times daily. Start the day before Taxotere. Then take daily x 3 days after chemotherapy. (Patient not taking: Reported on 01/22/2020) 30 tablet 1  . lidocaine-prilocaine (EMLA) cream Apply to affected area once (Patient not taking: Reported on 01/22/2020) 30 g 3  . metoprolol succinate (TOPROL-XL) 50 MG 24 hr tablet Take 50 mg by mouth daily.    Marland Kitchen omeprazole (PRILOSEC OTC) 20 MG tablet Take 20 mg by mouth daily.    . ondansetron (ZOFRAN) 8 MG tablet Take 1 tablet (8 mg total) by mouth 2 (two) times daily as needed (Nausea or vomiting). Start on the third day after chemotherapy. (Patient not taking: Reported on 01/22/2020) 30 tablet 1  . oxyCODONE (OXY IR/ROXICODONE) 5 MG immediate release tablet  Take 1 tablet (5 mg total) by mouth every 6 (six) hours as needed for severe pain. (Patient not taking: Reported on 12/30/2019) 10 tablet 0  . potassium chloride SA (KLOR-CON) 20 MEQ tablet Take 1 tablet (20 mEq total) by mouth daily. 30 tablet 0  . prochlorperazine (COMPAZINE) 10 MG tablet Take 1 tablet (10 mg total) by mouth every 6 (six) hours as needed (Nausea or vomiting). (Patient not taking: Reported on 01/22/2020) 30 tablet 1   No current facility-administered medications for this visit.    PHYSICAL EXAMINATION: ECOG PERFORMANCE STATUS: 1 - Symptomatic but completely ambulatory  Vitals:   02/12/20 0910  BP: 132/74  Pulse: 89  Resp: 17  Temp: 98.6 F (37 C)  SpO2: 98%   Filed Weights   02/12/20 0910  Weight: 192 lb 6.4 oz (87.3 kg)    GENERAL:alert, no distress and comfortable SKIN:  skin color, texture, turgor are normal, no rashes or significant lesions EYES: normal, Conjunctiva are pink and non-injected, sclera clear NECK: supple, thyroid normal size, non-tender, without nodularity LYMPH:  no palpable lymphadenopathy in the cervical, axillary  LUNGS: clear to auscultation and percussion with normal breathing effort HEART: regular rate & rhythm and no murmurs and no lower extremity edema ABDOMEN:abdomen soft, non-tender and normal bowel sounds Musculoskeletal:no cyanosis of digits and no clubbing, left foot toes are wrapped with tape, no edema, bruise or tenderness  NEURO: alert & oriented x 3 with fluent speech, no focal motor/sensory deficits Breasts: Breast inspection showed them to be symmetrical with no nipple discharge. Palpation of the breasts and axilla revealed a 6X3cm mass in the right upper quadrant of right breast, smaller than before, and a 2-3 cm right axillary node, no other obvious mass that I could appreciate.   LABORATORY DATA:  I have reviewed the data as listed CBC Latest Ref Rng & Units 02/12/2020 01/22/2020 01/12/2020  WBC 4.0 - 10.5 K/uL 8.3 15.5(H) 88.1(HH)  Hemoglobin 12.0 - 15.0 g/dL 10.4(L) 10.1(L) 11.5(L)  Hematocrit 36 - 46 % 32.8(L) 31.9(L) 36.0  Platelets 150 - 400 K/uL 342 363 240     CMP Latest Ref Rng & Units 02/12/2020 01/22/2020 01/12/2020  Glucose 70 - 99 mg/dL 103(H) 168(H) 114(H)  BUN 6 - 20 mg/dL 11 18 25(H)  Creatinine 0.44 - 1.00 mg/dL 0.84 0.84 0.86  Sodium 135 - 145 mmol/L 143 141 138  Potassium 3.5 - 5.1 mmol/L 3.3(L) 3.8 4.0  Chloride 98 - 111 mmol/L 110 110 105  CO2 22 - 32 mmol/L _0 Calcium 8.9 - 10.3 mg/dL 9.4 8.6(L) 9.0  Total Protein 6.5 - 8.1 g/dL 6.4(L) 6.4(L) -  Total Bilirubin 0.3 - 1.2 mg/dL 0.2(L) 0.3 -  Alkaline Phos 38 - 126 U/L 126 125 -  AST 15 - 41 U/L 14(L) 11(L) -  ALT 0 - 44 U/L 9 17 -      RADIOGRAPHIC STUDIES: I have personally reviewed the radiological images as listed and agreed  with the findings in the report. No results found.   ASSESSMENT & PLAN:  Eloyse Causey is a 59 y.o. female with    1.Malignant neoplasm of upper-outer quadrant of right breast,ductal carcinoma,StageIIA, c(T3N1Mx),with bone and possible liver mets,ER+/PR+/HER2+,GradeII-III -She was diagnosed in 11/2019 witha largeright breast mass in 4 quadrants measuringup to 11cm and right lymphadenopathy as well as indeterminate masses in left breast and one enlarged left axillar LN. Her right breast and LN biopsy was positive for invasiveductalcarcinoma with components of DCIS, calcifications and  necrosis in the breast. Her left axillarynodebiopsy was benign. -Given liver lesions and bone lesions (left L3, right 10th rib, left pelvis) concerning for metastatic disease she underwent liver biopsy on 01/01/20. Liver biopsy was negative for malignant cells but her 01/12/20 Bone Biopsy was positive for metastatic carcinoma from primary breast cancer. I reviewed with pt today. Her ER/PR/HER2 were all negative bone biopsy, possibly related to decalcification. -We discussed that that she has stage IV metastatic breast cancer, it is not curable but very treatable, with multiple new HER-2 antibodies available now.  I discussed the nature course of metastatic breast cancer, she voiced good understanding. -We do not plan to offer right breast surgery in the near future, chemotherapy, surgery may be an option down the road.  -She was also seen to have left breast mass on her 12/12/19 MRI, but given metastatic disease, no furhter biopsy is needed due to her metastatic disease.  -I started her on chemo with THP every 3 weeks on 01/03/20. She tolerated first 2 cycles very well  -Lab reviewed, adequate for treatment, will proceed to 3rd cycle THP today  -plan to repeat staging scan after 4 cycles chemo   2. Diarrhea, Constipation, and vaginal prolaps -The patient has been experiencing constipation following her  first cycle of THP.  With 2 bottles of MiraLAX on 01/08/20 she ended up causing significant diarrhea. -Her 01/09/20 ED visit indicated diarrhea as well as vaginal prolapse. The patient's diarrhea has since resolved.  -Constipation education was given to the patient. If she needs to use a laxative in the future, encouraged her to try to use half a bottle initially. -She will follow up with her OB/GYN regarding the vaginal prolapse. She was seen by Dr. Rip Harbour   3. HTN -Well controlled on Metoprolol and Amlodipine. Continue to f/u with PCP   4. Left foot injury  -will get x-ray today to rule out fracture   5. Hypokalemia -Refill potassium, she will take 20 mg every other day  6. Goal of care discussion  -We again discussed the incurable nature of her cancer, and the overall poor prognosis, especially if she does not have good response to chemotherapy or progress on chemo -The patient understands the goal of care is palliative. -she is full code now     PLAN: -Lab reviewed, adequate for treatment, will proceed to cycle 3 THP at same dose, no GCSF -x-ray to rule out left 5th toe fracture -Plan to repeat CT chest, abdomen and pelvis with contrast cycle 4 chemo -Follow-up in 3 weeks for cycle 4 chemo     No problem-specific Assessment & Plan notes found for this encounter.   Orders Placed This Encounter  Procedures  . CT Abdomen Pelvis W Contrast    Standing Status:   Future    Standing Expiration Date:   02/11/2021    Order Specific Question:   If indicated for the ordered procedure, I authorize the administration of contrast media per Radiology protocol    Answer:   Yes    Order Specific Question:   Is patient pregnant?    Answer:   No    Order Specific Question:   Preferred imaging location?    Answer:   [facility]    Order Specific Question:   Release to patient    Answer:   Immediate    Order Specific Question:   Is Oral Contrast requested for this exam?     Answer:   Yes, Per Radiology protocol  .  CT Chest W Contrast    Standing Status:   Future    Standing Expiration Date:   02/11/2021    Order Specific Question:   If indicated for the ordered procedure, I authorize the administration of contrast media per Radiology protocol    Answer:   Yes    Order Specific Question:   Is patient pregnant?    Answer:   No    Order Specific Question:   Preferred imaging location?    Answer:   [facility]    Standing Status:   Future    Standing Expiration Date:   02/11/2021    Order Specific Question:   Reason for Exam (SYMPTOM  OR DIAGNOSIS REQUIRED)    Answer:   rule out 5th toe fracture, injury this morning    Order Specific Question:   Is patient pregnant?    Answer:   No    Order Specific Question:   Preferred imaging location?    Answer:   [facility]   All questions were answered. The patient knows to call the clinic with any problems, questions or concerns. No barriers to learning was detected. The total time spent in the appointment was 30 minutes.     Truitt Merle, MD 02/12/2020

## 2020-02-12 NOTE — Patient Instructions (Signed)
[place] Discharge Instructions for Patients Receiving Chemotherapy  Today you received the following chemotherapy agents: trastuzumab, pertuzumab, and docetaxel.  To help prevent nausea and vomiting after your treatment, we encourage you to take your nausea medication as directed.   If you develop nausea and vomiting that is not controlled by your nausea medication, call the clinic.   BELOW ARE SYMPTOMS THAT SHOULD BE REPORTED IMMEDIATELY:  *FEVER GREATER THAN 100.5 F  *CHILLS WITH OR WITHOUT FEVER  NAUSEA AND VOMITING THAT IS NOT CONTROLLED WITH YOUR NAUSEA MEDICATION  *UNUSUAL SHORTNESS OF BREATH  *UNUSUAL BRUISING OR BLEEDING  TENDERNESS IN MOUTH AND THROAT WITH OR WITHOUT PRESENCE OF ULCERS  *URINARY PROBLEMS  *BOWEL PROBLEMS  UNUSUAL RASH Items with * indicate a potential emergency and should be followed up as soon as possible.  Feel free to call the clinic should you have any questions or concerns. The clinic phone number is [phone].  Please show the [facility] at check-in to the Emergency Department and triage nurse.

## 2020-02-13 ENCOUNTER — Telehealth: Payer: Self-pay | Admitting: Hematology

## 2020-02-13 ENCOUNTER — Encounter: Payer: Self-pay | Admitting: *Deleted

## 2020-02-13 LAB — CANCER ANTIGEN 27.29: CA 27.29: 35.8 U/mL (ref 0.0–38.6)

## 2020-02-13 NOTE — Telephone Encounter (Signed)
Scheduled appointments per 10/21 los. Will have updated calendar printed for patient at next visit.

## 2020-02-14 ENCOUNTER — Inpatient Hospital Stay: Payer: Commercial Managed Care - PPO

## 2020-02-14 ENCOUNTER — Telehealth: Payer: Self-pay | Admitting: Hematology

## 2020-02-14 NOTE — Telephone Encounter (Signed)
I called pt and discussed her x-ray result, which showed acute, displaced, moderately angulated extra-articular left fifth proximal phalanx fracture. Pt denies pain. I recommend her to go to urgent care, she agrees.   Truitt Merle  02/14/2020

## 2020-02-15 ENCOUNTER — Ambulatory Visit (HOSPITAL_COMMUNITY)
Admission: EM | Admit: 2020-02-15 | Discharge: 2020-02-15 | Disposition: A | Discharge status: Home / Self Care | Payer: Commercial Managed Care - PPO | Attending: Family Medicine | Admitting: Family Medicine

## 2020-02-15 ENCOUNTER — Other Ambulatory Visit: Payer: Self-pay

## 2020-02-15 ENCOUNTER — Encounter (HOSPITAL_COMMUNITY): Payer: Self-pay

## 2020-02-15 DIAGNOSIS — S92502A Displaced unspecified fracture of left lesser toe(s), initial encounter for closed fracture: Secondary | ICD-10-CM

## 2020-02-15 NOTE — ED Triage Notes (Signed)
Pt present she stomped her left pinky toe on Thursday and went to the doctor to have it xray. Her doctor called last night to inform her that the toe was fractured and to come into a [facility]. The xray is located in her chart.

## 2020-02-15 NOTE — Discharge Instructions (Addendum)
Call orthopedic in the morning to set up an appointment next week Keep toe taped close to the rest of your foot

## 2020-02-15 NOTE — ED Triage Notes (Signed)
Pt is cancer pt undergoing chemo.

## 2020-02-15 NOTE — ED Provider Notes (Signed)
[place]    CSN: [id number] Arrival date & time: 02/15/20  1008      History   Chief Complaint Chief Complaint  Patient presents with  . Foot Pain    Pinky toe    HPI Carolyn Stewart is a 59 y.o. female.   HPI  Patient stubbed her pinky toe on her left foot 3 days ago.  She was seen by her oncologist.  The oncologist ordered an x-ray.  She received a call that she has a fracture.  She was advised to come in here for management. X-ray is reviewed and shown to patient.  She does have a mildly angulated fifth toe fracture through the proximal phalanx. Patient states the pain is manageable  Past Medical History:  Diagnosis Date  . Cancer ([place]) 2021  . Hypertension     Patient Active Problem List   Diagnosis Date Noted  . Hypokalemia 01/09/2020  . Malignant neoplasm of upper-outer quadrant of right breast in female, estrogen receptor positive ([place]) 12/01/2019    Past Surgical History:  Procedure Laterality Date  . left parotid tumor removal   2006  . PORTACATH PLACEMENT Left 12/15/2019   Procedure: INSERTION PORT-A-CATH WITH ULTRASOUND GUIDANCE;  Surgeon: [name], MD;  Location: [facility];  Service: General;  Laterality: Left;  . TONSILLECTOMY      OB History   No obstetric history on file.      Home Medications    Prior to Admission medications   Medication Sig Start Date End Date Taking? Authorizing Provider  acetaminophen (TYLENOL) 500 MG tablet Take 1,000 mg by mouth every 6 (six) hours as needed for moderate pain or headache.    [provider]  amLODipine (NORVASC) 10 MG tablet Take 10 mg by mouth daily. 11/18/19   [provider]  Cholecalciferol (VITAMIN D3) 50 MCG (2000 UT) TABS Take 2,000 Units by mouth daily.     [provider]  dexamethasone (DECADRON) 4 MG tablet Take 2 tablets (8 mg total) by mouth 2 (two) times daily. Start the day before Taxotere. Then take daily x 3 days after chemotherapy. Patient not  taking: Reported on 01/22/2020 12/03/19   Truitt Merle, MD  lidocaine-prilocaine (EMLA) cream Apply to affected area once Patient not taking: Reported on 01/22/2020 12/03/19   Truitt Merle, MD  metoprolol succinate (TOPROL-XL) 50 MG 24 hr tablet Take 50 mg by mouth daily. 11/18/19   [provider]  omeprazole (PRILOSEC OTC) 20 MG tablet Take 20 mg by mouth daily.    [provider]  ondansetron (ZOFRAN) 8 MG tablet Take 1 tablet (8 mg total) by mouth 2 (two) times daily as needed (Nausea or vomiting). Start on the third day after chemotherapy. Patient not taking: Reported on 01/22/2020 12/03/19   Truitt Merle, MD  oxyCODONE (OXY IR/ROXICODONE) 5 MG immediate release tablet Take 1 tablet (5 mg total) by mouth every 6 (six) hours as needed for severe pain. Patient not taking: Reported on 12/30/2019 12/15/19   [name], MD  potassium chloride SA (KLOR-CON) 20 MEQ tablet Take 1 tablet (20 mEq total) by mouth daily. 02/12/20   Truitt Merle, MD  prochlorperazine (COMPAZINE) 10 MG tablet Take 1 tablet (10 mg total) by mouth every 6 (six) hours as needed (Nausea or vomiting). Patient not taking: Reported on 01/22/2020 12/03/19   Truitt Merle, MD    Family History Family History  Problem Relation Age of Onset  . Hypertension Mother   . Hypertension Father  Social History Social History   Tobacco Use  . Smoking status: Never Smoker  . Smokeless tobacco: Never Used  Vaping Use  . Vaping Use: Never used  Substance Use Topics  . Alcohol use: Yes    Comment: social drinker   . Drug use: No     Allergies   Patient has no known allergies.   Review of Systems Review of Systems  See HPI Physical Exam Triage Vital Signs ED Triage Vitals [02/15/20 1101]  Enc Vitals Group     BP (!) 148/91     Pulse Rate (!) 109     Resp 16     Temp 97.9 F (36.6 C)     Temp Source Oral     SpO2 100 %     Weight      Height      Head Circumference      Peak Flow      Pain Score 2     Pain Loc        Pain Edu?      Excl. in [place]?    No data found.  Updated Vital Signs BP (!) 148/91 (BP Location: Right Arm)   Pulse (!) 109   Temp 97.9 F (36.6 C) (Oral)   Resp 16   LMP 01/19/2013   SpO2 100%     Physical Exam Constitutional:      General: She is not in acute distress.    Appearance: She is well-developed.  HENT:     Head: Normocephalic and atraumatic.     Mouth/Throat:     Comments: Mask is in place Eyes:     Conjunctiva/sclera: Conjunctivae normal.     Pupils: Pupils are equal, round, and reactive to light.  Cardiovascular:     Rate and Rhythm: Normal rate.  Pulmonary:     Effort: Pulmonary effort is normal. No respiratory distress.  Abdominal:     General: There is no distension.     Palpations: Abdomen is soft.  Musculoskeletal:        General: Normal range of motion.     Cervical back: Normal range of motion.       Feet:  Skin:    General: Skin is warm and dry.  Neurological:     Mental Status: She is alert.      UC Treatments / Results  Labs (all labs ordered are listed, but only abnormal results are displayed) Labs Reviewed - No data to display  EKG   Radiology No results found.  Procedures Procedures (including critical care time)  Medications Ordered in UC Medications - No data to display  Initial Impression / Assessment and Plan / UC Course  I have reviewed the triage vital signs and the nursing notes.  Pertinent labs & imaging results that were available during my care of the patient were reviewed by me and considered in my medical decision making (see chart for details).     Final Clinical Impressions(s) / UC Diagnoses   Final diagnoses:  Closed fracture of phalanx of left fifth toe, initial encounter     Discharge Instructions     Call orthopedic in the morning to set up an appointment next week Keep toe taped close to the rest of your foot    ED Prescriptions    None     PDMP not reviewed this encounter.    Raylene Everts, MD 02/15/20 1130

## 2020-02-16 ENCOUNTER — Encounter: Payer: Self-pay | Admitting: *Deleted

## 2020-02-16 ENCOUNTER — Telehealth: Payer: Self-pay | Admitting: *Deleted

## 2020-02-16 NOTE — Telephone Encounter (Signed)
Pt called to inform she went to urgent care on 02/15/20 for fractured toe. Referral was recommended for orthopedic. Msg sent to Dr. Burr Medico.

## 2020-02-18 ENCOUNTER — Ambulatory Visit: Payer: Commercial Managed Care - PPO | Admitting: Obstetrics and Gynecology

## 2020-02-19 ENCOUNTER — Other Ambulatory Visit: Payer: Self-pay

## 2020-02-19 DIAGNOSIS — S92502A Displaced unspecified fracture of left lesser toe(s), initial encounter for closed fracture: Secondary | ICD-10-CM

## 2020-02-19 NOTE — Progress Notes (Signed)
Referral, ED note, xray result and demographics faxed to [facility] at [phone]

## 2020-02-26 ENCOUNTER — Other Ambulatory Visit: Payer: Self-pay | Admitting: Hematology

## 2020-02-26 DIAGNOSIS — E876 Hypokalemia: Secondary | ICD-10-CM

## 2020-03-03 NOTE — Progress Notes (Signed)
[place]   Telephone:([phone] [phone]   Clinic Follow up Note   Patient Care Team: Julian Hy, PA-C as PCP - General (Physician Assistant) [name], MD as Consulting Physician (General Surgery) Truitt Merle, MD as Consulting Physician (Hematology) Kyung Rudd, MD as Consulting Physician (Radiation Oncology) Mauro Kaufmann, RN as Oncology Nurse Navigator Rockwell Germany, RN as Oncology Nurse Navigator  Date of Service:  03/04/2020  CHIEF COMPLAINT: F/u of metastatic right breast cancer   SUMMARY OF ONCOLOGIC HISTORY: Oncology History Overview Note  Cancer Staging Malignant neoplasm of upper-outer quadrant of right breast in female, estrogen receptor positive ([place]) Staging form: Breast, AJCC 8th Edition - Clinical stage from 11/25/2019: Stage IIA (cT3, cN1, cM0, G2, ER+, PR+, HER2+) - Signed by Truitt Merle, MD on 12/03/2019    Malignant neoplasm of upper-outer quadrant of right breast in female, estrogen receptor positive ([place])  11/13/2019 Mammogram    Diagnostic Mammogram 11/13/19  IMPRESSION: -Highly suspicious mass and calcifications in the right breast centered between 9 and 12 o'clock spanning up to 11 cm.  -Multiple masses in the right axilla. One of the masses contains calcifications, denoted as mass number 2 measuring 9 x 11 by 10 mm. Several of these masses do not appear to represent definitive lymph nodes. Others may be small but abnormal appearing lymph nodes. Taken in total, a cluster of masses in the right axilla spans up to 3.7 cm.  -There are fibrocystic changes on the left. There are also some solid-appearing masses. A solid appearing mass at 10:30, 6 cm from the nipple measures 9 x 6 x 6 mm. An adjacent solid mass at 10 o'clock, 6 cm from the nipple measures 8 x 6 by 5 mm. Another solid-appearing mass at 11 o'clock, 1 cm from the nipple measures 5 x 4 by 5 mm. Fibrocystic changes are seen at 10 o'clock, 12 o'clock, and 4  o'clock. Another solid-appearing mass seen at 5 o'clock, 4 cm from the nipple measuring 6 x 3 x 4 mm. There is a single abnormal node in the left axilla with a cortex measuring 5 mm and restriction of the fatty hilum.   11/25/2019 Initial Biopsy   Diagnosis 1. Breast, right, needle core biopsy, upper outer - INVASIVE MAMMARY CARCINOMA - MAMMARY CARCINOMA IN SITU WITH CALCIFICATIONS AND NECROSIS - SEE COMMENT 2. Lymph node, needle/core biopsy, right axilla - INVASIVE MAMMARY CARCINOMA - SEE COMMENT 3. Lymph node, needle/core biopsy, left axilla - BENIGN LYMPH NODE - NO CARCINOMA IDENTIFIED Microscopic Comment 1. The biopsy material shows an infiltrative proliferation of cells arranged linearly and in small clusters. Based on the biopsy, the carcinoma appears Nottingham grade 2 of 3 and measures 1.2 cm in greatest linear extent. E-cadherin and prognostic markers (ER/PR/ki-67/HER2)are pending and will be reported in an addendum. Dr. Tresa Moore reviewed the case and agrees with the above diagnosis. These results were called to [facility] on November 26, 2019. 2. Definitive nodal tissue is not identified. This biopsy has a slightly different architecture morphology than what is seen in part 1. The carcinoma measures 0.7 cm in greatest dimension. E-cadherin and prognostic markers (ER, PR, Ki-67 and HER-2) are pending and will be reported in an addendum.   11/25/2019 Receptors her2   1. PROGNOSTIC INDICATORS Results: IMMUNOHISTOCHEMICAL AND MORPHOMETRIC ANALYSIS PERFORMED MANUALLY The tumor cells are POSITIVE for Her2 (3+). Estrogen Receptor: 90%, POSITIVE, STRONG STAINING INTENSITY Progesterone Receptor: 20%, POSITIVE, STRONG STAINING INTENSITY Proliferation Marker Ki67: 30%  2. PROGNOSTIC INDICATORS Results: IMMUNOHISTOCHEMICAL AND MORPHOMETRIC ANALYSIS PERFORMED MANUALLY The tumor cells are POSITIVE for Her2 (3+). Estrogen Receptor: 90%, POSITIVE, STRONG STAINING  INTENSITY Progesterone Receptor: 25%, POSITIVE, STRONG STAINING INTENSITY Proliferation Marker Ki67: 30%      11/25/2019 Cancer Staging   Staging form: Breast, AJCC 8th Edition - Clinical stage from 11/25/2019: Stage IIA (cT3, cN1, cM0, G2, ER+, PR+, HER2+) - Signed by Truitt Merle, MD on 12/03/2019   12/01/2019 Initial Diagnosis   Malignant neoplasm of upper-outer quadrant of right breast in female, estrogen receptor positive ([place])   12/10/2019 Imaging   Bone Scan  IMPRESSION: Findings concerning for bony metastatic disease in the left acetabulum region, L3 vertebral body region, and anterolateral right tenth rib.   Increased uptake in several large joints likely is of arthropathic etiology.   12/11/2019 Imaging   CT CAP w contrast  IMPRESSION: Prominent glandular tissue in the central right breast with nipple retraction, likely corresponding to the patient's known right breast neoplasm.   Prominent right axillary nodes measuring up to 8 mm short axis, suspicious for nodal metastases in this clinical context.   3 mm subpleural pulmonary nodule in the right lower lobe, likely benign. However, follow-up CT chest is suggested in 3-6 months.   Multiple hepatic lesions, including a dominant 2.4 cm lesion in segment 6, which is considered indeterminate. Metastasis is not excluded. Consider MRI abdomen with/without contrast for further characterization.   11 mm sclerotic lesion along the left posterior aspect of the T7 vertebral body raises concern for isolated osseous metastasis. Correlate with priors if available. Benign vertebral hemangioma at T11.   12/12/2019 Breast MRI   IMPRESSION: 1. The biopsy proven malignancy in the right breast involves all 4 quadrants and spans up to 9 cm by MRI. There is enhancement within the skin at the level of the nipple.   2. Multiple matted masses are seen in the right axilla, either abnormal metastatic lymph nodes or a combination of masses  and metastatic lymph nodes. One of these has been previously biopsied showing invasive mammary carcinoma without definitive nodal tissue.   3.  There is a 1.5 cm Rotter's lymph node on the right.   4. There is markedly abnormal enhancement and edema in the right pectoralis major muscle spanning 6-7 cm, suspicious for metastatic disease.   5. There is diffuse nodular enhancement throughout the left breast, which may correspond with the solid-appearing masses seen on ultrasound. These all have a similar appearance on MRI.   6.  No findings of left axillary lymphadenopathy.   12/15/2019 Procedure   PAC placed    12/24/2019 Imaging   MRI abdomen  IMPRESSION: 1. Multifocal enhancing bone lesions are noted within the lumbar spine, and bony pelvis compatible with metastatic disease. 2. Four, small peripherally enhancing cystic lesions are noted within the liver worrisome for metastatic disease. 3. 2 enhancing liver lesions are also noted with signal and enhancement characteristics consistent with benign liver hemangioma. 4. Well-circumscribed, enhancing T2 and T1 hypointense structure within the right adnexal region is indeterminate. It is unclear whether not this is arising from the right ovary, broad ligament or right side of uterine fundus. Differential considerations include fibrothecoma, versus pedunculated uterine leiomyoma. Metastatic disease is considered less favored. Attention on follow-up imaging is recommended.     01/03/2020 -  Chemotherapy   First line THP q3weeks starting 01/03/20    01/05/2020 Imaging   US Abdomen  IMPRESSION: 1. Suspect hematoma in the medial right lobe of  the liver in the area of previous biopsy. Note that recent MR does not show lesion in this area. This area has ill-defined margins and measures 3.9 x 4.0 x 3.5 cm. No perihepatic fluid.   2. Probable hemangiomas elsewhere in the right lobe of the liver. Note that several smaller lesions seen on  recent MR are not appreciable by ultrasound.   3.  Study otherwise unremarkable.   01/12/2020 Pathology Results   FINAL MICROSCOPIC DIAGNOSIS:   A. BONE, SCLEROTIC LESION, LEFT ANTERIOR ILIAC SPINE, BIOPSY:  - Metastatic carcinoma, consistent with patient's clinical history of  primary breast carcinoma  - See comment      COMMENT:   Immunohistochemical stains show that the tumor cells are positive for  CK7 and GATA3; and negative for CK20, consistent with above  interpretation.    PROGNOSTIC INDICATOR RESULTS:   Immunohistochemical and morphometric analysis performed manually   The tumor cells are NEGATIVE for Her2 (0); see comment   Estrogen Receptor: NEGATIVE; see comment  Progesterone Receptor: NEGATIVE; see comment       CURRENT THERAPY:  First line chemo THPq3weeksstarting 01/03/20  INTERVAL HISTORY:  Carolyn Stewart is here for a follow up. She presents to the clinic alone. She has been tolerating chemotherapy very well overall. Mild fatigue after chemo, no other side effects  She noticed mild intermittent tingling on her toes and fingers, no impact on her hand function or walking. She is eating well, weight is stable.  BM normal, no diarrhea.  All other systems were reviewed with the patient and are negative.  MEDICAL HISTORY:  Past Medical History:  Diagnosis Date  . Cancer ([place]) 2021  . Hypertension     SURGICAL HISTORY: Past Surgical History:  Procedure Laterality Date  . left parotid tumor removal   2006  . PORTACATH PLACEMENT Left 12/15/2019   Procedure: INSERTION PORT-A-CATH WITH ULTRASOUND GUIDANCE;  Surgeon: [name], MD;  Location: [facility];  Service: General;  Laterality: Left;  . TONSILLECTOMY      I have reviewed the social history and family history with the patient and they are unchanged from previous note.  ALLERGIES:  has No Known Allergies.  MEDICATIONS:  Current Outpatient Medications  Medication Sig Dispense Refill  .  acetaminophen (TYLENOL) 500 MG tablet Take 1,000 mg by mouth every 6 (six) hours as needed for moderate pain or headache.    Marland Kitchen amLODipine (NORVASC) 10 MG tablet Take 10 mg by mouth daily.    . Cholecalciferol (VITAMIN D3) 50 MCG (2000 UT) TABS Take 2,000 Units by mouth daily.     Marland Kitchen dexamethasone (DECADRON) 4 MG tablet Take 2 tablets (8 mg total) by mouth 2 (two) times daily. Start the day before Taxotere. Then take daily x 3 days after chemotherapy. (Patient not taking: Reported on 01/22/2020) 30 tablet 1  . KLOR-CON M20 20 MEQ tablet TAKE 1 TABLET BY MOUTH EVERY DAY 90 tablet 1  . lidocaine-prilocaine (EMLA) cream Apply to affected area once (Patient not taking: Reported on 01/22/2020) 30 g 3  . metoprolol succinate (TOPROL-XL) 50 MG 24 hr tablet Take 50 mg by mouth daily.    Marland Kitchen omeprazole (PRILOSEC OTC) 20 MG tablet Take 20 mg by mouth daily.    . ondansetron (ZOFRAN) 8 MG tablet Take 1 tablet (8 mg total) by mouth 2 (two) times daily as needed (Nausea or vomiting). Start on the third day after chemotherapy. (Patient not taking: Reported on 01/22/2020) 30 tablet 1  . oxyCODONE (OXY IR/ROXICODONE)  5 MG immediate release tablet Take 1 tablet (5 mg total) by mouth every 6 (six) hours as needed for severe pain. (Patient not taking: Reported on 12/30/2019) 10 tablet 0  . prochlorperazine (COMPAZINE) 10 MG tablet Take 1 tablet (10 mg total) by mouth every 6 (six) hours as needed (Nausea or vomiting). (Patient not taking: Reported on 01/22/2020) 30 tablet 1   No current facility-administered medications for this visit.   Facility-Administered Medications Ordered in Other Visits  Medication Dose Route Frequency Provider Last Rate Last Admin  . DOCEtaxel (TAXOTERE) 140 mg in sodium chloride 0.9 % 250 mL chemo infusion  75 mg/m2 (Order-Specific) Intravenous Once Truitt Merle, MD      . heparin lock flush 100 unit/mL  500 Units Intracatheter Once PRN Truitt Merle, MD      . pertuzumab (PERJETA) 420 mg in sodium chloride  0.9 % 250 mL chemo infusion  420 mg Intravenous Once Truitt Merle, MD 528 mL/hr at 03/04/20 1203 420 mg at 03/04/20 1203  . sodium chloride flush (NS) 0.9 % injection 10 mL  10 mL Intracatheter PRN Truitt Merle, MD        PHYSICAL EXAMINATION: ECOG PERFORMANCE STATUS: 0 - Asymptomatic  Vitals:   03/04/20 0941  BP: (!) 151/58  Pulse: 91  Resp: 17  Temp: (!) 97.3 F (36.3 C)  SpO2: 98%   Filed Weights   03/04/20 0941  Weight: 196 lb 9.6 oz (89.2 kg)    GENERAL:alert, no distress and comfortable SKIN: skin color, texture, turgor are normal, no rashes or significant lesions EYES: normal, Conjunctiva are pink and non-injected, sclera clear NECK: supple, thyroid normal size, non-tender, without nodularity LYMPH:  no palpable lymphadenopathy in the cervical, axillary  LUNGS: clear to auscultation and percussion with normal breathing effort HEART: regular rate & rhythm and no murmurs and no lower extremity edema ABDOMEN:abdomen soft, non-tender and normal bowel sounds Musculoskeletal:no cyanosis of digits and no clubbing  NEURO: alert & oriented x 3 with fluent speech, no focal motor/sensory deficits Breasts: Breast inspection showed them to be symmetrical with no nipple discharge. Palpation of the breasts and axilla revealed a 4X6cm soft mass in the upper outer quadrant of the right breast, 2 cm lymph nodes in the right axilla, no other obvious mass that I could appreciate.  4X6 cm   LABORATORY DATA:  I have reviewed the data as listed CBC Latest Ref Rng & Units 03/04/2020 02/12/2020 01/22/2020  WBC 4.0 - 10.5 K/uL 7.3 8.3 15.5(H)  Hemoglobin 12.0 - 15.0 g/dL 10.5(L) 10.4(L) 10.1(L)  Hematocrit 36 - 46 % 33.9(L) 32.8(L) 31.9(L)  Platelets 150 - 400 K/uL 391 342 363     CMP Latest Ref Rng & Units 03/04/2020 02/12/2020 01/22/2020  Glucose 70 - 99 mg/dL 80 103(H) 168(H)  BUN 6 - 20 mg/dL _0 Creatinine 0.44 - 1.00 mg/dL 0.81 0.84 0.84  Sodium 135 - 145 mmol/L 145 143 141    Potassium 3.5 - 5.1 mmol/L 3.6 3.3(L) 3.8  Chloride 98 - 111 mmol/L 111 110 110  CO2 22 - 32 mmol/L _1 Calcium 8.9 - 10.3 mg/dL 8.9 9.4 8.6(L)  Total Protein 6.5 - 8.1 g/dL 5.9(L) 6.4(L) 6.4(L)  Total Bilirubin 0.3 - 1.2 mg/dL 0.3 0.2(L) 0.3  Alkaline Phos 38 - 126 U/L 101 126 125  AST 15 - 41 U/L 17 14(L) 11(L)  ALT 0 - 44 U/L _2 RADIOGRAPHIC STUDIES: I have personally  reviewed the radiological images as listed and agreed with the findings in the report. No results found.   ASSESSMENT & PLAN:  Bryleigh Ottaway is a 59 y.o. female with   1.Malignant neoplasm of upper-outer quadrant of right breast,ductal carcinoma,StageIIA, c(T3N1Mx),with bone and possible liver mets,ER+/PR+/HER2+,GradeII-III -She was diagnosed in 11/2019 witha largeright breast mass in 4 quadrants measuringup to 11cm and right lymphadenopathy as well as indeterminate masses in left breast and one enlarged left axillar LN. Her right breast and LN biopsy was positive for invasiveductalcarcinoma with components of DCIS, calcifications and necrosis in the breast. Her left axillarynodebiopsy was benign. -01/01/20 Liver biopsy was negativefor malignant cellsbut her 01/12/20 Bone Biopsy was positive for metastatic carcinoma from primary breast cancer. Her ER/PR/HER2 were all negative bone biopsy, possibly related to decalcification. -She was also seen to have left breast mass on her 12/12/19 MRI, but given metastatic disease, no furhter biopsy is needed due to her metastatic disease.  -We previously discussed that she has stage IV metastatic breast cancer which is not curable but very treatable. We do not plan to offer right breast surgery in the near future, but may be an option down the road. -I started her on first line THP every 3 weeks on 01/03/20. Plan to repeat staging scan after C4  -Depends on her response to chemotherapy, will consider switch to maintenance therapy with Herceptin, Perjeta  and AI after 6-8 cycle chemo THP  -She is tolerating chemotherapy very well.  Lab reviewed, adequate for treatment, will proceed to cycle 4 THP today -Follow-up in 3 weeks with CT chest, abdomen pelvis with contrast a few days before.    2. Diarrhea, Constipation, and vaginal prolaps -The patient has been experiencing constipation following her first cycle of THP. With 2 bottles of MiraLAX on 9/16/21she ended up causing significant diarrhea. -Her 01/09/20 ED visit indicated diarrhea as well as vaginal prolapse.The patient's diarrhea has since resolved.  -Constipation education was given to the patient.If she needs to use a laxative in the future, encouraged her to try to use half a bottle initially. -She will follow up with her OB/GYN regarding the vaginal prolapse. She was seen by Dr. Rip Harbour.  She plans to have hysterectomy when she is off chemo.  3. HTN -Well controlled on Metoprolol andAmlodipine. Continue to f/u with PCP   4. Left foot injury  -will get x-ray today to rule out fracture   5. Hypokalemia -Refill potassium, she will take 20 mg every other day  6. Goal of care discussion  -she is full code now     PLAN: -Lab reviewed, adequate for treatment, will proceed to cycle 4 THP at same dose, no GCSF -Plan to repeat CT chest, abdomen and pelvis with contrast cycle 4 chemo -Follow-up in 3 weeks for cycle 5 chemo   No problem-specific Assessment & Plan notes found for this encounter.   Orders Placed This Encounter  Procedures  . CT CHEST ABDOMEN PELVIS W CONTRAST    Standing Status:   Future    Standing Expiration Date:   03/04/2021    Order Specific Question:   If indicated for the ordered procedure, I authorize the administration of contrast media per Radiology protocol    Answer:   Yes    Order Specific Question:   Is patient pregnant?    Answer:   No    Order Specific Question:   Preferred imaging location?    Answer:   [facility]     Order  Specific Question:   Release to patient    Answer:   Immediate    Order Specific Question:   Is Oral Contrast requested for this exam?    Answer:   Yes, Per Radiology protocol    Order Specific Question:   Reason for Exam (SYMPTOM  OR DIAGNOSIS REQUIRED)    Answer:   caner restaging, pt on chemo   All questions were answered. The patient knows to call the clinic with any problems, questions or concerns. No barriers to learning was detected. The total time spent in the appointment was 30 minutes.     Truitt Merle, MD 03/04/2020   I, Joslyn Devon, am acting as scribe for Truitt Merle, MD.   I have reviewed the above documentation for accuracy and completeness, and I agree with the above.

## 2020-03-04 ENCOUNTER — Inpatient Hospital Stay: Payer: Commercial Managed Care - PPO | Attending: Hematology

## 2020-03-04 ENCOUNTER — Inpatient Hospital Stay (HOSPITAL_BASED_OUTPATIENT_CLINIC_OR_DEPARTMENT_OTHER): Payer: Commercial Managed Care - PPO | Admitting: Hematology

## 2020-03-04 ENCOUNTER — Encounter: Payer: Self-pay | Admitting: *Deleted

## 2020-03-04 ENCOUNTER — Inpatient Hospital Stay: Payer: Commercial Managed Care - PPO

## 2020-03-04 ENCOUNTER — Other Ambulatory Visit: Payer: Self-pay

## 2020-03-04 ENCOUNTER — Encounter: Payer: Self-pay | Admitting: Hematology

## 2020-03-04 VITALS — BP 151/58 | HR 91 | Temp 97.3°F | Resp 17 | Ht 67.0 in | Wt 196.6 lb

## 2020-03-04 DIAGNOSIS — K5900 Constipation, unspecified: Secondary | ICD-10-CM | POA: Diagnosis not present

## 2020-03-04 DIAGNOSIS — Z170 Estrogen receptor positive status [ER+]: Secondary | ICD-10-CM

## 2020-03-04 DIAGNOSIS — C50411 Malignant neoplasm of upper-outer quadrant of right female breast: Secondary | ICD-10-CM

## 2020-03-04 DIAGNOSIS — Z5112 Encounter for antineoplastic immunotherapy: Secondary | ICD-10-CM | POA: Diagnosis not present

## 2020-03-04 DIAGNOSIS — Z5111 Encounter for antineoplastic chemotherapy: Secondary | ICD-10-CM | POA: Insufficient documentation

## 2020-03-04 DIAGNOSIS — Z7952 Long term (current) use of systemic steroids: Secondary | ICD-10-CM | POA: Diagnosis not present

## 2020-03-04 DIAGNOSIS — Z79899 Other long term (current) drug therapy: Secondary | ICD-10-CM | POA: Diagnosis not present

## 2020-03-04 DIAGNOSIS — I10 Essential (primary) hypertension: Secondary | ICD-10-CM | POA: Insufficient documentation

## 2020-03-04 DIAGNOSIS — E876 Hypokalemia: Secondary | ICD-10-CM | POA: Diagnosis not present

## 2020-03-04 LAB — CBC WITH DIFFERENTIAL (CANCER CENTER ONLY)
Abs Immature Granulocytes: 0.05 10*3/uL (ref 0.00–0.07)
Basophils Absolute: 0 10*3/uL (ref 0.0–0.1)
Basophils Relative: 0 %
Eosinophils Absolute: 0.1 10*3/uL (ref 0.0–0.5)
Eosinophils Relative: 1 %
HCT: 33.9 % — ABNORMAL LOW (ref 36.0–46.0)
Hemoglobin: 10.5 g/dL — ABNORMAL LOW (ref 12.0–15.0)
Immature Granulocytes: 1 %
Lymphocytes Relative: 41 %
Lymphs Abs: 3 10*3/uL (ref 0.7–4.0)
MCH: 28.8 pg (ref 26.0–34.0)
MCHC: 31 g/dL (ref 30.0–36.0)
MCV: 93.1 fL (ref 80.0–100.0)
Monocytes Absolute: 0.9 10*3/uL (ref 0.1–1.0)
Monocytes Relative: 12 %
Neutro Abs: 3.3 10*3/uL (ref 1.7–7.7)
Neutrophils Relative %: 45 %
Platelet Count: 391 10*3/uL (ref 150–400)
RBC: 3.64 MIL/uL — ABNORMAL LOW (ref 3.87–5.11)
RDW: 15.9 % — ABNORMAL HIGH (ref 11.5–15.5)
WBC Count: 7.3 10*3/uL (ref 4.0–10.5)
nRBC: 0 % (ref 0.0–0.2)

## 2020-03-04 LAB — CMP (CANCER CENTER ONLY)
ALT: 11 U/L (ref 0–44)
AST: 17 U/L (ref 15–41)
Albumin: 3.5 g/dL (ref 3.5–5.0)
Alkaline Phosphatase: 101 U/L (ref 38–126)
Anion gap: 8 (ref 5–15)
BUN: 12 mg/dL (ref 6–20)
CO2: 26 mmol/L (ref 22–32)
Calcium: 8.9 mg/dL (ref 8.9–10.3)
Chloride: 111 mmol/L (ref 98–111)
Creatinine: 0.81 mg/dL (ref 0.44–1.00)
GFR, Estimated: >60 mL/min (ref >60)
Glucose, Bld: 80 mg/dL (ref 70–99)
Potassium: 3.6 mmol/L (ref 3.5–5.1)
Sodium: 145 mmol/L (ref 135–145)
Total Bilirubin: 0.3 mg/dL (ref 0.3–1.2)
Total Protein: 5.9 g/dL — ABNORMAL LOW (ref 6.5–8.1)

## 2020-03-04 MED ORDER — TRASTUZUMAB-ANNS CHEMO 150 MG IV SOLR
6.0000 mg/kg | Freq: Once | INTRAVENOUS | Status: AC
  Administered 2020-03-04: 525 mg via INTRAVENOUS
  Filled 2020-03-04: qty 25

## 2020-03-04 MED ORDER — DIPHENHYDRAMINE HCL 25 MG PO CAPS
ORAL_CAPSULE | ORAL | Status: AC
  Filled 2020-03-04: qty 2

## 2020-03-04 MED ORDER — SODIUM CHLORIDE 0.9% FLUSH
10.0000 mL | Freq: Once | INTRAVENOUS | Status: AC
  Administered 2020-03-04: 10 mL
  Filled 2020-03-04: qty 10

## 2020-03-04 MED ORDER — PALONOSETRON HCL INJECTION 0.25 MG/5ML
0.2500 mg | Freq: Once | INTRAVENOUS | Status: AC
  Administered 2020-03-04: 0.25 mg via INTRAVENOUS

## 2020-03-04 MED ORDER — PALONOSETRON HCL INJECTION 0.25 MG/5ML
INTRAVENOUS | Status: AC
  Filled 2020-03-04: qty 5

## 2020-03-04 MED ORDER — ACETAMINOPHEN 325 MG PO TABS
650.0000 mg | ORAL_TABLET | Freq: Once | ORAL | Status: AC
  Administered 2020-03-04: 650 mg via ORAL

## 2020-03-04 MED ORDER — SODIUM CHLORIDE 0.9 % IV SOLN
Freq: Once | INTRAVENOUS | Status: AC
  Filled 2020-03-04: qty 250

## 2020-03-04 MED ORDER — SODIUM CHLORIDE 0.9% FLUSH
10.0000 mL | INTRAVENOUS | Status: DC | PRN
  Administered 2020-03-04: 10 mL
  Filled 2020-03-04: qty 10

## 2020-03-04 MED ORDER — SODIUM CHLORIDE 0.9 % IV SOLN
420.0000 mg | Freq: Once | INTRAVENOUS | Status: AC
  Administered 2020-03-04: 420 mg via INTRAVENOUS
  Filled 2020-03-04: qty 14

## 2020-03-04 MED ORDER — SODIUM CHLORIDE 0.9 % IV SOLN
10.0000 mg | Freq: Once | INTRAVENOUS | Status: AC
  Administered 2020-03-04: 10 mg via INTRAVENOUS
  Filled 2020-03-04: qty 10

## 2020-03-04 MED ORDER — ACETAMINOPHEN 325 MG PO TABS
ORAL_TABLET | ORAL | Status: AC
  Filled 2020-03-04: qty 2

## 2020-03-04 MED ORDER — SODIUM CHLORIDE 0.9 % IV SOLN
75.0000 mg/m2 | Freq: Once | INTRAVENOUS | Status: AC
  Administered 2020-03-04: 140 mg via INTRAVENOUS
  Filled 2020-03-04: qty 14

## 2020-03-04 MED ORDER — DIPHENHYDRAMINE HCL 25 MG PO CAPS
50.0000 mg | ORAL_CAPSULE | Freq: Once | ORAL | Status: AC
  Administered 2020-03-04: 50 mg via ORAL

## 2020-03-04 MED ORDER — HEPARIN SOD (PORK) LOCK FLUSH 100 UNIT/ML IV SOLN
500.0000 [IU] | Freq: Once | INTRAVENOUS | Status: AC | PRN
  Administered 2020-03-04: 500 [IU]
  Filled 2020-03-04: qty 5

## 2020-03-04 NOTE — Patient Instructions (Signed)

## 2020-03-04 NOTE — Patient Instructions (Signed)
[facility] Discharge Instructions for Patients Receiving Chemotherapy  Today you received the following chemotherapy agents Trastuzumab-anns (KANJINTI), Pertuzumab (PERJETA) & Docetaxel (TAXOTERE).  To help prevent nausea and vomiting after your treatment, we encourage you to take your nausea medication as prescribed.   If you develop nausea and vomiting that is not controlled by your nausea medication, call the clinic.   BELOW ARE SYMPTOMS THAT SHOULD BE REPORTED IMMEDIATELY:  *FEVER GREATER THAN 100.5 F  *CHILLS WITH OR WITHOUT FEVER  NAUSEA AND VOMITING THAT IS NOT CONTROLLED WITH YOUR NAUSEA MEDICATION  *UNUSUAL SHORTNESS OF BREATH  *UNUSUAL BRUISING OR BLEEDING  TENDERNESS IN MOUTH AND THROAT WITH OR WITHOUT PRESENCE OF ULCERS  *URINARY PROBLEMS  *BOWEL PROBLEMS  UNUSUAL RASH Items with * indicate a potential emergency and should be followed up as soon as possible.  Feel free to call the clinic should you have any questions or concerns. The clinic phone number is [phone].  Please show the CHEMO ALERT CARD at check-in to the Emergency Department and triage nurse.   

## 2020-03-05 ENCOUNTER — Telehealth: Payer: Self-pay | Admitting: Hematology

## 2020-03-05 LAB — CANCER ANTIGEN 27.29: CA 27.29: 41.6 U/mL — ABNORMAL HIGH (ref 0.0–38.6)

## 2020-03-05 NOTE — Telephone Encounter (Signed)
Scheduled per 11/11 los. Pt is aware of appt times added on 12/23

## 2020-03-06 ENCOUNTER — Inpatient Hospital Stay: Payer: Commercial Managed Care - PPO

## 2020-03-23 ENCOUNTER — Other Ambulatory Visit: Payer: Self-pay

## 2020-03-23 ENCOUNTER — Ambulatory Visit (HOSPITAL_COMMUNITY)
Admission: RE | Admit: 2020-03-23 | Discharge: 2020-03-23 | Disposition: A | Discharge status: Home / Self Care | Payer: Commercial Managed Care - PPO | Source: Ambulatory Visit | Attending: Hematology | Admitting: Hematology

## 2020-03-23 DIAGNOSIS — C50411 Malignant neoplasm of upper-outer quadrant of right female breast: Secondary | ICD-10-CM | POA: Diagnosis present

## 2020-03-23 DIAGNOSIS — Z170 Estrogen receptor positive status [ER+]: Secondary | ICD-10-CM | POA: Insufficient documentation

## 2020-03-23 MED ORDER — IOHEXOL 300 MG/ML  SOLN
100.0000 mL | Freq: Once | INTRAMUSCULAR | Status: AC | PRN
  Administered 2020-03-23: 100 mL via INTRAVENOUS

## 2020-03-23 NOTE — Progress Notes (Signed)
[name]   [phone] [phone]   Clinic Follow up Note   Patient Care Team: Julian Hy, PA-C as PCP - General (Physician Assistant) [name], MD as Consulting Physician (General Surgery) Truitt Merle, MD as Consulting Physician (Hematology) Kyung Rudd, MD as Consulting Physician (Radiation Oncology) Mauro Kaufmann, RN as Oncology Nurse Navigator Rockwell Germany, RN as Oncology Nurse Navigator  Date of Service:  03/25/2020  CHIEF COMPLAINT: F/u of metastatic right breast cancer   SUMMARY OF ONCOLOGIC HISTORY: Oncology History Overview Note  Cancer Staging Malignant neoplasm of upper-outer quadrant of right breast in female, estrogen receptor positive ([place]) Staging form: Breast, AJCC 8th Edition - Clinical stage from 11/25/2019: Stage IIA (cT3, cN1, cM0, G2, ER+, PR+, HER2+) - Signed by Truitt Merle, MD on 12/03/2019    Malignant neoplasm of upper-outer quadrant of right breast in female, estrogen receptor positive ([place])  11/13/2019 Mammogram    Diagnostic Mammogram 11/13/19  IMPRESSION: -Highly suspicious mass and calcifications in the right breast centered between 9 and 12 o'clock spanning up to 11 cm.  -Multiple masses in the right axilla. One of the masses contains calcifications, denoted as mass number 2 measuring 9 x 11 by 10 mm. Several of these masses do not appear to represent definitive lymph nodes. Others may be small but abnormal appearing lymph nodes. Taken in total, a cluster of masses in the right axilla spans up to 3.7 cm.  -There are fibrocystic changes on the left. There are also some solid-appearing masses. A solid appearing mass at 10:30, 6 cm from the nipple measures 9 x 6 x 6 mm. An adjacent solid mass at 10 o'clock, 6 cm from the nipple measures 8 x 6 by 5 mm. Another solid-appearing mass at 11 o'clock, 1 cm from the nipple measures 5 x 4 by 5 mm. Fibrocystic changes are seen at 10 o'clock, 12 o'clock, and 4  o'clock. Another solid-appearing mass seen at 5 o'clock, 4 cm from the nipple measuring 6 x 3 x 4 mm. There is a single abnormal node in the left axilla with a cortex measuring 5 mm and restriction of the fatty hilum.   11/25/2019 Initial Biopsy   Diagnosis 1. Breast, right, needle core biopsy, upper outer - INVASIVE MAMMARY CARCINOMA - MAMMARY CARCINOMA IN SITU WITH CALCIFICATIONS AND NECROSIS - SEE COMMENT 2. Lymph node, needle/core biopsy, right axilla - INVASIVE MAMMARY CARCINOMA - SEE COMMENT 3. Lymph node, needle/core biopsy, left axilla - BENIGN LYMPH NODE - NO CARCINOMA IDENTIFIED Microscopic Comment 1. The biopsy material shows an infiltrative proliferation of cells arranged linearly and in small clusters. Based on the biopsy, the carcinoma appears Nottingham grade 2 of 3 and measures 1.2 cm in greatest linear extent. E-cadherin and prognostic markers (ER/PR/ki-67/HER2)are pending and will be reported in an addendum. Dr. Tresa Moore reviewed the case and agrees with the above diagnosis. These results were called to [facility] on November 26, 2019. 2. Definitive nodal tissue is not identified. This biopsy has a slightly different architecture morphology than what is seen in part 1. The carcinoma measures 0.7 cm in greatest dimension. E-cadherin and prognostic markers (ER, PR, Ki-67 and HER-2) are pending and will be reported in an addendum.   11/25/2019 Receptors her2   1. PROGNOSTIC INDICATORS Results: IMMUNOHISTOCHEMICAL AND MORPHOMETRIC ANALYSIS PERFORMED MANUALLY The tumor cells are POSITIVE for Her2 (3+). Estrogen Receptor: 90%, POSITIVE, STRONG STAINING INTENSITY Progesterone Receptor: 20%, POSITIVE, STRONG STAINING INTENSITY Proliferation Marker Ki67: 30%  2. PROGNOSTIC INDICATORS Results: IMMUNOHISTOCHEMICAL AND MORPHOMETRIC ANALYSIS PERFORMED MANUALLY The tumor cells are POSITIVE for Her2 (3+). Estrogen Receptor: 90%, POSITIVE, STRONG STAINING  INTENSITY Progesterone Receptor: 25%, POSITIVE, STRONG STAINING INTENSITY Proliferation Marker Ki67: 30%      11/25/2019 Cancer Staging   Staging form: Breast, AJCC 8th Edition - Clinical stage from 11/25/2019: Stage IIA (cT3, cN1, cM0, G2, ER+, PR+, HER2+) - Signed by Truitt Merle, MD on 12/03/2019   12/01/2019 Initial Diagnosis   Malignant neoplasm of upper-outer quadrant of right breast in female, estrogen receptor positive ([place])   12/10/2019 Imaging   Bone Scan  IMPRESSION: Findings concerning for bony metastatic disease in the left acetabulum region, L3 vertebral body region, and anterolateral right tenth rib.   Increased uptake in several large joints likely is of arthropathic etiology.   12/11/2019 Imaging   CT CAP w contrast  IMPRESSION: Prominent glandular tissue in the central right breast with nipple retraction, likely corresponding to the patient's known right breast neoplasm.   Prominent right axillary nodes measuring up to 8 mm short axis, suspicious for nodal metastases in this clinical context.   3 mm subpleural pulmonary nodule in the right lower lobe, likely benign. However, follow-up CT chest is suggested in 3-6 months.   Multiple hepatic lesions, including a dominant 2.4 cm lesion in segment 6, which is considered indeterminate. Metastasis is not excluded. Consider MRI abdomen with/without contrast for further characterization.   11 mm sclerotic lesion along the left posterior aspect of the T7 vertebral body raises concern for isolated osseous metastasis. Correlate with priors if available. Benign vertebral hemangioma at T11.   12/12/2019 Breast MRI   IMPRESSION: 1. The biopsy proven malignancy in the right breast involves all 4 quadrants and spans up to 9 cm by MRI. There is enhancement within the skin at the level of the nipple.   2. Multiple matted masses are seen in the right axilla, either abnormal metastatic lymph nodes or a combination of masses  and metastatic lymph nodes. One of these has been previously biopsied showing invasive mammary carcinoma without definitive nodal tissue.   3.  There is a 1.5 cm Rotter's lymph node on the right.   4. There is markedly abnormal enhancement and edema in the right pectoralis major muscle spanning 6-7 cm, suspicious for metastatic disease.   5. There is diffuse nodular enhancement throughout the left breast, which may correspond with the solid-appearing masses seen on ultrasound. These all have a similar appearance on MRI.   6.  No findings of left axillary lymphadenopathy.   12/15/2019 Procedure   PAC placed    12/24/2019 Imaging   MRI abdomen  IMPRESSION: 1. Multifocal enhancing bone lesions are noted within the lumbar spine, and bony pelvis compatible with metastatic disease. 2. Four, small peripherally enhancing cystic lesions are noted within the liver worrisome for metastatic disease. 3. 2 enhancing liver lesions are also noted with signal and enhancement characteristics consistent with benign liver hemangioma. 4. Well-circumscribed, enhancing T2 and T1 hypointense structure within the right adnexal region is indeterminate. It is unclear whether not this is arising from the right ovary, broad ligament or right side of uterine fundus. Differential considerations include fibrothecoma, versus pedunculated uterine leiomyoma. Metastatic disease is considered less favored. Attention on follow-up imaging is recommended.     01/03/2020 -  Chemotherapy   First line THP q3weeks starting 01/03/20    01/05/2020 Imaging   US Abdomen  IMPRESSION: 1. Suspect hematoma in the medial right lobe of  the liver in the area of previous biopsy. Note that recent MR does not show lesion in this area. This area has ill-defined margins and measures 3.9 x 4.0 x 3.5 cm. No perihepatic fluid.   2. Probable hemangiomas elsewhere in the right lobe of the liver. Note that several smaller lesions seen on  recent MR are not appreciable by ultrasound.   3.  Study otherwise unremarkable.   01/12/2020 Pathology Results   FINAL MICROSCOPIC DIAGNOSIS:   A. BONE, SCLEROTIC LESION, LEFT ANTERIOR ILIAC SPINE, BIOPSY:  - Metastatic carcinoma, consistent with patient's clinical history of  primary breast carcinoma  - See comment      COMMENT:   Immunohistochemical stains show that the tumor cells are positive for  CK7 and GATA3; and negative for CK20, consistent with above  interpretation.    PROGNOSTIC INDICATOR RESULTS:   Immunohistochemical and morphometric analysis performed manually   The tumor cells are NEGATIVE for Her2 (0); see comment   Estrogen Receptor: NEGATIVE; see comment  Progesterone Receptor: NEGATIVE; see comment       CURRENT THERAPY:  First line chemoTHPq3weeksstarting 01/03/20  INTERVAL HISTORY:  Carolyn Stewart is here for a follow up. She presents to the clinic alone. Tolerated last cycle chemo well, with mild fatigue for first week, recovers well afterwards  No pain, dyspnea or GI issue, appetite and energy level good overall, off work only for first week after chemo  Weight stable   All other systems were reviewed with the patient and are negative.  MEDICAL HISTORY:  Past Medical History:  Diagnosis Date   Cancer ([place]) 2021   Hypertension     SURGICAL HISTORY: Past Surgical History:  Procedure Laterality Date   left parotid tumor removal   2006   PORTACATH PLACEMENT Left 12/15/2019   Procedure: INSERTION PORT-A-CATH WITH ULTRASOUND GUIDANCE;  Surgeon: [name], MD;  Location: [facility];  Service: General;  Laterality: Left;   TONSILLECTOMY      I have reviewed the social history and family history with the patient and they are unchanged from previous note.  ALLERGIES:  has No Known Allergies.  MEDICATIONS:  Current Outpatient Medications  Medication Sig Dispense Refill   acetaminophen (TYLENOL) 500 MG tablet Take 1,000 mg by  mouth every 6 (six) hours as needed for moderate pain or headache.     amLODipine (NORVASC) 10 MG tablet Take 10 mg by mouth daily.     Cholecalciferol (VITAMIN D3) 50 MCG (2000 UT) TABS Take 2,000 Units by mouth daily.      dexamethasone (DECADRON) 4 MG tablet Take 2 tablets (8 mg total) by mouth 2 (two) times daily. Start the day before Taxotere. Then take daily x 3 days after chemotherapy. (Patient not taking: Reported on 01/22/2020) 30 tablet 1   KLOR-CON M20 20 MEQ tablet TAKE 1 TABLET BY MOUTH EVERY DAY 90 tablet 1   lidocaine-prilocaine (EMLA) cream Apply to affected area once (Patient not taking: Reported on 01/22/2020) 30 g 3   metoprolol succinate (TOPROL-XL) 50 MG 24 hr tablet Take 50 mg by mouth daily.     omeprazole (PRILOSEC OTC) 20 MG tablet Take 20 mg by mouth daily.     ondansetron (ZOFRAN) 8 MG tablet Take 1 tablet (8 mg total) by mouth 2 (two) times daily as needed (Nausea or vomiting). Start on the third day after chemotherapy. (Patient not taking: Reported on 01/22/2020) 30 tablet 1   oxyCODONE (OXY IR/ROXICODONE) 5 MG immediate release tablet Take 1 tablet (5  mg total) by mouth every 6 (six) hours as needed for severe pain. (Patient not taking: Reported on 12/30/2019) 10 tablet 0   prochlorperazine (COMPAZINE) 10 MG tablet Take 1 tablet (10 mg total) by mouth every 6 (six) hours as needed (Nausea or vomiting). (Patient not taking: Reported on 01/22/2020) 30 tablet 1   No current facility-administered medications for this visit.   Facility-Administered Medications Ordered in Other Visits  Medication Dose Route Frequency Provider Last Rate Last Admin   dexamethasone (DECADRON) 10 mg in sodium chloride 0.9 % 50 mL IVPB  10 mg Intravenous Once Truitt Merle, MD 204 mL/hr at 03/25/20 0922 10 mg at 03/25/20 0076   DOCEtaxel (TAXOTERE) 140 mg in sodium chloride 0.9 % 250 mL chemo infusion  75 mg/m2 (Order-Specific) Intravenous Once Truitt Merle, MD       heparin lock flush 100  unit/mL  500 Units Intracatheter Once PRN Truitt Merle, MD       pertuzumab (PERJETA) 420 mg in sodium chloride 0.9 % 250 mL chemo infusion  420 mg Intravenous Once Truitt Merle, MD       sodium chloride flush (NS) 0.9 % injection 10 mL  10 mL Intracatheter PRN Truitt Merle, MD       trastuzumab-anns [facility]) 525 mg in sodium chloride 0.9 % 250 mL chemo infusion  6 mg/kg (Order-Specific) Intravenous Once Truitt Merle, MD        PHYSICAL EXAMINATION: ECOG PERFORMANCE STATUS: 1 - Symptomatic but completely ambulatory  Vitals:   03/25/20 0830  BP: 128/80  Pulse: 99  Resp: 16  Temp: 98.2 F (36.8 C)  SpO2: 100%   Filed Weights   03/25/20 0830  Weight: 198 lb 1.6 oz (89.9 kg)    GENERAL:alert, no distress and comfortable SKIN: skin color, texture, turgor are normal, no rashes or significant lesions EYES: normal, Conjunctiva are pink and non-injected, sclera clear NECK: supple, thyroid normal size, non-tender, without nodularity LYMPH:  no palpable lymphadenopathy in the cervical, axillary  LUNGS: clear to auscultation and percussion with normal breathing effort HEART: regular rate & rhythm and no murmurs and no lower extremity edema ABDOMEN:abdomen soft, non-tender and normal bowel sounds Musculoskeletal:no cyanosis of digits and no clubbing  NEURO: alert & oriented x 3 with fluent speech, no focal motor/sensory deficits  LABORATORY DATA:  I have reviewed the data as listed CBC Latest Ref Rng & Units 03/25/2020 03/04/2020 02/12/2020  WBC 4.0 - 10.5 K/uL 18.2(H) 7.3 8.3  Hemoglobin 12.0 - 15.0 g/dL 10.8(L) 10.5(L) 10.4(L)  Hematocrit 36 - 46 % 34.0(L) 33.9(L) 32.8(L)  Platelets 150 - 400 K/uL 388 391 342     CMP Latest Ref Rng & Units 03/25/2020 03/04/2020 02/12/2020  Glucose 70 - 99 mg/dL 106(H) 80 103(H)  BUN 6 - 20 mg/dL '20 12 11  ' Creatinine 0.44 - 1.00 mg/dL 1.06(H) 0.81 0.84  Sodium 135 - 145 mmol/L 143 145 143  Potassium 3.5 - 5.1 mmol/L 3.5 3.6 3.3(L)  Chloride 98 - 111  mmol/L 111 111 110  CO2 22 - 32 mmol/L '24 26 27  ' Calcium 8.9 - 10.3 mg/dL 9.4 8.9 9.4  Total Protein 6.5 - 8.1 g/dL 6.7 5.9(L) 6.4(L)  Total Bilirubin 0.3 - 1.2 mg/dL 0.3 0.3 0.2(L)  Alkaline Phos 38 - 126 U/L 88 101 126  AST 15 - 41 U/L 14(L) 17 14(L)  ALT 0 - 44 U/L '8 11 9      ' RADIOGRAPHIC STUDIES: I have personally reviewed the radiological images as listed and  agreed with the findings in the report. CT CHEST ABDOMEN PELVIS W CONTRAST  Result Date: 03/24/2020 CLINICAL DATA:  Breast cancer.  Restaging. EXAM: CT CHEST, ABDOMEN, AND PELVIS WITH CONTRAST TECHNIQUE: Multidetector CT imaging of the chest, abdomen and pelvis was performed following the standard protocol during bolus administration of intravenous contrast. CONTRAST:  131m OMNIPAQUE IOHEXOL 300 MG/ML  SOLN COMPARISON:  12/11/2019 FINDINGS: CT CHEST FINDINGS Cardiovascular: The heart size is normal. No substantial pericardial effusion. Left Port-A-Cath tip is positioned in the distal SVC near the junction with the RA. Mediastinum/Nodes: Posterior left 5 mm thyroid nodule evident. Not clinically significant; no follow-up imaging recommended (ref: J Am Coll Radiol. 2015 Feb;12(2): 143-50). No mediastinal lymphadenopathy. There is no hilar lymphadenopathy. The esophagus has normal imaging features. There is no axillary lymphadenopathy. No subpectoral or supraclavicular lymphadenopathy evident. Lungs/Pleura: 5 mm perifissural right lower lobe nodule on 70/4 was 4 mm previously (remeasured). No new suspicious nodule or mass. No focal airspace consolidation. No pleural effusion. Musculoskeletal: Small sclerotic lesion in the posterior T7 vertebral body is similar to prior. 12 mm lucent lesion in the right T12 vertebral body shows interval development of a sclerotic rim. CT ABDOMEN PELVIS FINDINGS Hepatobiliary: Scattered tiny hypodensities are again noted in the liver parenchyma. Segment VI lesion previously measured at 2.4 cm is 2.3 cm today.  1.4 cm ill-defined low-density lesion in segment IV, adjacent to the gallbladder fossa has increased from 5 mm previously. Ill-defined 5 mm lesion in the dome of the lateral segment left liver on 45/2 was 10 mm previously. Gallbladder is nondistended. No intrahepatic or extrahepatic biliary dilation. Pancreas: No focal mass lesion. No dilatation of the main duct. No intraparenchymal cyst. No peripancreatic edema. Spleen: No splenomegaly. No focal mass lesion. Adrenals/Urinary Tract: No adrenal nodule or mass. Kidneys unremarkable. No evidence for hydroureter. The urinary bladder appears normal for the degree of distention. Stomach/Bowel: Stomach is unremarkable. No gastric wall thickening. No evidence of outlet obstruction. Duodenum is normally positioned as is the ligament of Treitz. No small bowel wall thickening. No small bowel dilatation. The terminal ileum is normal. The appendix is normal. No gross colonic mass. No colonic wall thickening. Vascular/Lymphatic: There is abdominal aortic atherosclerosis without aneurysm. There is no gastrohepatic or hepatoduodenal ligament lymphadenopathy. No retroperitoneal or mesenteric lymphadenopathy. No pelvic sidewall lymphadenopathy. Reproductive: Fibroid change noted in the uterus including exophytic right fundal lesion. There is no adnexal mass. Other: New small fluid collection identified right cul-de-sac. Musculoskeletal: Tiny umbilical hernia contains only fat. 2.1 cm lesion anterior left iliac crest has become more sclerotic in the interval (89/2). 2.9 cm lucent lesion with irregular margins in the L3 vertebral body was 2.1 cm previously (remeasured). Is possible this represents a large Schmorl's node or superior endplate focal compression fracture, but metastatic disease not excluded. IMPRESSION: 1. Right lower lobe perifissural nodule is slightly more conspicuous on today's study. Close attention on follow-up recommended. 2. Interval progression of segment IV liver  lesion, concerning for metastatic progression. The remaining visualized liver lesions are stable to smaller in the interval. 3. Interval evolution in appearance of thoracolumbar spinal lesions and left iliac crest lesion, potentially reflecting response to therapy/healing. No definite new osseous lesions on today's study. 4. New small fluid collection right cul-de-sac. 5. Probable fibroid change in the uterus including exophytic right fundal lesion. 6. Aortic Atherosclerosis (ICD10-I70.0). Electronically Signed   By: EMisty StanleyM.D.   On: 03/24/2020 09:52     ASSESSMENT & PLAN:  KSamariah Hokensonis  a 59 y.o. female with    1.Malignant neoplasm of upper-outer quadrant of right breast,ductal carcinoma,StageIIA, c(T3N1Mx),with bone and possible liver mets,ER+/PR+/HER2+,GradeII-III -She was diagnosed in 11/2019 witha largeright breast mass in 4 quadrants measuringup to 11cm and right lymphadenopathy as well as indeterminate masses in left breast and one enlarged left axillar LN. Her right breast and LN biopsy was positive for invasiveductalcarcinoma with components of DCIS, calcifications and necrosis in the breast. Her left axillarynodebiopsy was benign. -01/01/20 Liver biopsy was negativefor malignant cellsbut her 01/12/20 Bone Biopsy was positive for metastatic carcinoma from primary breast cancer. Her ER/PR/HER2 were all negative bone biopsy, possibly related to decalcification. -She was also seen to have left breast mass on her 12/12/19 MRI, but given metastatic disease, no furhter biopsy is needed due to her metastatic disease.  -We previously discussed that she has stage IV metastatic breast cancer which is not curable but very treatable. We do not plan toofferright breast surgery in the near future, but may be an option down the road. -I started her on first line THP every 3 weeks on 01/03/20. Based on response, will consider switch to maintenance therapy with Herceptin, Perjeta and  AI after 6-8 cycle chemo THP  -I personally reviewed and discussed her CT CAP from 03/23/20 which showed more sclerotic appearance of her bone lesions, likely treatment response, slightly enlarged liver lesion in segment IV, others are stable. Her liver lesions were indeterminate on previous CT and MRI, some are hemangioma based on MRI.  I spoke with radiologist Dr. Tery Sanfilippo, and we recommend repeat that liver MRI for further evaluation.  I will also get a bone scan -Continue TCH, lab reviewed, adequate for treatment today. -She is due for echocardiogram, ordered today -Follow-up in 3 weeks before next cycle chemo   2. Diarrhea, Constipation, and vaginal prolaps -The patient has been experiencing constipation following her first cycle of THP. With 2 bottles of MiraLAX on 9/16/21she ended up causing significant diarrhea. -Her 01/09/20 ED visit indicated diarrhea as well as vaginal prolapse.The patient's diarrhea has since resolved.  -Constipation education was given to the patient.If she needs to use a laxative in the future, encouraged her to try to use half a bottle initially. -She will follow up with her OB/GYN regarding the vaginal prolapse.She was seen byDr. Rip Harbour.  She plans to have hysterectomy when she is off chemo. -Her bowel movement has been normal lately, no recurrent vaginal prolapse  3. HTN -Well controlled on Metoprolol andAmlodipine. Continue to f/u with PCP  4.Left foot injury  -Seen on 02/12/20 Xray.  -resolved now   5. Hypokalemia -Continue 20 mg every other day  6. Goal of care discussion  -she is full code now    PLAN: -scan reviewed, will get abd MRI w wo contrast and bone scan for further evaluation, I reviewed with radiology  -Lab reviewed, adequate for treatment, will proceed to cycle 5 THP at same dose, no GCSF -Follow-up in 3 weeks for cycle 6 chemo   No problem-specific Assessment & Plan notes found for this encounter.   Orders Placed  This Encounter  Procedures   MR Abdomen W Wo Contrast    Evaluate liver lesions, please compare to her last MRI and recent CT    Standing Status:   Future    Standing Expiration Date:   03/25/2021    Order Specific Question:   If indicated for the ordered procedure, I authorize the administration of contrast media per Radiology protocol    Answer:  Yes    Order Specific Question:   What is the patient's sedation requirement?    Answer:   No Sedation    Order Specific Question:   Does the patient have a pacemaker or implanted devices?    Answer:   No    Order Specific Question:   Preferred imaging location?    Answer:   [facility] (table limit - 550 lbs)   NM Bone Scan Whole Body    Standing Status:   Future    Standing Expiration Date:   03/25/2021    Order Specific Question:   If indicated for the ordered procedure, I authorize the administration of a radiopharmaceutical per Radiology protocol    Answer:   Yes    Order Specific Question:   Is the patient pregnant?    Answer:   No    Order Specific Question:   Preferred imaging location?    Answer:   [facility]   ECHOCARDIOGRAM COMPLETE    Standing Status:   Future    Standing Expiration Date:   03/25/2021    Order Specific Question:   Where should this test be performed    Answer:   [place]    Order Specific Question:   Perflutren DEFINITY (image enhancing agent) should be administered unless hypersensitivity or allergy exist    Answer:   Administer Perflutren    Order Specific Question:   Is a special reader required? (athlete or structural heart)    Answer:   Yes    Order Specific Question:   Reason for exam-Echo    Answer:   Chemo  V67.2 / Z09   All questions were answered. The patient knows to call the clinic with any problems, questions or concerns. No barriers to learning was detected. The total time spent in the appointment was 40 minutes.     Truitt Merle, MD 03/25/2020   I, Joslyn Devon, am  acting as scribe for Truitt Merle, MD.   I have reviewed the above documentation for accuracy and completeness, and I agree with the above.

## 2020-03-25 ENCOUNTER — Inpatient Hospital Stay: Payer: Commercial Managed Care - PPO | Attending: Hematology

## 2020-03-25 ENCOUNTER — Inpatient Hospital Stay (HOSPITAL_BASED_OUTPATIENT_CLINIC_OR_DEPARTMENT_OTHER): Payer: Commercial Managed Care - PPO | Admitting: Hematology

## 2020-03-25 ENCOUNTER — Encounter: Payer: Self-pay | Admitting: *Deleted

## 2020-03-25 ENCOUNTER — Inpatient Hospital Stay: Payer: Commercial Managed Care - PPO

## 2020-03-25 ENCOUNTER — Encounter: Payer: Self-pay | Admitting: Hematology

## 2020-03-25 ENCOUNTER — Other Ambulatory Visit: Payer: Self-pay

## 2020-03-25 VITALS — BP 134/79 | HR 80 | Temp 98.2°F | Resp 16

## 2020-03-25 VITALS — BP 128/80 | HR 99 | Temp 98.2°F | Resp 16 | Ht 67.0 in | Wt 198.1 lb

## 2020-03-25 DIAGNOSIS — C50411 Malignant neoplasm of upper-outer quadrant of right female breast: Secondary | ICD-10-CM

## 2020-03-25 DIAGNOSIS — Z5112 Encounter for antineoplastic immunotherapy: Secondary | ICD-10-CM | POA: Insufficient documentation

## 2020-03-25 DIAGNOSIS — I10 Essential (primary) hypertension: Secondary | ICD-10-CM | POA: Insufficient documentation

## 2020-03-25 DIAGNOSIS — C787 Secondary malignant neoplasm of liver and intrahepatic bile duct: Secondary | ICD-10-CM | POA: Diagnosis not present

## 2020-03-25 DIAGNOSIS — C773 Secondary and unspecified malignant neoplasm of axilla and upper limb lymph nodes: Secondary | ICD-10-CM | POA: Diagnosis not present

## 2020-03-25 DIAGNOSIS — Z79899 Other long term (current) drug therapy: Secondary | ICD-10-CM | POA: Diagnosis not present

## 2020-03-25 DIAGNOSIS — E876 Hypokalemia: Secondary | ICD-10-CM | POA: Insufficient documentation

## 2020-03-25 DIAGNOSIS — C7951 Secondary malignant neoplasm of bone: Secondary | ICD-10-CM | POA: Insufficient documentation

## 2020-03-25 DIAGNOSIS — R197 Diarrhea, unspecified: Secondary | ICD-10-CM | POA: Insufficient documentation

## 2020-03-25 DIAGNOSIS — Z170 Estrogen receptor positive status [ER+]: Secondary | ICD-10-CM | POA: Insufficient documentation

## 2020-03-25 DIAGNOSIS — Z5111 Encounter for antineoplastic chemotherapy: Secondary | ICD-10-CM | POA: Insufficient documentation

## 2020-03-25 DIAGNOSIS — K5900 Constipation, unspecified: Secondary | ICD-10-CM | POA: Insufficient documentation

## 2020-03-25 LAB — CBC WITH DIFFERENTIAL (CANCER CENTER ONLY)
Abs Immature Granulocytes: 0.25 10*3/uL — ABNORMAL HIGH (ref 0.00–0.07)
Basophils Absolute: 0.1 10*3/uL (ref 0.0–0.1)
Basophils Relative: 0 %
Eosinophils Absolute: 0 10*3/uL (ref 0.0–0.5)
Eosinophils Relative: 0 %
HCT: 34 % — ABNORMAL LOW (ref 36.0–46.0)
Hemoglobin: 10.8 g/dL — ABNORMAL LOW (ref 12.0–15.0)
Immature Granulocytes: 1 %
Lymphocytes Relative: 19 %
Lymphs Abs: 3.4 10*3/uL (ref 0.7–4.0)
MCH: 28.6 pg (ref 26.0–34.0)
MCHC: 31.8 g/dL (ref 30.0–36.0)
MCV: 90.2 fL (ref 80.0–100.0)
Monocytes Absolute: 1.5 10*3/uL — ABNORMAL HIGH (ref 0.1–1.0)
Monocytes Relative: 8 %
Neutro Abs: 13 10*3/uL — ABNORMAL HIGH (ref 1.7–7.7)
Neutrophils Relative %: 72 %
Platelet Count: 388 10*3/uL (ref 150–400)
RBC: 3.77 MIL/uL — ABNORMAL LOW (ref 3.87–5.11)
RDW: 15.9 % — ABNORMAL HIGH (ref 11.5–15.5)
WBC Count: 18.2 10*3/uL — ABNORMAL HIGH (ref 4.0–10.5)
nRBC: 0 % (ref 0.0–0.2)

## 2020-03-25 LAB — CMP (CANCER CENTER ONLY)
ALT: 8 U/L (ref 0–44)
AST: 14 U/L — ABNORMAL LOW (ref 15–41)
Albumin: 3.7 g/dL (ref 3.5–5.0)
Alkaline Phosphatase: 88 U/L (ref 38–126)
Anion gap: 8 (ref 5–15)
BUN: 20 mg/dL (ref 6–20)
CO2: 24 mmol/L (ref 22–32)
Calcium: 9.4 mg/dL (ref 8.9–10.3)
Chloride: 111 mmol/L (ref 98–111)
Creatinine: 1.06 mg/dL — ABNORMAL HIGH (ref 0.44–1.00)
GFR, Estimated: >60 mL/min (ref >60)
Glucose, Bld: 106 mg/dL — ABNORMAL HIGH (ref 70–99)
Potassium: 3.5 mmol/L (ref 3.5–5.1)
Sodium: 143 mmol/L (ref 135–145)
Total Bilirubin: 0.3 mg/dL (ref 0.3–1.2)
Total Protein: 6.7 g/dL (ref 6.5–8.1)

## 2020-03-25 MED ORDER — HEPARIN SOD (PORK) LOCK FLUSH 100 UNIT/ML IV SOLN
500.0000 [IU] | Freq: Once | INTRAVENOUS | Status: AC | PRN
  Administered 2020-03-25: 500 [IU]
  Filled 2020-03-25: qty 5

## 2020-03-25 MED ORDER — SODIUM CHLORIDE 0.9 % IV SOLN
10.0000 mg | Freq: Once | INTRAVENOUS | Status: AC
  Administered 2020-03-25: 10 mg via INTRAVENOUS
  Filled 2020-03-25: qty 10

## 2020-03-25 MED ORDER — SODIUM CHLORIDE 0.9% FLUSH
10.0000 mL | Freq: Once | INTRAVENOUS | Status: AC
  Administered 2020-03-25: 10 mL
  Filled 2020-03-25: qty 10

## 2020-03-25 MED ORDER — DIPHENHYDRAMINE HCL 25 MG PO CAPS
50.0000 mg | ORAL_CAPSULE | Freq: Once | ORAL | Status: AC
  Administered 2020-03-25: 50 mg via ORAL

## 2020-03-25 MED ORDER — ACETAMINOPHEN 325 MG PO TABS
ORAL_TABLET | ORAL | Status: AC
  Filled 2020-03-25: qty 2

## 2020-03-25 MED ORDER — SODIUM CHLORIDE 0.9 % IV SOLN
420.0000 mg | Freq: Once | INTRAVENOUS | Status: AC
  Administered 2020-03-25: 420 mg via INTRAVENOUS
  Filled 2020-03-25: qty 14

## 2020-03-25 MED ORDER — ACETAMINOPHEN 325 MG PO TABS
650.0000 mg | ORAL_TABLET | Freq: Once | ORAL | Status: AC
  Administered 2020-03-25: 650 mg via ORAL

## 2020-03-25 MED ORDER — SODIUM CHLORIDE 0.9 % IV SOLN
75.0000 mg/m2 | Freq: Once | INTRAVENOUS | Status: AC
  Administered 2020-03-25: 140 mg via INTRAVENOUS
  Filled 2020-03-25: qty 14

## 2020-03-25 MED ORDER — PALONOSETRON HCL INJECTION 0.25 MG/5ML
INTRAVENOUS | Status: AC
  Filled 2020-03-25: qty 5

## 2020-03-25 MED ORDER — SODIUM CHLORIDE 0.9% FLUSH
10.0000 mL | INTRAVENOUS | Status: DC | PRN
  Administered 2020-03-25: 10 mL
  Filled 2020-03-25: qty 10

## 2020-03-25 MED ORDER — TRASTUZUMAB-ANNS CHEMO 150 MG IV SOLR
6.0000 mg/kg | Freq: Once | INTRAVENOUS | Status: AC
  Administered 2020-03-25: 525 mg via INTRAVENOUS
  Filled 2020-03-25: qty 25

## 2020-03-25 MED ORDER — PALONOSETRON HCL INJECTION 0.25 MG/5ML
0.2500 mg | Freq: Once | INTRAVENOUS | Status: AC
  Administered 2020-03-25: 0.25 mg via INTRAVENOUS

## 2020-03-25 MED ORDER — DIPHENHYDRAMINE HCL 25 MG PO CAPS
ORAL_CAPSULE | ORAL | Status: AC
  Filled 2020-03-25: qty 2

## 2020-03-25 MED ORDER — SODIUM CHLORIDE 0.9 % IV SOLN
Freq: Once | INTRAVENOUS | Status: AC
  Filled 2020-03-25: qty 250

## 2020-03-25 NOTE — Progress Notes (Signed)
Per Dr. Burr Medico, ok to proceed with treatment with echo from 12/16/19. Patient to be scheduled for echo prior to next treatment.    Patient stable upon discharge from infusion room.

## 2020-03-25 NOTE — Patient Instructions (Signed)
[facility] Discharge Instructions for Patients Receiving Chemotherapy  Today you received the following chemotherapy agents Trastuzumab-anns (KANJINTI), Pertuzumab (PERJETA) & Docetaxel (TAXOTERE).  To help prevent nausea and vomiting after your treatment, we encourage you to take your nausea medication as prescribed.   If you develop nausea and vomiting that is not controlled by your nausea medication, call the clinic.   BELOW ARE SYMPTOMS THAT SHOULD BE REPORTED IMMEDIATELY:  *FEVER GREATER THAN 100.5 F  *CHILLS WITH OR WITHOUT FEVER  NAUSEA AND VOMITING THAT IS NOT CONTROLLED WITH YOUR NAUSEA MEDICATION  *UNUSUAL SHORTNESS OF BREATH  *UNUSUAL BRUISING OR BLEEDING  TENDERNESS IN MOUTH AND THROAT WITH OR WITHOUT PRESENCE OF ULCERS  *URINARY PROBLEMS  *BOWEL PROBLEMS  UNUSUAL RASH Items with * indicate a potential emergency and should be followed up as soon as possible.  Feel free to call the clinic should you have any questions or concerns. The clinic phone number is [phone].  Please show the CHEMO ALERT CARD at check-in to the Emergency Department and triage nurse.   

## 2020-03-26 DIAGNOSIS — I10 Essential (primary) hypertension: Secondary | ICD-10-CM | POA: Insufficient documentation

## 2020-03-26 LAB — CANCER ANTIGEN 27.29: CA 27.29: 34.1 U/mL (ref 0.0–38.6)

## 2020-03-31 ENCOUNTER — Other Ambulatory Visit: Payer: Self-pay

## 2020-03-31 ENCOUNTER — Other Ambulatory Visit: Payer: Commercial Managed Care - PPO

## 2020-03-31 ENCOUNTER — Ambulatory Visit (HOSPITAL_COMMUNITY)
Admission: RE | Admit: 2020-03-31 | Discharge: 2020-03-31 | Disposition: A | Discharge status: Home / Self Care | Payer: Commercial Managed Care - PPO | Source: Ambulatory Visit | Attending: Hematology | Admitting: Hematology

## 2020-03-31 ENCOUNTER — Ambulatory Visit (HOSPITAL_BASED_OUTPATIENT_CLINIC_OR_DEPARTMENT_OTHER)
Admission: RE | Admit: 2020-03-31 | Discharge: 2020-03-31 | Disposition: A | Discharge status: Home / Self Care | Payer: Commercial Managed Care - PPO | Source: Ambulatory Visit | Attending: Hematology | Admitting: Hematology

## 2020-03-31 DIAGNOSIS — C50411 Malignant neoplasm of upper-outer quadrant of right female breast: Secondary | ICD-10-CM | POA: Insufficient documentation

## 2020-03-31 DIAGNOSIS — Z170 Estrogen receptor positive status [ER+]: Secondary | ICD-10-CM

## 2020-03-31 DIAGNOSIS — C787 Secondary malignant neoplasm of liver and intrahepatic bile duct: Secondary | ICD-10-CM | POA: Insufficient documentation

## 2020-03-31 DIAGNOSIS — Z01810 Encounter for preprocedural cardiovascular examination: Secondary | ICD-10-CM | POA: Insufficient documentation

## 2020-03-31 DIAGNOSIS — Z0189 Encounter for other specified special examinations: Secondary | ICD-10-CM | POA: Diagnosis not present

## 2020-03-31 DIAGNOSIS — I10 Essential (primary) hypertension: Secondary | ICD-10-CM | POA: Insufficient documentation

## 2020-03-31 DIAGNOSIS — I351 Nonrheumatic aortic (valve) insufficiency: Secondary | ICD-10-CM | POA: Insufficient documentation

## 2020-03-31 LAB — ECHOCARDIOGRAM COMPLETE
Area-P 1/2: 3.08 cm2
Calc EF: 50.7 %
S' Lateral: 3.5 cm
Single Plane A2C EF: 49.5 %
Single Plane A4C EF: 49.5 %

## 2020-03-31 MED ORDER — GADOBUTROL 1 MMOL/ML IV SOLN
9.0000 mL | Freq: Once | INTRAVENOUS | Status: AC | PRN
  Administered 2020-03-31: 9 mL via INTRAVENOUS

## 2020-03-31 NOTE — Progress Notes (Signed)
  Echocardiogram 2D Echocardiogram has been performed.  Michiel Cowboy 03/31/2020, 10:41 AM

## 2020-04-01 ENCOUNTER — Encounter (HOSPITAL_COMMUNITY)
Admission: RE | Admit: 2020-04-01 | Discharge: 2020-04-01 | Disposition: A | Discharge status: Home / Self Care | Payer: Commercial Managed Care - PPO | Source: Ambulatory Visit | Attending: Hematology | Admitting: Hematology

## 2020-04-01 DIAGNOSIS — Z170 Estrogen receptor positive status [ER+]: Secondary | ICD-10-CM | POA: Insufficient documentation

## 2020-04-01 DIAGNOSIS — C50411 Malignant neoplasm of upper-outer quadrant of right female breast: Secondary | ICD-10-CM | POA: Insufficient documentation

## 2020-04-01 MED ORDER — TECHNETIUM TC 99M MEDRONATE IV KIT
19.5000 | PACK | Freq: Once | INTRAVENOUS | Status: AC
  Administered 2020-04-01: 19.5 via INTRAVENOUS

## 2020-04-08 ENCOUNTER — Encounter: Payer: Self-pay | Admitting: *Deleted

## 2020-04-10 ENCOUNTER — Other Ambulatory Visit: Payer: Self-pay | Admitting: Hematology

## 2020-04-14 ENCOUNTER — Other Ambulatory Visit: Payer: Self-pay

## 2020-04-14 NOTE — Progress Notes (Signed)
The proposed treatment discussed in conference is for discussion purposes only and is not a binding recommendation.  The patients have not been physically examined, or presented with their treatment options.  Therefore, final treatment plans cannot be decided.   

## 2020-04-14 NOTE — Progress Notes (Addendum)
Carolyn Stewart   [phone] [phone]   Clinic Follow up Note   Patient Care Team: Julian Hy, PA-C as PCP - General (Physician Assistant) [name], MD as Consulting Physician (General Surgery) Truitt Merle, MD as Consulting Physician (Hematology) Kyung Rudd, MD as Consulting Physician (Radiation Oncology) Mauro Kaufmann, RN as Oncology Nurse Navigator Rockwell Germany, RN as Oncology Nurse Navigator 04/15/2020  CHIEF COMPLAINT: Follow up right breast cancer   SUMMARY OF ONCOLOGIC HISTORY: Oncology History Overview Note  Cancer Staging Malignant neoplasm of upper-outer quadrant of right breast in female, estrogen receptor positive ([place]) Staging form: Breast, AJCC 8th Edition - Clinical stage from 11/25/2019: Stage IIA (cT3, cN1, cM0, G2, ER+, PR+, HER2+) - Signed by Truitt Merle, MD on 12/03/2019    Malignant neoplasm of upper-outer quadrant of right breast in female, estrogen receptor positive ([place])  11/13/2019 Mammogram    Diagnostic Mammogram 11/13/19  IMPRESSION: -Highly suspicious mass and calcifications in the right breast centered between 9 and 12 o'clock spanning up to 11 cm.  -Multiple masses in the right axilla. One of the masses contains calcifications, denoted as mass number 2 measuring 9 x 11 by 10 mm. Several of these masses do not appear to represent definitive lymph nodes. Others may be small but abnormal appearing lymph nodes. Taken in total, a cluster of masses in the right axilla spans up to 3.7 cm.  -There are fibrocystic changes on the left. There are also some solid-appearing masses. A solid appearing mass at 10:30, 6 cm from the nipple measures 9 x 6 x 6 mm. An adjacent solid mass at 10 o'clock, 6 cm from the nipple measures 8 x 6 by 5 mm. Another solid-appearing mass at 11 o'clock, 1 cm from the nipple measures 5 x 4 by 5 mm. Fibrocystic changes are seen at 10 o'clock, 12 o'clock, and 4 o'clock. Another  solid-appearing mass seen at 5 o'clock, 4 cm from the nipple measuring 6 x 3 x 4 mm. There is a single abnormal node in the left axilla with a cortex measuring 5 mm and restriction of the fatty hilum.   11/25/2019 Initial Biopsy   Diagnosis 1. Breast, right, needle core biopsy, upper outer - INVASIVE MAMMARY CARCINOMA - MAMMARY CARCINOMA IN SITU WITH CALCIFICATIONS AND NECROSIS - SEE COMMENT 2. Lymph node, needle/core biopsy, right axilla - INVASIVE MAMMARY CARCINOMA - SEE COMMENT 3. Lymph node, needle/core biopsy, left axilla - BENIGN LYMPH NODE - NO CARCINOMA IDENTIFIED Microscopic Comment 1. The biopsy material shows an infiltrative proliferation of cells arranged linearly and in small clusters. Based on the biopsy, the carcinoma appears Nottingham grade 2 of 3 and measures 1.2 cm in greatest linear extent. E-cadherin and prognostic markers (ER/PR/ki-67/HER2)are pending and will be reported in an addendum. Dr. Tresa Moore reviewed the case and agrees with the above diagnosis. These results were called to [facility] on November 26, 2019. 2. Definitive nodal tissue is not identified. This biopsy has a slightly different architecture morphology than what is seen in part 1. The carcinoma measures 0.7 cm in greatest dimension. E-cadherin and prognostic markers (ER, PR, Ki-67 and HER-2) are pending and will be reported in an addendum.   11/25/2019 Receptors her2   1. PROGNOSTIC INDICATORS Results: IMMUNOHISTOCHEMICAL AND MORPHOMETRIC ANALYSIS PERFORMED MANUALLY The tumor cells are POSITIVE for Her2 (3+). Estrogen Receptor: 90%, POSITIVE, STRONG STAINING INTENSITY Progesterone Receptor: 20%, POSITIVE, STRONG STAINING INTENSITY Proliferation Marker Ki67: 30%  2. PROGNOSTIC INDICATORS Results: IMMUNOHISTOCHEMICAL  AND MORPHOMETRIC ANALYSIS PERFORMED MANUALLY The tumor cells are POSITIVE for Her2 (3+). Estrogen Receptor: 90%, POSITIVE, STRONG STAINING INTENSITY Progesterone  Receptor: 25%, POSITIVE, STRONG STAINING INTENSITY Proliferation Marker Ki67: 30%   11/25/2019 Cancer Staging   Staging form: Breast, AJCC 8th Edition - Clinical stage from 11/25/2019: Stage IIA (cT3, cN1, cM0, G2, ER+, PR+, HER2+) - Signed by Truitt Merle, MD on 12/03/2019   12/01/2019 Initial Diagnosis   Malignant neoplasm of upper-outer quadrant of right breast in female, estrogen receptor positive ([place])   12/10/2019 Imaging   Bone Scan  IMPRESSION: Findings concerning for bony metastatic disease in the left acetabulum region, L3 vertebral body region, and anterolateral right tenth rib.   Increased uptake in several large joints likely is of arthropathic etiology.   12/11/2019 Imaging   CT CAP w contrast  IMPRESSION: Prominent glandular tissue in the central right breast with nipple retraction, likely corresponding to the patient's known right breast neoplasm.   Prominent right axillary nodes measuring up to 8 mm short axis, suspicious for nodal metastases in this clinical context.   3 mm subpleural pulmonary nodule in the right lower lobe, likely benign. However, follow-up CT chest is suggested in 3-6 months.   Multiple hepatic lesions, including a dominant 2.4 cm lesion in segment 6, which is considered indeterminate. Metastasis is not excluded. Consider MRI abdomen with/without contrast for further characterization.   11 mm sclerotic lesion along the left posterior aspect of the T7 vertebral body raises concern for isolated osseous metastasis. Correlate with priors if available. Benign vertebral hemangioma at T11.   12/12/2019 Breast MRI   IMPRESSION: 1. The biopsy proven malignancy in the right breast involves all 4 quadrants and spans up to 9 cm by MRI. There is enhancement within the skin at the level of the nipple.   2. Multiple matted masses are seen in the right axilla, either abnormal metastatic lymph nodes or a combination of masses and metastatic lymph nodes. One  of these has been previously biopsied showing invasive mammary carcinoma without definitive nodal tissue.   3.  There is a 1.5 cm Rotter's lymph node on the right.   4. There is markedly abnormal enhancement and edema in the right pectoralis major muscle spanning 6-7 cm, suspicious for metastatic disease.   5. There is diffuse nodular enhancement throughout the left breast, which may correspond with the solid-appearing masses seen on ultrasound. These all have a similar appearance on MRI.   6.  No findings of left axillary lymphadenopathy.   12/15/2019 Procedure   PAC placed    12/24/2019 Imaging   MRI abdomen  IMPRESSION: 1. Multifocal enhancing bone lesions are noted within the lumbar spine, and bony pelvis compatible with metastatic disease. 2. Four, small peripherally enhancing cystic lesions are noted within the liver worrisome for metastatic disease. 3. 2 enhancing liver lesions are also noted with signal and enhancement characteristics consistent with benign liver hemangioma. 4. Well-circumscribed, enhancing T2 and T1 hypointense structure within the right adnexal region is indeterminate. It is unclear whether not this is arising from the right ovary, broad ligament or right side of uterine fundus. Differential considerations include fibrothecoma, versus pedunculated uterine leiomyoma. Metastatic disease is considered less favored. Attention on follow-up imaging is recommended.     01/03/2020 -  Chemotherapy   First line THP q3weeks starting 01/03/20    01/05/2020 Imaging   US Abdomen  IMPRESSION: 1. Suspect hematoma in the medial right lobe of the liver in the area of previous biopsy.  Note that recent MR does not show lesion in this area. This area has ill-defined margins and measures 3.9 x 4.0 x 3.5 cm. No perihepatic fluid.   2. Probable hemangiomas elsewhere in the right lobe of the liver. Note that several smaller lesions seen on recent MR are not appreciable by  ultrasound.   3.  Study otherwise unremarkable.   01/12/2020 Pathology Results   FINAL MICROSCOPIC DIAGNOSIS:   A. BONE, SCLEROTIC LESION, LEFT ANTERIOR ILIAC SPINE, BIOPSY:  - Metastatic carcinoma, consistent with patient's clinical history of  primary breast carcinoma  - See comment   COMMENT:  Immunohistochemical stains show that the tumor cells are positive for  CK7 and GATA3; and negative for CK20, consistent with above  interpretation.   PROGNOSTIC INDICATOR RESULTS:  The tumor cells are NEGATIVE for Her2 (0); see comment  Estrogen Receptor: NEGATIVE; see comment  Progesterone Receptor: NEGATIVE; see comment    03/23/2020 Imaging   CT CAP IMPRESSION: 1. Right lower lobe perifissural nodule is slightly more conspicuous on today's study. Close attention on follow-up recommended. 2. Interval progression of segment IV liver lesion, concerning for metastatic progression. The remaining visualized liver lesions are stable to smaller in the interval. 3. Interval evolution in appearance of thoracolumbar spinal lesions and left iliac crest lesion, potentially reflecting response to therapy/healing. No definite new osseous lesions on today's study. 4. New small fluid collection right cul-de-sac. 5. Probable fibroid change in the uterus including exophytic right fundal lesion. 6. Aortic Atherosclerosis (ICD10-I70.0).   03/31/2020 Imaging   MR ABD IMPRESSION: 1. Enlarging hepatic lesion in hepatic subsegment IV suspicious for hepatic metastasis. The it is uncertain whether the change in the short interval is due to technical factors with different modalities or true enlargement. 2. Lesion in the posterior RIGHT hepatic lobe with imaging features that are atypical for hemangioma but stability and signs of enhancement on later phase raising this question. Another more classic appearing may angioma seen inferior to this location in hepatic subsegment VI. Attention on follow-up. 3.  Heterogeneous enhancement throughout the spine particularly at L3 as exhibited on the prior study with very patchy marrow signal remains suspicious for bony metastatic lesions in this patient with known bone metastases.   04/01/2020 Imaging   Bone scan IMPRESSION: Good response to therapy with resolution of sites of uptake seen previously at the cervical spine and posterior RIGHT tenth rib as well as a diminished degree of uptake at remaining previously identified osseous metastatic foci.     CURRENT THERAPY: First line chemoTHPq3weeksstarting 01/03/20  INTERVAL HISTORY: Carolyn Stewart returns for follow up and treatment as scheduled. She completed 5 cycles of THP. She has undergone various scans to evaluate her response to treatment.  She tolerates treatment well with occasional headache, diarrhea, and intermittent mild tingling in the fingertips and toes which has been stable on treatment.  She remains completely functional, not dropping things.  Denies any pain.  No fever, chills, cough, chest pain, dyspnea, N/V, or other concerns.   MEDICAL HISTORY:  Past Medical History:  Diagnosis Date  . Cancer ([place]) 2021  . Hypertension     SURGICAL HISTORY: Past Surgical History:  Procedure Laterality Date  . left parotid tumor removal   2006  . PORTACATH PLACEMENT Left 12/15/2019   Procedure: INSERTION PORT-A-CATH WITH ULTRASOUND GUIDANCE;  Surgeon: [name], MD;  Location: [place];  Service: General;  Laterality: Left;  . TONSILLECTOMY      I have reviewed the social history and family  history with the patient and they are unchanged from previous note.  ALLERGIES:  has No Known Allergies.  MEDICATIONS:  Current Outpatient Medications  Medication Sig Dispense Refill  . acetaminophen (TYLENOL) 500 MG tablet Take 1,000 mg by mouth every 6 (six) hours as needed for moderate pain or headache.    Marland Kitchen amLODipine (NORVASC) 10 MG tablet Take 10 mg by mouth daily.    . Cholecalciferol  (VITAMIN D3) 50 MCG (2000 UT) TABS Take 2,000 Units by mouth daily.     Marland Kitchen dexamethasone (DECADRON) 4 MG tablet Take 2 tablets (8 mg total) by mouth 2 (two) times daily. Start the day before Taxotere. Then take daily x 3 days after chemotherapy. (Patient not taking: Reported on 01/22/2020) 30 tablet 1  . KLOR-CON M20 20 MEQ tablet TAKE 1 TABLET BY MOUTH EVERY DAY 90 tablet 1  . lidocaine-prilocaine (EMLA) cream Apply to affected area once (Patient not taking: Reported on 01/22/2020) 30 g 3  . metoprolol succinate (TOPROL-XL) 50 MG 24 hr tablet Take 50 mg by mouth daily.    Marland Kitchen omeprazole (PRILOSEC OTC) 20 MG tablet Take 20 mg by mouth daily.    . ondansetron (ZOFRAN) 8 MG tablet Take 1 tablet (8 mg total) by mouth 2 (two) times daily as needed (Nausea or vomiting). Start on the third day after chemotherapy. (Patient not taking: Reported on 01/22/2020) 30 tablet 1  . oxyCODONE (OXY IR/ROXICODONE) 5 MG immediate release tablet Take 1 tablet (5 mg total) by mouth every 6 (six) hours as needed for severe pain. (Patient not taking: Reported on 12/30/2019) 10 tablet 0  . prochlorperazine (COMPAZINE) 10 MG tablet Take 1 tablet (10 mg total) by mouth every 6 (six) hours as needed (Nausea or vomiting). (Patient not taking: Reported on 01/22/2020) 30 tablet 1   No current facility-administered medications for this visit.   Facility-Administered Medications Ordered in Other Visits  Medication Dose Route Frequency Provider Last Rate Last Admin  . DOCEtaxel (TAXOTERE) 140 mg in sodium chloride 0.9 % 250 mL chemo infusion  75 mg/m2 (Order-Specific) Intravenous Once Truitt Merle, MD      . heparin lock flush 100 unit/mL  500 Units Intracatheter Once PRN Truitt Merle, MD      . pertuzumab (PERJETA) 420 mg in sodium chloride 0.9 % 250 mL chemo infusion  420 mg Intravenous Once Truitt Merle, MD      . sodium chloride flush (NS) 0.9 % injection 10 mL  10 mL Intracatheter PRN Truitt Merle, MD        PHYSICAL EXAMINATION: ECOG  PERFORMANCE STATUS: 1 - Symptomatic but completely ambulatory  Vitals:   04/15/20 0913  BP: (!) 150/88  Pulse: 94  Resp: 18  Temp: 97.8 F (36.6 C)  SpO2: 100%   Filed Weights   04/15/20 0913  Weight: 197 lb 6.4 oz (89.5 kg)    GENERAL:alert, no distress and comfortable SKIN: No rash EYES: sclera clear LYMPH:  no palpable cervical or supraclavicular lymphadenopathy  LUNGS:  normal breathing effort HEART:  no lower extremity edema ABDOMEN:abdomen soft, non-tender and normal bowel sounds NEURO: alert & oriented x 3 with fluent speech PAC without erythema Breast exam: Right breast smaller and softer, no erythema, nipple inversion without discharge.  There is soft tissue density in the right upper quadrant and right central breast without discrete mass.  Axillary adenopathy is difficult to palpate.  Left breast benign  LABORATORY DATA:  I have reviewed the data as listed CBC Latest Ref Rng &  Units 04/15/2020 03/25/2020 03/04/2020  WBC 4.0 - 10.5 K/uL 6.9 18.2(H) 7.3  Hemoglobin 12.0 - 15.0 g/dL 11.4(L) 10.8(L) 10.5(L)  Hematocrit 36.0 - 46.0 % 36.4 34.0(L) 33.9(L)  Platelets 150 - 400 K/uL 323 388 391     CMP Latest Ref Rng & Units 04/15/2020 03/25/2020 03/04/2020  Glucose 70 - 99 mg/dL 101(H) 106(H) 80  BUN 6 - 20 mg/dL '13 20 12  ' Creatinine 0.44 - 1.00 mg/dL 0.88 1.06(H) 0.81  Sodium 135 - 145 mmol/L 142 143 145  Potassium 3.5 - 5.1 mmol/L 3.7 3.5 3.6  Chloride 98 - 111 mmol/L 111 111 111  CO2 22 - 32 mmol/L '26 24 26  ' Calcium 8.9 - 10.3 mg/dL 9.0 9.4 8.9  Total Protein 6.5 - 8.1 g/dL 6.3(L) 6.7 5.9(L)  Total Bilirubin 0.3 - 1.2 mg/dL 0.2(L) 0.3 0.3  Alkaline Phos 38 - 126 U/L 91 88 101  AST 15 - 41 U/L 21 14(L) 17  ALT 0 - 44 U/L '10 8 11      ' RADIOGRAPHIC STUDIES: I have personally reviewed the radiological images as listed and agreed with the findings in the report. No results found.   ASSESSMENT & PLAN: Carolyn Stewart is a 59 y.o. female with     1.Malignant neoplasm of upper-outer quadrant of right breast,ductal carcinoma,StageIIA, c(T3N1Mx),with bone and possible liver mets,ER+/PR+/HER2+,GradeII-III -Diagnosed in 11/2019 witha largeright breast mass in 4 quadrants measuringup to 11cm and right lymphadenopathy as well as indeterminate masses in left breast and one enlarged left axillar LN. Her right breast and LN biopsy was positive for invasiveductalcarcinoma with components of DCIS, calcifications and necrosis in the breast. Her left axillarynodebiopsy was benign. -staging work up showed bone metastases, indeterminate liver lesion, and left breast mass. 9/9/21Liver biopsy was negativefor malignant cellsbut her 01/12/20 Bone Biopsy was positive for metastatic carcinoma from primary breast cancer. Her ER/PR/HER2 were all negative bone biopsy, possibly related to decalcification. -Given metastatic disease, no furhter left breast biopsy is needed  -She beganfirst lineTHP every 3 weeks on 01/03/20. Dr. Burr Medico previously discussed considering switch to maintenance therapy with Herceptin, Perjeta and AIafter 6-8 cycle chemo THP if she has good response. -CT CAP from 03/23/20 which showed more sclerotic appearance of her bone lesions, likely treatment response, slightly enlarged liver lesion in segment IV, others are stable. Her liver lesions were indeterminate on previous CT and MRI, some are hemangioma based on MRI.   2. HTN -Well controlled on Metoprolol andAmlodipine. Continue to f/u with PCP  3. Hypokalemia -Continue 20 mg every other day  4. Goal of care discussion  -full code  Disposition: Carolyn Stewart appears stable.  She completed 5 cycles of THP.  She tolerates treatment well with mild headache, diarrhea, and intermittent neuropathy.  Side effects are well managed with supportive care at home, she remains completely functional.  She is able to recover well.  Breast exam is consistent with a treatment  response in the right breast. I reviewed her images personally and in multidisciplinary conference and reviewed the images with the patient.  04/01/20 bone scan shows improved uptake in the spine and right rib, stable uptake in the pelvis, consistent with partial treatment response.  MRI from 03/31/20 shows enlarging hepatic lesion in segment IV, previously biopsied and negative for carcinoma.  The significance of this enlarging lesion in the presence of improved disease in the breast and bones is unclear.  Recommendation is to proceed with a PET scan to see if this lesion is FDG avid.  The patient agrees with the plan.  She is otherwise doing well, CBC and CMP reviewed. Proceed with cycle 6 THP today as planned.  She will return for follow-up with imaging results and treatment in 3 weeks.  We discussed she is a candidate for genetic testing given her [place], she is the only one in her family with cancer. She has children. She is interested and I referred her.   The case was discussed with Dr. Burr Medico prior to her planned absence and again with Dr. Benay Spice today.   Orders Placed This Encounter  Procedures  . NM PET Image Initial (PI) Skull Base To Thigh    Standing Status:   Future    Standing Expiration Date:   04/15/2021    Order Specific Question:   If indicated for the ordered procedure, I authorize the administration of a radiopharmaceutical per Radiology protocol    Answer:   Yes    Order Specific Question:   Is the patient pregnant?    Answer:   No    Order Specific Question:   Preferred imaging location?    Answer:   [facility]  . Ambulatory referral to Genetics    Referral Priority:   Routine    Referral Type:   Consultation    Referral Reason:   Specialty Services Required    Number of Visits Requested:   1   All questions were answered. The patient knows to call the clinic with any problems, questions or concerns. No barriers to learning were detected. Total encounter time was 35  minutes.     Alla Feeling, NP 04/15/20

## 2020-04-15 ENCOUNTER — Inpatient Hospital Stay: Payer: Commercial Managed Care - PPO

## 2020-04-15 ENCOUNTER — Inpatient Hospital Stay (HOSPITAL_BASED_OUTPATIENT_CLINIC_OR_DEPARTMENT_OTHER): Payer: Commercial Managed Care - PPO | Admitting: Nurse Practitioner

## 2020-04-15 ENCOUNTER — Other Ambulatory Visit: Payer: Self-pay

## 2020-04-15 ENCOUNTER — Encounter: Payer: Self-pay | Admitting: *Deleted

## 2020-04-15 ENCOUNTER — Encounter: Payer: Self-pay | Admitting: Nurse Practitioner

## 2020-04-15 VITALS — BP 150/88 | HR 94 | Temp 97.8°F | Resp 18 | Ht 67.0 in | Wt 197.4 lb

## 2020-04-15 DIAGNOSIS — C50411 Malignant neoplasm of upper-outer quadrant of right female breast: Secondary | ICD-10-CM

## 2020-04-15 DIAGNOSIS — Z170 Estrogen receptor positive status [ER+]: Secondary | ICD-10-CM

## 2020-04-15 LAB — CBC WITH DIFFERENTIAL (CANCER CENTER ONLY)
Abs Immature Granulocytes: 0.05 10*3/uL (ref 0.00–0.07)
Basophils Absolute: 0 10*3/uL (ref 0.0–0.1)
Basophils Relative: 1 %
Eosinophils Absolute: 0.1 10*3/uL (ref 0.0–0.5)
Eosinophils Relative: 1 %
HCT: 36.4 % (ref 36.0–46.0)
Hemoglobin: 11.4 g/dL — ABNORMAL LOW (ref 12.0–15.0)
Immature Granulocytes: 1 %
Lymphocytes Relative: 39 %
Lymphs Abs: 2.7 10*3/uL (ref 0.7–4.0)
MCH: 28.1 pg (ref 26.0–34.0)
MCHC: 31.3 g/dL (ref 30.0–36.0)
MCV: 89.9 fL (ref 80.0–100.0)
Monocytes Absolute: 1.1 10*3/uL — ABNORMAL HIGH (ref 0.1–1.0)
Monocytes Relative: 16 %
Neutro Abs: 2.9 10*3/uL (ref 1.7–7.7)
Neutrophils Relative %: 42 %
Platelet Count: 323 10*3/uL (ref 150–400)
RBC: 4.05 MIL/uL (ref 3.87–5.11)
RDW: 16 % — ABNORMAL HIGH (ref 11.5–15.5)
WBC Count: 6.9 10*3/uL (ref 4.0–10.5)
nRBC: 0 % (ref 0.0–0.2)

## 2020-04-15 LAB — CMP (CANCER CENTER ONLY)
ALT: 10 U/L (ref 0–44)
AST: 21 U/L (ref 15–41)
Albumin: 3.5 g/dL (ref 3.5–5.0)
Alkaline Phosphatase: 91 U/L (ref 38–126)
Anion gap: 5 (ref 5–15)
BUN: 13 mg/dL (ref 6–20)
CO2: 26 mmol/L (ref 22–32)
Calcium: 9 mg/dL (ref 8.9–10.3)
Chloride: 111 mmol/L (ref 98–111)
Creatinine: 0.88 mg/dL (ref 0.44–1.00)
GFR, Estimated: >60 mL/min (ref >60)
Glucose, Bld: 101 mg/dL — ABNORMAL HIGH (ref 70–99)
Potassium: 3.7 mmol/L (ref 3.5–5.1)
Sodium: 142 mmol/L (ref 135–145)
Total Bilirubin: 0.2 mg/dL — ABNORMAL LOW (ref 0.3–1.2)
Total Protein: 6.3 g/dL — ABNORMAL LOW (ref 6.5–8.1)

## 2020-04-15 MED ORDER — SODIUM CHLORIDE 0.9 % IV SOLN
Freq: Once | INTRAVENOUS | Status: AC
  Filled 2020-04-15: qty 250

## 2020-04-15 MED ORDER — DIPHENHYDRAMINE HCL 25 MG PO CAPS
50.0000 mg | ORAL_CAPSULE | Freq: Once | ORAL | Status: AC
  Administered 2020-04-15: 50 mg via ORAL

## 2020-04-15 MED ORDER — ACETAMINOPHEN 325 MG PO TABS
650.0000 mg | ORAL_TABLET | Freq: Once | ORAL | Status: AC
  Administered 2020-04-15: 650 mg via ORAL

## 2020-04-15 MED ORDER — SODIUM CHLORIDE 0.9 % IV SOLN
420.0000 mg | Freq: Once | INTRAVENOUS | Status: AC
  Administered 2020-04-15: 420 mg via INTRAVENOUS
  Filled 2020-04-15: qty 14

## 2020-04-15 MED ORDER — TRASTUZUMAB-ANNS CHEMO 150 MG IV SOLR
6.0000 mg/kg | Freq: Once | INTRAVENOUS | Status: AC
  Administered 2020-04-15: 525 mg via INTRAVENOUS
  Filled 2020-04-15: qty 25

## 2020-04-15 MED ORDER — SODIUM CHLORIDE 0.9% FLUSH
10.0000 mL | INTRAVENOUS | Status: DC | PRN
  Administered 2020-04-15: 10 mL
  Filled 2020-04-15: qty 10

## 2020-04-15 MED ORDER — HEPARIN SOD (PORK) LOCK FLUSH 100 UNIT/ML IV SOLN
500.0000 [IU] | Freq: Once | INTRAVENOUS | Status: AC | PRN
  Administered 2020-04-15: 500 [IU]
  Filled 2020-04-15: qty 5

## 2020-04-15 MED ORDER — ACETAMINOPHEN 325 MG PO TABS
ORAL_TABLET | ORAL | Status: AC
  Filled 2020-04-15: qty 2

## 2020-04-15 MED ORDER — SODIUM CHLORIDE 0.9 % IV SOLN
75.0000 mg/m2 | Freq: Once | INTRAVENOUS | Status: AC
  Administered 2020-04-15: 140 mg via INTRAVENOUS
  Filled 2020-04-15: qty 14

## 2020-04-15 MED ORDER — PALONOSETRON HCL INJECTION 0.25 MG/5ML
0.2500 mg | Freq: Once | INTRAVENOUS | Status: AC
  Administered 2020-04-15: 0.25 mg via INTRAVENOUS

## 2020-04-15 MED ORDER — DIPHENHYDRAMINE HCL 25 MG PO CAPS
ORAL_CAPSULE | ORAL | Status: AC
  Filled 2020-04-15: qty 2

## 2020-04-15 MED ORDER — SODIUM CHLORIDE 0.9 % IV SOLN
10.0000 mg | Freq: Once | INTRAVENOUS | Status: AC
  Administered 2020-04-15: 10 mg via INTRAVENOUS
  Filled 2020-04-15: qty 10

## 2020-04-15 MED ORDER — PALONOSETRON HCL INJECTION 0.25 MG/5ML
INTRAVENOUS | Status: AC
  Filled 2020-04-15: qty 5

## 2020-04-15 NOTE — Addendum Note (Signed)
Addended by: Alla Feeling on: 04/15/2020 12:42 PM   Modules accepted: Orders

## 2020-04-15 NOTE — Patient Instructions (Signed)
[facility] Discharge Instructions for Patients Receiving Chemotherapy  Today you received the following chemotherapy agents Trastuzumab-anns (KANJINTI), Pertuzumab (PERJETA) & Docetaxel (TAXOTERE).  To help prevent nausea and vomiting after your treatment, we encourage you to take your nausea medication as prescribed.   If you develop nausea and vomiting that is not controlled by your nausea medication, call the clinic.   BELOW ARE SYMPTOMS THAT SHOULD BE REPORTED IMMEDIATELY:  *FEVER GREATER THAN 100.5 F  *CHILLS WITH OR WITHOUT FEVER  NAUSEA AND VOMITING THAT IS NOT CONTROLLED WITH YOUR NAUSEA MEDICATION  *UNUSUAL SHORTNESS OF BREATH  *UNUSUAL BRUISING OR BLEEDING  TENDERNESS IN MOUTH AND THROAT WITH OR WITHOUT PRESENCE OF ULCERS  *URINARY PROBLEMS  *BOWEL PROBLEMS  UNUSUAL RASH Items with * indicate a potential emergency and should be followed up as soon as possible.  Feel free to call the clinic should you have any questions or concerns. The clinic phone number is [phone].  Please show the CHEMO ALERT CARD at check-in to the Emergency Department and triage nurse.   

## 2020-04-15 NOTE — Progress Notes (Signed)
Patient wasn't arrived to flush after she went to lab so I spoke with Regan Rakers and she wanted patient arrived to doctor side and they will access her over there

## 2020-04-19 ENCOUNTER — Encounter: Payer: Self-pay | Admitting: *Deleted

## 2020-04-19 ENCOUNTER — Telehealth: Payer: Self-pay | Admitting: Nurse Practitioner

## 2020-04-19 NOTE — Telephone Encounter (Signed)
Scheduled appointments per 12/23 los. Spoke to patient who is aware of appointments date and times.

## 2020-04-28 ENCOUNTER — Encounter: Payer: Self-pay | Admitting: Hematology

## 2020-04-28 ENCOUNTER — Other Ambulatory Visit: Payer: Self-pay

## 2020-04-28 ENCOUNTER — Encounter (HOSPITAL_COMMUNITY)
Admission: RE | Admit: 2020-04-28 | Discharge: 2020-04-28 | Disposition: A | Discharge status: Home / Self Care | Payer: Commercial Managed Care - PPO | Source: Ambulatory Visit | Attending: Nurse Practitioner | Admitting: Nurse Practitioner

## 2020-04-28 DIAGNOSIS — Z170 Estrogen receptor positive status [ER+]: Secondary | ICD-10-CM | POA: Insufficient documentation

## 2020-04-28 DIAGNOSIS — C50411 Malignant neoplasm of upper-outer quadrant of right female breast: Secondary | ICD-10-CM | POA: Diagnosis not present

## 2020-04-28 LAB — GLUCOSE, CAPILLARY: Glucose-Capillary: 89 mg/dL (ref 70–99)

## 2020-04-28 MED ORDER — FLUDEOXYGLUCOSE F - 18 (FDG) INJECTION
10.6000 | Freq: Once | INTRAVENOUS | Status: AC
  Administered 2020-04-28: 10.6 via INTRAVENOUS

## 2020-04-28 NOTE — Progress Notes (Signed)
Pt has been re enrolled in the [employer] copay savings card program w/ [employer].  Pt will pay $0 per dose or cycle for all first AND subsequent fills, up to $10,000 per calendar year from 04/24/20 to 04/23/21.

## 2020-04-29 ENCOUNTER — Encounter: Payer: Self-pay | Admitting: *Deleted

## 2020-04-29 ENCOUNTER — Other Ambulatory Visit: Payer: Self-pay | Admitting: Hematology

## 2020-05-04 NOTE — Progress Notes (Signed)
[place]   [phone] [phone]   Clinic Follow up Note   Patient Care Team: Julian Hy, PA-C as PCP - General (Physician Assistant) [name], MD as Consulting Physician (General Surgery) Truitt Merle, MD as Consulting Physician (Hematology) Kyung Rudd, MD as Consulting Physician (Radiation Oncology) Mauro Kaufmann, RN as Oncology Nurse Navigator Rockwell Germany, RN as Oncology Nurse Navigator  Date of Service:  05/06/2020  CHIEF COMPLAINT: F/u of metastatic right breast cancer  SUMMARY OF ONCOLOGIC HISTORY: Oncology History Overview Note  Cancer Staging Malignant neoplasm of upper-outer quadrant of right breast in female, estrogen receptor positive ([place]) Staging form: Breast, AJCC 8th Edition - Clinical stage from 11/25/2019: Stage IIA (cT3, cN1, cM0, G2, ER+, PR+, HER2+) - Signed by Truitt Merle, MD on 12/03/2019    Malignant neoplasm of upper-outer quadrant of right breast in female, estrogen receptor positive ([place])  11/13/2019 Mammogram    Diagnostic Mammogram 11/13/19  IMPRESSION: -Highly suspicious mass and calcifications in the right breast centered between 9 and 12 o'clock spanning up to 11 cm.  -Multiple masses in the right axilla. One of the masses contains calcifications, denoted as mass number 2 measuring 9 x 11 by 10 mm. Several of these masses do not appear to represent definitive lymph nodes. Others may be small but abnormal appearing lymph nodes. Taken in total, a cluster of masses in the right axilla spans up to 3.7 cm.  -There are fibrocystic changes on the left. There are also some solid-appearing masses. A solid appearing mass at 10:30, 6 cm from the nipple measures 9 x 6 x 6 mm. An adjacent solid mass at 10 o'clock, 6 cm from the nipple measures 8 x 6 by 5 mm. Another solid-appearing mass at 11 o'clock, 1 cm from the nipple measures 5 x 4 by 5 mm. Fibrocystic changes are seen at 10 o'clock, 12 o'clock, and 4  o'clock. Another solid-appearing mass seen at 5 o'clock, 4 cm from the nipple measuring 6 x 3 x 4 mm. There is a single abnormal node in the left axilla with a cortex measuring 5 mm and restriction of the fatty hilum.   11/25/2019 Initial Biopsy   Diagnosis 1. Breast, right, needle core biopsy, upper outer - INVASIVE MAMMARY CARCINOMA - MAMMARY CARCINOMA IN SITU WITH CALCIFICATIONS AND NECROSIS - SEE COMMENT 2. Lymph node, needle/core biopsy, right axilla - INVASIVE MAMMARY CARCINOMA - SEE COMMENT 3. Lymph node, needle/core biopsy, left axilla - BENIGN LYMPH NODE - NO CARCINOMA IDENTIFIED Microscopic Comment 1. The biopsy material shows an infiltrative proliferation of cells arranged linearly and in small clusters. Based on the biopsy, the carcinoma appears Nottingham grade 2 of 3 and measures 1.2 cm in greatest linear extent. E-cadherin and prognostic markers (ER/PR/ki-67/HER2)are pending and will be reported in an addendum. Dr. Tresa Moore reviewed the case and agrees with the above diagnosis. These results were called to [facility] on November 26, 2019. 2. Definitive nodal tissue is not identified. This biopsy has a slightly different architecture morphology than what is seen in part 1. The carcinoma measures 0.7 cm in greatest dimension. E-cadherin and prognostic markers (ER, PR, Ki-67 and HER-2) are pending and will be reported in an addendum.   11/25/2019 Receptors her2   1. PROGNOSTIC INDICATORS Results: IMMUNOHISTOCHEMICAL AND MORPHOMETRIC ANALYSIS PERFORMED MANUALLY The tumor cells are POSITIVE for Her2 (3+). Estrogen Receptor: 90%, POSITIVE, STRONG STAINING INTENSITY Progesterone Receptor: 20%, POSITIVE, STRONG STAINING INTENSITY Proliferation Marker Ki67: 30%  2. PROGNOSTIC INDICATORS Results: IMMUNOHISTOCHEMICAL AND MORPHOMETRIC ANALYSIS PERFORMED MANUALLY The tumor cells are POSITIVE for Her2 (3+). Estrogen Receptor: 90%, POSITIVE, STRONG STAINING  INTENSITY Progesterone Receptor: 25%, POSITIVE, STRONG STAINING INTENSITY Proliferation Marker Ki67: 30%   11/25/2019 Cancer Staging   Staging form: Breast, AJCC 8th Edition - Clinical stage from 11/25/2019: Stage IIA (cT3, cN1, cM0, G2, ER+, PR+, HER2+) - Signed by Truitt Merle, MD on 12/03/2019   12/01/2019 Initial Diagnosis   Malignant neoplasm of upper-outer quadrant of right breast in female, estrogen receptor positive ([place])   12/10/2019 Imaging   Bone Scan  IMPRESSION: Findings concerning for bony metastatic disease in the left acetabulum region, L3 vertebral body region, and anterolateral right tenth rib.   Increased uptake in several large joints likely is of arthropathic etiology.   12/11/2019 Imaging   CT CAP w contrast  IMPRESSION: Prominent glandular tissue in the central right breast with nipple retraction, likely corresponding to the patient's known right breast neoplasm.   Prominent right axillary nodes measuring up to 8 mm short axis, suspicious for nodal metastases in this clinical context.   3 mm subpleural pulmonary nodule in the right lower lobe, likely benign. However, follow-up CT chest is suggested in 3-6 months.   Multiple hepatic lesions, including a dominant 2.4 cm lesion in segment 6, which is considered indeterminate. Metastasis is not excluded. Consider MRI abdomen with/without contrast for further characterization.   11 mm sclerotic lesion along the left posterior aspect of the T7 vertebral body raises concern for isolated osseous metastasis. Correlate with priors if available. Benign vertebral hemangioma at T11.   12/12/2019 Breast MRI   IMPRESSION: 1. The biopsy proven malignancy in the right breast involves all 4 quadrants and spans up to 9 cm by MRI. There is enhancement within the skin at the level of the nipple.   2. Multiple matted masses are seen in the right axilla, either abnormal metastatic lymph nodes or a combination of masses  and metastatic lymph nodes. One of these has been previously biopsied showing invasive mammary carcinoma without definitive nodal tissue.   3.  There is a 1.5 cm Rotter's lymph node on the right.   4. There is markedly abnormal enhancement and edema in the right pectoralis major muscle spanning 6-7 cm, suspicious for metastatic disease.   5. There is diffuse nodular enhancement throughout the left breast, which may correspond with the solid-appearing masses seen on ultrasound. These all have a similar appearance on MRI.   6.  No findings of left axillary lymphadenopathy.   12/15/2019 Procedure   PAC placed    12/24/2019 Imaging   MRI abdomen  IMPRESSION: 1. Multifocal enhancing bone lesions are noted within the lumbar spine, and bony pelvis compatible with metastatic disease. 2. Four, small peripherally enhancing cystic lesions are noted within the liver worrisome for metastatic disease. 3. 2 enhancing liver lesions are also noted with signal and enhancement characteristics consistent with benign liver hemangioma. 4. Well-circumscribed, enhancing T2 and T1 hypointense structure within the right adnexal region is indeterminate. It is unclear whether not this is arising from the right ovary, broad ligament or right side of uterine fundus. Differential considerations include fibrothecoma, versus pedunculated uterine leiomyoma. Metastatic disease is considered less favored. Attention on follow-up imaging is recommended.     01/03/2020 -  Chemotherapy   First line THP q3weeks starting 01/03/20    01/05/2020 Imaging   US Abdomen  IMPRESSION: 1. Suspect hematoma in the medial right lobe of the liver in  the area of previous biopsy. Note that recent MR does not show lesion in this area. This area has ill-defined margins and measures 3.9 x 4.0 x 3.5 cm. No perihepatic fluid.   2. Probable hemangiomas elsewhere in the right lobe of the liver. Note that several smaller lesions seen on  recent MR are not appreciable by ultrasound.   3.  Study otherwise unremarkable.   01/12/2020 Pathology Results   FINAL MICROSCOPIC DIAGNOSIS:   A. BONE, SCLEROTIC LESION, LEFT ANTERIOR ILIAC SPINE, BIOPSY:  - Metastatic carcinoma, consistent with patient's clinical history of  primary breast carcinoma  - See comment   COMMENT:  Immunohistochemical stains show that the tumor cells are positive for  CK7 and GATA3; and negative for CK20, consistent with above  interpretation.   PROGNOSTIC INDICATOR RESULTS:  The tumor cells are NEGATIVE for Her2 (0); see comment  Estrogen Receptor: NEGATIVE; see comment  Progesterone Receptor: NEGATIVE; see comment    03/23/2020 Imaging   CT CAP IMPRESSION: 1. Right lower lobe perifissural nodule is slightly more conspicuous on today's study. Close attention on follow-up recommended. 2. Interval progression of segment IV liver lesion, concerning for metastatic progression. The remaining visualized liver lesions are stable to smaller in the interval. 3. Interval evolution in appearance of thoracolumbar spinal lesions and left iliac crest lesion, potentially reflecting response to therapy/healing. No definite new osseous lesions on today's study. 4. New small fluid collection right cul-de-sac. 5. Probable fibroid change in the uterus including exophytic right fundal lesion. 6. Aortic Atherosclerosis (ICD10-I70.0).   03/31/2020 Imaging   MR ABD IMPRESSION: 1. Enlarging hepatic lesion in hepatic subsegment IV suspicious for hepatic metastasis. The it is uncertain whether the change in the short interval is due to technical factors with different modalities or true enlargement. 2. Lesion in the posterior RIGHT hepatic lobe with imaging features that are atypical for hemangioma but stability and signs of enhancement on later phase raising this question. Another more classic appearing may angioma seen inferior to this location in hepatic  subsegment VI. Attention on follow-up. 3. Heterogeneous enhancement throughout the spine particularly at L3 as exhibited on the prior study with very patchy marrow signal remains suspicious for bony metastatic lesions in this patient with known bone metastases.   04/01/2020 Imaging   Bone scan IMPRESSION: Good response to therapy with resolution of sites of uptake seen previously at the cervical spine and posterior RIGHT tenth rib as well as a diminished degree of uptake at remaining previously identified osseous metastatic foci.      CURRENT THERAPY:  First line chemoTHPq3weeksstarting 01/03/20  INTERVAL HISTORY:  Carolyn Stewart is here for a follow up. She presents to the clinic alone. She is doing well overall She has mild to moderate fatigue first week after chemo, but still able to do most activities, and fees a lot better from second week No significant nausea, diarrhea, or other complaints.  No neuropathy. She is eating well, weight is stable   All other systems were reviewed with the patient and are negative.  MEDICAL HISTORY:  Past Medical History:  Diagnosis Date  . Cancer ([place]) 2021  . Hypertension     SURGICAL HISTORY: Past Surgical History:  Procedure Laterality Date  . left parotid tumor removal   2006  . PORTACATH PLACEMENT Left 12/15/2019   Procedure: INSERTION PORT-A-CATH WITH ULTRASOUND GUIDANCE;  Surgeon: [name], MD;  Location: [facility];  Service: General;  Laterality: Left;  . TONSILLECTOMY      I have  reviewed the social history and family history with the patient and they are unchanged from previous note.  ALLERGIES:  has No Known Allergies.  MEDICATIONS:  Current Outpatient Medications  Medication Sig Dispense Refill  . acetaminophen (TYLENOL) 500 MG tablet Take 1,000 mg by mouth every 6 (six) hours as needed for moderate pain or headache.    Marland Kitchen amLODipine (NORVASC) 10 MG tablet Take 10 mg by mouth daily.    . Cholecalciferol (VITAMIN  D3) 50 MCG (2000 UT) TABS Take 2,000 Units by mouth daily.     Marland Kitchen dexamethasone (DECADRON) 4 MG tablet Take 2 tablets (8 mg total) by mouth 2 (two) times daily. Start the day before Taxotere. Then take daily x 3 days after chemotherapy. (Patient not taking: Reported on 01/22/2020) 30 tablet 1  . KLOR-CON M20 20 MEQ tablet TAKE 1 TABLET BY MOUTH EVERY DAY 90 tablet 1  . lidocaine-prilocaine (EMLA) cream Apply to affected area once (Patient not taking: Reported on 01/22/2020) 30 g 3  . metoprolol succinate (TOPROL-XL) 50 MG 24 hr tablet Take 50 mg by mouth daily.    Marland Kitchen omeprazole (PRILOSEC OTC) 20 MG tablet Take 20 mg by mouth daily.    . ondansetron (ZOFRAN) 8 MG tablet Take 1 tablet (8 mg total) by mouth 2 (two) times daily as needed (Nausea or vomiting). Start on the third day after chemotherapy. (Patient not taking: Reported on 01/22/2020) 30 tablet 1  . oxyCODONE (OXY IR/ROXICODONE) 5 MG immediate release tablet Take 1 tablet (5 mg total) by mouth every 6 (six) hours as needed for severe pain. (Patient not taking: Reported on 12/30/2019) 10 tablet 0  . prochlorperazine (COMPAZINE) 10 MG tablet Take 1 tablet (10 mg total) by mouth every 6 (six) hours as needed (Nausea or vomiting). (Patient not taking: Reported on 01/22/2020) 30 tablet 1   No current facility-administered medications for this visit.    PHYSICAL EXAMINATION: ECOG PERFORMANCE STATUS: 0 - Asymptomatic  Vitals:   05/06/20 0924  BP: (!) 145/76  Pulse: 99  Resp: 16  Temp: (!) 96.8 F (36 C)  SpO2: 99%   Filed Weights   05/06/20 0924  Weight: 201 lb 1.6 oz (91.2 kg)    GENERAL:alert, no distress and comfortable SKIN: skin color, texture, turgor are normal, no rashes or significant lesions EYES: normal, Conjunctiva are pink and non-injected, sclera clear NECK: supple, thyroid normal size, non-tender, without nodularity LYMPH:  no palpable lymphadenopathy in the cervical, axillary  LUNGS: clear to auscultation and percussion with  normal breathing effort HEART: regular rate & rhythm and no murmurs and no lower extremity edema ABDOMEN:abdomen soft, non-tender and normal bowel sounds Musculoskeletal:no cyanosis of digits and no clubbing  NEURO: alert & oriented x 3 with fluent speech, no focal motor/sensory deficits  LABORATORY DATA:  I have reviewed the data as listed CBC Latest Ref Rng & Units 05/06/2020 04/15/2020 03/25/2020  WBC 4.0 - 10.5 K/uL 7.8 6.9 18.2(H)  Hemoglobin 12.0 - 15.0 g/dL 11.0(L) 11.4(L) 10.8(L)  Hematocrit 36.0 - 46.0 % 35.6(L) 36.4 34.0(L)  Platelets 150 - 400 K/uL 355 323 388     CMP Latest Ref Rng & Units 05/06/2020 04/15/2020 03/25/2020  Glucose 70 - 99 mg/dL 113(H) 101(H) 106(H)  BUN 6 - 20 mg/dL _0 Creatinine 0.44 - 1.00 mg/dL 0.88 0.88 1.06(H)  Sodium 135 - 145 mmol/L 143 142 143  Potassium 3.5 - 5.1 mmol/L 3.4(L) 3.7 3.5  Chloride 98 - 111 mmol/L 112(H) 111 111  CO2 22 - 32 mmol/L _0 Calcium 8.9 - 10.3 mg/dL 8.9 9.0 9.4  Total Protein 6.5 - 8.1 g/dL 6.0(L) 6.3(L) 6.7  Total Bilirubin 0.3 - 1.2 mg/dL 0.3 0.2(L) 0.3  Alkaline Phos 38 - 126 U/L 81 91 88  AST 15 - 41 U/L 13(L) 21 14(L)  ALT 0 - 44 U/L <_1 RADIOGRAPHIC STUDIES: I have personally reviewed the radiological images as listed and agreed with the findings in the report. No results found.   ASSESSMENT & PLAN:  Malayna Noori is a 60 y.o. female with    1.Malignant neoplasm of upper-outer quadrant of right breast,ductal carcinoma,StageIIA, c(T3N1Mx),with bone and possible liver mets,ER+/PR+/HER2+,GradeII-III -She was diagnosed in 11/2019 witha largeright breast mass in 4 quadrants measuringup to 11cm and right lymphadenopathy. Her right breast and LN biopsy was positive for invasiveductalcarcinoma with components of DCIS. Her left axillarynodebiopsy was benign. -9/9/21Liver biopsy was negativefor malignant cellsbut her 01/12/20 Bone Biopsy was positive for metastatic carcinoma  from primary breast cancer. Her ER/PR/HER2 were all negative bone biopsy, possibly related to decalcification. -She was also seen to have left breast mass on her 12/12/19 MRI, but given metastatic disease, no further biopsy is needed due to her metastatic disease.  -Wepreviouslydiscussed that she has stage IV metastatic breast cancerwhichis not curable but very treatable.We do not plan toofferright breast surgery in the near future,butmay be an option down the road. -I started her onfirst lineTHP every 3 weeks on 01/03/20. Based on response, will consider switch to maintenance therapy with Herceptin, Perjeta and AIafter 8 cycle chemo THP.  -she has been tolerating chemotherapy very well -I reviewed her recent PET scan, which showed no hypermetabolic lesions in the liver, bone or other places, she has had excellent response.  Her liver lesions are indeterminate.   2. History of vaginal prolaps -Her 01/09/20 ED visit indicated diarrhea as well as vaginal prolapse.The patient's diarrhea has since resolved.  -She will follow up with her OB/GYN regarding the vaginal prolapse.She was seen byDr. Rip Harbour.She plans to have hysterectomy when she is off chemo  3. HTN -Well controlled on Metoprolol andAmlodipine. Continue to f/u with PCP  4. Goal of care discussion  -she is full code now    PLAN: -PET scan reviewed, good response -Labs reviewed, hyperlipoidemia, will proceed with cycle 7 THP today -Lab, flush, follow-up and last cycle THP in 3 weeks, plan to change to HP (injection if approved by insurance) and AI    No problem-specific Assessment & Plan notes found for this encounter.   No orders of the defined types were placed in this encounter.  All questions were answered. The patient knows to call the clinic with any problems, questions or concerns. No barriers to learning was detected. The total time spent in the appointment was 30 minutes.     Truitt Merle,  MD 05/06/2020   I, Joslyn Devon, am acting as scribe for Truitt Merle, MD.   I have reviewed the above documentation for accuracy and completeness, and I agree with the above.

## 2020-05-06 ENCOUNTER — Inpatient Hospital Stay: Payer: Commercial Managed Care - PPO

## 2020-05-06 ENCOUNTER — Inpatient Hospital Stay: Payer: Commercial Managed Care - PPO | Attending: Hematology

## 2020-05-06 ENCOUNTER — Inpatient Hospital Stay (HOSPITAL_BASED_OUTPATIENT_CLINIC_OR_DEPARTMENT_OTHER): Payer: Commercial Managed Care - PPO | Admitting: Hematology

## 2020-05-06 ENCOUNTER — Other Ambulatory Visit: Payer: Self-pay

## 2020-05-06 ENCOUNTER — Encounter: Payer: Self-pay | Admitting: *Deleted

## 2020-05-06 ENCOUNTER — Encounter: Payer: Self-pay | Admitting: Hematology

## 2020-05-06 VITALS — BP 145/76 | HR 99 | Temp 96.8°F | Resp 16 | Ht 67.0 in | Wt 201.1 lb

## 2020-05-06 DIAGNOSIS — Z170 Estrogen receptor positive status [ER+]: Secondary | ICD-10-CM

## 2020-05-06 DIAGNOSIS — C50411 Malignant neoplasm of upper-outer quadrant of right female breast: Secondary | ICD-10-CM | POA: Diagnosis not present

## 2020-05-06 DIAGNOSIS — Z5112 Encounter for antineoplastic immunotherapy: Secondary | ICD-10-CM | POA: Diagnosis present

## 2020-05-06 DIAGNOSIS — Z79899 Other long term (current) drug therapy: Secondary | ICD-10-CM | POA: Insufficient documentation

## 2020-05-06 DIAGNOSIS — N8110 Cystocele, unspecified: Secondary | ICD-10-CM | POA: Diagnosis not present

## 2020-05-06 DIAGNOSIS — Z7952 Long term (current) use of systemic steroids: Secondary | ICD-10-CM | POA: Diagnosis not present

## 2020-05-06 DIAGNOSIS — Z5111 Encounter for antineoplastic chemotherapy: Secondary | ICD-10-CM | POA: Insufficient documentation

## 2020-05-06 DIAGNOSIS — I10 Essential (primary) hypertension: Secondary | ICD-10-CM | POA: Diagnosis not present

## 2020-05-06 LAB — CBC WITH DIFFERENTIAL (CANCER CENTER ONLY)
Abs Immature Granulocytes: 0.09 10*3/uL — ABNORMAL HIGH (ref 0.00–0.07)
Basophils Absolute: 0 10*3/uL (ref 0.0–0.1)
Basophils Relative: 0 %
Eosinophils Absolute: 0.1 10*3/uL (ref 0.0–0.5)
Eosinophils Relative: 1 %
HCT: 35.6 % — ABNORMAL LOW (ref 36.0–46.0)
Hemoglobin: 11 g/dL — ABNORMAL LOW (ref 12.0–15.0)
Immature Granulocytes: 1 %
Lymphocytes Relative: 35 %
Lymphs Abs: 2.7 10*3/uL (ref 0.7–4.0)
MCH: 27.5 pg (ref 26.0–34.0)
MCHC: 30.9 g/dL (ref 30.0–36.0)
MCV: 89 fL (ref 80.0–100.0)
Monocytes Absolute: 1.1 10*3/uL — ABNORMAL HIGH (ref 0.1–1.0)
Monocytes Relative: 14 %
Neutro Abs: 3.9 10*3/uL (ref 1.7–7.7)
Neutrophils Relative %: 49 %
Platelet Count: 355 10*3/uL (ref 150–400)
RBC: 4 MIL/uL (ref 3.87–5.11)
RDW: 17 % — ABNORMAL HIGH (ref 11.5–15.5)
WBC Count: 7.8 10*3/uL (ref 4.0–10.5)
nRBC: 0 % (ref 0.0–0.2)

## 2020-05-06 LAB — CMP (CANCER CENTER ONLY)
ALT: <6 U/L (ref 0–44)
AST: 13 U/L — ABNORMAL LOW (ref 15–41)
Albumin: 3.2 g/dL — ABNORMAL LOW (ref 3.5–5.0)
Alkaline Phosphatase: 81 U/L (ref 38–126)
Anion gap: 4 — ABNORMAL LOW (ref 5–15)
BUN: 12 mg/dL (ref 6–20)
CO2: 27 mmol/L (ref 22–32)
Calcium: 8.9 mg/dL (ref 8.9–10.3)
Chloride: 112 mmol/L — ABNORMAL HIGH (ref 98–111)
Creatinine: 0.88 mg/dL (ref 0.44–1.00)
GFR, Estimated: >60 mL/min (ref >60)
Glucose, Bld: 113 mg/dL — ABNORMAL HIGH (ref 70–99)
Potassium: 3.4 mmol/L — ABNORMAL LOW (ref 3.5–5.1)
Sodium: 143 mmol/L (ref 135–145)
Total Bilirubin: 0.3 mg/dL (ref 0.3–1.2)
Total Protein: 6 g/dL — ABNORMAL LOW (ref 6.5–8.1)

## 2020-05-06 MED ORDER — PALONOSETRON HCL INJECTION 0.25 MG/5ML
INTRAVENOUS | Status: AC
  Filled 2020-05-06: qty 5

## 2020-05-06 MED ORDER — PALONOSETRON HCL INJECTION 0.25 MG/5ML
0.2500 mg | Freq: Once | INTRAVENOUS | Status: AC
  Administered 2020-05-06: 0.25 mg via INTRAVENOUS

## 2020-05-06 MED ORDER — DIPHENHYDRAMINE HCL 25 MG PO CAPS
50.0000 mg | ORAL_CAPSULE | Freq: Once | ORAL | Status: AC
  Administered 2020-05-06: 50 mg via ORAL

## 2020-05-06 MED ORDER — SODIUM CHLORIDE 0.9% FLUSH
10.0000 mL | Freq: Once | INTRAVENOUS | Status: AC
  Administered 2020-05-06: 10 mL
  Filled 2020-05-06: qty 10

## 2020-05-06 MED ORDER — DIPHENHYDRAMINE HCL 25 MG PO CAPS
ORAL_CAPSULE | ORAL | Status: AC
  Filled 2020-05-06: qty 2

## 2020-05-06 MED ORDER — SODIUM CHLORIDE 0.9 % IV SOLN
10.0000 mg | Freq: Once | INTRAVENOUS | Status: AC
  Administered 2020-05-06: 10 mg via INTRAVENOUS
  Filled 2020-05-06: qty 10

## 2020-05-06 MED ORDER — SODIUM CHLORIDE 0.9 % IV SOLN
75.0000 mg/m2 | Freq: Once | INTRAVENOUS | Status: AC
  Administered 2020-05-06: 140 mg via INTRAVENOUS
  Filled 2020-05-06: qty 14

## 2020-05-06 MED ORDER — TRASTUZUMAB-ANNS CHEMO 150 MG IV SOLR
6.0000 mg/kg | Freq: Once | INTRAVENOUS | Status: AC
  Administered 2020-05-06: 525 mg via INTRAVENOUS
  Filled 2020-05-06: qty 25

## 2020-05-06 MED ORDER — HEPARIN SOD (PORK) LOCK FLUSH 100 UNIT/ML IV SOLN
500.0000 [IU] | Freq: Once | INTRAVENOUS | Status: AC | PRN
  Administered 2020-05-06: 500 [IU]
  Filled 2020-05-06: qty 5

## 2020-05-06 MED ORDER — ACETAMINOPHEN 325 MG PO TABS
ORAL_TABLET | ORAL | Status: AC
  Filled 2020-05-06: qty 2

## 2020-05-06 MED ORDER — SODIUM CHLORIDE 0.9 % IV SOLN
420.0000 mg | Freq: Once | INTRAVENOUS | Status: AC
  Administered 2020-05-06: 420 mg via INTRAVENOUS
  Filled 2020-05-06: qty 14

## 2020-05-06 MED ORDER — SODIUM CHLORIDE 0.9% FLUSH
10.0000 mL | INTRAVENOUS | Status: DC | PRN
  Administered 2020-05-06: 10 mL
  Filled 2020-05-06: qty 10

## 2020-05-06 MED ORDER — ACETAMINOPHEN 325 MG PO TABS
650.0000 mg | ORAL_TABLET | Freq: Once | ORAL | Status: AC
  Administered 2020-05-06: 650 mg via ORAL

## 2020-05-06 MED ORDER — SODIUM CHLORIDE 0.9 % IV SOLN
Freq: Once | INTRAVENOUS | Status: AC
  Filled 2020-05-06: qty 250

## 2020-05-06 NOTE — Patient Instructions (Signed)
[facility] Discharge Instructions for Patients Receiving Chemotherapy  Today you received the following chemotherapy agents Trastuzumab-anns (KANJINTI), Pertuzumab (PERJETA) & Docetaxel (TAXOTERE).  To help prevent nausea and vomiting after your treatment, we encourage you to take your nausea medication as prescribed.   If you develop nausea and vomiting that is not controlled by your nausea medication, call the clinic.   BELOW ARE SYMPTOMS THAT SHOULD BE REPORTED IMMEDIATELY:  *FEVER GREATER THAN 100.5 F  *CHILLS WITH OR WITHOUT FEVER  NAUSEA AND VOMITING THAT IS NOT CONTROLLED WITH YOUR NAUSEA MEDICATION  *UNUSUAL SHORTNESS OF BREATH  *UNUSUAL BRUISING OR BLEEDING  TENDERNESS IN MOUTH AND THROAT WITH OR WITHOUT PRESENCE OF ULCERS  *URINARY PROBLEMS  *BOWEL PROBLEMS  UNUSUAL RASH Items with * indicate a potential emergency and should be followed up as soon as possible.  Feel free to call the clinic should you have any questions or concerns. The clinic phone number is [phone].  Please show the CHEMO ALERT CARD at check-in to the Emergency Department and triage nurse.   

## 2020-05-07 LAB — CANCER ANTIGEN 27.29: CA 27.29: 30.2 U/mL (ref 0.0–38.6)

## 2020-05-12 ENCOUNTER — Encounter: Payer: Self-pay | Admitting: *Deleted

## 2020-05-24 NOTE — Progress Notes (Signed)
Carolyn Stewart   [phone] Fax:([phone]) [phone]   Clinic Follow up Note   Patient Care Team: Julian Hy, PA-C as PCP - General (Physician Assistant) [name], MD as Consulting Physician (General Surgery) Truitt Merle, MD as Consulting Physician (Hematology) Kyung Rudd, MD as Consulting Physician (Radiation Oncology) Mauro Kaufmann, RN as Oncology Nurse Navigator Rockwell Germany, RN as Oncology Nurse Navigator  Date of Service:  05/27/2020  CHIEF COMPLAINT: F/u of metastatic right breast cancer  SUMMARY OF ONCOLOGIC HISTORY: Oncology History Overview Note  Cancer Staging Malignant neoplasm of upper-outer quadrant of right breast in female, estrogen receptor positive ([place]) Staging form: Breast, AJCC 8th Edition - Clinical stage from 11/25/2019: Stage IIA (cT3, cN1, cM0, G2, ER+, PR+, HER2+) - Signed by Truitt Merle, MD on 12/03/2019    Malignant neoplasm of upper-outer quadrant of right breast in female, estrogen receptor positive ([place])  11/13/2019 Mammogram    Diagnostic Mammogram 11/13/19  IMPRESSION: -Highly suspicious mass and calcifications in the right breast centered between 9 and 12 o'clock spanning up to 11 cm.  -Multiple masses in the right axilla. One of the masses contains calcifications, denoted as mass number 2 measuring 9 x 11 by 10 mm. Several of these masses do not appear to represent definitive lymph nodes. Others may be small but abnormal appearing lymph nodes. Taken in total, a cluster of masses in the right axilla spans up to 3.7 cm.  -There are fibrocystic changes on the left. There are also some solid-appearing masses. A solid appearing mass at 10:30, 6 cm from the nipple measures 9 x 6 x 6 mm. An adjacent solid mass at 10 o'clock, 6 cm from the nipple measures 8 x 6 by 5 mm. Another solid-appearing mass at 11 o'clock, 1 cm from the nipple measures 5 x 4 by 5 mm. Fibrocystic changes are seen at 10 o'clock, 12 o'clock, and 4  o'clock. Another solid-appearing mass seen at 5 o'clock, 4 cm from the nipple measuring 6 x 3 x 4 mm. There is a single abnormal node in the left axilla with a cortex measuring 5 mm and restriction of the fatty hilum.   11/25/2019 Initial Biopsy   Diagnosis 1. Breast, right, needle core biopsy, upper outer - INVASIVE MAMMARY CARCINOMA - MAMMARY CARCINOMA IN SITU WITH CALCIFICATIONS AND NECROSIS - SEE COMMENT 2. Lymph node, needle/core biopsy, right axilla - INVASIVE MAMMARY CARCINOMA - SEE COMMENT 3. Lymph node, needle/core biopsy, left axilla - BENIGN LYMPH NODE - NO CARCINOMA IDENTIFIED Microscopic Comment 1. The biopsy material shows an infiltrative proliferation of cells arranged linearly and in small clusters. Based on the biopsy, the carcinoma appears Nottingham grade 2 of 3 and measures 1.2 cm in greatest linear extent. E-cadherin and prognostic markers (ER/PR/ki-67/HER2)are pending and will be reported in an addendum. Dr. Tresa Moore reviewed the case and agrees with the above diagnosis. These results were called to [facility] on November 26, 2019. 2. Definitive nodal tissue is not identified. This biopsy has a slightly different architecture morphology than what is seen in part 1. The carcinoma measures 0.7 cm in greatest dimension. E-cadherin and prognostic markers (ER, PR, Ki-67 and HER-2) are pending and will be reported in an addendum.   11/25/2019 Receptors her2   1. PROGNOSTIC INDICATORS Results: IMMUNOHISTOCHEMICAL AND MORPHOMETRIC ANALYSIS PERFORMED MANUALLY The tumor cells are POSITIVE for Her2 (3+). Estrogen Receptor: 90%, POSITIVE, STRONG STAINING INTENSITY Progesterone Receptor: 20%, POSITIVE, STRONG STAINING INTENSITY Proliferation Marker Ki67: 30%  2. PROGNOSTIC INDICATORS Results: IMMUNOHISTOCHEMICAL AND MORPHOMETRIC ANALYSIS PERFORMED MANUALLY The tumor cells are POSITIVE for Her2 (3+). Estrogen Receptor: 90%, POSITIVE, STRONG STAINING  INTENSITY Progesterone Receptor: 25%, POSITIVE, STRONG STAINING INTENSITY Proliferation Marker Ki67: 30%   11/25/2019 Cancer Staging   Staging form: Breast, AJCC 8th Edition - Clinical stage from 11/25/2019: Stage IIA (cT3, cN1, cM0, G2, ER+, PR+, HER2+) - Signed by Truitt Merle, MD on 12/03/2019   12/01/2019 Initial Diagnosis   Malignant neoplasm of upper-outer quadrant of right breast in female, estrogen receptor positive ([place])   12/10/2019 Imaging   Bone Scan  IMPRESSION: Findings concerning for bony metastatic disease in the left acetabulum region, L3 vertebral body region, and anterolateral right tenth rib.   Increased uptake in several large joints likely is of arthropathic etiology.   12/11/2019 Imaging   CT CAP w contrast  IMPRESSION: Prominent glandular tissue in the central right breast with nipple retraction, likely corresponding to the patient's known right breast neoplasm.   Prominent right axillary nodes measuring up to 8 mm short axis, suspicious for nodal metastases in this clinical context.   3 mm subpleural pulmonary nodule in the right lower lobe, likely benign. However, follow-up CT chest is suggested in 3-6 months.   Multiple hepatic lesions, including a dominant 2.4 cm lesion in segment 6, which is considered indeterminate. Metastasis is not excluded. Consider MRI abdomen with/without contrast for further characterization.   11 mm sclerotic lesion along the left posterior aspect of the T7 vertebral body raises concern for isolated osseous metastasis. Correlate with priors if available. Benign vertebral hemangioma at T11.   12/12/2019 Breast MRI   IMPRESSION: 1. The biopsy proven malignancy in the right breast involves all 4 quadrants and spans up to 9 cm by MRI. There is enhancement within the skin at the level of the nipple.   2. Multiple matted masses are seen in the right axilla, either abnormal metastatic lymph nodes or a combination of masses  and metastatic lymph nodes. One of these has been previously biopsied showing invasive mammary carcinoma without definitive nodal tissue.   3.  There is a 1.5 cm Rotter's lymph node on the right.   4. There is markedly abnormal enhancement and edema in the right pectoralis major muscle spanning 6-7 cm, suspicious for metastatic disease.   5. There is diffuse nodular enhancement throughout the left breast, which may correspond with the solid-appearing masses seen on ultrasound. These all have a similar appearance on MRI.   6.  No findings of left axillary lymphadenopathy.   12/15/2019 Procedure   PAC placed    12/24/2019 Imaging   MRI abdomen  IMPRESSION: 1. Multifocal enhancing bone lesions are noted within the lumbar spine, and bony pelvis compatible with metastatic disease. 2. Four, small peripherally enhancing cystic lesions are noted within the liver worrisome for metastatic disease. 3. 2 enhancing liver lesions are also noted with signal and enhancement characteristics consistent with benign liver hemangioma. 4. Well-circumscribed, enhancing T2 and T1 hypointense structure within the right adnexal region is indeterminate. It is unclear whether not this is arising from the right ovary, broad ligament or right side of uterine fundus. Differential considerations include fibrothecoma, versus pedunculated uterine leiomyoma. Metastatic disease is considered less favored. Attention on follow-up imaging is recommended.     01/03/2020 - 05/27/2020 Chemotherapy   First line THP q3weeks starting 01/03/20-05/27/20   01/05/2020 Imaging   US Abdomen  IMPRESSION: 1. Suspect hematoma in the medial right lobe of the liver in the  area of previous biopsy. Note that recent MR does not show lesion in this area. This area has ill-defined margins and measures 3.9 x 4.0 x 3.5 cm. No perihepatic fluid.   2. Probable hemangiomas elsewhere in the right lobe of the liver. Note that several smaller  lesions seen on recent MR are not appreciable by ultrasound.   3.  Study otherwise unremarkable.   01/12/2020 Pathology Results   FINAL MICROSCOPIC DIAGNOSIS:   A. BONE, SCLEROTIC LESION, LEFT ANTERIOR ILIAC SPINE, BIOPSY:  - Metastatic carcinoma, consistent with patient's clinical history of  primary breast carcinoma  - See comment   COMMENT:  Immunohistochemical stains show that the tumor cells are positive for  CK7 and GATA3; and negative for CK20, consistent with above  interpretation.   PROGNOSTIC INDICATOR RESULTS:  The tumor cells are NEGATIVE for Her2 (0); see comment  Estrogen Receptor: NEGATIVE; see comment  Progesterone Receptor: NEGATIVE; see comment    03/23/2020 Imaging   CT CAP IMPRESSION: 1. Right lower lobe perifissural nodule is slightly more conspicuous on today's study. Close attention on follow-up recommended. 2. Interval progression of segment IV liver lesion, concerning for metastatic progression. The remaining visualized liver lesions are stable to smaller in the interval. 3. Interval evolution in appearance of thoracolumbar spinal lesions and left iliac crest lesion, potentially reflecting response to therapy/healing. No definite new osseous lesions on today's study. 4. New small fluid collection right cul-de-sac. 5. Probable fibroid change in the uterus including exophytic right fundal lesion. 6. Aortic Atherosclerosis (ICD10-I70.0).   03/31/2020 Imaging   MR ABD IMPRESSION: 1. Enlarging hepatic lesion in hepatic subsegment IV suspicious for hepatic metastasis. The it is uncertain whether the change in the short interval is due to technical factors with different modalities or true enlargement. 2. Lesion in the posterior RIGHT hepatic lobe with imaging features that are atypical for hemangioma but stability and signs of enhancement on later phase raising this question. Another more classic appearing may angioma seen inferior to this location  in hepatic subsegment VI. Attention on follow-up. 3. Heterogeneous enhancement throughout the spine particularly at L3 as exhibited on the prior study with very patchy marrow signal remains suspicious for bony metastatic lesions in this patient with known bone metastases.   04/01/2020 Imaging   Bone scan IMPRESSION: Good response to therapy with resolution of sites of uptake seen previously at the cervical spine and posterior RIGHT tenth rib as well as a diminished degree of uptake at remaining previously identified osseous metastatic foci.   06/17/2020 -  Chemotherapy    Patient is on Treatment Plan: BREAST  DOCETAXEL + CARBOPLATIN + TRASTUZUMAB + PERTUZUMAB  (TCHP) Q21D    Patient is on Antibody Plan: BREAST TRASTUZUMAB + PERTUZUMAB Q21D       CURRENT THERAPY:  First line chemoTHPq3weeksstarting 01/03/20-05/27/20  INTERVAL HISTORY:  Carolyn Stewart is here for a follow up. She presents to the clinic alone. She is tolerating chemotherapy very well, no significant nausea, anorexia, or fatigue.  She has mild intermittent tingling on her fingers and toes, no impact on her daily activities.  Good appetite and energy level, functions very well at home.   All other systems were reviewed with the patient and are negative.  MEDICAL HISTORY:  Past Medical History:  Diagnosis Date  . Cancer ([place]) 2021  . Hypertension     SURGICAL HISTORY: Past Surgical History:  Procedure Laterality Date  . left parotid tumor removal   2006  . PORTACATH PLACEMENT Left 12/15/2019  Procedure: INSERTION PORT-A-CATH WITH ULTRASOUND GUIDANCE;  Surgeon: [name], MD;  Location: [facility];  Service: General;  Laterality: Left;  . TONSILLECTOMY      I have reviewed the social history and family history with the patient and they are unchanged from previous note.  ALLERGIES:  has No Known Allergies.  MEDICATIONS:  Current Outpatient Medications  Medication Sig Dispense Refill  . acetaminophen  (TYLENOL) 500 MG tablet Take 1,000 mg by mouth every 6 (six) hours as needed for moderate pain or headache.    Marland Kitchen amLODipine (NORVASC) 10 MG tablet Take 10 mg by mouth daily.    . Cholecalciferol (VITAMIN D3) 50 MCG (2000 UT) TABS Take 2,000 Units by mouth daily.     Marland Kitchen dexamethasone (DECADRON) 4 MG tablet Take 2 tablets (8 mg total) by mouth 2 (two) times daily. Start the day before Taxotere. Then take daily x 3 days after chemotherapy. (Patient not taking: Reported on 01/22/2020) 30 tablet 1  . KLOR-CON M20 20 MEQ tablet TAKE 1 TABLET BY MOUTH EVERY DAY 90 tablet 1  . lidocaine-prilocaine (EMLA) cream Apply to affected area once (Patient not taking: Reported on 01/22/2020) 30 g 3  . metoprolol succinate (TOPROL-XL) 50 MG 24 hr tablet Take 50 mg by mouth daily.    Marland Kitchen omeprazole (PRILOSEC OTC) 20 MG tablet Take 20 mg by mouth daily.    . ondansetron (ZOFRAN) 8 MG tablet Take 1 tablet (8 mg total) by mouth 2 (two) times daily as needed (Nausea or vomiting). Start on the third day after chemotherapy. (Patient not taking: Reported on 01/22/2020) 30 tablet 1  . oxyCODONE (OXY IR/ROXICODONE) 5 MG immediate release tablet Take 1 tablet (5 mg total) by mouth every 6 (six) hours as needed for severe pain. (Patient not taking: Reported on 12/30/2019) 10 tablet 0  . prochlorperazine (COMPAZINE) 10 MG tablet Take 1 tablet (10 mg total) by mouth every 6 (six) hours as needed (Nausea or vomiting). (Patient not taking: Reported on 01/22/2020) 30 tablet 1   No current facility-administered medications for this visit.    PHYSICAL EXAMINATION: ECOG PERFORMANCE STATUS: 0 - Asymptomatic  Vitals:   05/27/20 0812  BP: 131/80  Pulse: 96  Resp: 15  Temp: (!) 97.4 F (36.3 C)  SpO2: 100%   Filed Weights   05/27/20 0812  Weight: 201 lb 3.2 oz (91.3 kg)   GENERAL:alert, no distress and comfortable SKIN: skin color, texture, turgor are normal, no rashes or significant lesions EYES: normal, Conjunctiva are pink and  non-injected, sclera clear NECK: supple, thyroid normal size, non-tender, without nodularity LYMPH:  no palpable lymphadenopathy in the cervical, axillary  LUNGS: clear to auscultation and percussion with normal breathing effort HEART: regular rate & rhythm and no murmurs and no lower extremity edema ABDOMEN:abdomen soft, non-tender and normal bowel sounds Musculoskeletal:no cyanosis of digits and no clubbing  NEURO: alert & oriented x 3 with fluent speech, no focal motor/sensory deficits Breasts: Breast inspection showed them to be symmetrical with no nipple discharge or skin change. Palpation of the breasts and axilla revealed a very soft mass about 3X4cm in RUQ, no other obvious mass that I could appreciate. No palpable axillary adenopathy    LABORATORY DATA:  I have reviewed the data as listed CBC Latest Ref Rng & Units 05/27/2020 05/06/2020 04/15/2020  WBC 4.0 - 10.5 K/uL 8.3 7.8 6.9  Hemoglobin 12.0 - 15.0 g/dL 11.0(L) 11.0(L) 11.4(L)  Hematocrit 36.0 - 46.0 % 35.5(L) 35.6(L) 36.4  Platelets 150 -  400 K/uL 326 355 323     CMP Latest Ref Rng & Units 05/27/2020 05/06/2020 04/15/2020  Glucose 70 - 99 mg/dL 97 113(H) 101(H)  BUN 6 - 20 mg/dL '14 12 13  ' Creatinine 0.44 - 1.00 mg/dL 0.83 0.88 0.88  Sodium 135 - 145 mmol/L 146(H) 143 142  Potassium 3.5 - 5.1 mmol/L 4.1 3.4(L) 3.7  Chloride 98 - 111 mmol/L 112(H) 112(H) 111  CO2 22 - 32 mmol/L '24 27 26  ' Calcium 8.9 - 10.3 mg/dL 8.9 8.9 9.0  Total Protein 6.5 - 8.1 g/dL 6.0(L) 6.0(L) 6.3(L)  Total Bilirubin 0.3 - 1.2 mg/dL 0.3 0.3 0.2(L)  Alkaline Phos 38 - 126 U/L 75 81 91  AST 15 - 41 U/L 15 13(L) 21  ALT 0 - 44 U/L 8 <6 10      RADIOGRAPHIC STUDIES: I have personally reviewed the radiological images as listed and agreed with the findings in the report. No results found.   ASSESSMENT & PLAN:  Carolyn Stewart is a 60 y.o. female with   1.Malignant neoplasm of upper-outer quadrant of right breast,ductal carcinoma,StageIIA,  c(T3N1Mx),with bone and possible liver mets,ER+/PR+/HER2+,GradeII-III -She was diagnosed in 11/2019 witha largeright breast mass in 4 quadrants measuringup to 11cm and right lymphadenopathy. Her right breast and LN biopsy was positive for invasiveductalcarcinoma with components of DCIS. Her left axillarynodebiopsy was benign. -9/9/21Liver biopsy was negativefor malignant cellsbut her 01/12/20 Bone Biopsy was positive for metastatic carcinoma from primary breast cancer. Her ER/PR/HER2 were all negative bone biopsy, possibly related to decalcification. -She was also seen to have left breast mass on her 12/12/19 MRI, but given metastatic disease, no further biopsy is needed due to her metastatic disease.  -Wepreviouslydiscussed that she has stage IV metastatic breast cancerwhichis not curable but very treatable.We do not plan toofferright breast surgery in the near future,butmay be an option down the road. -I started her onfirst lineTHP every 3 weeks on 01/03/20.Her restaging PET scan from April 28, 2020 showed no hypermetabolic lesions. Given her excellent response, will consider switch to maintenance therapy with Herceptin, Perjeta and AIafter 8 cycle chemo THP.  -She is tolerating chemotherapy very well, no significant side effects -Lab reviewed, adequate for treatment, will proceed with cycle 8 THP today -Follow-up in 3 weeks to start maintenance HP and AI. I will check if her insurance will approve Phesgo  -we reviewed benefit and AEs from AI, she is interested  -Given her bone metastasis, I also recommend zometa every 3 months on next visit.  Potential benefit and side effect discussed with her, she is interested.  No immediate dental concern.   2. History of vaginal prolaps -Her 01/09/20 ED visit indicated diarrhea as well as vaginal prolapse.The patient's diarrhea has since resolved.  -She will follow up with her OB/GYN regarding the vaginal prolapse.She was seen  byDr. Rip Harbour.She plans to have hysterectomy when she is off chemo  3. HTN -Well controlled on Metoprolol andAmlodipine. Continue to f/u with PCP  4. Goal of care discussion  -she is full code now    PLAN: -Labs reviewed, adequately for treatment. Will proceed with last cycle THP today -lab, flush and HP (Phesgo) in 3 weeks, will start AI on next visit -start Zometa infusion in 3 weeks    No problem-specific Assessment & Plan notes found for this encounter.   No orders of the defined types were placed in this encounter.  All questions were answered. The patient knows to call the clinic with any problems, questions or  concerns. No barriers to learning was detected. The total time spent in the appointment was 30 minutes.     Truitt Merle, MD 05/27/2020   I, Joslyn Devon, am acting as scribe for Truitt Merle, MD.   I have reviewed the above documentation for accuracy and completeness, and I agree with the above.

## 2020-05-27 ENCOUNTER — Encounter: Payer: Self-pay | Admitting: Hematology

## 2020-05-27 ENCOUNTER — Inpatient Hospital Stay: Payer: Commercial Managed Care - PPO

## 2020-05-27 ENCOUNTER — Inpatient Hospital Stay (HOSPITAL_BASED_OUTPATIENT_CLINIC_OR_DEPARTMENT_OTHER): Payer: Commercial Managed Care - PPO | Admitting: Hematology

## 2020-05-27 ENCOUNTER — Inpatient Hospital Stay: Payer: Commercial Managed Care - PPO | Attending: Hematology

## 2020-05-27 ENCOUNTER — Encounter: Payer: Self-pay | Admitting: *Deleted

## 2020-05-27 ENCOUNTER — Other Ambulatory Visit: Payer: Self-pay

## 2020-05-27 VITALS — BP 135/62 | HR 95 | Temp 98.1°F | Resp 20

## 2020-05-27 VITALS — BP 131/80 | HR 96 | Temp 97.4°F | Resp 15 | Ht 67.0 in | Wt 201.2 lb

## 2020-05-27 DIAGNOSIS — Z79899 Other long term (current) drug therapy: Secondary | ICD-10-CM | POA: Insufficient documentation

## 2020-05-27 DIAGNOSIS — N8110 Cystocele, unspecified: Secondary | ICD-10-CM | POA: Diagnosis not present

## 2020-05-27 DIAGNOSIS — C50411 Malignant neoplasm of upper-outer quadrant of right female breast: Secondary | ICD-10-CM

## 2020-05-27 DIAGNOSIS — Z170 Estrogen receptor positive status [ER+]: Secondary | ICD-10-CM

## 2020-05-27 DIAGNOSIS — Z79811 Long term (current) use of aromatase inhibitors: Secondary | ICD-10-CM | POA: Insufficient documentation

## 2020-05-27 DIAGNOSIS — Z5112 Encounter for antineoplastic immunotherapy: Secondary | ICD-10-CM | POA: Insufficient documentation

## 2020-05-27 DIAGNOSIS — Z5111 Encounter for antineoplastic chemotherapy: Secondary | ICD-10-CM | POA: Insufficient documentation

## 2020-05-27 DIAGNOSIS — Z9221 Personal history of antineoplastic chemotherapy: Secondary | ICD-10-CM | POA: Insufficient documentation

## 2020-05-27 DIAGNOSIS — C7951 Secondary malignant neoplasm of bone: Secondary | ICD-10-CM | POA: Diagnosis not present

## 2020-05-27 DIAGNOSIS — I10 Essential (primary) hypertension: Secondary | ICD-10-CM | POA: Diagnosis not present

## 2020-05-27 LAB — CMP (CANCER CENTER ONLY)
ALT: 8 U/L (ref 0–44)
AST: 15 U/L (ref 15–41)
Albumin: 3.4 g/dL — ABNORMAL LOW (ref 3.5–5.0)
Alkaline Phosphatase: 75 U/L (ref 38–126)
Anion gap: 10 (ref 5–15)
BUN: 14 mg/dL (ref 6–20)
CO2: 24 mmol/L (ref 22–32)
Calcium: 8.9 mg/dL (ref 8.9–10.3)
Chloride: 112 mmol/L — ABNORMAL HIGH (ref 98–111)
Creatinine: 0.83 mg/dL (ref 0.44–1.00)
GFR, Estimated: >60 mL/min (ref >60)
Glucose, Bld: 97 mg/dL (ref 70–99)
Potassium: 4.1 mmol/L (ref 3.5–5.1)
Sodium: 146 mmol/L — ABNORMAL HIGH (ref 135–145)
Total Bilirubin: 0.3 mg/dL (ref 0.3–1.2)
Total Protein: 6 g/dL — ABNORMAL LOW (ref 6.5–8.1)

## 2020-05-27 LAB — CBC WITH DIFFERENTIAL (CANCER CENTER ONLY)
Abs Immature Granulocytes: 0.06 10*3/uL (ref 0.00–0.07)
Basophils Absolute: 0 10*3/uL (ref 0.0–0.1)
Basophils Relative: 0 %
Eosinophils Absolute: 0.1 10*3/uL (ref 0.0–0.5)
Eosinophils Relative: 1 %
HCT: 35.5 % — ABNORMAL LOW (ref 36.0–46.0)
Hemoglobin: 11 g/dL — ABNORMAL LOW (ref 12.0–15.0)
Immature Granulocytes: 1 %
Lymphocytes Relative: 34 %
Lymphs Abs: 2.8 10*3/uL (ref 0.7–4.0)
MCH: 26.9 pg (ref 26.0–34.0)
MCHC: 31 g/dL (ref 30.0–36.0)
MCV: 86.8 fL (ref 80.0–100.0)
Monocytes Absolute: 1.3 10*3/uL — ABNORMAL HIGH (ref 0.1–1.0)
Monocytes Relative: 15 %
Neutro Abs: 4 10*3/uL (ref 1.7–7.7)
Neutrophils Relative %: 49 %
Platelet Count: 326 10*3/uL (ref 150–400)
RBC: 4.09 MIL/uL (ref 3.87–5.11)
RDW: 17.9 % — ABNORMAL HIGH (ref 11.5–15.5)
WBC Count: 8.3 10*3/uL (ref 4.0–10.5)
nRBC: 0 % (ref 0.0–0.2)

## 2020-05-27 MED ORDER — SODIUM CHLORIDE 0.9% FLUSH
10.0000 mL | Freq: Once | INTRAVENOUS | Status: AC
  Administered 2020-05-27: 10 mL
  Filled 2020-05-27: qty 10

## 2020-05-27 MED ORDER — DEXAMETHASONE SODIUM PHOSPHATE 100 MG/10ML IJ SOLN
10.0000 mg | Freq: Once | INTRAMUSCULAR | Status: AC
  Administered 2020-05-27: 10 mg via INTRAVENOUS
  Filled 2020-05-27: qty 10

## 2020-05-27 MED ORDER — SODIUM CHLORIDE 0.9 % IV SOLN
420.0000 mg | Freq: Once | INTRAVENOUS | Status: AC
  Administered 2020-05-27: 420 mg via INTRAVENOUS
  Filled 2020-05-27: qty 14

## 2020-05-27 MED ORDER — DIPHENHYDRAMINE HCL 25 MG PO CAPS
ORAL_CAPSULE | ORAL | Status: AC
  Filled 2020-05-27: qty 1

## 2020-05-27 MED ORDER — SODIUM CHLORIDE 0.9 % IV SOLN
75.0000 mg/m2 | Freq: Once | INTRAVENOUS | Status: AC
  Administered 2020-05-27: 140 mg via INTRAVENOUS
  Filled 2020-05-27: qty 14

## 2020-05-27 MED ORDER — HEPARIN SOD (PORK) LOCK FLUSH 100 UNIT/ML IV SOLN
500.0000 [IU] | Freq: Once | INTRAVENOUS | Status: AC | PRN
  Administered 2020-05-27: 500 [IU]
  Filled 2020-05-27: qty 5

## 2020-05-27 MED ORDER — PALONOSETRON HCL INJECTION 0.25 MG/5ML
0.2500 mg | Freq: Once | INTRAVENOUS | Status: AC
  Administered 2020-05-27: 0.25 mg via INTRAVENOUS

## 2020-05-27 MED ORDER — TRASTUZUMAB-ANNS CHEMO 150 MG IV SOLR
6.0000 mg/kg | Freq: Once | INTRAVENOUS | Status: AC
  Administered 2020-05-27: 525 mg via INTRAVENOUS
  Filled 2020-05-27: qty 25

## 2020-05-27 MED ORDER — PALONOSETRON HCL INJECTION 0.25 MG/5ML
INTRAVENOUS | Status: AC
  Filled 2020-05-27: qty 5

## 2020-05-27 MED ORDER — ACETAMINOPHEN 325 MG PO TABS
ORAL_TABLET | ORAL | Status: AC
  Filled 2020-05-27: qty 2

## 2020-05-27 MED ORDER — ACETAMINOPHEN 325 MG PO TABS
650.0000 mg | ORAL_TABLET | Freq: Once | ORAL | Status: AC
  Administered 2020-05-27: 650 mg via ORAL

## 2020-05-27 MED ORDER — SODIUM CHLORIDE 0.9 % IV SOLN
Freq: Once | INTRAVENOUS | Status: AC
  Filled 2020-05-27: qty 250

## 2020-05-27 MED ORDER — SODIUM CHLORIDE 0.9% FLUSH
10.0000 mL | INTRAVENOUS | Status: DC | PRN
  Administered 2020-05-27: 10 mL
  Filled 2020-05-27: qty 10

## 2020-05-27 MED ORDER — DIPHENHYDRAMINE HCL 25 MG PO CAPS
50.0000 mg | ORAL_CAPSULE | Freq: Once | ORAL | Status: AC
  Administered 2020-05-27: 50 mg via ORAL

## 2020-05-27 NOTE — Patient Instructions (Signed)
[facility] Discharge Instructions for Patients Receiving Chemotherapy  Today you received the following immunotherapy agents: Trastuzumab, Pertuzumab (Perjeta)  and chemotherapy agent: Docetaxel (Taxotere)  To help prevent nausea and vomiting after your treatment, we encourage you to take your nausea medication as directed by your MD.   If you develop nausea and vomiting that is not controlled by your nausea medication, call the clinic.   BELOW ARE SYMPTOMS THAT SHOULD BE REPORTED IMMEDIATELY:  *FEVER GREATER THAN 100.5 F  *CHILLS WITH OR WITHOUT FEVER  NAUSEA AND VOMITING THAT IS NOT CONTROLLED WITH YOUR NAUSEA MEDICATION  *UNUSUAL SHORTNESS OF BREATH  *UNUSUAL BRUISING OR BLEEDING  TENDERNESS IN MOUTH AND THROAT WITH OR WITHOUT PRESENCE OF ULCERS  *URINARY PROBLEMS  *BOWEL PROBLEMS  UNUSUAL RASH Items with * indicate a potential emergency and should be followed up as soon as possible.  Feel free to call the clinic should you have any questions or concerns. The clinic phone number is [phone].  Please show the [facility] at check-in to the Emergency Department and triage nurse.

## 2020-05-29 ENCOUNTER — Inpatient Hospital Stay: Payer: Commercial Managed Care - PPO

## 2020-06-03 ENCOUNTER — Encounter: Payer: Self-pay | Admitting: *Deleted

## 2020-06-15 ENCOUNTER — Telehealth: Payer: Self-pay | Admitting: Hematology

## 2020-06-15 NOTE — Telephone Encounter (Signed)
Changed upcoming infusion per provider's request. Patient is aware of changes.

## 2020-06-16 NOTE — Progress Notes (Addendum)
[place]   [phone] [phone]   Clinic Follow up Note   Patient Care Team: Julian Hy, PA-C as PCP - General (Physician Assistant) [name], MD as Consulting Physician (General Surgery) Truitt Merle, MD as Consulting Physician (Hematology) Kyung Rudd, MD as Consulting Physician (Radiation Oncology) Mauro Kaufmann, RN as Oncology Nurse Navigator Rockwell Germany, RN as Oncology Nurse Navigator  Date of Service:  06/18/2020  CHIEF COMPLAINT: F/u of metastatic right breast cancer  SUMMARY OF ONCOLOGIC HISTORY: Oncology History Overview Note  Cancer Staging Malignant neoplasm of upper-outer quadrant of right breast in female, estrogen receptor positive ([name]) Staging form: Breast, AJCC 8th Edition - Clinical stage from 11/25/2019: Stage IIA (cT3, cN1, cM0, G2, ER+, PR+, HER2+) - Signed by Truitt Merle, MD on 12/03/2019    Malignant neoplasm of upper-outer quadrant of right breast in female, estrogen receptor positive ([place])  11/13/2019 Mammogram    Diagnostic Mammogram 11/13/19  IMPRESSION: -Highly suspicious mass and calcifications in the right breast centered between 9 and 12 o'clock spanning up to 11 cm.  -Multiple masses in the right axilla. One of the masses contains calcifications, denoted as mass number 2 measuring 9 x 11 by 10 mm. Several of these masses do not appear to represent definitive lymph nodes. Others may be small but abnormal appearing lymph nodes. Taken in total, a cluster of masses in the right axilla spans up to 3.7 cm.  -There are fibrocystic changes on the left. There are also some solid-appearing masses. A solid appearing mass at 10:30, 6 cm from the nipple measures 9 x 6 x 6 mm. An adjacent solid mass at 10 o'clock, 6 cm from the nipple measures 8 x 6 by 5 mm. Another solid-appearing mass at 11 o'clock, 1 cm from the nipple measures 5 x 4 by 5 mm. Fibrocystic changes are seen at 10 o'clock, 12 o'clock, and 4  o'clock. Another solid-appearing mass seen at 5 o'clock, 4 cm from the nipple measuring 6 x 3 x 4 mm. There is a single abnormal node in the left axilla with a cortex measuring 5 mm and restriction of the fatty hilum.   11/25/2019 Initial Biopsy   Diagnosis 1. Breast, right, needle core biopsy, upper outer - INVASIVE MAMMARY CARCINOMA - MAMMARY CARCINOMA IN SITU WITH CALCIFICATIONS AND NECROSIS - SEE COMMENT 2. Lymph node, needle/core biopsy, right axilla - INVASIVE MAMMARY CARCINOMA - SEE COMMENT 3. Lymph node, needle/core biopsy, left axilla - BENIGN LYMPH NODE - NO CARCINOMA IDENTIFIED Microscopic Comment 1. The biopsy material shows an infiltrative proliferation of cells arranged linearly and in small clusters. Based on the biopsy, the carcinoma appears Nottingham grade 2 of 3 and measures 1.2 cm in greatest linear extent. E-cadherin and prognostic markers (ER/PR/ki-67/HER2)are pending and will be reported in an addendum. Dr. Tresa Moore reviewed the case and agrees with the above diagnosis. These results were called to [facility] on November 26, 2019. 2. Definitive nodal tissue is not identified. This biopsy has a slightly different architecture morphology than what is seen in part 1. The carcinoma measures 0.7 cm in greatest dimension. E-cadherin and prognostic markers (ER, PR, Ki-67 and HER-2) are pending and will be reported in an addendum.   11/25/2019 Receptors her2   1. PROGNOSTIC INDICATORS Results: IMMUNOHISTOCHEMICAL AND MORPHOMETRIC ANALYSIS PERFORMED MANUALLY The tumor cells are POSITIVE for Her2 (3+). Estrogen Receptor: 90%, POSITIVE, STRONG STAINING INTENSITY Progesterone Receptor: 20%, POSITIVE, STRONG STAINING INTENSITY Proliferation Marker Ki67: 30%  2. PROGNOSTIC INDICATORS Results: IMMUNOHISTOCHEMICAL AND MORPHOMETRIC ANALYSIS PERFORMED MANUALLY The tumor cells are POSITIVE for Her2 (3+). Estrogen Receptor: 90%, POSITIVE, STRONG STAINING  INTENSITY Progesterone Receptor: 25%, POSITIVE, STRONG STAINING INTENSITY Proliferation Marker Ki67: 30%   11/25/2019 Cancer Staging   Staging form: Breast, AJCC 8th Edition - Clinical stage from 11/25/2019: Stage IIA (cT3, cN1, cM0, G2, ER+, PR+, HER2+) - Signed by Truitt Merle, MD on 12/03/2019   12/01/2019 Initial Diagnosis   Malignant neoplasm of upper-outer quadrant of right breast in female, estrogen receptor positive ([place])   12/10/2019 Imaging   Bone Scan  IMPRESSION: Findings concerning for bony metastatic disease in the left acetabulum region, L3 vertebral body region, and anterolateral right tenth rib.   Increased uptake in several large joints likely is of arthropathic etiology.   12/11/2019 Imaging   CT CAP w contrast  IMPRESSION: Prominent glandular tissue in the central right breast with nipple retraction, likely corresponding to the patient's known right breast neoplasm.   Prominent right axillary nodes measuring up to 8 mm short axis, suspicious for nodal metastases in this clinical context.   3 mm subpleural pulmonary nodule in the right lower lobe, likely benign. However, follow-up CT chest is suggested in 3-6 months.   Multiple hepatic lesions, including a dominant 2.4 cm lesion in segment 6, which is considered indeterminate. Metastasis is not excluded. Consider MRI abdomen with/without contrast for further characterization.   11 mm sclerotic lesion along the left posterior aspect of the T7 vertebral body raises concern for isolated osseous metastasis. Correlate with priors if available. Benign vertebral hemangioma at T11.   12/12/2019 Breast MRI   IMPRESSION: 1. The biopsy proven malignancy in the right breast involves all 4 quadrants and spans up to 9 cm by MRI. There is enhancement within the skin at the level of the nipple.   2. Multiple matted masses are seen in the right axilla, either abnormal metastatic lymph nodes or a combination of masses  and metastatic lymph nodes. One of these has been previously biopsied showing invasive mammary carcinoma without definitive nodal tissue.   3.  There is a 1.5 cm Rotter's lymph node on the right.   4. There is markedly abnormal enhancement and edema in the right pectoralis major muscle spanning 6-7 cm, suspicious for metastatic disease.   5. There is diffuse nodular enhancement throughout the left breast, which may correspond with the solid-appearing masses seen on ultrasound. These all have a similar appearance on MRI.   6.  No findings of left axillary lymphadenopathy.   12/15/2019 Procedure   PAC placed    12/24/2019 Imaging   MRI abdomen  IMPRESSION: 1. Multifocal enhancing bone lesions are noted within the lumbar spine, and bony pelvis compatible with metastatic disease. 2. Four, small peripherally enhancing cystic lesions are noted within the liver worrisome for metastatic disease. 3. 2 enhancing liver lesions are also noted with signal and enhancement characteristics consistent with benign liver hemangioma. 4. Well-circumscribed, enhancing T2 and T1 hypointense structure within the right adnexal region is indeterminate. It is unclear whether not this is arising from the right ovary, broad ligament or right side of uterine fundus. Differential considerations include fibrothecoma, versus pedunculated uterine leiomyoma. Metastatic disease is considered less favored. Attention on follow-up imaging is recommended.     01/03/2020 - 05/27/2020 Chemotherapy   First line THP q3weeks starting 01/03/20-05/27/20   01/05/2020 Imaging   US Abdomen  IMPRESSION: 1. Suspect hematoma in the medial right lobe of the liver in the  area of previous biopsy. Note that recent MR does not show lesion in this area. This area has ill-defined margins and measures 3.9 x 4.0 x 3.5 cm. No perihepatic fluid.   2. Probable hemangiomas elsewhere in the right lobe of the liver. Note that several smaller  lesions seen on recent MR are not appreciable by ultrasound.   3.  Study otherwise unremarkable.   01/12/2020 Pathology Results   FINAL MICROSCOPIC DIAGNOSIS:   A. BONE, SCLEROTIC LESION, LEFT ANTERIOR ILIAC SPINE, BIOPSY:  - Metastatic carcinoma, consistent with patient's clinical history of  primary breast carcinoma  - See comment   COMMENT:  Immunohistochemical stains show that the tumor cells are positive for  CK7 and GATA3; and negative for CK20, consistent with above  interpretation.   PROGNOSTIC INDICATOR RESULTS:  The tumor cells are NEGATIVE for Her2 (0); see comment  Estrogen Receptor: NEGATIVE; see comment  Progesterone Receptor: NEGATIVE; see comment    03/23/2020 Imaging   CT CAP IMPRESSION: 1. Right lower lobe perifissural nodule is slightly more conspicuous on today's study. Close attention on follow-up recommended. 2. Interval progression of segment IV liver lesion, concerning for metastatic progression. The remaining visualized liver lesions are stable to smaller in the interval. 3. Interval evolution in appearance of thoracolumbar spinal lesions and left iliac crest lesion, potentially reflecting response to therapy/healing. No definite new osseous lesions on today's study. 4. New small fluid collection right cul-de-sac. 5. Probable fibroid change in the uterus including exophytic right fundal lesion. 6. Aortic Atherosclerosis (ICD10-I70.0).   03/31/2020 Imaging   MR ABD IMPRESSION: 1. Enlarging hepatic lesion in hepatic subsegment IV suspicious for hepatic metastasis. The it is uncertain whether the change in the short interval is due to technical factors with different modalities or true enlargement. 2. Lesion in the posterior RIGHT hepatic lobe with imaging features that are atypical for hemangioma but stability and signs of enhancement on later phase raising this question. Another more classic appearing may angioma seen inferior to this location  in hepatic subsegment VI. Attention on follow-up. 3. Heterogeneous enhancement throughout the spine particularly at L3 as exhibited on the prior study with very patchy marrow signal remains suspicious for bony metastatic lesions in this patient with known bone metastases.   04/01/2020 Imaging   Bone scan IMPRESSION: Good response to therapy with resolution of sites of uptake seen previously at the cervical spine and posterior RIGHT tenth rib as well as a diminished degree of uptake at remaining previously identified osseous metastatic foci.   06/18/2020 -  Chemotherapy   Given her excellent response, I switched to maintenance therapy with Herceptin, Perjeta (Phesgo) q3weeks and Letrozole.   06/18/2020 -  Chemotherapy   Zometa starting 06/18/20   06/18/2020 -  Anti-estrogen oral therapy   Letrozole 2.38m once dialy.       CURRENT THERAPY:  Maintenance Herceptin and Perjeta (Phesgo) q3weeks starting 06/18/20  Letrozole 2.549mdaily starting 06/19/23 Zometa starting 06/18/20   INTERVAL HISTORY:  KiMadicyn Stewart here for a follow up. She presents to the clinic alone. She notes hot flashes started 2 year. She is agreeable with proceeding with maintenance treatments.    REVIEW OF SYSTEMS:   Constitutional: Denies fevers, chills or abnormal weight loss Eyes: Denies blurriness of vision Ears, nose, mouth, throat, and face: Denies mucositis or sore throat Respiratory: Denies cough, dyspnea or wheezes Cardiovascular: Denies palpitation, chest discomfort or lower extremity swelling Gastrointestinal:  Denies nausea, heartburn or change in bowel habits Skin: Denies abnormal skin  rashes Lymphatics: Denies new lymphadenopathy or easy bruising Neurological:Denies numbness, tingling or new weaknesses Behavioral/Psych: Mood is stable, no new changes  All other systems were reviewed with the patient and are negative.  MEDICAL HISTORY:  Past Medical History:  Diagnosis Date  . Cancer ([place])  2021  . Hypertension     SURGICAL HISTORY: Past Surgical History:  Procedure Laterality Date  . left parotid tumor removal   2006  . PORTACATH PLACEMENT Left 12/15/2019   Procedure: INSERTION PORT-A-CATH WITH ULTRASOUND GUIDANCE;  Surgeon: [name], MD;  Location: [facility];  Service: General;  Laterality: Left;  . TONSILLECTOMY      I have reviewed the social history and family history with the patient and they are unchanged from previous note.  ALLERGIES:  has No Known Allergies.  MEDICATIONS:  Current Outpatient Medications  Medication Sig Dispense Refill  . letrozole (FEMARA) 2.5 MG tablet Take 1 tablet (2.5 mg total) by mouth daily. 30 tablet 3  . acetaminophen (TYLENOL) 500 MG tablet Take 1,000 mg by mouth every 6 (six) hours as needed for moderate pain or headache.    Marland Kitchen amLODipine (NORVASC) 10 MG tablet Take 10 mg by mouth daily.    . Cholecalciferol (VITAMIN D3) 50 MCG (2000 UT) TABS Take 2,000 Units by mouth daily.     Marland Kitchen KLOR-CON M20 20 MEQ tablet TAKE 1 TABLET BY MOUTH EVERY DAY 90 tablet 1  . metoprolol succinate (TOPROL-XL) 50 MG 24 hr tablet Take 50 mg by mouth daily.    Marland Kitchen omeprazole (PRILOSEC OTC) 20 MG tablet Take 20 mg by mouth daily.    Marland Kitchen oxyCODONE (OXY IR/ROXICODONE) 5 MG immediate release tablet Take 1 tablet (5 mg total) by mouth every 6 (six) hours as needed for severe pain. (Patient not taking: Reported on 12/30/2019) 10 tablet 0   No current facility-administered medications for this visit.   Facility-Administered Medications Ordered in Other Visits  Medication Dose Route Frequency Provider Last Rate Last Admin  . 0.9 %  sodium chloride infusion   Intravenous Once Truitt Merle, MD      . acetaminophen (TYLENOL) tablet 650 mg  650 mg Oral Once Truitt Merle, MD      . diphenhydrAMINE (BENADRYL) capsule 50 mg  50 mg Oral Once Truitt Merle, MD      . heparin lock flush 100 unit/mL  500 Units Intracatheter Once PRN Truitt Merle, MD      . pertuz-trastuz-hyaluron-zzxf ([place])  [phone] MG-MG-U/10ML chemo SQ injection maintenance dose 10 mL  10 mL Subcutaneous Once Truitt Merle, MD      . sodium chloride flush (NS) 0.9 % injection 10 mL  10 mL Intracatheter PRN Truitt Merle, MD        PHYSICAL EXAMINATION: ECOG PERFORMANCE STATUS: 1 - Symptomatic but completely ambulatory  Vitals:   06/18/20 0902  BP: 128/74  Pulse: 86  Resp: 18  Temp: 97.7 F (36.5 C)  SpO2: 99%   Filed Weights   06/18/20 0902  Weight: 199 lb 8 oz (90.5 kg)     Due to COVID19 we will limit examination to appearance. Patient had no complaints.  GENERAL:alert, no distress and comfortable SKIN: skin color normal, no rashes or significant lesions EYES: normal, Conjunctiva are pink and non-injected, sclera clear  NEURO: alert & oriented x 3 with fluent speech   LABORATORY DATA:  I have reviewed the data as listed CBC Latest Ref Rng & Units 06/18/2020 05/27/2020 05/06/2020  WBC 4.0 - 10.5 K/uL 6.9 8.3  7.8  Hemoglobin 12.0 - 15.0 g/dL 11.1(L) 11.0(L) 11.0(L)  Hematocrit 36.0 - 46.0 % 35.1(L) 35.5(L) 35.6(L)  Platelets 150 - 400 K/uL 320 326 355     CMP Latest Ref Rng & Units 06/18/2020 05/27/2020 05/06/2020  Glucose 70 - 99 mg/dL 102(H) 97 113(H)  BUN 6 - 20 mg/dL '10 14 12  ' Creatinine 0.44 - 1.00 mg/dL 0.93 0.83 0.88  Sodium 135 - 145 mmol/L 141 146(H) 143  Potassium 3.5 - 5.1 mmol/L 3.7 4.1 3.4(L)  Chloride 98 - 111 mmol/L 110 112(H) 112(H)  CO2 22 - 32 mmol/L '26 24 27  ' Calcium 8.9 - 10.3 mg/dL 9.0 8.9 8.9  Total Protein 6.5 - 8.1 g/dL 5.9(L) 6.0(L) 6.0(L)  Total Bilirubin 0.3 - 1.2 mg/dL 0.3 0.3 0.3  Alkaline Phos 38 - 126 U/L 73 75 81  AST 15 - 41 U/L 15 15 13(L)  ALT 0 - 44 U/L <6 8 <6      RADIOGRAPHIC STUDIES: I have personally reviewed the radiological images as listed and agreed with the findings in the report. No results found.   ASSESSMENT & PLAN:  Carolyn Stewart is a 60 y.o. female with   1.Malignant neoplasm of upper-outer quadrant of right breast,ductal  carcinoma,StageIIA, c(T3N1Mx),with bone and possible liver mets,ER+/PR+/HER2+,GradeII-III -She was diagnosed in 11/2019 witha largeright breast mass in 4 quadrants measuringup to 11cm and right lymphadenopathy.Her right breast and LN biopsy was positive for invasiveductalcarcinoma with components of DCIS.Her left axillarynodebiopsy was benign. -9/9/21Liver biopsy was negativefor malignant cellsbut her 01/12/20 Bone Biopsy was positive for metastatic carcinoma from primary breast cancer. Her ER/PR/HER2 were all negative bone biopsy, possibly related to decalcification. -She was also seen to have left breast mass on her 12/12/19 MRI, but given metastatic disease, no furtherbiopsy is needed due to her metastatic disease.  -Wepreviouslydiscussed that she has stage IV metastatic breast cancerwhichis not curable but very treatable.We do not plan toofferright breast surgery in the near future,butmay be an option down the road. -She completed THP on 01/03/20-05/27/20. Her restaging PET scan from April 28, 2020 showed no hypermetabolic lesions. Given her excellent response, I switched to maintenance therapy with Herceptin, Perjeta (Phesgo) q3weeks and Letrozole starting 06/18/20. She is agreeable, plan to start.  --The potential benefit and side effects of letrozole, which includes but not limited to, hot flash, skin and vaginal dryness, metabolic changes ( increased blood glucose, cholesterol, weight, etc.), slightly in increased risk of cardiovascular disease, cataracts, muscular and joint discomfort, osteopenia and osteoporosis, etc, were discussed with her in great details. She is interested, and we'll start now  -the goal of therapy is disease control, not cure  -Labs are stable and adequate to proceed with Phesgo today.  -I also started him on Zometa today (06/18/20). Benefit and side effect, especially fatigue, bone pain, flu like symptoms and risk of jaw necrosis were discussed with  her in detail, she agrees to proceed.  I encouraged her to keep up with dental care. She has no active dental issues. She is on calcium and VitD -F/u in 6 weeks    3. HTN -Well controlled on Metoprolol andAmlodipine. Continue to f/u with PCP  4. Goal of care discussion  -she is full code now    PLAN: -Labs reviewed and adequate to proceed with HP (Phesgo) injection and zometa today  -Start Letrozole. Called in to her pharmacy today  -lab, flush and HP (Phesgo) in 3 and 6 weeks -F/u in 6 weeks.  -will discuss genetic testing with  her next time    No problem-specific Assessment & Plan notes found for this encounter.   No orders of the defined types were placed in this encounter.  All questions were answered. The patient knows to call the clinic with any problems, questions or concerns. No barriers to learning was detected. The total time spent in the appointment was 30 minutes.     Truitt Merle, MD 06/18/2020   I, Joslyn Devon, am acting as scribe for Truitt Merle, MD.   I have reviewed the above documentation for accuracy and completeness, and I agree with the above.

## 2020-06-18 ENCOUNTER — Encounter: Payer: Self-pay | Admitting: Hematology

## 2020-06-18 ENCOUNTER — Inpatient Hospital Stay: Payer: Commercial Managed Care - PPO

## 2020-06-18 ENCOUNTER — Telehealth: Payer: Self-pay | Admitting: Hematology

## 2020-06-18 ENCOUNTER — Inpatient Hospital Stay (HOSPITAL_BASED_OUTPATIENT_CLINIC_OR_DEPARTMENT_OTHER): Payer: Commercial Managed Care - PPO | Admitting: Hematology

## 2020-06-18 ENCOUNTER — Encounter: Payer: Self-pay | Admitting: *Deleted

## 2020-06-18 ENCOUNTER — Other Ambulatory Visit: Payer: Self-pay

## 2020-06-18 VITALS — BP 128/74 | HR 86 | Temp 97.7°F | Resp 18 | Ht 67.0 in | Wt 199.5 lb

## 2020-06-18 DIAGNOSIS — Z170 Estrogen receptor positive status [ER+]: Secondary | ICD-10-CM | POA: Diagnosis not present

## 2020-06-18 DIAGNOSIS — Z5112 Encounter for antineoplastic immunotherapy: Secondary | ICD-10-CM | POA: Diagnosis not present

## 2020-06-18 DIAGNOSIS — C50411 Malignant neoplasm of upper-outer quadrant of right female breast: Secondary | ICD-10-CM

## 2020-06-18 LAB — CMP (CANCER CENTER ONLY)
ALT: <6 U/L (ref 0–44)
AST: 15 U/L (ref 15–41)
Albumin: 3.3 g/dL — ABNORMAL LOW (ref 3.5–5.0)
Alkaline Phosphatase: 73 U/L (ref 38–126)
Anion gap: 5 (ref 5–15)
BUN: 10 mg/dL (ref 6–20)
CO2: 26 mmol/L (ref 22–32)
Calcium: 9 mg/dL (ref 8.9–10.3)
Chloride: 110 mmol/L (ref 98–111)
Creatinine: 0.93 mg/dL (ref 0.44–1.00)
GFR, Estimated: >60 mL/min (ref >60)
Glucose, Bld: 102 mg/dL — ABNORMAL HIGH (ref 70–99)
Potassium: 3.7 mmol/L (ref 3.5–5.1)
Sodium: 141 mmol/L (ref 135–145)
Total Bilirubin: 0.3 mg/dL (ref 0.3–1.2)
Total Protein: 5.9 g/dL — ABNORMAL LOW (ref 6.5–8.1)

## 2020-06-18 LAB — CBC WITH DIFFERENTIAL (CANCER CENTER ONLY)
Abs Immature Granulocytes: 0.03 10*3/uL (ref 0.00–0.07)
Basophils Absolute: 0 10*3/uL (ref 0.0–0.1)
Basophils Relative: 0 %
Eosinophils Absolute: 0.1 10*3/uL (ref 0.0–0.5)
Eosinophils Relative: 1 %
HCT: 35.1 % — ABNORMAL LOW (ref 36.0–46.0)
Hemoglobin: 11.1 g/dL — ABNORMAL LOW (ref 12.0–15.0)
Immature Granulocytes: 0 %
Lymphocytes Relative: 31 %
Lymphs Abs: 2.2 10*3/uL (ref 0.7–4.0)
MCH: 27.2 pg (ref 26.0–34.0)
MCHC: 31.6 g/dL (ref 30.0–36.0)
MCV: 86 fL (ref 80.0–100.0)
Monocytes Absolute: 1 10*3/uL (ref 0.1–1.0)
Monocytes Relative: 14 %
Neutro Abs: 3.7 10*3/uL (ref 1.7–7.7)
Neutrophils Relative %: 54 %
Platelet Count: 320 10*3/uL (ref 150–400)
RBC: 4.08 MIL/uL (ref 3.87–5.11)
RDW: 18.9 % — ABNORMAL HIGH (ref 11.5–15.5)
WBC Count: 6.9 10*3/uL (ref 4.0–10.5)
nRBC: 0 % (ref 0.0–0.2)

## 2020-06-18 MED ORDER — SODIUM CHLORIDE 0.9% FLUSH
10.0000 mL | INTRAVENOUS | Status: DC | PRN
  Administered 2020-06-18: 10 mL
  Filled 2020-06-18: qty 10

## 2020-06-18 MED ORDER — ZOLEDRONIC ACID 4 MG/100ML IV SOLN
INTRAVENOUS | Status: AC
  Filled 2020-06-18: qty 100

## 2020-06-18 MED ORDER — ACETAMINOPHEN 325 MG PO TABS
ORAL_TABLET | ORAL | Status: AC
  Filled 2020-06-18: qty 2

## 2020-06-18 MED ORDER — DIPHENHYDRAMINE HCL 25 MG PO CAPS
ORAL_CAPSULE | ORAL | Status: AC
  Filled 2020-06-18: qty 2

## 2020-06-18 MED ORDER — PERTUZ-TRASTUZ-HYALURON-ZZXF 60-60-2000 MG-MG-U/ML CHEMO ~~LOC~~ SOLN
10.0000 mL | Freq: Once | SUBCUTANEOUS | Status: AC
  Administered 2020-06-18: 10 mL via SUBCUTANEOUS
  Filled 2020-06-18: qty 10

## 2020-06-18 MED ORDER — HEPARIN SOD (PORK) LOCK FLUSH 100 UNIT/ML IV SOLN
500.0000 [IU] | Freq: Once | INTRAVENOUS | Status: AC | PRN
  Administered 2020-06-18: 500 [IU]
  Filled 2020-06-18: qty 5

## 2020-06-18 MED ORDER — ZOLEDRONIC ACID 4 MG/100ML IV SOLN
4.0000 mg | Freq: Once | INTRAVENOUS | Status: AC
  Administered 2020-06-18: 4 mg via INTRAVENOUS

## 2020-06-18 MED ORDER — LETROZOLE 2.5 MG PO TABS: 2.5000 mg | ORAL_TABLET | Freq: Every day | ORAL | 3 refills | Status: DC

## 2020-06-18 MED ORDER — ACETAMINOPHEN 325 MG PO TABS
650.0000 mg | ORAL_TABLET | Freq: Once | ORAL | Status: AC
  Administered 2020-06-18: 650 mg via ORAL

## 2020-06-18 MED ORDER — SODIUM CHLORIDE 0.9 % IV SOLN
Freq: Once | INTRAVENOUS | Status: AC
  Filled 2020-06-18: qty 250

## 2020-06-18 MED ORDER — DIPHENHYDRAMINE HCL 25 MG PO CAPS
50.0000 mg | ORAL_CAPSULE | Freq: Once | ORAL | Status: AC
  Administered 2020-06-18: 50 mg via ORAL

## 2020-06-18 MED ORDER — SODIUM CHLORIDE 0.9% FLUSH
10.0000 mL | Freq: Once | INTRAVENOUS | Status: AC
  Administered 2020-06-18: 10 mL
  Filled 2020-06-18: qty 10

## 2020-06-18 NOTE — Patient Instructions (Signed)
[facility] Discharge Instructions for Patients Receiving Chemotherapy  Today you received the following chemotherapy agents: Phesgo and Zometa  To help prevent nausea and vomiting after your treatment, we encourage you to take your nausea medication as directed   If you develop nausea and vomiting that is not controlled by your nausea medication, call the clinic.   BELOW ARE SYMPTOMS THAT SHOULD BE REPORTED IMMEDIATELY:  *FEVER GREATER THAN 100.5 F  *CHILLS WITH OR WITHOUT FEVER  NAUSEA AND VOMITING THAT IS NOT CONTROLLED WITH YOUR NAUSEA MEDICATION  *UNUSUAL SHORTNESS OF BREATH  *UNUSUAL BRUISING OR BLEEDING  TENDERNESS IN MOUTH AND THROAT WITH OR WITHOUT PRESENCE OF ULCERS  *URINARY PROBLEMS  *BOWEL PROBLEMS  UNUSUAL RASH Items with * indicate a potential emergency and should be followed up as soon as possible.  Feel free to call the clinic should you have any questions or concerns. The clinic phone number is [phone].  Please show the [facility] at check-in to the Emergency Department and triage nurse.

## 2020-06-18 NOTE — Patient Instructions (Signed)
Implanted Port Insertion, Care After This sheet gives you information about how to care for yourself after your procedure. Your health care provider may also give you more specific instructions. If you have problems or questions, contact your health care provider. What can I expect after the procedure? After the procedure, it is common to have:  Discomfort at the port insertion site.  Bruising on the skin over the port. This should improve over 3-4 days. Follow these instructions at home: Port care  After your port is placed, you will get a manufacturer's information card. The card has information about your port. Keep this card with you at all times.  Take care of the port as told by your health care provider. Ask your health care provider if you or a family member can get training for taking care of the port at home. A home health care nurse may also take care of the port.  Make sure to remember what type of port you have. Incision care  Follow instructions from your health care provider about how to take care of your port insertion site. Make sure you: ? Wash your hands with soap and water before and after you change your bandage (dressing). If soap and water are not available, use hand sanitizer. ? Change your dressing as told by your health care provider. ? Leave stitches (sutures), skin glue, or adhesive strips in place. These skin closures may need to stay in place for 2 weeks or longer. If adhesive strip edges start to loosen and curl up, you may trim the loose edges. Do not remove adhesive strips completely unless your health care provider tells you to do that.  Check your port insertion site every day for signs of infection. Check for: ? Redness, swelling, or pain. ? Fluid or blood. ? Warmth. ? Pus or a bad smell.      Activity  Return to your normal activities as told by your health care provider. Ask your health care provider what activities are safe for you.  Do not  lift anything that is heavier than 10 lb (4.5 kg), or the limit that you are told, until your health care provider says that it is safe. General instructions  Take over-the-counter and prescription medicines only as told by your health care provider.  Do not take baths, swim, or use a hot tub until your health care provider approves. Ask your health care provider if you may take showers. You may only be allowed to take sponge baths.  Do not drive for 24 hours if you were given a sedative during your procedure.  Wear a medical alert bracelet in case of an emergency. This will tell any health care providers that you have a port.  Keep all follow-up visits as told by your health care provider. This is important. Contact a health care provider if:  You cannot flush your port with saline as directed, or you cannot draw blood from the port.  You have a fever or chills.  You have redness, swelling, or pain around your port insertion site.  You have fluid or blood coming from your port insertion site.  Your port insertion site feels warm to the touch.  You have pus or a bad smell coming from the port insertion site. Get help right away if:  You have chest pain or shortness of breath.  You have bleeding from your port that you cannot control. Summary  Take care of the port as told by your   health care provider. Keep the manufacturer's information card with you at all times.  Change your dressing as told by your health care provider.  Contact a health care provider if you have a fever or chills or if you have redness, swelling, or pain around your port insertion site.  Keep all follow-up visits as told by your health care provider. This information is not intended to replace advice given to you by your health care provider. Make sure you discuss any questions you have with your health care provider. Document Revised: 11/06/2017 Document Reviewed: 11/06/2017 Elsevier Patient Education   2021 [employer]  

## 2020-06-18 NOTE — Telephone Encounter (Signed)
Scheduled follow-up appointments per 2/25 los. Patient is aware.

## 2020-06-19 LAB — CANCER ANTIGEN 27.29: CA 27.29: 15.8 U/mL (ref 0.0–38.6)

## 2020-07-08 ENCOUNTER — Inpatient Hospital Stay: Payer: Commercial Managed Care - PPO

## 2020-07-08 ENCOUNTER — Inpatient Hospital Stay: Payer: Commercial Managed Care - PPO | Attending: Hematology

## 2020-07-08 ENCOUNTER — Other Ambulatory Visit: Payer: Self-pay

## 2020-07-08 VITALS — BP 149/73 | HR 95 | Temp 98.2°F | Resp 18

## 2020-07-08 DIAGNOSIS — Z79811 Long term (current) use of aromatase inhibitors: Secondary | ICD-10-CM | POA: Diagnosis not present

## 2020-07-08 DIAGNOSIS — Z79899 Other long term (current) drug therapy: Secondary | ICD-10-CM | POA: Diagnosis not present

## 2020-07-08 DIAGNOSIS — C787 Secondary malignant neoplasm of liver and intrahepatic bile duct: Secondary | ICD-10-CM | POA: Diagnosis not present

## 2020-07-08 DIAGNOSIS — I10 Essential (primary) hypertension: Secondary | ICD-10-CM | POA: Insufficient documentation

## 2020-07-08 DIAGNOSIS — C7951 Secondary malignant neoplasm of bone: Secondary | ICD-10-CM | POA: Diagnosis not present

## 2020-07-08 DIAGNOSIS — C50411 Malignant neoplasm of upper-outer quadrant of right female breast: Secondary | ICD-10-CM

## 2020-07-08 DIAGNOSIS — Z9221 Personal history of antineoplastic chemotherapy: Secondary | ICD-10-CM | POA: Insufficient documentation

## 2020-07-08 DIAGNOSIS — Z5111 Encounter for antineoplastic chemotherapy: Secondary | ICD-10-CM | POA: Insufficient documentation

## 2020-07-08 DIAGNOSIS — Z170 Estrogen receptor positive status [ER+]: Secondary | ICD-10-CM | POA: Diagnosis not present

## 2020-07-08 MED ORDER — ACETAMINOPHEN 325 MG PO TABS
ORAL_TABLET | ORAL | Status: AC
  Filled 2020-07-08: qty 2

## 2020-07-08 MED ORDER — PERTUZ-TRASTUZ-HYALURON-ZZXF 60-60-2000 MG-MG-U/ML CHEMO ~~LOC~~ SOLN
10.0000 mL | Freq: Once | SUBCUTANEOUS | Status: AC
  Administered 2020-07-08: 10 mL via SUBCUTANEOUS
  Filled 2020-07-08: qty 10

## 2020-07-08 MED ORDER — DIPHENHYDRAMINE HCL 25 MG PO CAPS
ORAL_CAPSULE | ORAL | Status: AC
  Filled 2020-07-08: qty 2

## 2020-07-08 MED ORDER — ACETAMINOPHEN 325 MG PO TABS
650.0000 mg | ORAL_TABLET | Freq: Once | ORAL | Status: AC
Start: 2020-07-08 — End: 2020-07-08
  Administered 2020-07-08: 650 mg via ORAL

## 2020-07-08 MED ORDER — DIPHENHYDRAMINE HCL 25 MG PO CAPS
50.0000 mg | ORAL_CAPSULE | Freq: Once | ORAL | Status: AC
  Administered 2020-07-08: 50 mg via ORAL

## 2020-07-08 NOTE — Patient Instructions (Signed)
[facility] Discharge Instructions for Patients Receiving Chemotherapy  Today you received the following chemotherapy agents: Phesgo  To help prevent nausea and vomiting after your treatment, we encourage you to take your nausea medication as directed   If you develop nausea and vomiting that is not controlled by your nausea medication, call the clinic.   BELOW ARE SYMPTOMS THAT SHOULD BE REPORTED IMMEDIATELY:  *FEVER GREATER THAN 100.5 F  *CHILLS WITH OR WITHOUT FEVER  NAUSEA AND VOMITING THAT IS NOT CONTROLLED WITH YOUR NAUSEA MEDICATION  *UNUSUAL SHORTNESS OF BREATH  *UNUSUAL BRUISING OR BLEEDING  TENDERNESS IN MOUTH AND THROAT WITH OR WITHOUT PRESENCE OF ULCERS  *URINARY PROBLEMS  *BOWEL PROBLEMS  UNUSUAL RASH Items with * indicate a potential emergency and should be followed up as soon as possible.  Feel free to call the clinic should you have any questions or concerns. The clinic phone number is [phone].  Please show the [facility] at check-in to the Emergency Department and triage nurse.

## 2020-07-08 NOTE — Patient Instructions (Signed)
[facility] Discharge Instructions for Patients Receiving Chemotherapy  Today you received the following chemotherapy agents: Phesgo  To help prevent nausea and vomiting after your treatment, we encourage you to take your nausea medication as directed   If you develop nausea and vomiting that is not controlled by your nausea medication, call the clinic.   BELOW ARE SYMPTOMS THAT SHOULD BE REPORTED IMMEDIATELY:  *FEVER GREATER THAN 100.5 F  *CHILLS WITH OR WITHOUT FEVER  NAUSEA AND VOMITING THAT IS NOT CONTROLLED WITH YOUR NAUSEA MEDICATION  *UNUSUAL SHORTNESS OF BREATH  *UNUSUAL BRUISING OR BLEEDING  TENDERNESS IN MOUTH AND THROAT WITH OR WITHOUT PRESENCE OF ULCERS  *URINARY PROBLEMS  *BOWEL PROBLEMS  UNUSUAL RASH Items with * indicate a potential emergency and should be followed up as soon as possible.  Feel free to call the clinic should you have any questions or concerns. The clinic phone number is [phone].  Please show the [facility] at check-in to the Emergency Department and triage nurse.

## 2020-07-08 NOTE — Progress Notes (Signed)
Patient observed for 15 minutes post phesgo injection, no reaction noted.  DC'd in stable condition.

## 2020-07-22 NOTE — Progress Notes (Signed)
Carolyn Stewart   [phone] [phone]   Clinic Follow up Note   Patient Care Team: Julian Hy, PA-C as PCP - General (Physician Assistant) [name], MD as Consulting Physician (General Surgery) Truitt Merle, MD as Consulting Physician (Hematology) Kyung Rudd, MD as Consulting Physician (Radiation Oncology) Mauro Kaufmann, RN as Oncology Nurse Navigator Rockwell Germany, RN as Oncology Nurse Navigator  Date of Service:  07/29/2020  CHIEF COMPLAINT: F/u of metastatic right breast cancer  SUMMARY OF ONCOLOGIC HISTORY: Oncology History Overview Note  Cancer Staging Malignant neoplasm of upper-outer quadrant of right breast in female, estrogen receptor positive ([place]) Staging form: Breast, AJCC 8th Edition - Clinical stage from 11/25/2019: Stage IIA (cT3, cN1, cM0, G2, ER+, PR+, HER2+) - Signed by Truitt Merle, MD on 12/03/2019    Malignant neoplasm of upper-outer quadrant of right breast in female, estrogen receptor positive ([place])  11/13/2019 Mammogram    Diagnostic Mammogram 11/13/19  IMPRESSION: -Highly suspicious mass and calcifications in the right breast centered between 9 and 12 o'clock spanning up to 11 cm.  -Multiple masses in the right axilla. One of the masses contains calcifications, denoted as mass number 2 measuring 9 x 11 by 10 mm. Several of these masses do not appear to represent definitive lymph nodes. Others may be small but abnormal appearing lymph nodes. Taken in total, a cluster of masses in the right axilla spans up to 3.7 cm.  -There are fibrocystic changes on the left. There are also some solid-appearing masses. A solid appearing mass at 10:30, 6 cm from the nipple measures 9 x 6 x 6 mm. An adjacent solid mass at 10 o'clock, 6 cm from the nipple measures 8 x 6 by 5 mm. Another solid-appearing mass at 11 o'clock, 1 cm from the nipple measures 5 x 4 by 5 mm. Fibrocystic changes are seen at 10 o'clock, 12 o'clock, and 4  o'clock. Another solid-appearing mass seen at 5 o'clock, 4 cm from the nipple measuring 6 x 3 x 4 mm. There is a single abnormal node in the left axilla with a cortex measuring 5 mm and restriction of the fatty hilum.   11/25/2019 Initial Biopsy   Diagnosis 1. Breast, right, needle core biopsy, upper outer - INVASIVE MAMMARY CARCINOMA - MAMMARY CARCINOMA IN SITU WITH CALCIFICATIONS AND NECROSIS - SEE COMMENT 2. Lymph node, needle/core biopsy, right axilla - INVASIVE MAMMARY CARCINOMA - SEE COMMENT 3. Lymph node, needle/core biopsy, left axilla - BENIGN LYMPH NODE - NO CARCINOMA IDENTIFIED Microscopic Comment 1. The biopsy material shows an infiltrative proliferation of cells arranged linearly and in small clusters. Based on the biopsy, the carcinoma appears Nottingham grade 2 of 3 and measures 1.2 cm in greatest linear extent. E-cadherin and prognostic markers (ER/PR/ki-67/HER2)are pending and will be reported in an addendum. Dr. Tresa Moore reviewed the case and agrees with the above diagnosis. These results were called to [facility] on November 26, 2019. 2. Definitive nodal tissue is not identified. This biopsy has a slightly different architecture morphology than what is seen in part 1. The carcinoma measures 0.7 cm in greatest dimension. E-cadherin and prognostic markers (ER, PR, Ki-67 and HER-2) are pending and will be reported in an addendum.   11/25/2019 Receptors her2   1. PROGNOSTIC INDICATORS Results: IMMUNOHISTOCHEMICAL AND MORPHOMETRIC ANALYSIS PERFORMED MANUALLY The tumor cells are POSITIVE for Her2 (3+). Estrogen Receptor: 90%, POSITIVE, STRONG STAINING INTENSITY Progesterone Receptor: 20%, POSITIVE, STRONG STAINING INTENSITY Proliferation Marker Ki67: 30%  2. PROGNOSTIC INDICATORS Results: IMMUNOHISTOCHEMICAL AND MORPHOMETRIC ANALYSIS PERFORMED MANUALLY The tumor cells are POSITIVE for Her2 (3+). Estrogen Receptor: 90%, POSITIVE, STRONG STAINING  INTENSITY Progesterone Receptor: 25%, POSITIVE, STRONG STAINING INTENSITY Proliferation Marker Ki67: 30%   11/25/2019 Cancer Staging   Staging form: Breast, AJCC 8th Edition - Clinical stage from 11/25/2019: Stage IIA (cT3, cN1, cM0, G2, ER+, PR+, HER2+) - Signed by Truitt Merle, MD on 12/03/2019   12/01/2019 Initial Diagnosis   Malignant neoplasm of upper-outer quadrant of right breast in female, estrogen receptor positive ([place])   12/10/2019 Imaging   Bone Scan  IMPRESSION: Findings concerning for bony metastatic disease in the left acetabulum region, L3 vertebral body region, and anterolateral right tenth rib.   Increased uptake in several large joints likely is of arthropathic etiology.   12/11/2019 Imaging   CT CAP w contrast  IMPRESSION: Prominent glandular tissue in the central right breast with nipple retraction, likely corresponding to the patient's known right breast neoplasm.   Prominent right axillary nodes measuring up to 8 mm short axis, suspicious for nodal metastases in this clinical context.   3 mm subpleural pulmonary nodule in the right lower lobe, likely benign. However, follow-up CT chest is suggested in 3-6 months.   Multiple hepatic lesions, including a dominant 2.4 cm lesion in segment 6, which is considered indeterminate. Metastasis is not excluded. Consider MRI abdomen with/without contrast for further characterization.   11 mm sclerotic lesion along the left posterior aspect of the T7 vertebral body raises concern for isolated osseous metastasis. Correlate with priors if available. Benign vertebral hemangioma at T11.   12/12/2019 Breast MRI   IMPRESSION: 1. The biopsy proven malignancy in the right breast involves all 4 quadrants and spans up to 9 cm by MRI. There is enhancement within the skin at the level of the nipple.   2. Multiple matted masses are seen in the right axilla, either abnormal metastatic lymph nodes or a combination of masses  and metastatic lymph nodes. One of these has been previously biopsied showing invasive mammary carcinoma without definitive nodal tissue.   3.  There is a 1.5 cm Rotter's lymph node on the right.   4. There is markedly abnormal enhancement and edema in the right pectoralis major muscle spanning 6-7 cm, suspicious for metastatic disease.   5. There is diffuse nodular enhancement throughout the left breast, which may correspond with the solid-appearing masses seen on ultrasound. These all have a similar appearance on MRI.   6.  No findings of left axillary lymphadenopathy.   12/15/2019 Procedure   PAC placed    12/24/2019 Imaging   MRI abdomen  IMPRESSION: 1. Multifocal enhancing bone lesions are noted within the lumbar spine, and bony pelvis compatible with metastatic disease. 2. Four, small peripherally enhancing cystic lesions are noted within the liver worrisome for metastatic disease. 3. 2 enhancing liver lesions are also noted with signal and enhancement characteristics consistent with benign liver hemangioma. 4. Well-circumscribed, enhancing T2 and T1 hypointense structure within the right adnexal region is indeterminate. It is unclear whether not this is arising from the right ovary, broad ligament or right side of uterine fundus. Differential considerations include fibrothecoma, versus pedunculated uterine leiomyoma. Metastatic disease is considered less favored. Attention on follow-up imaging is recommended.     01/03/2020 - 05/27/2020 Chemotherapy   First line THP q3weeks starting 01/03/20-05/27/20   01/05/2020 Imaging   US Abdomen  IMPRESSION: 1. Suspect hematoma in the medial right lobe of the liver in the  area of previous biopsy. Note that recent MR does not show lesion in this area. This area has ill-defined margins and measures 3.9 x 4.0 x 3.5 cm. No perihepatic fluid.   2. Probable hemangiomas elsewhere in the right lobe of the liver. Note that several smaller  lesions seen on recent MR are not appreciable by ultrasound.   3.  Study otherwise unremarkable.   01/12/2020 Pathology Results   FINAL MICROSCOPIC DIAGNOSIS:   A. BONE, SCLEROTIC LESION, LEFT ANTERIOR ILIAC SPINE, BIOPSY:  - Metastatic carcinoma, consistent with patient's clinical history of  primary breast carcinoma  - See comment   COMMENT:  Immunohistochemical stains show that the tumor cells are positive for  CK7 and GATA3; and negative for CK20, consistent with above  interpretation.   PROGNOSTIC INDICATOR RESULTS:  The tumor cells are NEGATIVE for Her2 (0); see comment  Estrogen Receptor: NEGATIVE; see comment  Progesterone Receptor: NEGATIVE; see comment    03/23/2020 Imaging   CT CAP IMPRESSION: 1. Right lower lobe perifissural nodule is slightly more conspicuous on today's study. Close attention on follow-up recommended. 2. Interval progression of segment IV liver lesion, concerning for metastatic progression. The remaining visualized liver lesions are stable to smaller in the interval. 3. Interval evolution in appearance of thoracolumbar spinal lesions and left iliac crest lesion, potentially reflecting response to therapy/healing. No definite new osseous lesions on today's study. 4. New small fluid collection right cul-de-sac. 5. Probable fibroid change in the uterus including exophytic right fundal lesion. 6. Aortic Atherosclerosis (ICD10-I70.0).   03/31/2020 Imaging   MR ABD IMPRESSION: 1. Enlarging hepatic lesion in hepatic subsegment IV suspicious for hepatic metastasis. The it is uncertain whether the change in the short interval is due to technical factors with different modalities or true enlargement. 2. Lesion in the posterior RIGHT hepatic lobe with imaging features that are atypical for hemangioma but stability and signs of enhancement on later phase raising this question. Another more classic appearing may angioma seen inferior to this location  in hepatic subsegment VI. Attention on follow-up. 3. Heterogeneous enhancement throughout the spine particularly at L3 as exhibited on the prior study with very patchy marrow signal remains suspicious for bony metastatic lesions in this patient with known bone metastases.   04/01/2020 Imaging   Bone scan IMPRESSION: Good response to therapy with resolution of sites of uptake seen previously at the cervical spine and posterior RIGHT tenth rib as well as a diminished degree of uptake at remaining previously identified osseous metastatic foci.   04/28/2020 PET scan   IMPRESSION: 1. No hypermetabolic lesions to suggest active metastatic disease involving the liver or osseous structures. 2. Stable small low-attenuation liver lesions, likely treated disease.   06/18/2020 -  Chemotherapy   Given her excellent response, I switched to maintenance therapy with Herceptin, Perjeta (Phesgo) q3weeks and Letrozole.   06/18/2020 -  Chemotherapy   Zometa starting 06/18/20   06/18/2020 -  Anti-estrogen oral therapy   Letrozole 2.53m once dialy.       CURRENT THERAPY:  Maintenance Herceptin and Perjeta (Phesgo) q3weeks starting 06/18/20  Letrozole 2.572mdaily starting 06/19/23 Zometa starting 06/18/20   INTERVAL HISTORY:  KiZurri Stewart here for a follow up. She was last seen by me on 06/18/20. She presents to the clinic alone. She reports she has more energy and has been able to get more exercise because of this. She has been feeling good. She denies any pain. She notes more frequent hot flashes that are tolerable. She  has been eating better, as well. She would like to be around 160 lbs. She is working but not full time, which she notes is working out well for her. Because of this, she is unsure about when to proceed with surgery.   REVIEW OF SYSTEMS:   Constitutional: Denies fevers, chills or abnormal weight loss Eyes: Denies blurriness of vision Ears, nose, mouth, throat, and face: Denies  mucositis or sore throat Respiratory: Denies cough, dyspnea or wheezes Cardiovascular: Denies palpitation, chest discomfort or lower extremity swelling Gastrointestinal:  Denies nausea, heartburn or change in bowel habits Skin: Denies abnormal skin rashes Lymphatics: Denies new lymphadenopathy or easy bruising Neurological:Denies numbness, tingling or new weaknesses Behavioral/Psych: Mood is stable, no new changes  All other systems were reviewed with the patient and are negative.  MEDICAL HISTORY:  Past Medical History:  Diagnosis Date  . Cancer ([place]) 2021  . Hypertension     SURGICAL HISTORY: Past Surgical History:  Procedure Laterality Date  . left parotid tumor removal   2006  . PORTACATH PLACEMENT Left 12/15/2019   Procedure: INSERTION PORT-A-CATH WITH ULTRASOUND GUIDANCE;  Surgeon: [name], MD;  Location: [facility];  Service: General;  Laterality: Left;  . TONSILLECTOMY      I have reviewed the social history and family history with the patient and they are unchanged from previous note.  ALLERGIES:  has No Known Allergies.  MEDICATIONS:  Current Outpatient Medications  Medication Sig Dispense Refill  . acetaminophen (TYLENOL) 500 MG tablet Take 1,000 mg by mouth every 6 (six) hours as needed for moderate pain or headache.    Marland Kitchen amLODipine (NORVASC) 10 MG tablet Take 10 mg by mouth daily.    . Cholecalciferol (VITAMIN D3) 50 MCG (2000 UT) TABS Take 2,000 Units by mouth daily.     Marland Kitchen KLOR-CON M20 20 MEQ tablet TAKE 1 TABLET BY MOUTH EVERY DAY 90 tablet 1  . letrozole (FEMARA) 2.5 MG tablet Take 1 tablet (2.5 mg total) by mouth daily. 30 tablet 3  . metoprolol succinate (TOPROL-XL) 50 MG 24 hr tablet Take 50 mg by mouth daily.    Marland Kitchen omeprazole (PRILOSEC OTC) 20 MG tablet Take 20 mg by mouth daily.    Marland Kitchen oxyCODONE (OXY IR/ROXICODONE) 5 MG immediate release tablet Take 1 tablet (5 mg total) by mouth every 6 (six) hours as needed for severe pain. (Patient not taking: Reported on  12/30/2019) 10 tablet 0   No current facility-administered medications for this visit.    PHYSICAL EXAMINATION: ECOG PERFORMANCE STATUS: 0 - Asymptomatic  Vitals:   07/29/20 0828  BP: 128/74  Pulse: 95  Resp: 19  Temp: 98 F (36.7 C)  SpO2: 97%   Filed Weights   07/29/20 0828  Weight: 186 lb 4.8 oz (84.5 kg)      GENERAL:alert, no distress and comfortable SKIN: skin color, texture, turgor are normal, no rashes or significant lesions EYES: normal, Conjunctiva are pink and non-injected, sclera clear  NECK: supple, thyroid normal size, non-tender, without nodularity LYMPH:  no palpable lymphadenopathy in the cervical, axillary  LUNGS: clear to auscultation and percussion with normal breathing effort HEART: regular rate & rhythm and no murmurs and no lower extremity edema ABDOMEN:abdomen soft, non-tender and normal bowel sounds Musculoskeletal:no cyanosis of digits and no clubbing  NEURO: alert & oriented x 3 with fluent speech, no focal motor/sensory deficits  LABORATORY DATA:  I have reviewed the data as listed CBC Latest Ref Rng & Units 07/29/2020 06/18/2020 05/27/2020  WBC 4.0 -  10.5 K/uL 4.9 6.9 8.3  Hemoglobin 12.0 - 15.0 g/dL 11.9(L) 11.1(L) 11.0(L)  Hematocrit 36.0 - 46.0 % 37.8 35.1(L) 35.5(L)  Platelets 150 - 400 K/uL 278 320 326     CMP Latest Ref Rng & Units 07/29/2020 06/18/2020 05/27/2020  Glucose 70 - 99 mg/dL 107(H) 102(H) 97  BUN 6 - 20 mg/dL '10 10 14  ' Creatinine 0.44 - 1.00 mg/dL 0.75 0.93 0.83  Sodium 135 - 145 mmol/L 144 141 146(H)  Potassium 3.5 - 5.1 mmol/L 3.3(L) 3.7 4.1  Chloride 98 - 111 mmol/L 110 110 112(H)  CO2 22 - 32 mmol/L '23 26 24  ' Calcium 8.9 - 10.3 mg/dL 8.6(L) 9.0 8.9  Total Protein 6.5 - 8.1 g/dL 6.7 5.9(L) 6.0(L)  Total Bilirubin 0.3 - 1.2 mg/dL 0.5 0.3 0.3  Alkaline Phos 38 - 126 U/L 80 73 75  AST 15 - 41 U/L 14(L) 15 15  ALT 0 - 44 U/L 6 <6 8      RADIOGRAPHIC STUDIES: I have personally reviewed the radiological images as listed  and agreed with the findings in the report. No results found.   ASSESSMENT & PLAN:  Carolyn Stewart is a 60 y.o. female with   1.Malignant neoplasm of upper-outer quadrant of right breast,ductal carcinoma,StageIIA, c(T3N1Mx),with bone and possible liver mets,ER+/PR+/HER2+,GradeII-III -She was diagnosed in 11/2019 witha largeright breast mass in 4 quadrants measuringup to 11cm and right lymphadenopathy.Her right breast and LN biopsy was positive for invasiveductalcarcinoma with components of DCIS.Her left axillarynodebiopsy was benign. -9/9/21Liver biopsy was negativefor malignant cellsbut her 01/12/20 Bone Biopsy was positive for metastatic carcinoma from primary breast cancer. Her ER/PR/HER2 were all negative bone biopsy, possibly related to decalcification. -She was also seen to have left breast mass on her 12/12/19 MRI, but given metastatic disease, no furtherbiopsy is needed due to her metastatic disease.  -Wepreviouslydiscussed that she has stage IV metastatic breast cancerwhichis not curable but very treatable.We do not plan toofferright breast surgery in the near future,butmay be an option down the road. -She received THP 01/03/20-05/27/20. Her restaging PET scan from 04/28/20 showed no hypermetabolic lesions. Given her excellent response, I switched to maintenance therapy with Herceptin, Perjeta (Phesgo) q3weeks and Letrozole starting 06/18/20.   -the goal of therapy is disease control, not cure  -Zometa started today 06/18/20, will continue every 3 months.  I encouraged her to keep up with dental care. She has no active dental issues. She is on calcium and VitD -We discussed surgery. I recommend she proceed with surgery at this point while she is on maintenance therapy, as opposed to while on chemo.  -Labs reviewed today, stable. -restaging CT and bone scan for end of May -Infusion in 3 weeks, f/u in 6 weeks  3. HTN -Well controlled on Metoprolol andAmlodipine.  Continue to f/u with PCP  4. Goal of care discussion  -she is full code now    PLAN: -Labs reviewed and adequate to proceed with HP (Phesgo) injection and zometa today  -Continue Letrozole  -lab, flush and HP (Phesgo) in 3 and 6 weeks  -f/u in 6 weeks with restaging CT CAP w contrast and bone scan a few days before    No problem-specific Assessment & Plan notes found for this encounter.   Orders Placed This Encounter  Procedures  . CT CHEST ABDOMEN PELVIS W CONTRAST    Standing Status:   Future    Standing Expiration Date:   07/29/2021    Order Specific Question:   If indicated for the  ordered procedure, I authorize the administration of contrast media per Radiology protocol    Answer:   Yes    Order Specific Question:   Is patient pregnant?    Answer:   No    Order Specific Question:   Preferred imaging location?    Answer:   [facility]    Order Specific Question:   Release to patient    Answer:   Immediate    Order Specific Question:   Is Oral Contrast requested for this exam?    Answer:   Yes, Per Radiology protocol    Order Specific Question:   Reason for Exam (SYMPTOM  OR DIAGNOSIS REQUIRED)    Answer:   evaluate response to chemo  . NM Bone Scan Whole Body    Standing Status:   Future    Standing Expiration Date:   07/29/2021    Order Specific Question:   If indicated for the ordered procedure, I authorize the administration of a radiopharmaceutical per Radiology protocol    Answer:   Yes    Order Specific Question:   Is the patient pregnant?    Answer:   No    Order Specific Question:   Preferred imaging location?    Answer:   [facility]   All questions were answered. The patient knows to call the clinic with any problems, questions or concerns. No barriers to learning was detected. The total time spent in the appointment was 30 minutes.     Truitt Merle, MD 07/29/2020   I, Wilburn Mylar, am acting as scribe for Truitt Merle, MD.   I have  reviewed the above documentation for accuracy and completeness, and I agree with the above.

## 2020-07-29 ENCOUNTER — Ambulatory Visit: Payer: Commercial Managed Care - PPO

## 2020-07-29 ENCOUNTER — Inpatient Hospital Stay: Payer: Commercial Managed Care - PPO

## 2020-07-29 ENCOUNTER — Other Ambulatory Visit: Payer: Self-pay

## 2020-07-29 ENCOUNTER — Other Ambulatory Visit: Payer: Commercial Managed Care - PPO

## 2020-07-29 ENCOUNTER — Encounter: Payer: Self-pay | Admitting: Hematology

## 2020-07-29 ENCOUNTER — Inpatient Hospital Stay: Payer: Commercial Managed Care - PPO | Attending: Hematology | Admitting: Hematology

## 2020-07-29 VITALS — BP 128/74 | HR 95 | Temp 98.0°F | Resp 19 | Ht 67.0 in | Wt 186.3 lb

## 2020-07-29 DIAGNOSIS — Z79811 Long term (current) use of aromatase inhibitors: Secondary | ICD-10-CM | POA: Insufficient documentation

## 2020-07-29 DIAGNOSIS — Z5111 Encounter for antineoplastic chemotherapy: Secondary | ICD-10-CM | POA: Insufficient documentation

## 2020-07-29 DIAGNOSIS — Z9221 Personal history of antineoplastic chemotherapy: Secondary | ICD-10-CM | POA: Diagnosis not present

## 2020-07-29 DIAGNOSIS — Z5112 Encounter for antineoplastic immunotherapy: Secondary | ICD-10-CM | POA: Diagnosis present

## 2020-07-29 DIAGNOSIS — I10 Essential (primary) hypertension: Secondary | ICD-10-CM | POA: Insufficient documentation

## 2020-07-29 DIAGNOSIS — Z79899 Other long term (current) drug therapy: Secondary | ICD-10-CM | POA: Diagnosis not present

## 2020-07-29 DIAGNOSIS — Z170 Estrogen receptor positive status [ER+]: Secondary | ICD-10-CM

## 2020-07-29 DIAGNOSIS — C50411 Malignant neoplasm of upper-outer quadrant of right female breast: Secondary | ICD-10-CM

## 2020-07-29 DIAGNOSIS — N951 Menopausal and female climacteric states: Secondary | ICD-10-CM | POA: Insufficient documentation

## 2020-07-29 LAB — CBC WITH DIFFERENTIAL (CANCER CENTER ONLY)
Abs Immature Granulocytes: 0.01 10*3/uL (ref 0.00–0.07)
Basophils Absolute: 0 10*3/uL (ref 0.0–0.1)
Basophils Relative: 0 %
Eosinophils Absolute: 0.1 10*3/uL (ref 0.0–0.5)
Eosinophils Relative: 3 %
HCT: 37.8 % (ref 36.0–46.0)
Hemoglobin: 11.9 g/dL — ABNORMAL LOW (ref 12.0–15.0)
Immature Granulocytes: 0 %
Lymphocytes Relative: 48 %
Lymphs Abs: 2.3 10*3/uL (ref 0.7–4.0)
MCH: 27.5 pg (ref 26.0–34.0)
MCHC: 31.5 g/dL (ref 30.0–36.0)
MCV: 87.5 fL (ref 80.0–100.0)
Monocytes Absolute: 0.5 10*3/uL (ref 0.1–1.0)
Monocytes Relative: 11 %
Neutro Abs: 1.9 10*3/uL (ref 1.7–7.7)
Neutrophils Relative %: 38 %
Platelet Count: 278 10*3/uL (ref 150–400)
RBC: 4.32 MIL/uL (ref 3.87–5.11)
RDW: 18 % — ABNORMAL HIGH (ref 11.5–15.5)
WBC Count: 4.9 10*3/uL (ref 4.0–10.5)
nRBC: 0 % (ref 0.0–0.2)

## 2020-07-29 LAB — CMP (CANCER CENTER ONLY)
ALT: 6 U/L (ref 0–44)
AST: 14 U/L — ABNORMAL LOW (ref 15–41)
Albumin: 3.8 g/dL (ref 3.5–5.0)
Alkaline Phosphatase: 80 U/L (ref 38–126)
Anion gap: 11 (ref 5–15)
BUN: 10 mg/dL (ref 6–20)
CO2: 23 mmol/L (ref 22–32)
Calcium: 8.6 mg/dL — ABNORMAL LOW (ref 8.9–10.3)
Chloride: 110 mmol/L (ref 98–111)
Creatinine: 0.75 mg/dL (ref 0.44–1.00)
GFR, Estimated: >60 mL/min (ref >60)
Glucose, Bld: 107 mg/dL — ABNORMAL HIGH (ref 70–99)
Potassium: 3.3 mmol/L — ABNORMAL LOW (ref 3.5–5.1)
Sodium: 144 mmol/L (ref 135–145)
Total Bilirubin: 0.5 mg/dL (ref 0.3–1.2)
Total Protein: 6.7 g/dL (ref 6.5–8.1)

## 2020-07-29 MED ORDER — ACETAMINOPHEN 325 MG PO TABS
650.0000 mg | ORAL_TABLET | Freq: Once | ORAL | Status: AC
  Administered 2020-07-29: 650 mg via ORAL

## 2020-07-29 MED ORDER — ACETAMINOPHEN 325 MG PO TABS
ORAL_TABLET | ORAL | Status: AC
  Filled 2020-07-29: qty 2

## 2020-07-29 MED ORDER — DIPHENHYDRAMINE HCL 25 MG PO CAPS
ORAL_CAPSULE | ORAL | Status: AC
  Filled 2020-07-29: qty 2

## 2020-07-29 MED ORDER — DIPHENHYDRAMINE HCL 25 MG PO CAPS
50.0000 mg | ORAL_CAPSULE | Freq: Once | ORAL | Status: AC
  Administered 2020-07-29: 50 mg via ORAL

## 2020-07-29 MED ORDER — PERTUZ-TRASTUZ-HYALURON-ZZXF 60-60-2000 MG-MG-U/ML CHEMO ~~LOC~~ SOLN
10.0000 mL | Freq: Once | SUBCUTANEOUS | Status: AC
  Administered 2020-07-29: 10 mL via SUBCUTANEOUS
  Filled 2020-07-29: qty 10

## 2020-07-29 NOTE — Progress Notes (Signed)
Phesgo injection administered. Pt remained stable during 21minute observation period. No complications or complaints. Pt stable upon exiting clinic.

## 2020-07-29 NOTE — Patient Instructions (Signed)
  [facility] Discharge Instructions for Patients Receiving Chemotherapy  Today you received the following chemotherapy agents Pertuz-Trastuz-Hydroan-zzxf (Phesgo) Injection.  To help prevent nausea and vomiting after your treatment, we encourage you to take your nausea medication as directed.  If you develop nausea and vomiting that is not controlled by your nausea medication, call the clinic.   BELOW ARE SYMPTOMS THAT SHOULD BE REPORTED IMMEDIATELY:  *FEVER GREATER THAN 100.5 F  *CHILLS WITH OR WITHOUT FEVER  NAUSEA AND VOMITING THAT IS NOT CONTROLLED WITH YOUR NAUSEA MEDICATION  *UNUSUAL SHORTNESS OF BREATH  *UNUSUAL BRUISING OR BLEEDING  TENDERNESS IN MOUTH AND THROAT WITH OR WITHOUT PRESENCE OF ULCERS  *URINARY PROBLEMS  *BOWEL PROBLEMS  UNUSUAL RASH Items with * indicate a potential emergency and should be followed up as soon as possible.  Feel free to call the clinic should you have any questions or concerns. The clinic phone number is [phone].  Please show the [facility] at check-in to the Emergency Department and triage nurse.

## 2020-07-30 LAB — CANCER ANTIGEN 27.29: CA 27.29: 27.2 U/mL (ref 0.0–38.6)

## 2020-08-04 ENCOUNTER — Telehealth: Payer: Self-pay | Admitting: Hematology

## 2020-08-04 NOTE — Telephone Encounter (Signed)
Scheduled follow-up appointments per 4/7 los. Patient is aware. 

## 2020-08-19 ENCOUNTER — Other Ambulatory Visit: Payer: Self-pay

## 2020-08-19 ENCOUNTER — Inpatient Hospital Stay: Payer: Commercial Managed Care - PPO

## 2020-08-19 VITALS — BP 155/81 | HR 77 | Temp 98.6°F | Resp 16 | Wt 181.5 lb

## 2020-08-19 DIAGNOSIS — C50411 Malignant neoplasm of upper-outer quadrant of right female breast: Secondary | ICD-10-CM

## 2020-08-19 DIAGNOSIS — Z5112 Encounter for antineoplastic immunotherapy: Secondary | ICD-10-CM | POA: Diagnosis not present

## 2020-08-19 MED ORDER — ACETAMINOPHEN 325 MG PO TABS
650.0000 mg | ORAL_TABLET | Freq: Once | ORAL | Status: AC
  Administered 2020-08-19: 650 mg via ORAL

## 2020-08-19 MED ORDER — PERTUZ-TRASTUZ-HYALURON-ZZXF 60-60-2000 MG-MG-U/ML CHEMO ~~LOC~~ SOLN
10.0000 mL | Freq: Once | SUBCUTANEOUS | Status: AC
  Administered 2020-08-19: 10 mL via SUBCUTANEOUS
  Filled 2020-08-19: qty 10

## 2020-08-19 MED ORDER — ACETAMINOPHEN 325 MG PO TABS
ORAL_TABLET | ORAL | Status: AC
  Filled 2020-08-19: qty 2

## 2020-08-19 MED ORDER — HEPARIN SOD (PORK) LOCK FLUSH 100 UNIT/ML IV SOLN
500.0000 [IU] | Freq: Once | INTRAVENOUS | Status: AC
  Administered 2020-08-19: 500 [IU]
  Filled 2020-08-19: qty 5

## 2020-08-19 MED ORDER — SODIUM CHLORIDE 0.9% FLUSH
10.0000 mL | Freq: Once | INTRAVENOUS | Status: AC
  Administered 2020-08-19: 10 mL
  Filled 2020-08-19: qty 10

## 2020-08-19 MED ORDER — DIPHENHYDRAMINE HCL 25 MG PO CAPS
ORAL_CAPSULE | ORAL | Status: AC
  Filled 2020-08-19: qty 2

## 2020-08-19 MED ORDER — DIPHENHYDRAMINE HCL 25 MG PO CAPS
50.0000 mg | ORAL_CAPSULE | Freq: Once | ORAL | Status: AC
  Administered 2020-08-19: 50 mg via ORAL

## 2020-08-19 NOTE — Progress Notes (Signed)
Per Dr Burr Medico ok to treat with 03/2020 echocardiogram

## 2020-08-19 NOTE — Progress Notes (Signed)
Pt tolerated treatment well but declined to stay for observation after injection. Pt discharged in stable condition, ambulatory to lobby with AVS in hand. Pt verbalized understanding to call if she needs anything.

## 2020-08-19 NOTE — Patient Instructions (Signed)
[facility]  Discharge Instructions: ?Thank you for choosing [facility] to provide your oncology and hematology care.  ? ?If you have a lab appointment with the Cancer Center, please go directly to the Cancer Center and check in at the registration area. ?  ?Wear comfortable clothing and clothing appropriate for easy access to any Portacath or PICC line.  ? ?We strive to give you quality time with your provider. You may need to reschedule your appointment if you arrive late (15 or more minutes).  Arriving late affects you and other patients whose appointments are after yours.  Also, if you miss three or more appointments without notifying the office, you may be dismissed from the clinic at the provider?s discretion.    ?  ?For prescription refill requests, have your pharmacy contact our office and allow 72 hours for refills to be completed.   ? ?Today you received the following chemotherapy and/or immunotherapy agents: Phesgo    ?  ?To help prevent nausea and vomiting after your treatment, we encourage you to take your nausea medication as directed. ? ?BELOW ARE SYMPTOMS THAT SHOULD BE REPORTED IMMEDIATELY: ?*FEVER GREATER THAN 100.4 F (38 ?C) OR HIGHER ?*CHILLS OR SWEATING ?*NAUSEA AND VOMITING THAT IS NOT CONTROLLED WITH YOUR NAUSEA MEDICATION ?*UNUSUAL SHORTNESS OF BREATH ?*UNUSUAL BRUISING OR BLEEDING ?*URINARY PROBLEMS (pain or burning when urinating, or frequent urination) ?*BOWEL PROBLEMS (unusual diarrhea, constipation, pain near the anus) ?TENDERNESS IN MOUTH AND THROAT WITH OR WITHOUT PRESENCE OF ULCERS (sore throat, sores in mouth, or a toothache) ?UNUSUAL RASH, SWELLING OR PAIN  ?UNUSUAL VAGINAL DISCHARGE OR ITCHING  ? ?Items with * indicate a potential emergency and should be followed up as soon as possible or go to the Emergency Department if any problems should occur. ? ?Please show the CHEMOTHERAPY ALERT CARD or IMMUNOTHERAPY ALERT CARD at check-in to the  Emergency Department and triage nurse. ? ?Should you have questions after your visit or need to cancel or reschedule your appointment, please contact [facility]  Dept: [phone]  and follow the prompts.  Office hours are 8:00 a.m. to 4:30 p.m. Monday - Friday. Please note that voicemails left after 4:00 p.m. may not be returned until the following business day.  We are closed weekends and major holidays. You have access to a nurse at all times for urgent questions. Please call the main number to the clinic Dept: [phone] and follow the prompts. ? ? ?For any non-urgent questions, you may also contact your provider using MyChart. We now offer e-Visits for anyone 18 and older to request care online for non-urgent symptoms. For details visit [email or web address]. ?  ?Also download the MyChart app! Go to the app store, search "MyChart", open the app, select [facility], and log in with your MyChart username and password. ? ?Due to Covid, a mask is required upon entering the hospital/clinic. If you do not have a mask, one will be given to you upon arrival. For doctor visits, patients may have 1 support person aged 18 or older with them. For treatment visits, patients cannot have anyone with them due to current Covid guidelines and our immunocompromised population.  ? ?

## 2020-08-25 ENCOUNTER — Telehealth: Payer: Self-pay

## 2020-08-25 NOTE — Telephone Encounter (Signed)
I spoke with Carolyn Stewart and reviewed port flush, bone scan and ct scan appts and instructions.  She verbalized understanding.

## 2020-09-03 ENCOUNTER — Encounter (HOSPITAL_COMMUNITY)
Admission: RE | Admit: 2020-09-03 | Discharge: 2020-09-03 | Disposition: A | Discharge status: Home / Self Care | Payer: Commercial Managed Care - PPO | Source: Ambulatory Visit | Attending: Hematology | Admitting: Hematology

## 2020-09-03 ENCOUNTER — Inpatient Hospital Stay: Payer: Commercial Managed Care - PPO | Attending: Hematology

## 2020-09-03 ENCOUNTER — Ambulatory Visit (HOSPITAL_COMMUNITY)
Admission: RE | Admit: 2020-09-03 | Discharge: 2020-09-03 | Disposition: A | Discharge status: Home / Self Care | Payer: Commercial Managed Care - PPO | Source: Ambulatory Visit | Attending: Hematology | Admitting: Hematology

## 2020-09-03 ENCOUNTER — Other Ambulatory Visit: Payer: Self-pay

## 2020-09-03 DIAGNOSIS — C7951 Secondary malignant neoplasm of bone: Secondary | ICD-10-CM | POA: Insufficient documentation

## 2020-09-03 DIAGNOSIS — C787 Secondary malignant neoplasm of liver and intrahepatic bile duct: Secondary | ICD-10-CM | POA: Diagnosis not present

## 2020-09-03 DIAGNOSIS — I2510 Atherosclerotic heart disease of native coronary artery without angina pectoris: Secondary | ICD-10-CM | POA: Diagnosis not present

## 2020-09-03 DIAGNOSIS — Z5112 Encounter for antineoplastic immunotherapy: Secondary | ICD-10-CM | POA: Insufficient documentation

## 2020-09-03 DIAGNOSIS — Z79811 Long term (current) use of aromatase inhibitors: Secondary | ICD-10-CM | POA: Insufficient documentation

## 2020-09-03 DIAGNOSIS — Z79899 Other long term (current) drug therapy: Secondary | ICD-10-CM | POA: Diagnosis not present

## 2020-09-03 DIAGNOSIS — Z170 Estrogen receptor positive status [ER+]: Secondary | ICD-10-CM

## 2020-09-03 DIAGNOSIS — C50411 Malignant neoplasm of upper-outer quadrant of right female breast: Secondary | ICD-10-CM | POA: Diagnosis not present

## 2020-09-03 DIAGNOSIS — I10 Essential (primary) hypertension: Secondary | ICD-10-CM | POA: Diagnosis not present

## 2020-09-03 LAB — CMP (CANCER CENTER ONLY)
ALT: <6 U/L (ref 0–44)
AST: 14 U/L — ABNORMAL LOW (ref 15–41)
Albumin: 3.8 g/dL (ref 3.5–5.0)
Alkaline Phosphatase: 83 U/L (ref 38–126)
Anion gap: 7 (ref 5–15)
BUN: 13 mg/dL (ref 6–20)
CO2: 29 mmol/L (ref 22–32)
Calcium: 9.7 mg/dL (ref 8.9–10.3)
Chloride: 107 mmol/L (ref 98–111)
Creatinine: 0.77 mg/dL (ref 0.44–1.00)
GFR, Estimated: >60 mL/min (ref >60)
Glucose, Bld: 99 mg/dL (ref 70–99)
Potassium: 3.4 mmol/L — ABNORMAL LOW (ref 3.5–5.1)
Sodium: 143 mmol/L (ref 135–145)
Total Bilirubin: 0.4 mg/dL (ref 0.3–1.2)
Total Protein: 6.8 g/dL (ref 6.5–8.1)

## 2020-09-03 LAB — CBC WITH DIFFERENTIAL (CANCER CENTER ONLY)
Abs Immature Granulocytes: 0.01 10*3/uL (ref 0.00–0.07)
Basophils Absolute: 0 10*3/uL (ref 0.0–0.1)
Basophils Relative: 0 %
Eosinophils Absolute: 0.2 10*3/uL (ref 0.0–0.5)
Eosinophils Relative: 3 %
HCT: 39.2 % (ref 36.0–46.0)
Hemoglobin: 12.6 g/dL (ref 12.0–15.0)
Immature Granulocytes: 0 %
Lymphocytes Relative: 46 %
Lymphs Abs: 2.6 10*3/uL (ref 0.7–4.0)
MCH: 28.3 pg (ref 26.0–34.0)
MCHC: 32.1 g/dL (ref 30.0–36.0)
MCV: 88.1 fL (ref 80.0–100.0)
Monocytes Absolute: 0.7 10*3/uL (ref 0.1–1.0)
Monocytes Relative: 12 %
Neutro Abs: 2.2 10*3/uL (ref 1.7–7.7)
Neutrophils Relative %: 39 %
Platelet Count: 260 10*3/uL (ref 150–400)
RBC: 4.45 MIL/uL (ref 3.87–5.11)
RDW: 16.1 % — ABNORMAL HIGH (ref 11.5–15.5)
WBC Count: 5.6 10*3/uL (ref 4.0–10.5)
nRBC: 0 % (ref 0.0–0.2)

## 2020-09-03 MED ORDER — TECHNETIUM TC 99M MEDRONATE IV KIT
20.8000 | PACK | Freq: Once | INTRAVENOUS | Status: AC
  Administered 2020-09-03: 20.8 via INTRAVENOUS

## 2020-09-03 MED ORDER — HEPARIN SOD (PORK) LOCK FLUSH 100 UNIT/ML IV SOLN
500.0000 [IU] | Freq: Once | INTRAVENOUS | Status: AC
  Administered 2020-09-03: 500 [IU] via INTRAVENOUS

## 2020-09-03 MED ORDER — SODIUM CHLORIDE 0.9% FLUSH
10.0000 mL | Freq: Once | INTRAVENOUS | Status: AC | PRN
  Administered 2020-09-03: 10 mL
  Filled 2020-09-03: qty 10

## 2020-09-03 MED ORDER — IOHEXOL 300 MG/ML  SOLN
75.0000 mL | Freq: Once | INTRAMUSCULAR | Status: AC | PRN
  Administered 2020-09-03: 75 mL via INTRAVENOUS

## 2020-09-03 MED ORDER — SODIUM CHLORIDE (PF) 0.9 % IJ SOLN
INTRAMUSCULAR | Status: AC
  Filled 2020-09-03: qty 50

## 2020-09-04 LAB — CANCER ANTIGEN 27.29: CA 27.29: 34.5 U/mL (ref 0.0–38.6)

## 2020-09-08 ENCOUNTER — Other Ambulatory Visit (HOSPITAL_COMMUNITY): Payer: Commercial Managed Care - PPO

## 2020-09-09 ENCOUNTER — Encounter: Payer: Self-pay | Admitting: Hematology

## 2020-09-09 ENCOUNTER — Other Ambulatory Visit: Payer: Self-pay

## 2020-09-09 ENCOUNTER — Other Ambulatory Visit: Payer: Commercial Managed Care - PPO

## 2020-09-09 ENCOUNTER — Inpatient Hospital Stay: Payer: Commercial Managed Care - PPO

## 2020-09-09 ENCOUNTER — Inpatient Hospital Stay (HOSPITAL_BASED_OUTPATIENT_CLINIC_OR_DEPARTMENT_OTHER): Payer: Commercial Managed Care - PPO | Admitting: Hematology

## 2020-09-09 VITALS — BP 138/86 | HR 85 | Temp 97.6°F | Resp 17 | Ht 67.0 in | Wt 182.1 lb

## 2020-09-09 DIAGNOSIS — I10 Essential (primary) hypertension: Secondary | ICD-10-CM | POA: Diagnosis not present

## 2020-09-09 DIAGNOSIS — Z170 Estrogen receptor positive status [ER+]: Secondary | ICD-10-CM | POA: Diagnosis not present

## 2020-09-09 DIAGNOSIS — C50411 Malignant neoplasm of upper-outer quadrant of right female breast: Secondary | ICD-10-CM

## 2020-09-09 DIAGNOSIS — Z5112 Encounter for antineoplastic immunotherapy: Secondary | ICD-10-CM | POA: Diagnosis not present

## 2020-09-09 MED ORDER — ZOLEDRONIC ACID 4 MG/100ML IV SOLN
INTRAVENOUS | Status: AC
  Filled 2020-09-09: qty 100

## 2020-09-09 MED ORDER — ZOLEDRONIC ACID 4 MG/100ML IV SOLN
4.0000 mg | Freq: Once | INTRAVENOUS | Status: AC
  Administered 2020-09-09: 4 mg via INTRAVENOUS

## 2020-09-09 MED ORDER — PERTUZ-TRASTUZ-HYALURON-ZZXF 60-60-2000 MG-MG-U/ML CHEMO ~~LOC~~ SOLN
10.0000 mL | Freq: Once | SUBCUTANEOUS | Status: AC
  Administered 2020-09-09: 10 mL via SUBCUTANEOUS
  Filled 2020-09-09: qty 10

## 2020-09-09 MED ORDER — LIDOCAINE-PRILOCAINE 2.5-2.5 % EX CREA: 1.0000 "application " | TOPICAL_CREAM | CUTANEOUS | 1 refills | Status: DC | PRN

## 2020-09-09 MED ORDER — ACETAMINOPHEN 325 MG PO TABS
ORAL_TABLET | ORAL | Status: AC
  Filled 2020-09-09: qty 2

## 2020-09-09 MED ORDER — DIPHENHYDRAMINE HCL 25 MG PO CAPS
ORAL_CAPSULE | ORAL | Status: AC
  Filled 2020-09-09: qty 2

## 2020-09-09 MED ORDER — SODIUM CHLORIDE 0.9 % IV SOLN
Freq: Once | INTRAVENOUS | Status: AC
Start: 2020-09-09 — End: 2020-09-09
  Filled 2020-09-09: qty 250

## 2020-09-09 MED ORDER — DIPHENHYDRAMINE HCL 25 MG PO CAPS
50.0000 mg | ORAL_CAPSULE | Freq: Once | ORAL | Status: AC
Start: 2020-09-09 — End: 2020-09-09
  Administered 2020-09-09: 50 mg via ORAL

## 2020-09-09 MED ORDER — ACETAMINOPHEN 325 MG PO TABS
650.0000 mg | ORAL_TABLET | Freq: Once | ORAL | Status: AC
  Administered 2020-09-09: 650 mg via ORAL

## 2020-09-09 NOTE — Patient Instructions (Signed)
[facility]  Discharge Instructions: ?Thank you for choosing [facility] to provide your oncology and hematology care.  ? ?If you have a lab appointment with the Cancer Center, please go directly to the Cancer Center and check in at the registration area. ?  ?Wear comfortable clothing and clothing appropriate for easy access to any Portacath or PICC line.  ? ?We strive to give you quality time with your provider. You may need to reschedule your appointment if you arrive late (15 or more minutes).  Arriving late affects you and other patients whose appointments are after yours.  Also, if you miss three or more appointments without notifying the office, you may be dismissed from the clinic at the provider?s discretion.    ?  ?For prescription refill requests, have your pharmacy contact our office and allow 72 hours for refills to be completed.   ? ?Today you received the following chemotherapy and/or immunotherapy agents: Phesgo    ?  ?To help prevent nausea and vomiting after your treatment, we encourage you to take your nausea medication as directed. ? ?BELOW ARE SYMPTOMS THAT SHOULD BE REPORTED IMMEDIATELY: ?*FEVER GREATER THAN 100.4 F (38 ?C) OR HIGHER ?*CHILLS OR SWEATING ?*NAUSEA AND VOMITING THAT IS NOT CONTROLLED WITH YOUR NAUSEA MEDICATION ?*UNUSUAL SHORTNESS OF BREATH ?*UNUSUAL BRUISING OR BLEEDING ?*URINARY PROBLEMS (pain or burning when urinating, or frequent urination) ?*BOWEL PROBLEMS (unusual diarrhea, constipation, pain near the anus) ?TENDERNESS IN MOUTH AND THROAT WITH OR WITHOUT PRESENCE OF ULCERS (sore throat, sores in mouth, or a toothache) ?UNUSUAL RASH, SWELLING OR PAIN  ?UNUSUAL VAGINAL DISCHARGE OR ITCHING  ? ?Items with * indicate a potential emergency and should be followed up as soon as possible or go to the Emergency Department if any problems should occur. ? ?Please show the CHEMOTHERAPY ALERT CARD or IMMUNOTHERAPY ALERT CARD at check-in to the  Emergency Department and triage nurse. ? ?Should you have questions after your visit or need to cancel or reschedule your appointment, please contact [facility]  Dept: [phone]  and follow the prompts.  Office hours are 8:00 a.m. to 4:30 p.m. Monday - Friday. Please note that voicemails left after 4:00 p.m. may not be returned until the following business day.  We are closed weekends and major holidays. You have access to a nurse at all times for urgent questions. Please call the main number to the clinic Dept: [phone] and follow the prompts. ? ? ?For any non-urgent questions, you may also contact your provider using MyChart. We now offer e-Visits for anyone 18 and older to request care online for non-urgent symptoms. For details visit [email or web address]. ?  ?Also download the MyChart app! Go to the app store, search "MyChart", open the app, select , and log in with your MyChart username and password. ? ?Due to Covid, a mask is required upon entering the hospital/clinic. If you do not have a mask, one will be given to you upon arrival. For doctor visits, patients may have 1 support person aged 18 or older with them. For treatment visits, patients cannot have anyone with them due to current Covid guidelines and our immunocompromised population.  ? ?

## 2020-09-09 NOTE — Progress Notes (Signed)
[place]   Telephone:(336) [phone] Fax:(336) [phone]   Clinic Follow up Note   Patient Care Team: Julian Hy, PA-C as PCP - General (Physician Assistant) [name], MD as Consulting Physician (General Surgery) Truitt Merle, MD as Consulting Physician (Hematology) Kyung Rudd, MD as Consulting Physician (Radiation Oncology) Mauro Kaufmann, RN as Oncology Nurse Navigator Rockwell Germany, RN as Oncology Nurse Navigator  Date of Service:  09/09/2020  CHIEF COMPLAINT: f/u of metastatic right breast cancer  SUMMARY OF ONCOLOGIC HISTORY: Oncology History Overview Note  Cancer Staging Malignant neoplasm of upper-outer quadrant of right breast in female, estrogen receptor positive ([place]) Staging form: Breast, AJCC 8th Edition - Clinical stage from 11/25/2019: Stage IIA (cT3, cN1, cM0, G2, ER+, PR+, HER2+) - Signed by Truitt Merle, MD on 12/03/2019    Malignant neoplasm of upper-outer quadrant of right breast in female, estrogen receptor positive ([place])  11/13/2019 Mammogram    Diagnostic Mammogram 11/13/19  IMPRESSION: -Highly suspicious mass and calcifications in the right breast centered between 9 and 12 o'clock spanning up to 11 cm.  -Multiple masses in the right axilla. One of the masses contains calcifications, denoted as mass number 2 measuring 9 x 11 by 10 mm. Several of these masses do not appear to represent definitive lymph nodes. Others may be small but abnormal appearing lymph nodes. Taken in total, a cluster of masses in the right axilla spans up to 3.7 cm.  -There are fibrocystic changes on the left. There are also some solid-appearing masses. A solid appearing mass at 10:30, 6 cm from the nipple measures 9 x 6 x 6 mm. An adjacent solid mass at 10 o'clock, 6 cm from the nipple measures 8 x 6 by 5 mm. Another solid-appearing mass at 11 o'clock, 1 cm from the nipple measures 5 x 4 by 5 mm. Fibrocystic changes are seen at 10 o'clock, 12 o'clock, and 4  o'clock. Another solid-appearing mass seen at 5 o'clock, 4 cm from the nipple measuring 6 x 3 x 4 mm. There is a single abnormal node in the left axilla with a cortex measuring 5 mm and restriction of the fatty hilum.   11/25/2019 Initial Biopsy   Diagnosis 1. Breast, right, needle core biopsy, upper outer - INVASIVE MAMMARY CARCINOMA - MAMMARY CARCINOMA IN SITU WITH CALCIFICATIONS AND NECROSIS - SEE COMMENT 2. Lymph node, needle/core biopsy, right axilla - INVASIVE MAMMARY CARCINOMA - SEE COMMENT 3. Lymph node, needle/core biopsy, left axilla - BENIGN LYMPH NODE - NO CARCINOMA IDENTIFIED Microscopic Comment 1. The biopsy material shows an infiltrative proliferation of cells arranged linearly and in small clusters. Based on the biopsy, the carcinoma appears Nottingham grade 2 of 3 and measures 1.2 cm in greatest linear extent. E-cadherin and prognostic markers (ER/PR/ki-67/HER2)are pending and will be reported in an addendum. Dr. Tresa Moore reviewed the case and agrees with the above diagnosis. These results were called to [facility] on November 26, 2019. 2. Definitive nodal tissue is not identified. This biopsy has a slightly different architecture morphology than what is seen in part 1. The carcinoma measures 0.7 cm in greatest dimension. E-cadherin and prognostic markers (ER, PR, Ki-67 and HER-2) are pending and will be reported in an addendum.   11/25/2019 Receptors her2   1. PROGNOSTIC INDICATORS Results: IMMUNOHISTOCHEMICAL AND MORPHOMETRIC ANALYSIS PERFORMED MANUALLY The tumor cells are POSITIVE for Her2 (3+). Estrogen Receptor: 90%, POSITIVE, STRONG STAINING INTENSITY Progesterone Receptor: 20%, POSITIVE, STRONG STAINING INTENSITY Proliferation Marker Ki67: 30%  2. PROGNOSTIC INDICATORS Results: IMMUNOHISTOCHEMICAL AND MORPHOMETRIC ANALYSIS PERFORMED MANUALLY The tumor cells are POSITIVE for Her2 (3+). Estrogen Receptor: 90%, POSITIVE, STRONG STAINING  INTENSITY Progesterone Receptor: 25%, POSITIVE, STRONG STAINING INTENSITY Proliferation Marker Ki67: 30%   11/25/2019 Cancer Staging   Staging form: Breast, AJCC 8th Edition - Clinical stage from 11/25/2019: Stage IIA (cT3, cN1, cM0, G2, ER+, PR+, HER2+) - Signed by Truitt Merle, MD on 12/03/2019   12/01/2019 Initial Diagnosis   Malignant neoplasm of upper-outer quadrant of right breast in female, estrogen receptor positive ([place])   12/10/2019 Imaging   Bone Scan  IMPRESSION: Findings concerning for bony metastatic disease in the left acetabulum region, L3 vertebral body region, and anterolateral right tenth rib.   Increased uptake in several large joints likely is of arthropathic etiology.   12/11/2019 Imaging   CT CAP w contrast  IMPRESSION: Prominent glandular tissue in the central right breast with nipple retraction, likely corresponding to the patient's known right breast neoplasm.   Prominent right axillary nodes measuring up to 8 mm short axis, suspicious for nodal metastases in this clinical context.   3 mm subpleural pulmonary nodule in the right lower lobe, likely benign. However, follow-up CT chest is suggested in 3-6 months.   Multiple hepatic lesions, including a dominant 2.4 cm lesion in segment 6, which is considered indeterminate. Metastasis is not excluded. Consider MRI abdomen with/without contrast for further characterization.   11 mm sclerotic lesion along the left posterior aspect of the T7 vertebral body raises concern for isolated osseous metastasis. Correlate with priors if available. Benign vertebral hemangioma at T11.   12/12/2019 Breast MRI   IMPRESSION: 1. The biopsy proven malignancy in the right breast involves all 4 quadrants and spans up to 9 cm by MRI. There is enhancement within the skin at the level of the nipple.   2. Multiple matted masses are seen in the right axilla, either abnormal metastatic lymph nodes or a combination of masses  and metastatic lymph nodes. One of these has been previously biopsied showing invasive mammary carcinoma without definitive nodal tissue.   3.  There is a 1.5 cm Rotter's lymph node on the right.   4. There is markedly abnormal enhancement and edema in the right pectoralis major muscle spanning 6-7 cm, suspicious for metastatic disease.   5. There is diffuse nodular enhancement throughout the left breast, which may correspond with the solid-appearing masses seen on ultrasound. These all have a similar appearance on MRI.   6.  No findings of left axillary lymphadenopathy.   12/15/2019 Procedure   PAC placed    12/24/2019 Imaging   MRI abdomen  IMPRESSION: 1. Multifocal enhancing bone lesions are noted within the lumbar spine, and bony pelvis compatible with metastatic disease. 2. Four, small peripherally enhancing cystic lesions are noted within the liver worrisome for metastatic disease. 3. 2 enhancing liver lesions are also noted with signal and enhancement characteristics consistent with benign liver hemangioma. 4. Well-circumscribed, enhancing T2 and T1 hypointense structure within the right adnexal region is indeterminate. It is unclear whether not this is arising from the right ovary, broad ligament or right side of uterine fundus. Differential considerations include fibrothecoma, versus pedunculated uterine leiomyoma. Metastatic disease is considered less favored. Attention on follow-up imaging is recommended.     01/03/2020 - 05/27/2020 Chemotherapy   First line THP q3weeks starting 01/03/20-05/27/20   01/05/2020 Imaging   US Abdomen  IMPRESSION: 1. Suspect hematoma in the medial right lobe of the liver in the  area of previous biopsy. Note that recent MR does not show lesion in this area. This area has ill-defined margins and measures 3.9 x 4.0 x 3.5 cm. No perihepatic fluid.   2. Probable hemangiomas elsewhere in the right lobe of the liver. Note that several smaller  lesions seen on recent MR are not appreciable by ultrasound.   3.  Study otherwise unremarkable.   01/12/2020 Pathology Results   FINAL MICROSCOPIC DIAGNOSIS:   A. BONE, SCLEROTIC LESION, LEFT ANTERIOR ILIAC SPINE, BIOPSY:  - Metastatic carcinoma, consistent with patient's clinical history of  primary breast carcinoma  - See comment   COMMENT:  Immunohistochemical stains show that the tumor cells are positive for  CK7 and GATA3; and negative for CK20, consistent with above  interpretation.   PROGNOSTIC INDICATOR RESULTS:  The tumor cells are NEGATIVE for Her2 (0); see comment  Estrogen Receptor: NEGATIVE; see comment  Progesterone Receptor: NEGATIVE; see comment    03/23/2020 Imaging   CT CAP IMPRESSION: 1. Right lower lobe perifissural nodule is slightly more conspicuous on today's study. Close attention on follow-up recommended. 2. Interval progression of segment IV liver lesion, concerning for metastatic progression. The remaining visualized liver lesions are stable to smaller in the interval. 3. Interval evolution in appearance of thoracolumbar spinal lesions and left iliac crest lesion, potentially reflecting response to therapy/healing. No definite new osseous lesions on today's study. 4. New small fluid collection right cul-de-sac. 5. Probable fibroid change in the uterus including exophytic right fundal lesion. 6. Aortic Atherosclerosis (ICD10-I70.0).   03/31/2020 Imaging   MR ABD IMPRESSION: 1. Enlarging hepatic lesion in hepatic subsegment IV suspicious for hepatic metastasis. The it is uncertain whether the change in the short interval is due to technical factors with different modalities or true enlargement. 2. Lesion in the posterior RIGHT hepatic lobe with imaging features that are atypical for hemangioma but stability and signs of enhancement on later phase raising this question. Another more classic appearing may angioma seen inferior to this location  in hepatic subsegment VI. Attention on follow-up. 3. Heterogeneous enhancement throughout the spine particularly at L3 as exhibited on the prior study with very patchy marrow signal remains suspicious for bony metastatic lesions in this patient with known bone metastases.   04/01/2020 Imaging   Bone scan IMPRESSION: Good response to therapy with resolution of sites of uptake seen previously at the cervical spine and posterior RIGHT tenth rib as well as a diminished degree of uptake at remaining previously identified osseous metastatic foci.   04/28/2020 PET scan   IMPRESSION: 1. No hypermetabolic lesions to suggest active metastatic disease involving the liver or osseous structures. 2. Stable small low-attenuation liver lesions, likely treated disease.   06/18/2020 -  Chemotherapy   Given her excellent response, I switched to maintenance therapy with Herceptin, Perjeta (Phesgo) q3weeks and Letrozole.   06/18/2020 -  Chemotherapy   Zometa starting 06/18/20   06/18/2020 -  Anti-estrogen oral therapy   Letrozole 2.58m once dialy.    09/03/2020 Imaging   Bone Scan IMPRESSION: 1. Significant interval improvement compared to previous studies. Only mild uptake remains in the pelvis as above. There has been significant interval improvement over time.  CT C/A/P IMPRESSION: 1. Hypodense liver lesions are slightly decreased in size compared to prior examination, consistent with treatment response of hepatic metastatic disease. 2. Unchanged sclerotic lesion of the anterior left iliac crest. Unchanged lytic superior endplate deformity of L3, which remains equivocal for metastatic lesion versus mechanical Schmorl deformity. No CT  evidence of new osseous metastatic disease. 3. No evidence of new metastatic disease in the chest, abdomen, or pelvis. 4. Coronary artery disease.      CURRENT THERAPY:  Maintenance Herceptin and Perjeta (Phesgo) q3weeks starting 06/18/20  Letrozole 2.49m  daily starting 06/19/23 Zometa starting 06/18/20  INTERVAL HISTORY:  KDarnette Lampronis here for a follow up of metastatic right breast cancer. She was last seen by me on 07/29/20. She presents to the clinic alone. She notes she can't afford to proceed with surgery due to having to miss work for the procedure. She denies any issues from her treatment. She denies rash, diarrhea from infusion. She notes some hot flashes from the letrozole, but these are tolerable. She notes some joint stiffness in her knees and her left hip, but this is manageable with tylenol/ibuprofen. She notes her hair and nails are growing back. She notes some continued, intermittent tingling in her feet, but this is much improved from prior.   All other systems were reviewed with the patient and are negative.  MEDICAL HISTORY:  Past Medical History:  Diagnosis Date  . [place]  . Hypertension     SURGICAL HISTORY: Past Surgical History:  Procedure Laterality Date  . left parotid tumor removal   2006  . PORTACATH PLACEMENT Left 12/15/2019   Procedure: INSERTION PORT-A-CATH WITH ULTRASOUND GUIDANCE;  Surgeon: BStark Klein MD;  [facility]  Service: General;  Laterality: Left;  . TONSILLECTOMY      I have reviewed the social history and family history with the patient and they are unchanged from previous note.  ALLERGIES:  has No Known Allergies.  MEDICATIONS:  Current Outpatient Medications  Medication Sig Dispense Refill  . lidocaine-prilocaine (EMLA) cream Apply 1 application topically as needed. 30 g 1  . acetaminophen (TYLENOL) 500 MG tablet Take 1,000 mg by mouth every 6 (six) hours as needed for moderate pain or headache.    .Marland KitchenamLODipine (NORVASC) 10 MG tablet Take 10 mg by mouth daily.    . Cholecalciferol (VITAMIN D3) 50 MCG (2000 UT) TABS Take 2,000 Units by mouth daily.     .Marland KitchenKLOR-CON M20 20 MEQ tablet TAKE 1 TABLET BY MOUTH EVERY DAY 90 tablet 1  . letrozole (FEMARA) 2.5 MG tablet Take 1  tablet (2.5 mg total) by mouth daily. 30 tablet 3  . metoprolol succinate (TOPROL-XL) 50 MG 24 hr tablet Take 50 mg by mouth daily.    .Marland Kitchenomeprazole (PRILOSEC OTC) 20 MG tablet Take 20 mg by mouth daily.     No current facility-administered medications for this visit.   Facility-Administered Medications Ordered in Other Visits  Medication Dose Route Frequency Provider Last Rate Last Admin  . pertuz-trastuz-hyaluron-zzxf (PHESGO) 600-600-20000 MG-MG-U/10ML chemo SQ injection maintenance dose 10 mL  10 mL Subcutaneous Once FTruitt Merle MD        PHYSICAL EXAMINATION: ECOG PERFORMANCE STATUS: 1 - Symptomatic but completely ambulatory  Vitals:   09/09/20 0844  BP: 138/86  Pulse: 85  Resp: 17  Temp: 97.6 F (36.4 C)  SpO2: 99%   Filed Weights   09/09/20 0844  Weight: 182 lb 1.6 oz (82.6 kg)    Due to COVID19 we will limit examination to appearance. Patient had no complaints.  GENERAL:alert, no distress and comfortable SKIN: skin color normal, no rashes or significant lesions EYES: normal, Conjunctiva are pink and non-injected, sclera clear  NEURO: alert & oriented x 3 with fluent speech  LABORATORY DATA:  I have reviewed  the data as listed CBC Latest Ref Rng & Units 09/03/2020 07/29/2020 06/18/2020  WBC 4.0 - 10.5 K/uL 5.6 4.9 6.9  Hemoglobin 12.0 - 15.0 g/dL 12.6 11.9(L) 11.1(L)  Hematocrit 36.0 - 46.0 % 39.2 37.8 35.1(L)  Platelets 150 - 400 K/uL 260 278 320     CMP Latest Ref Rng & Units 09/03/2020 07/29/2020 06/18/2020  Glucose 70 - 99 mg/dL 99 107(H) 102(H)  BUN 6 - 20 mg/dL '13 10 10  ' Creatinine 0.44 - 1.00 mg/dL 0.77 0.75 0.93  Sodium 135 - 145 mmol/L 143 144 141  Potassium 3.5 - 5.1 mmol/L 3.4(L) 3.3(L) 3.7  Chloride 98 - 111 mmol/L 107 110 110  CO2 22 - 32 mmol/L '29 23 26  ' Calcium 8.9 - 10.3 mg/dL 9.7 8.6(L) 9.0  Total Protein 6.5 - 8.1 g/dL 6.8 6.7 5.9(L)  Total Bilirubin 0.3 - 1.2 mg/dL 0.4 0.5 0.3  Alkaline Phos 38 - 126 U/L 83 80 73  AST 15 - 41 U/L 14(L) 14(L) 15   ALT 0 - 44 U/L <6 6 <6      RADIOGRAPHIC STUDIES: I have personally reviewed the radiological images as listed and agreed with the findings in the report. No results found.   ASSESSMENT & PLAN:  Carolyn Stewart is a 60 y.o. female with   1.Malignant neoplasm of upper-outer quadrant of right breast,ductal carcinoma,StageIIA, c(T3N1Mx),with bone and possible liver mets,ER+/PR+/HER2+,GradeII-III -She was diagnosed in 11/2019 witha largeright breast mass in 4 quadrants measuringup to 11cm and right lymphadenopathy.Her right breast and LN biopsy was positive for invasiveductalcarcinoma with components of DCIS.Her left axillarynodebiopsy was benign. -9/9/21Liver biopsy was negativefor malignant cellsbut her 01/12/20 Bone Biopsy was positive for metastatic carcinoma from primary breast cancer. Her ER/PR/HER2 were all negative bone biopsy, possibly related to decalcification. -She was also seen to have left breast mass on her 12/12/19 MRI, but given metastatic disease, no furtherbiopsy is needed due to her metastatic disease.  -Wepreviouslydiscussed that she has stage IV metastatic breast cancerwhichis not curable but very treatable.We do not plan toofferright breast surgery in the near future,butmay be an option down the road. -She received THP 01/03/20-05/27/20. Herrestaging PET scan from 04/28/20 showed no hypermetabolic lesions. Given her excellent response, I switched to maintenance therapy with Herceptin, Perjeta (Phesgo) q3weeks and Letrozole starting 06/18/20.   -the goal of therapy is disease control, not cure  -Zometa started 06/18/20, will continue every 3 months.  I encouraged her to keep up with dental care. She has no active dental issues. She is on calcium and VitD -Restaging CT and bone scan on 09/03/20 showed interval treatment response, both to osseous metastases and liver lesions. Will continue current maintenance therapy  -Labs reviewed, potassium is low  today. She endorses taking potassium supplement. I recommended she also include potassium in her diet, such as bananas. -Her tumor marker has remains in the normal range. She is clinically doing very well  -injection every 3 weeks, f/u in 9 weeks -next echo on 09/21/2020  2. HTN -Well controlled on Metoprolol andAmlodipine. Continue to f/u with PCP  3. Goal of care discussion  -she is full code now    PLAN: -Proceed with HP (Phesgo) injection and zometa today. She would like to receive these on Fridays in future due to her changing work schedule. -Continue Letrozole  -I refilled her Emla cream today. -lab, flush in 6 weeks -HP (Phesgo) in 3, 6, and 9 weeks  -f/u in 9 weeks   No problem-specific Assessment & Plan notes  found for this encounter.   No orders of the defined types were placed in this encounter.  All questions were answered. The patient knows to call the clinic with any problems, questions or concerns. No barriers to learning was detected. The total time spent in the appointment was 30 minutes.     Truitt Merle, MD 09/09/2020   I, Wilburn Mylar, am acting as scribe for Truitt Merle, MD.   I have reviewed the above documentation for accuracy and completeness, and I agree with the above.

## 2020-09-12 ENCOUNTER — Other Ambulatory Visit: Payer: Self-pay | Admitting: Hematology

## 2020-09-13 ENCOUNTER — Encounter: Payer: Self-pay | Admitting: Hematology

## 2020-09-21 ENCOUNTER — Other Ambulatory Visit (HOSPITAL_COMMUNITY): Payer: Commercial Managed Care - PPO

## 2020-09-22 ENCOUNTER — Other Ambulatory Visit: Payer: Self-pay

## 2020-09-22 ENCOUNTER — Ambulatory Visit (HOSPITAL_COMMUNITY)
Admission: RE | Admit: 2020-09-22 | Discharge: 2020-09-22 | Disposition: A | Discharge status: Home / Self Care | Payer: Commercial Managed Care - PPO | Source: Ambulatory Visit | Attending: Hematology | Admitting: Hematology

## 2020-09-22 DIAGNOSIS — Z01810 Encounter for preprocedural cardiovascular examination: Secondary | ICD-10-CM | POA: Insufficient documentation

## 2020-09-22 DIAGNOSIS — Z170 Estrogen receptor positive status [ER+]: Secondary | ICD-10-CM | POA: Diagnosis not present

## 2020-09-22 DIAGNOSIS — I119 Hypertensive heart disease without heart failure: Secondary | ICD-10-CM | POA: Insufficient documentation

## 2020-09-22 DIAGNOSIS — C50411 Malignant neoplasm of upper-outer quadrant of right female breast: Secondary | ICD-10-CM

## 2020-09-22 DIAGNOSIS — I351 Nonrheumatic aortic (valve) insufficiency: Secondary | ICD-10-CM | POA: Insufficient documentation

## 2020-09-22 DIAGNOSIS — Z0189 Encounter for other specified special examinations: Secondary | ICD-10-CM

## 2020-09-22 LAB — ECHOCARDIOGRAM COMPLETE
Area-P 1/2: 4.06 cm2
Calc EF: 54.4 %
S' Lateral: 3.4 cm
Single Plane A2C EF: 54.4 %
Single Plane A4C EF: 55 %

## 2020-09-22 NOTE — Progress Notes (Signed)
  Echocardiogram 2D Echocardiogram with strain has been performed.  Darlina Sicilian M 09/22/2020, 11:44 AM

## 2020-09-24 ENCOUNTER — Other Ambulatory Visit: Payer: Self-pay | Admitting: *Deleted

## 2020-09-24 DIAGNOSIS — Z170 Estrogen receptor positive status [ER+]: Secondary | ICD-10-CM

## 2020-09-24 DIAGNOSIS — C50411 Malignant neoplasm of upper-outer quadrant of right female breast: Secondary | ICD-10-CM

## 2020-09-24 NOTE — Progress Notes (Signed)
a 

## 2020-09-27 ENCOUNTER — Telehealth: Payer: Self-pay

## 2020-09-27 NOTE — Telephone Encounter (Signed)
Carolyn Stewart returned my call.  I relayed Dr Ernestina Penna comments and recommendations.  She verbalized understanding.

## 2020-09-27 NOTE — Telephone Encounter (Signed)
I left vm for [facility] requesting a call back.  I have cancelled her appt on 10/01/2020 for Phesgo.

## 2020-09-27 NOTE — Telephone Encounter (Signed)
-----   Message from Rockwell Germany, RN sent at 09/24/2020  2:59 PM EDT ----- Regarding: RE: ECHO results Yes will do.  Varney Biles ----- Message ----- From: Truitt Merle, MD Sent: 09/24/2020   2:41 PM EDT To: Lavinia Sharps, RPH, Jolaine Artist, MD, # Subject: RE: ECHO results                               Dawn and Varney Biles,  Please help me to make a urgent referral of this pt to Dr. Clayborne Dana office, thanks   Santiago Glad, please let her know hte echo result, and cancel her appointment on 6/10 and schedule f/u after her appointment with cardiology  Thanks  Krista Blue  ----- Message ----- From: Lavinia Sharps, [facility] Sent: 09/24/2020   2:01 PM EDT To: Truitt Merle, MD, Royston Bake, RN Subject: ECHO results                                   Hi Dr Burr Medico   09/22/20 echo EF = 45-50% (baseline 60-65%)  Pt scheduled for next Phesgo inj on 10/01/20 and no office visit prior to infusion appt.  Would you like pt to get Phesgo with decrease in EF?  Thanks [employer]

## 2020-09-29 ENCOUNTER — Telehealth: Payer: Self-pay | Admitting: *Deleted

## 2020-09-29 NOTE — Telephone Encounter (Signed)
Spoke with Dr. Letha Cape office regarding urgent referral that was placed 6/3. Per office he is booked until august so they are working on getting her worked into his schedule asap. I was informed they will call the patient with an appointment.

## 2020-10-01 ENCOUNTER — Telehealth (HOSPITAL_COMMUNITY): Payer: Self-pay | Admitting: Vascular Surgery

## 2020-10-01 ENCOUNTER — Inpatient Hospital Stay: Payer: Commercial Managed Care - PPO

## 2020-10-01 NOTE — Telephone Encounter (Signed)
Left pt message giving new brst cancer appt w/ db 6/17 @ 11 am asked pt to call back to confirm appt

## 2020-10-04 ENCOUNTER — Telehealth: Payer: Self-pay | Admitting: Hematology

## 2020-10-04 NOTE — Telephone Encounter (Signed)
Scheduled appointment per 06/13 los. Patient is aware. 

## 2020-10-08 ENCOUNTER — Inpatient Hospital Stay (HOSPITAL_COMMUNITY)
Admission: RE | Admit: 2020-10-08 | Discharge: 2020-10-08 | Disposition: A | Discharge status: Home / Self Care | Payer: Commercial Managed Care - PPO | Source: Ambulatory Visit | Attending: Internal Medicine | Admitting: Internal Medicine

## 2020-10-08 NOTE — Progress Notes (Signed)
Patient no showed for appt.

## 2020-10-14 ENCOUNTER — Encounter: Payer: Self-pay | Admitting: General Practice

## 2020-10-14 NOTE — Progress Notes (Signed)
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  Patient is losing her insurance on October 22, 2020.  She has been covered by her husband's insurance to date, but coverage costs have increased for family coverage and she is not able to afford this.  She works part time and has limited insurance available to her at her employer.  Provided information on application process for Medicaid - she will need to contact [place] to determine if she is eligible.  Gave information on the [employer] - she can call them to determine if she can get affordable coverage through the Marketplace.  Also referred her to T[facility] Cancer, [facility], [place] in [place] for other options for financial assistance.  Discussed option of applying for Social Security Disability which comes w Medicare after 24 months.  She will consider all her options and let us know if she needs further assistance.  Edwyna Shell, LCSW Clinical Social Worker Phone:  [phone]

## 2020-10-21 ENCOUNTER — Encounter (HOSPITAL_COMMUNITY): Payer: Self-pay | Admitting: Internal Medicine

## 2020-10-21 ENCOUNTER — Other Ambulatory Visit: Payer: Self-pay

## 2020-10-21 ENCOUNTER — Ambulatory Visit (HOSPITAL_COMMUNITY)
Admission: RE | Admit: 2020-10-21 | Discharge: 2020-10-21 | Disposition: A | Discharge status: Home / Self Care | Payer: Commercial Managed Care - PPO | Source: Ambulatory Visit | Attending: Internal Medicine | Admitting: Internal Medicine

## 2020-10-21 VITALS — BP 138/78 | HR 84 | Wt 188.4 lb

## 2020-10-21 DIAGNOSIS — I427 Cardiomyopathy due to drug and external agent: Secondary | ICD-10-CM | POA: Diagnosis not present

## 2020-10-21 DIAGNOSIS — Z8249 Family history of ischemic heart disease and other diseases of the circulatory system: Secondary | ICD-10-CM | POA: Diagnosis not present

## 2020-10-21 DIAGNOSIS — I110 Hypertensive heart disease with heart failure: Secondary | ICD-10-CM | POA: Insufficient documentation

## 2020-10-21 DIAGNOSIS — T451X5A Adverse effect of antineoplastic and immunosuppressive drugs, initial encounter: Secondary | ICD-10-CM

## 2020-10-21 DIAGNOSIS — Z79899 Other long term (current) drug therapy: Secondary | ICD-10-CM | POA: Diagnosis not present

## 2020-10-21 DIAGNOSIS — C7951 Secondary malignant neoplasm of bone: Secondary | ICD-10-CM | POA: Diagnosis not present

## 2020-10-21 DIAGNOSIS — C50911 Malignant neoplasm of unspecified site of right female breast: Secondary | ICD-10-CM | POA: Diagnosis not present

## 2020-10-21 DIAGNOSIS — I10 Essential (primary) hypertension: Secondary | ICD-10-CM | POA: Diagnosis not present

## 2020-10-21 DIAGNOSIS — I509 Heart failure, unspecified: Secondary | ICD-10-CM | POA: Diagnosis not present

## 2020-10-21 MED ORDER — LOSARTAN POTASSIUM 25 MG PO TABS: 25.0000 mg | ORAL_TABLET | Freq: Every day | ORAL | 3 refills | Status: DC

## 2020-10-21 NOTE — Progress Notes (Signed)
CARDIO-ONCOLOGY CLINIC CONSULT NOTE  Referring Physician: Dr. Burr Medico Primary Care: None Primary Cardiologist: New  HPI:  Ms. Carillo is 60 y.o. female with HTN and stage IV right breast cancer referred by Dr. Jake Michaelis or enrollment into the Cardio-Oncology program due to reduced EF on echo.   Diagnosed with R breast cancer in 8/21 (triple positive) found to have mets to bone. She received THP 01/03/20-05/27/20. Her restaging PET scan from 04/28/20 showed no hypermetabolic lesions. Given her excellent response, she was switched to maintenance therapy with Herceptin, Perjeta q3weeks and Letrozole starting 06/18/20 which is still ongoing.  Zometa started 06/18/20  Earlier this month had echo which showed EF 50-55% so H/P held. Feels fine.No h/o of any heart problems. No SOB, orthopnea or PND. HTN been under control for a long time.   ECHO 8/21: EF 60-65% G1DD ECHO 12/21: EF 60-65% GLS -18.2 ECHO  09/22/20: EF 40-45%  (I felt 50-55%)    Review of Systems: [y] = yes, [ ]  = no   General: Weight gain [ ] ; Weight loss [ ] ; Anorexia [ ] ; Fatigue [ ] ; Fever [ ] ; Chills [ ] ; Weakness [ ]   Cardiac: Chest pain/pressure [ ] ; Resting SOB [ ] ; Exertional SOB [ ] ; Orthopnea [ ] ; Pedal Edema [ ] ; Palpitations [ ] ; Syncope [ ] ; Presyncope [ ] ; Paroxysmal nocturnal dyspnea[ ]   Pulmonary: Cough [ ] ; Wheezing[ ] ; Hemoptysis[ ] ; Sputum [ ] ; Snoring [ ]   GI: Vomiting[ ] ; Dysphagia[ ] ; Melena[ ] ; Hematochezia [ ] ; Heartburn[ ] ; Abdominal pain [ ] ; Constipation [ ] ; Diarrhea [ ] ; BRBPR [ ]   GU: Hematuria[ ] ; Dysuria [ ] ; Nocturia[ ]   Vascular: Pain in legs with walking [ ] ; Pain in feet with lying flat [ ] ; Non-healing sores [ ] ; Stroke [ ] ; TIA [ ] ; Slurred speech [ ] ;  Neuro: Headaches[ ] ; Vertigo[ ] ; Seizures[ ] ; Paresthesias[ ] ;Blurred vision [ ] ; Diplopia [ ] ; Vision changes [ ]   Ortho/Skin: Arthritis [ ] ; Joint pain [ ] ; Muscle pain [ ] ; Joint swelling [ ] ; Back Pain [ ] ; Rash [ ]   Psych: Depression[ ] ; Anxiety[ ]    Heme: Bleeding problems [ ] ; Clotting disorders [ ] ; Anemia [ y]  Endocrine: Diabetes [ ] ; Thyroid dysfunction[ ]    Past Medical History:  Diagnosis Date   Cancer ([place]) 2021   Hypertension     Current Outpatient Medications  Medication Sig Dispense Refill   acetaminophen (TYLENOL) 500 MG tablet Take 1,000 mg by mouth every 6 (six) hours as needed for moderate pain or headache.     amLODipine (NORVASC) 10 MG tablet Take 10 mg by mouth daily.     Cholecalciferol (VITAMIN D3) 50 MCG (2000 UT) TABS Take 2,000 Units by mouth daily.      KLOR-CON M20 20 MEQ tablet TAKE 1 TABLET BY MOUTH EVERY DAY 90 tablet 1   letrozole (FEMARA) 2.5 MG tablet TAKE 1 TABLET BY MOUTH EVERY DAY 90 tablet 1   lidocaine-prilocaine (EMLA) cream Apply 1 application topically as needed. 30 g 1   metoprolol succinate (TOPROL-XL) 50 MG 24 hr tablet Take 50 mg by mouth daily.     omeprazole (PRILOSEC OTC) 20 MG tablet Take 20 mg by mouth daily.     No current facility-administered medications for this encounter.    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   Tobacco Use   Smoking status: Never   Smokeless tobacco: Never  Vaping Use   Vaping Use: Never used  Substance and Sexual Activity   Alcohol use: Yes    Comment: social drinker    Drug use: No   Sexual activity: Yes  Other Topics Concern   Not on file  Social History Narrative   Not on file   Social Determinants of Health   Financial Resource Strain: Low Risk    Difficulty of Paying Living Expenses: Not hard at all  Food Insecurity: No Food Insecurity   Worried About Charity fundraiser in the Last Year: Never true   [place] in the Last Year: Never true  Transportation Needs: No Transportation Needs    Lack of Transportation (Medical): No   Lack of Transportation (Non-Medical): No  Physical Activity: Not on file  Stress: Not on file  Social Connections: Not on file  Intimate Partner Violence: Not on file      Family History  Problem Relation Age of Onset   Hypertension Mother    Hypertension Father     Vitals:   10/21/20 1243  BP: 138/78  Pulse: 84  SpO2: 98%  Weight: 85.5 kg (188 lb 6.4 oz)    PHYSICAL EXAM: General:  Well appearing. No respiratory difficulty HEENT: normal Neck: supple. no JVD. Carotids 2+ bilat; no bruits. No lymphadenopathy or thryomegaly appreciated. Cor: PMI nondisplaced. Regular rate & rhythm. No rubs, gallops or murmurs. Lungs: clear Abdomen: soft, nontender, nondistended. No hepatosplenomegaly. No bruits or masses. Good bowel sounds. Extremities: no cyanosis, clubbing, rash, edema Neuro: alert & oriented x 3, cranial nerves grossly intact. moves all 4 extremities w/o difficulty. Affect pleasant.  ECG: NSR No ST-T wave abnormalities.     ASSESSMENT & PLAN:  1.  Chemotherapy-induced CM - ECHO 8/21: EF 60-65% G1DD - ECHO 12/21: EF 60-65% GLS -18.2 - ECHO  09/22/20: EF 40-45%  (I felt 50-55%) - She has a very mild cardiomyopathy, suspect related to Herceptin. Explained incidence of Herceptin cardiotoxicity and role of Cardio-oncology clinic at length. Echo images reviewed personally.  - For now would hold Herceptin. Add losartan 25mg  qhs. Continue Toprol  - Will repeat echo in 2 weeks. If EF normalizes can restart Herceptin  2. Stage IV Right Breast Cancer (triple positive)  - Diagnosed with R breast cancer in 8/21 (triple positive) found to have mets to bone. She received THP 01/03/20-05/27/20. Her restaging PET scan from 04/28/20 showed no hypermetabolic lesions. Given her excellent response, she was switched to maintenance therapy with Herceptin, Perjeta q3weeks and Letrozole starting 06/18/20.  Zometa started 06/18/20, will continue every 3  months. - Herceptin held 09/22/20 sue to reduced EF  3. HTN  - mildly elevated - adding losartan for #1   Glori Bickers, MD  1:08 PM

## 2020-10-21 NOTE — Patient Instructions (Signed)
Start Losartan 25 mg every night at bedtime  Your physician has requested that you have an echocardiogram. Echocardiography is a painless test that uses sound waves to create images of your heart. It provides your doctor with information about the size and shape of your heart and how well your heart's chambers and valves are working. This procedure takes approximately one hour. There are no restrictions for this procedure.  Your physician recommends that you schedule a follow-up appointment in: 2 months  If you have any questions or concerns before your next appointment please send [place] a message through [place] or call our office at [phone].    TO LEAVE A MESSAGE FOR THE NURSE SELECT OPTION 2, PLEASE LEAVE A MESSAGE INCLUDING: YOUR NAME DATE OF BIRTH CALL BACK NUMBER REASON FOR CALL**this is important as we prioritize the call backs  YOU WILL RECEIVE A CALL BACK THE SAME DAY AS LONG AS YOU CALL BEFORE 4:00 PM  At the [facility], you and your health needs are our priority. As part of our continuing mission to provide you with exceptional heart care, we have created designated Provider Care Teams. These Care Teams include your primary Cardiologist (physician) and Advanced Practice Providers (APPs- Physician Assistants and Nurse Practitioners) who all work together to provide you with the care you need, when you need it.   You may see any of the following providers on your designated Care Team at your next follow up: Dr Glori Bickers Dr Loralie Champagne Dr Patrice Paradise, NP Lyda Jester, [place] Ginnie Smart Audry Riles, PharmD   Please be sure to bring in all your medications bottles to every appointment.

## 2020-10-22 ENCOUNTER — Inpatient Hospital Stay: Payer: Commercial Managed Care - PPO

## 2020-10-22 ENCOUNTER — Inpatient Hospital Stay (HOSPITAL_BASED_OUTPATIENT_CLINIC_OR_DEPARTMENT_OTHER): Payer: Commercial Managed Care - PPO | Admitting: Hematology

## 2020-10-22 ENCOUNTER — Other Ambulatory Visit: Payer: Commercial Managed Care - PPO

## 2020-10-22 ENCOUNTER — Encounter: Payer: Self-pay | Admitting: Hematology

## 2020-10-22 ENCOUNTER — Inpatient Hospital Stay: Payer: Commercial Managed Care - PPO | Attending: Hematology

## 2020-10-22 VITALS — BP 131/78 | HR 81 | Temp 97.5°F | Resp 17 | Wt 186.6 lb

## 2020-10-22 DIAGNOSIS — I10 Essential (primary) hypertension: Secondary | ICD-10-CM | POA: Insufficient documentation

## 2020-10-22 DIAGNOSIS — C50411 Malignant neoplasm of upper-outer quadrant of right female breast: Secondary | ICD-10-CM | POA: Insufficient documentation

## 2020-10-22 DIAGNOSIS — C7951 Secondary malignant neoplasm of bone: Secondary | ICD-10-CM | POA: Insufficient documentation

## 2020-10-22 DIAGNOSIS — Z170 Estrogen receptor positive status [ER+]: Secondary | ICD-10-CM | POA: Diagnosis not present

## 2020-10-22 DIAGNOSIS — I427 Cardiomyopathy due to drug and external agent: Secondary | ICD-10-CM | POA: Diagnosis not present

## 2020-10-22 LAB — CBC WITH DIFFERENTIAL (CANCER CENTER ONLY)
Abs Immature Granulocytes: 0.01 10*3/uL (ref 0.00–0.07)
Basophils Absolute: 0 10*3/uL (ref 0.0–0.1)
Basophils Relative: 0 %
Eosinophils Absolute: 0.1 10*3/uL (ref 0.0–0.5)
Eosinophils Relative: 3 %
HCT: 39.3 % (ref 36.0–46.0)
Hemoglobin: 12.9 g/dL (ref 12.0–15.0)
Immature Granulocytes: 0 %
Lymphocytes Relative: 48 %
Lymphs Abs: 2.6 10*3/uL (ref 0.7–4.0)
MCH: 28.9 pg (ref 26.0–34.0)
MCHC: 32.8 g/dL (ref 30.0–36.0)
MCV: 88.1 fL (ref 80.0–100.0)
Monocytes Absolute: 0.6 10*3/uL (ref 0.1–1.0)
Monocytes Relative: 11 %
Neutro Abs: 2.1 10*3/uL (ref 1.7–7.7)
Neutrophils Relative %: 38 %
Platelet Count: 290 10*3/uL (ref 150–400)
RBC: 4.46 MIL/uL (ref 3.87–5.11)
RDW: 15.1 % (ref 11.5–15.5)
WBC Count: 5.5 10*3/uL (ref 4.0–10.5)
nRBC: 0 % (ref 0.0–0.2)

## 2020-10-22 LAB — CMP (CANCER CENTER ONLY)
ALT: 9 U/L (ref 0–44)
AST: 14 U/L — ABNORMAL LOW (ref 15–41)
Albumin: 3.8 g/dL (ref 3.5–5.0)
Alkaline Phosphatase: 84 U/L (ref 38–126)
Anion gap: 9 (ref 5–15)
BUN: 12 mg/dL (ref 6–20)
CO2: 26 mmol/L (ref 22–32)
Calcium: 9 mg/dL (ref 8.9–10.3)
Chloride: 109 mmol/L (ref 98–111)
Creatinine: 0.84 mg/dL (ref 0.44–1.00)
GFR, Estimated: >60 mL/min (ref >60)
Glucose, Bld: 106 mg/dL — ABNORMAL HIGH (ref 70–99)
Potassium: 3.6 mmol/L (ref 3.5–5.1)
Sodium: 144 mmol/L (ref 135–145)
Total Bilirubin: 0.4 mg/dL (ref 0.3–1.2)
Total Protein: 7.2 g/dL (ref 6.5–8.1)

## 2020-10-22 MED ORDER — HEPARIN SOD (PORK) LOCK FLUSH 100 UNIT/ML IV SOLN
500.0000 [IU] | Freq: Once | INTRAVENOUS | Status: AC
  Administered 2020-10-22: 500 [IU]
  Filled 2020-10-22: qty 5

## 2020-10-22 MED ORDER — METOPROLOL SUCCINATE ER 50 MG PO TB24: 50.0000 mg | ORAL_TABLET | Freq: Every day | ORAL | 0 refills | Status: DC

## 2020-10-22 MED ORDER — AMLODIPINE BESYLATE 10 MG PO TABS: 10.0000 mg | ORAL_TABLET | Freq: Every day | ORAL | 0 refills | Status: DC

## 2020-10-22 MED ORDER — SODIUM CHLORIDE 0.9% FLUSH
10.0000 mL | Freq: Once | INTRAVENOUS | Status: AC
  Administered 2020-10-22: 10 mL
  Filled 2020-10-22: qty 10

## 2020-10-22 NOTE — Progress Notes (Signed)
[place]   [phone] Fax:([phone]) [phone]   Clinic Follow up Note   Patient Care Team: Julian Hy, PA-C as PCP - General (Physician Assistant) [name], MD as Consulting Physician (General Surgery) Truitt Merle, MD as Consulting Physician (Hematology) Kyung Rudd, MD as Consulting Physician (Radiation Oncology) Mauro Kaufmann, RN as Oncology Nurse Navigator Rockwell Germany, RN as Oncology Nurse Navigator  Date of Service:  10/22/2020  CHIEF COMPLAINT: f/u of metastatic right breast cancer  SUMMARY OF ONCOLOGIC HISTORY: Oncology History Overview Note  Cancer Staging Malignant neoplasm of upper-outer quadrant of right breast in female, estrogen receptor positive ([place]) Staging form: Breast, AJCC 8th Edition - Clinical stage from 11/25/2019: Stage IIA (cT3, cN1, cM0, G2, ER+, PR+, HER2+) - Signed by Truitt Merle, MD on 12/03/2019    Malignant neoplasm of upper-outer quadrant of right breast in female, estrogen receptor positive ([place])  11/13/2019 Mammogram    Diagnostic Mammogram 11/13/19  IMPRESSION: -Highly suspicious mass and calcifications in the right breast centered between 9 and 12 o'clock spanning up to 11 cm.  -Multiple masses in the right axilla. One of the masses contains calcifications, denoted as mass number 2 measuring 9 x 11 by 10 mm. Several of these masses do not appear to represent definitive lymph nodes. Others may be small but abnormal appearing lymph nodes. Taken in total, a cluster of masses in the right axilla spans up to 3.7 cm.  -There are fibrocystic changes on the left. There are also some solid-appearing masses. A solid appearing mass at 10:30, 6 cm from the nipple measures 9 x 6 x 6 mm. An adjacent solid mass at 10 o'clock, 6 cm from the nipple measures 8 x 6 by 5 mm. Another solid-appearing mass at 11 o'clock, 1 cm from the nipple measures 5 x 4 by 5 mm. Fibrocystic changes are seen at 10 o'clock, 12 o'clock, and 4  o'clock. Another solid-appearing mass seen at 5 o'clock, 4 cm from the nipple measuring 6 x 3 x 4 mm. There is a single abnormal node in the left axilla with a cortex measuring 5 mm and restriction of the fatty hilum.   11/25/2019 Initial Biopsy   Diagnosis 1. Breast, right, needle core biopsy, upper outer - INVASIVE MAMMARY CARCINOMA - MAMMARY CARCINOMA IN SITU WITH CALCIFICATIONS AND NECROSIS - SEE COMMENT 2. Lymph node, needle/core biopsy, right axilla - INVASIVE MAMMARY CARCINOMA - SEE COMMENT 3. Lymph node, needle/core biopsy, left axilla - BENIGN LYMPH NODE - NO CARCINOMA IDENTIFIED Microscopic Comment 1. The biopsy material shows an infiltrative proliferation of cells arranged linearly and in small clusters. Based on the biopsy, the carcinoma appears Nottingham grade 2 of 3 and measures 1.2 cm in greatest linear extent. E-cadherin and prognostic markers (ER/PR/ki-67/HER2)are pending and will be reported in an addendum. Dr. Tresa Moore reviewed the case and agrees with the above diagnosis. These results were called to [facility] on November 26, 2019. 2. Definitive nodal tissue is not identified. This biopsy has a slightly different architecture morphology than what is seen in part 1. The carcinoma measures 0.7 cm in greatest dimension. E-cadherin and prognostic markers (ER, PR, Ki-67 and HER-2) are pending and will be reported in an addendum.   11/25/2019 Receptors her2   1. PROGNOSTIC INDICATORS Results: IMMUNOHISTOCHEMICAL AND MORPHOMETRIC ANALYSIS PERFORMED MANUALLY The tumor cells are POSITIVE for Her2 (3+). Estrogen Receptor: 90%, POSITIVE, STRONG STAINING INTENSITY Progesterone Receptor: 20%, POSITIVE, STRONG STAINING INTENSITY Proliferation Marker Ki67: 30%  2. PROGNOSTIC INDICATORS Results: IMMUNOHISTOCHEMICAL AND MORPHOMETRIC ANALYSIS PERFORMED MANUALLY The tumor cells are POSITIVE for Her2 (3+). Estrogen Receptor: 90%, POSITIVE, STRONG STAINING  INTENSITY Progesterone Receptor: 25%, POSITIVE, STRONG STAINING INTENSITY Proliferation Marker Ki67: 30%   11/25/2019 Cancer Staging   Staging form: Breast, AJCC 8th Edition - Clinical stage from 11/25/2019: Stage IIA (cT3, cN1, cM0, G2, ER+, PR+, HER2+) - Signed by Truitt Merle, MD on 12/03/2019    12/01/2019 Initial Diagnosis   Malignant neoplasm of upper-outer quadrant of right breast in female, estrogen receptor positive ([place])   12/10/2019 Imaging   Bone Scan  IMPRESSION: Findings concerning for bony metastatic disease in the left acetabulum region, L3 vertebral body region, and anterolateral right tenth rib.   Increased uptake in several large joints likely is of arthropathic etiology.   12/11/2019 Imaging   CT CAP w contrast  IMPRESSION: Prominent glandular tissue in the central right breast with nipple retraction, likely corresponding to the patient's known right breast neoplasm.   Prominent right axillary nodes measuring up to 8 mm short axis, suspicious for nodal metastases in this clinical context.   3 mm subpleural pulmonary nodule in the right lower lobe, likely benign. However, follow-up CT chest is suggested in 3-6 months.   Multiple hepatic lesions, including a dominant 2.4 cm lesion in segment 6, which is considered indeterminate. Metastasis is not excluded. Consider MRI abdomen with/without contrast for further characterization.   11 mm sclerotic lesion along the left posterior aspect of the T7 vertebral body raises concern for isolated osseous metastasis. Correlate with priors if available. Benign vertebral hemangioma at T11.   12/12/2019 Breast MRI   IMPRESSION: 1. The biopsy proven malignancy in the right breast involves all 4 quadrants and spans up to 9 cm by MRI. There is enhancement within the skin at the level of the nipple.   2. Multiple matted masses are seen in the right axilla, either abnormal metastatic lymph nodes or a combination of masses  and metastatic lymph nodes. One of these has been previously biopsied showing invasive mammary carcinoma without definitive nodal tissue.   3.  There is a 1.5 cm Rotter's lymph node on the right.   4. There is markedly abnormal enhancement and edema in the right pectoralis major muscle spanning 6-7 cm, suspicious for metastatic disease.   5. There is diffuse nodular enhancement throughout the left breast, which may correspond with the solid-appearing masses seen on ultrasound. These all have a similar appearance on MRI.   6.  No findings of left axillary lymphadenopathy.   12/15/2019 Procedure   PAC placed    12/24/2019 Imaging   MRI abdomen  IMPRESSION: 1. Multifocal enhancing bone lesions are noted within the lumbar spine, and bony pelvis compatible with metastatic disease. 2. Four, small peripherally enhancing cystic lesions are noted within the liver worrisome for metastatic disease. 3. 2 enhancing liver lesions are also noted with signal and enhancement characteristics consistent with benign liver hemangioma. 4. Well-circumscribed, enhancing T2 and T1 hypointense structure within the right adnexal region is indeterminate. It is unclear whether not this is arising from the right ovary, broad ligament or right side of uterine fundus. Differential considerations include fibrothecoma, versus pedunculated uterine leiomyoma. Metastatic disease is considered less favored. Attention on follow-up imaging is recommended.     01/03/2020 - 05/27/2020 Chemotherapy   First line THP q3weeks starting 01/03/20-05/27/20   01/05/2020 Imaging   US Abdomen  IMPRESSION: 1. Suspect hematoma in the medial right lobe of the liver in  the area of previous biopsy. Note that recent MR does not show lesion in this area. This area has ill-defined margins and measures 3.9 x 4.0 x 3.5 cm. No perihepatic fluid.   2. Probable hemangiomas elsewhere in the right lobe of the liver. Note that several smaller  lesions seen on recent MR are not appreciable by ultrasound.   3.  Study otherwise unremarkable.   01/12/2020 Pathology Results   FINAL MICROSCOPIC DIAGNOSIS:   A. BONE, SCLEROTIC LESION, LEFT ANTERIOR ILIAC SPINE, BIOPSY:  - Metastatic carcinoma, consistent with patient's clinical history of  primary breast carcinoma  - See comment   COMMENT:  Immunohistochemical stains show that the tumor cells are positive for  CK7 and GATA3; and negative for CK20, consistent with above  interpretation.   PROGNOSTIC INDICATOR RESULTS:  The tumor cells are NEGATIVE for Her2 (0); see comment  Estrogen Receptor: NEGATIVE; see comment  Progesterone Receptor: NEGATIVE; see comment    03/23/2020 Imaging   CT CAP IMPRESSION: 1. Right lower lobe perifissural nodule is slightly more conspicuous on today's study. Close attention on follow-up recommended. 2. Interval progression of segment IV liver lesion, concerning for metastatic progression. The remaining visualized liver lesions are stable to smaller in the interval. 3. Interval evolution in appearance of thoracolumbar spinal lesions and left iliac crest lesion, potentially reflecting response to therapy/healing. No definite new osseous lesions on today's study. 4. New small fluid collection right cul-de-sac. 5. Probable fibroid change in the uterus including exophytic right fundal lesion. 6. Aortic Atherosclerosis (ICD10-I70.0).   03/31/2020 Imaging   MR ABD IMPRESSION: 1. Enlarging hepatic lesion in hepatic subsegment IV suspicious for hepatic metastasis. The it is uncertain whether the change in the short interval is due to technical factors with different modalities or true enlargement. 2. Lesion in the posterior RIGHT hepatic lobe with imaging features that are atypical for hemangioma but stability and signs of enhancement on later phase raising this question. Another more classic appearing may angioma seen inferior to this location  in hepatic subsegment VI. Attention on follow-up. 3. Heterogeneous enhancement throughout the spine particularly at L3 as exhibited on the prior study with very patchy marrow signal remains suspicious for bony metastatic lesions in this patient with known bone metastases.   04/01/2020 Imaging   Bone scan IMPRESSION: Good response to therapy with resolution of sites of uptake seen previously at the cervical spine and posterior RIGHT tenth rib as well as a diminished degree of uptake at remaining previously identified osseous metastatic foci.   04/28/2020 PET scan   IMPRESSION: 1. No hypermetabolic lesions to suggest active metastatic disease involving the liver or osseous structures. 2. Stable small low-attenuation liver lesions, likely treated disease.   06/18/2020 -  Chemotherapy   Given her excellent response, I switched to maintenance therapy with Herceptin, Perjeta (Phesgo) q3weeks and Letrozole.   06/18/2020 -  Chemotherapy   Zometa starting 06/18/20   06/18/2020 -  Anti-estrogen oral therapy   Letrozole 2.$RemoveBefo'5mg'FhcniSaxpGr$  once dialy.    09/03/2020 Imaging   Bone Scan IMPRESSION: 1. Significant interval improvement compared to previous studies. Only mild uptake remains in the pelvis as above. There has been significant interval improvement over time.  CT C/A/P IMPRESSION: 1. Hypodense liver lesions are slightly decreased in size compared to prior examination, consistent with treatment response of hepatic metastatic disease. 2. Unchanged sclerotic lesion of the anterior left iliac crest. Unchanged lytic superior endplate deformity of L3, which remains equivocal for metastatic lesion versus mechanical Schmorl deformity. No  CT evidence of new osseous metastatic disease. 3. No evidence of new metastatic disease in the chest, abdomen, or pelvis. 4. Coronary artery disease.      CURRENT THERAPY:  Maintenance Herceptin and Perjeta (Phesgo) q3weeks starting 06/18/20, held starting 09/22/20  due to low EF Letrozole 2.52m daily starting 06/19/23 Zometa starting 06/18/20  INTERVAL HISTORY:  KBettie Capistranis here for a follow up of metastatic breast cancer. She was last seen by me on 09/09/20. She presents to the clinic alone. She reports she missed her first cardiology appointment because of issues with her phone, which she uses to keep track of all her appointments. She notes she was without a phone for about a week. She relayed her discussion with Dr. BHaroldine Lawsabout intermittent herceptin treatment, such as going to q6weeks instead of 3. He did not mention this in his note. She notes her PCP recently left the practice, so she is having to look for a new one.  All other systems were reviewed with the patient and are negative.  MEDICAL HISTORY:  Past Medical History:  Diagnosis Date   [place]   Hypertension     SURGICAL HISTORY: Past Surgical History:  Procedure Laterality Date   left parotid tumor removal   2006   PORTACATH PLACEMENT Left 12/15/2019   Procedure: INSERTION PORT-A-CATH WITH ULTRASOUND GUIDANCE;  Surgeon: BStark Klein MD;  [facility]  Service: General;  Laterality: Left;   TONSILLECTOMY      I have reviewed the social history and family history with the patient and they are unchanged from previous note.  ALLERGIES:  has No Known Allergies.  MEDICATIONS:  Current Outpatient Medications  Medication Sig Dispense Refill   acetaminophen (TYLENOL) 500 MG tablet Take 1,000 mg by mouth every 6 (six) hours as needed for moderate pain or headache.     amLODipine (NORVASC) 10 MG tablet Take 1 tablet (10 mg total) by mouth daily. 90 tablet 0   Cholecalciferol (VITAMIN D3) 50 MCG (2000 UT) TABS Take 2,000 Units by mouth daily.      KLOR-CON M20 20 MEQ tablet TAKE 1 TABLET BY MOUTH EVERY DAY 90 tablet 1   letrozole (FEMARA) 2.5 MG tablet TAKE 1 TABLET BY MOUTH EVERY DAY 90 tablet 1   lidocaine-prilocaine (EMLA) cream Apply 1 application topically as  needed. 30 g 1   losartan (COZAAR) 25 MG tablet Take 1 tablet (25 mg total) by mouth daily. 90 tablet 3   metoprolol succinate (TOPROL-XL) 50 MG 24 hr tablet Take 1 tablet (50 mg total) by mouth daily. 90 tablet 0   omeprazole (PRILOSEC OTC) 20 MG tablet Take 20 mg by mouth daily.     No current facility-administered medications for this visit.    PHYSICAL EXAMINATION: ECOG PERFORMANCE STATUS: 1 - Symptomatic but completely ambulatory  Vitals:   10/22/20 1048  BP: 131/78  Pulse: 81  Resp: 17  Temp: (!) 97.5 F (36.4 C)  SpO2: 100%   Filed Weights   10/22/20 1048  Weight: 186 lb 9.6 oz (84.6 kg)    GENERAL:alert, no distress and comfortable SKIN: skin color, texture, turgor are normal, no rashes or significant lesions EYES: normal, Conjunctiva are pink and non-injected, sclera clear  NECK: supple, thyroid normal size, non-tender, without nodularity LYMPH:  no palpable lymphadenopathy in the cervical, axillary  LUNGS: clear to auscultation and percussion with normal breathing effort HEART: regular rate & rhythm and no murmurs and no lower extremity edema ABDOMEN:abdomen soft, non-tender and  normal bowel sounds Musculoskeletal:no cyanosis of digits and no clubbing  NEURO: alert & oriented x 3 with fluent speech, no focal motor/sensory deficits BREAST: No palpable mass, nodules or adenopathy bilaterally. Breast exam benign.  LABORATORY DATA:  I have reviewed the data as listed CBC Latest Ref Rng & Units 10/22/2020 09/03/2020 07/29/2020  WBC 4.0 - 10.5 K/uL 5.5 5.6 4.9  Hemoglobin 12.0 - 15.0 g/dL 12.9 12.6 11.9(L)  Hematocrit 36.0 - 46.0 % 39.3 39.2 37.8  Platelets 150 - 400 K/uL 290 260 278     CMP Latest Ref Rng & Units 10/22/2020 09/03/2020 07/29/2020  Glucose 70 - 99 mg/dL 106(H) 99 107(H)  BUN 6 - 20 mg/dL _0 Creatinine 0.44 - 1.00 mg/dL 0.84 0.77 0.75  Sodium 135 - 145 mmol/L 144 143 144  Potassium 3.5 - 5.1 mmol/L 3.6 3.4(L) 3.3(L)  Chloride 98 - 111 mmol/L 109  107 110  CO2 22 - 32 mmol/L _1 Calcium 8.9 - 10.3 mg/dL 9.0 9.7 8.6(L)  Total Protein 6.5 - 8.1 g/dL 7.2 6.8 6.7  Total Bilirubin 0.3 - 1.2 mg/dL 0.4 0.4 0.5  Alkaline Phos 38 - 126 U/L 84 83 80  AST 15 - 41 U/L 14(L) 14(L) 14(L)  ALT 0 - 44 U/L 9 <6 6    RADIOGRAPHIC STUDIES: I have personally reviewed the radiological images as listed and agreed with the findings in the report. No results found.   ASSESSMENT & PLAN:  Carolyn Stewart is a 60 y.o. female with   1. Malignant neoplasm of upper-outer quadrant of right breast, ductal carcinoma, Stage IIA, c(T3N1Mx), with bone and possible liver mets,  ER+/PR+/HER2+, Grade II-III  -She was diagnosed in 11/2019 with a large right breast mass in 4 quadrants measuring up to 11cm and right lymphadenopathy. Her right breast and LN biopsy was positive for invasive ductal carcinoma with components of DCIS. Her left axillary node biopsy was benign.  -01/01/20 Liver biopsy was negative for malignant cells but her 01/12/20 Bone Biopsy was positive for metastatic carcinoma from primary breast cancer. Her ER/PR/HER2 were all negative bone biopsy, possibly related to decalcification. -She was also seen to have left breast mass on her 12/12/19 MRI, but given metastatic disease, no further biopsy is needed due to her metastatic disease. -We previously discussed that she has stage IV metastatic breast cancer which is not curable but very treatable. We do not plan to offer right breast surgery in the near future, but may be an option down the road. -She received THP 01/03/20-05/27/20. Her restaging PET scan from 04/28/20 showed no hypermetabolic lesions. Given her excellent response, I switched to maintenance therapy with Herceptin, Perjeta (Phesgo) q3weeks and Letrozole starting 06/18/20.   -the goal of therapy is disease control, not cure -Zometa started 06/18/20, will continue every 3 months.  I encouraged her to keep up with dental care. She has no active dental  issues. She is on calcium and VitD -Restaging CT and bone scan on 09/03/20 showed interval treatment response, both to osseous metastases and liver lesions. Will continue current maintenance therapy  -we are holding her HP due to decreased EF. -CBC reviewed, WNL  2. Chemotherapy-induced cardiomyopathy -echo 09/22/20 showed decreased EF -She was evaluated by Dr. Haroldine Laws yesterday, 10/21/20, and was started on losartan. -He also recommended holding herceptin. -She will undergo repeat echo in 2 weeks.   3. HTN -Well controlled on Metoprolol and Amlodipine.  -She is without a PCP at the moment, so  I refilled these for her today.     PLAN:  -I refilled her amlodipine and metoprolol today -hold HP injection -continue letrozole -She is already scheduled for labs and f/u with me and phesgo injection on 11/12/20.    No problem-specific Assessment & Plan notes found for this encounter.   No orders of the defined types were placed in this encounter.  All questions were answered. The patient knows to call the clinic with any problems, questions or concerns. No barriers to learning was detected. The total time spent in the appointment was 30 minutes.     Truitt Merle, MD 10/22/2020   I, Wilburn Mylar, am acting as scribe for Truitt Merle, MD.   I have reviewed the above documentation for accuracy and completeness, and I agree with the above.

## 2020-10-22 NOTE — Patient Instructions (Signed)
Implanted Port Home Guide An implanted port is a device that is placed under the skin. It is usually placed in the chest. The device can be used to give IV medicine, to take blood, or for dialysis. You may have an implanted port if: You need IV medicine that would be irritating to the small veins in your hands or arms. You need IV medicines, such as antibiotics, for a long period of time. You need IV nutrition for a long period of time. You need dialysis. When you have a port, your health care provider can choose to use the port instead of veins in your arms for these procedures. You may have fewer limitations when using a port than you would if you used other types of long-term IVs, and you will likely be able to return to normal activities afteryour incision heals. An implanted port has two main parts: Reservoir. The reservoir is the part where a needle is inserted to give medicines or draw blood. The reservoir is round. After it is placed, it appears as a small, raised area under your skin. Catheter. The catheter is a thin, flexible tube that connects the reservoir to a vein. Medicine that is inserted into the reservoir goes into the catheter and then into the vein. How is my port accessed? To access your port: A numbing cream may be placed on the skin over the port site. Your health care provider will put on a mask and sterile gloves. The skin over your port will be cleaned carefully with a germ-killing soap and allowed to dry. Your health care provider will gently pinch the port and insert a needle into it. Your health care provider will check for a blood return to make sure the port is in the vein and is not clogged. If your port needs to remain accessed to get medicine continuously (constant infusion), your health care provider will place a clear bandage (dressing) over the needle site. The dressing and needle will need to be changed every week, or as told by your health care provider. What  is flushing? Flushing helps keep the port from getting clogged. Follow instructions from your health care provider about how and when to flush the port. Ports are usually flushed with saline solution or a medicine called heparin. The need for flushing will depend on how the port is used: If the port is only used from time to time to give medicines or draw blood, the port may need to be flushed: Before and after medicines have been given. Before and after blood has been drawn. As part of routine maintenance. Flushing may be recommended every 4-6 weeks. If a constant infusion is running, the port may not need to be flushed. Throw away any syringes in a disposal container that is meant for sharp items (sharps container). You can buy a sharps container from a pharmacy, or you can make one by using an empty hard plastic bottle with a cover. How long will my port stay implanted? The port can stay in for as long as your health care provider thinks it is needed. When it is time for the port to come out, a surgery will be done to remove it. The surgery will be similar to the procedure that was done to putthe port in. Follow these instructions at home:  Flush your port as told by your health care provider. If you need an infusion over several days, follow instructions from your health care provider about how to take   care of your port site. Make sure you: Wash your hands with soap and water before you change your dressing. If soap and water are not available, use alcohol-based hand sanitizer. Change your dressing as told by your health care provider. Place any used dressings or infusion bags into a plastic bag. Throw that bag in the trash. Keep the dressing that covers the needle clean and dry. Do not get it wet. Do not use scissors or sharp objects near the tube. Keep the tube clamped, unless it is being used. Check your port site every day for signs of infection. Check for: Redness, swelling, or  pain. Fluid or blood. Pus or a bad smell. Protect the skin around the port site. Avoid wearing bra straps that rub or irritate the site. Protect the skin around your port from seat belts. Place a soft pad over your chest if needed. Bathe or shower as told by your health care provider. The site may get wet as long as you are not actively receiving an infusion. Return to your normal activities as told by your health care provider. Ask your health care provider what activities are safe for you. Carry a medical alert card or wear a medical alert bracelet at all times. This will let health care providers know that you have an implanted port in case of an emergency. Get help right away if: You have redness, swelling, or pain at the port site. You have fluid or blood coming from your port site. You have pus or a bad smell coming from the port site. You have a fever. Summary Implanted ports are usually placed in the chest for long-term IV access. Follow instructions from your health care provider about flushing the port and changing bandages (dressings). Take care of the area around your port by avoiding clothing that puts pressure on the area, and by watching for signs of infection. Protect the skin around your port from seat belts. Place a soft pad over your chest if needed. Get help right away if you have a fever or you have redness, swelling, pain, drainage, or a bad smell at the port site. This information is not intended to replace advice given to you by your health care provider. Make sure you discuss any questions you have with your healthcare provider. Document Revised: 08/25/2019 Document Reviewed: 08/25/2019 Elsevier Patient Education  2022 [employer]  

## 2020-10-23 LAB — CANCER ANTIGEN 27.29: CA 27.29: 35.1 U/mL (ref 0.0–38.6)

## 2020-11-04 ENCOUNTER — Ambulatory Visit (HOSPITAL_COMMUNITY): Admission: RE | Admit: 2020-11-04 | Payer: Commercial Managed Care - PPO | Source: Ambulatory Visit

## 2020-11-08 ENCOUNTER — Telehealth: Payer: Self-pay

## 2020-11-08 ENCOUNTER — Other Ambulatory Visit (HOSPITAL_COMMUNITY): Payer: Self-pay

## 2020-11-08 ENCOUNTER — Encounter: Payer: Self-pay | Admitting: Hematology

## 2020-11-08 ENCOUNTER — Other Ambulatory Visit: Payer: Self-pay | Admitting: Hematology

## 2020-11-08 MED ORDER — PAXLOVID 20 X 150 MG & 10 X 100MG PO TBPK
3.0000 | ORAL_TABLET | Freq: Two times a day (BID) | ORAL | 0 refills | Status: DC
  Filled 2020-11-08: qty 30, 5d supply, fill #0

## 2020-11-08 NOTE — Telephone Encounter (Signed)
This nurse called patient and informed that Dr. Burr Medico called in a prescription for Paxlovid to the [facility] to treat her symptoms for Covid.  Provided address and phone number to the pharmacy. Patient denies SOB and decreased appetite at this time. This nurse encouraged patient to stay hydrated as much as possible. Patient acknowledged understanding. No further questions or concerns at this time.  Patient knows to call clinic with questions, concerns or any problems.

## 2020-11-08 NOTE — Telephone Encounter (Signed)
This nurse spoke with patient who called and stated that she tested positive for Covid. Patient stats that she has been having symptoms that include, coughing, chest congestion, bad headaches and night fevers that started on the night of Friday 11/05/20 and she had herself tested on 11/06/20. This nurse advised patient that this information will be forwarded to MD and this nurse will follow up.   No further questions or concerns at this time.

## 2020-11-09 ENCOUNTER — Other Ambulatory Visit (HOSPITAL_COMMUNITY): Payer: Commercial Managed Care - PPO

## 2020-11-11 ENCOUNTER — Telehealth: Payer: Self-pay

## 2020-11-11 NOTE — Telephone Encounter (Signed)
This nurse called and spoke with patient to see how she is feeling.  Patient stated that she is feeling much better since Starting the Paxlovid.  Tested on Monday 7/18 and her symptoms are going away.  Patient made aware that her appts for 7/22 have been cancelled.  Advised of [facility] policy that she can return after 10 days from her positive test and she has to be asymptomatic.  Patient acknowledged understanding.  No further questions or concerns at this time.

## 2020-11-12 ENCOUNTER — Ambulatory Visit: Payer: Commercial Managed Care - PPO | Admitting: Hematology

## 2020-11-12 ENCOUNTER — Ambulatory Visit: Payer: Commercial Managed Care - PPO

## 2020-11-12 ENCOUNTER — Other Ambulatory Visit: Payer: Commercial Managed Care - PPO

## 2020-11-18 ENCOUNTER — Ambulatory Visit (HOSPITAL_COMMUNITY)
Admission: RE | Admit: 2020-11-18 | Discharge: 2020-11-18 | Disposition: A | Discharge status: Home / Self Care | Payer: Commercial Managed Care - PPO | Source: Ambulatory Visit | Attending: Internal Medicine | Admitting: Internal Medicine

## 2020-11-18 ENCOUNTER — Other Ambulatory Visit: Payer: Self-pay

## 2020-11-18 ENCOUNTER — Telehealth: Payer: Self-pay

## 2020-11-18 DIAGNOSIS — I509 Heart failure, unspecified: Secondary | ICD-10-CM | POA: Insufficient documentation

## 2020-11-18 DIAGNOSIS — I351 Nonrheumatic aortic (valve) insufficiency: Secondary | ICD-10-CM | POA: Diagnosis not present

## 2020-11-18 DIAGNOSIS — I5030 Unspecified diastolic (congestive) heart failure: Secondary | ICD-10-CM | POA: Diagnosis not present

## 2020-11-18 DIAGNOSIS — I10 Essential (primary) hypertension: Secondary | ICD-10-CM | POA: Diagnosis not present

## 2020-11-18 DIAGNOSIS — Z01810 Encounter for preprocedural cardiovascular examination: Secondary | ICD-10-CM | POA: Diagnosis not present

## 2020-11-18 DIAGNOSIS — C50919 Malignant neoplasm of unspecified site of unspecified female breast: Secondary | ICD-10-CM | POA: Diagnosis not present

## 2020-11-18 LAB — ECHOCARDIOGRAM COMPLETE
Area-P 1/2: 4.71 cm2
P 1/2 time: 470 msec
S' Lateral: 3.3 cm
Single Plane A4C EF: 67 %

## 2020-11-18 NOTE — Progress Notes (Signed)
  Echocardiogram 2D Echocardiogram has been performed.  Carolyn Stewart 11/18/2020, 10:41 AM

## 2020-11-18 NOTE — Telephone Encounter (Signed)
This nurse called and spoke with patient for 10 day Covid follow up.  Patient states she is asymptomatic at this time.  She retested on Monday 11/15/20 so that she can return to work and results were negative and she is back working.  This nurse informed that this information has been forwarded to the provider and scheduling will reach out to her to reschedule her appointments.

## 2020-11-19 ENCOUNTER — Telehealth: Payer: Self-pay | Admitting: Hematology

## 2020-11-19 NOTE — Telephone Encounter (Signed)
Scheduled appts per 7/28 sch msg. Called pt, no answer. Left msg with appts date and times.

## 2020-11-19 NOTE — Telephone Encounter (Signed)
Patient called to cancel upcoming appointment and requested future appointments on Fridays.

## 2020-11-22 ENCOUNTER — Ambulatory Visit: Payer: Commercial Managed Care - PPO

## 2020-11-22 ENCOUNTER — Ambulatory Visit: Payer: Commercial Managed Care - PPO | Admitting: Hematology

## 2020-11-22 ENCOUNTER — Other Ambulatory Visit: Payer: Commercial Managed Care - PPO

## 2020-11-25 ENCOUNTER — Other Ambulatory Visit: Payer: Commercial Managed Care - PPO

## 2020-11-25 ENCOUNTER — Inpatient Hospital Stay: Payer: Commercial Managed Care - PPO | Attending: Hematology

## 2020-11-25 ENCOUNTER — Other Ambulatory Visit: Payer: Self-pay

## 2020-11-25 ENCOUNTER — Inpatient Hospital Stay (HOSPITAL_BASED_OUTPATIENT_CLINIC_OR_DEPARTMENT_OTHER): Payer: Commercial Managed Care - PPO | Admitting: Hematology

## 2020-11-25 ENCOUNTER — Encounter: Payer: Self-pay | Admitting: Hematology

## 2020-11-25 ENCOUNTER — Ambulatory Visit: Payer: Commercial Managed Care - PPO | Admitting: Hematology

## 2020-11-25 VITALS — BP 132/75 | HR 80 | Temp 98.2°F | Resp 17 | Ht 67.0 in | Wt 189.0 lb

## 2020-11-25 DIAGNOSIS — C50411 Malignant neoplasm of upper-outer quadrant of right female breast: Secondary | ICD-10-CM | POA: Insufficient documentation

## 2020-11-25 DIAGNOSIS — I10 Essential (primary) hypertension: Secondary | ICD-10-CM | POA: Diagnosis not present

## 2020-11-25 DIAGNOSIS — C7951 Secondary malignant neoplasm of bone: Secondary | ICD-10-CM | POA: Insufficient documentation

## 2020-11-25 DIAGNOSIS — Z170 Estrogen receptor positive status [ER+]: Secondary | ICD-10-CM

## 2020-11-25 DIAGNOSIS — I429 Cardiomyopathy, unspecified: Secondary | ICD-10-CM | POA: Insufficient documentation

## 2020-11-25 DIAGNOSIS — Z8616 Personal history of COVID-19: Secondary | ICD-10-CM | POA: Insufficient documentation

## 2020-11-25 DIAGNOSIS — Z5111 Encounter for antineoplastic chemotherapy: Secondary | ICD-10-CM | POA: Insufficient documentation

## 2020-11-25 LAB — CBC WITH DIFFERENTIAL (CANCER CENTER ONLY)
Abs Immature Granulocytes: 0.02 10*3/uL (ref 0.00–0.07)
Basophils Absolute: 0 10*3/uL (ref 0.0–0.1)
Basophils Relative: 0 %
Eosinophils Absolute: 0.1 10*3/uL (ref 0.0–0.5)
Eosinophils Relative: 2 %
HCT: 37.3 % (ref 36.0–46.0)
Hemoglobin: 12 g/dL (ref 12.0–15.0)
Immature Granulocytes: 0 %
Lymphocytes Relative: 43 %
Lymphs Abs: 2.7 10*3/uL (ref 0.7–4.0)
MCH: 28.8 pg (ref 26.0–34.0)
MCHC: 32.2 g/dL (ref 30.0–36.0)
MCV: 89.7 fL (ref 80.0–100.0)
Monocytes Absolute: 0.8 10*3/uL (ref 0.1–1.0)
Monocytes Relative: 12 %
Neutro Abs: 2.6 10*3/uL (ref 1.7–7.7)
Neutrophils Relative %: 43 %
Platelet Count: 315 10*3/uL (ref 150–400)
RBC: 4.16 MIL/uL (ref 3.87–5.11)
RDW: 14.2 % (ref 11.5–15.5)
WBC Count: 6.2 10*3/uL (ref 4.0–10.5)
nRBC: 0 % (ref 0.0–0.2)

## 2020-11-25 LAB — CMP (CANCER CENTER ONLY)
ALT: 17 U/L (ref 0–44)
AST: 18 U/L (ref 15–41)
Albumin: 3.8 g/dL (ref 3.5–5.0)
Alkaline Phosphatase: 79 U/L (ref 38–126)
Anion gap: 8 (ref 5–15)
BUN: 15 mg/dL (ref 6–20)
CO2: 26 mmol/L (ref 22–32)
Calcium: 9.5 mg/dL (ref 8.9–10.3)
Chloride: 107 mmol/L (ref 98–111)
Creatinine: 0.79 mg/dL (ref 0.44–1.00)
GFR, Estimated: >60 mL/min (ref >60)
Glucose, Bld: 90 mg/dL (ref 70–99)
Potassium: 3.8 mmol/L (ref 3.5–5.1)
Sodium: 141 mmol/L (ref 135–145)
Total Bilirubin: 0.4 mg/dL (ref 0.3–1.2)
Total Protein: 7.1 g/dL (ref 6.5–8.1)

## 2020-11-25 MED ORDER — HEPARIN SOD (PORK) LOCK FLUSH 100 UNIT/ML IV SOLN
500.0000 [IU] | Freq: Once | INTRAVENOUS | Status: AC | PRN
  Administered 2020-11-25: 500 [IU]
  Filled 2020-11-25: qty 5

## 2020-11-25 MED ORDER — SODIUM CHLORIDE 0.9% FLUSH
10.0000 mL | Freq: Once | INTRAVENOUS | Status: AC | PRN
  Administered 2020-11-25: 10 mL
  Filled 2020-11-25: qty 10

## 2020-11-25 NOTE — Progress Notes (Addendum)
Carolyn Stewart   Telephone:([phone]) [phone] Fax:(336) [phone]   Clinic Follow up Note   Patient Care Team: Julian Hy, PA-C as PCP - General (Physician Assistant) [name], MD as Consulting Physician (General Surgery) Truitt Merle, MD as Consulting Physician (Hematology) Kyung Rudd, MD as Consulting Physician (Radiation Oncology) Mauro Kaufmann, RN as Oncology Nurse Navigator Rockwell Germany, RN as Oncology Nurse Navigator  Date of Service:  11/25/2020  CHIEF COMPLAINT: f/u of metastatic right breast cancer  SUMMARY OF ONCOLOGIC HISTORY: Oncology History Overview Note  Cancer Staging Malignant neoplasm of upper-outer quadrant of right breast in female, estrogen receptor positive ([place]) Staging form: Breast, AJCC 8th Edition - Clinical stage from 11/25/2019: Stage IIA (cT3, cN1, cM0, G2, ER+, PR+, HER2+) - Signed by Truitt Merle, MD on 12/03/2019    Malignant neoplasm of upper-outer quadrant of right breast in female, estrogen receptor positive ([place])  11/13/2019 Mammogram    Diagnostic Mammogram 11/13/19  IMPRESSION: -Highly suspicious mass and calcifications in the right breast centered between 9 and 12 o'clock spanning up to 11 cm.  -Multiple masses in the right axilla. One of the masses contains calcifications, denoted as mass number 2 measuring 9 x 11 by 10 mm. Several of these masses do not appear to represent definitive lymph nodes. Others may be small but abnormal appearing lymph nodes. Taken in total, a cluster of masses in the right axilla spans up to 3.7 cm.  -There are fibrocystic changes on the left. There are also some solid-appearing masses. A solid appearing mass at 10:30, 6 cm from the nipple measures 9 x 6 x 6 mm. An adjacent solid mass at 10 o'clock, 6 cm from the nipple measures 8 x 6 by 5 mm. Another solid-appearing mass at 11 o'clock, 1 cm from the nipple measures 5 x 4 by 5 mm. Fibrocystic changes are seen at 10 o'clock, 12 o'clock, and 4  o'clock. Another solid-appearing mass seen at 5 o'clock, 4 cm from the nipple measuring 6 x 3 x 4 mm. There is a single abnormal node in the left axilla with a cortex measuring 5 mm and restriction of the fatty hilum.   11/25/2019 Initial Biopsy   Diagnosis 1. Breast, right, needle core biopsy, upper outer - INVASIVE MAMMARY CARCINOMA - MAMMARY CARCINOMA IN SITU WITH CALCIFICATIONS AND NECROSIS - SEE COMMENT 2. Lymph node, needle/core biopsy, right axilla - INVASIVE MAMMARY CARCINOMA - SEE COMMENT 3. Lymph node, needle/core biopsy, left axilla - BENIGN LYMPH NODE - NO CARCINOMA IDENTIFIED Microscopic Comment 1. The biopsy material shows an infiltrative proliferation of cells arranged linearly and in small clusters. Based on the biopsy, the carcinoma appears Nottingham grade 2 of 3 and measures 1.2 cm in greatest linear extent. E-cadherin and prognostic markers (ER/PR/ki-67/HER2)are pending and will be reported in an addendum. Dr. Tresa Moore reviewed the case and agrees with the above diagnosis. These results were called to [facility] on November 26, 2019. 2. Definitive nodal tissue is not identified. This biopsy has a slightly different architecture morphology than what is seen in part 1. The carcinoma measures 0.7 cm in greatest dimension. E-cadherin and prognostic markers (ER, PR, Ki-67 and HER-2) are pending and will be reported in an addendum.   11/25/2019 Receptors her2   1. PROGNOSTIC INDICATORS Results: IMMUNOHISTOCHEMICAL AND MORPHOMETRIC ANALYSIS PERFORMED MANUALLY The tumor cells are POSITIVE for Her2 (3+). Estrogen Receptor: 90%, POSITIVE, STRONG STAINING INTENSITY Progesterone Receptor: 20%, POSITIVE, STRONG STAINING INTENSITY Proliferation Marker Ki67: 30%  2. PROGNOSTIC INDICATORS Results: IMMUNOHISTOCHEMICAL AND MORPHOMETRIC ANALYSIS PERFORMED MANUALLY The tumor cells are POSITIVE for Her2 (3+). Estrogen Receptor: 90%, POSITIVE, STRONG STAINING  INTENSITY Progesterone Receptor: 25%, POSITIVE, STRONG STAINING INTENSITY Proliferation Marker Ki67: 30%   11/25/2019 Cancer Staging   Staging form: Breast, AJCC 8th Edition - Clinical stage from 11/25/2019: Stage IIA (cT3, cN1, cM0, G2, ER+, PR+, HER2+) - Signed by Truitt Merle, MD on 12/03/2019    12/01/2019 Initial Diagnosis   Malignant neoplasm of upper-outer quadrant of right breast in female, estrogen receptor positive ([place])   12/10/2019 Imaging   Bone Scan  IMPRESSION: Findings concerning for bony metastatic disease in the left acetabulum region, L3 vertebral body region, and anterolateral right tenth rib.   Increased uptake in several large joints likely is of arthropathic etiology.   12/11/2019 Imaging   CT CAP w contrast  IMPRESSION: Prominent glandular tissue in the central right breast with nipple retraction, likely corresponding to the patient's known right breast neoplasm.   Prominent right axillary nodes measuring up to 8 mm short axis, suspicious for nodal metastases in this clinical context.   3 mm subpleural pulmonary nodule in the right lower lobe, likely benign. However, follow-up CT chest is suggested in 3-6 months.   Multiple hepatic lesions, including a dominant 2.4 cm lesion in segment 6, which is considered indeterminate. Metastasis is not excluded. Consider MRI abdomen with/without contrast for further characterization.   11 mm sclerotic lesion along the left posterior aspect of the T7 vertebral body raises concern for isolated osseous metastasis. Correlate with priors if available. Benign vertebral hemangioma at T11.   12/12/2019 Breast MRI   IMPRESSION: 1. The biopsy proven malignancy in the right breast involves all 4 quadrants and spans up to 9 cm by MRI. There is enhancement within the skin at the level of the nipple.   2. Multiple matted masses are seen in the right axilla, either abnormal metastatic lymph nodes or a combination of masses  and metastatic lymph nodes. One of these has been previously biopsied showing invasive mammary carcinoma without definitive nodal tissue.   3.  There is a 1.5 cm Rotter's lymph node on the right.   4. There is markedly abnormal enhancement and edema in the right pectoralis major muscle spanning 6-7 cm, suspicious for metastatic disease.   5. There is diffuse nodular enhancement throughout the left breast, which may correspond with the solid-appearing masses seen on ultrasound. These all have a similar appearance on MRI.   6.  No findings of left axillary lymphadenopathy.   12/15/2019 Procedure   PAC placed    12/24/2019 Imaging   MRI abdomen  IMPRESSION: 1. Multifocal enhancing bone lesions are noted within the lumbar spine, and bony pelvis compatible with metastatic disease. 2. Four, small peripherally enhancing cystic lesions are noted within the liver worrisome for metastatic disease. 3. 2 enhancing liver lesions are also noted with signal and enhancement characteristics consistent with benign liver hemangioma. 4. Well-circumscribed, enhancing T2 and T1 hypointense structure within the right adnexal region is indeterminate. It is unclear whether not this is arising from the right ovary, broad ligament or right side of uterine fundus. Differential considerations include fibrothecoma, versus pedunculated uterine leiomyoma. Metastatic disease is considered less favored. Attention on follow-up imaging is recommended.     01/03/2020 - 05/27/2020 Chemotherapy   First line THP q3weeks starting 01/03/20-05/27/20   01/05/2020 Imaging   US Abdomen  IMPRESSION: 1. Suspect hematoma in the medial right lobe of the liver in  the area of previous biopsy. Note that recent MR does not show lesion in this area. This area has ill-defined margins and measures 3.9 x 4.0 x 3.5 cm. No perihepatic fluid.   2. Probable hemangiomas elsewhere in the right lobe of the liver. Note that several smaller  lesions seen on recent MR are not appreciable by ultrasound.   3.  Study otherwise unremarkable.   01/12/2020 Pathology Results   FINAL MICROSCOPIC DIAGNOSIS:   A. BONE, SCLEROTIC LESION, LEFT ANTERIOR ILIAC SPINE, BIOPSY:  - Metastatic carcinoma, consistent with patient's clinical history of  primary breast carcinoma  - See comment   COMMENT:  Immunohistochemical stains show that the tumor cells are positive for  CK7 and GATA3; and negative for CK20, consistent with above  interpretation.   PROGNOSTIC INDICATOR RESULTS:  The tumor cells are NEGATIVE for Her2 (0); see comment  Estrogen Receptor: NEGATIVE; see comment  Progesterone Receptor: NEGATIVE; see comment    03/23/2020 Imaging   CT CAP IMPRESSION: 1. Right lower lobe perifissural nodule is slightly more conspicuous on today's study. Close attention on follow-up recommended. 2. Interval progression of segment IV liver lesion, concerning for metastatic progression. The remaining visualized liver lesions are stable to smaller in the interval. 3. Interval evolution in appearance of thoracolumbar spinal lesions and left iliac crest lesion, potentially reflecting response to therapy/healing. No definite new osseous lesions on today's study. 4. New small fluid collection right cul-de-sac. 5. Probable fibroid change in the uterus including exophytic right fundal lesion. 6. Aortic Atherosclerosis (ICD10-I70.0).   03/31/2020 Imaging   MR ABD IMPRESSION: 1. Enlarging hepatic lesion in hepatic subsegment IV suspicious for hepatic metastasis. The it is uncertain whether the change in the short interval is due to technical factors with different modalities or true enlargement. 2. Lesion in the posterior RIGHT hepatic lobe with imaging features that are atypical for hemangioma but stability and signs of enhancement on later phase raising this question. Another more classic appearing may angioma seen inferior to this location  in hepatic subsegment VI. Attention on follow-up. 3. Heterogeneous enhancement throughout the spine particularly at L3 as exhibited on the prior study with very patchy marrow signal remains suspicious for bony metastatic lesions in this patient with known bone metastases.   04/01/2020 Imaging   Bone scan IMPRESSION: Good response to therapy with resolution of sites of uptake seen previously at the cervical spine and posterior RIGHT tenth rib as well as a diminished degree of uptake at remaining previously identified osseous metastatic foci.   04/28/2020 PET scan   IMPRESSION: 1. No hypermetabolic lesions to suggest active metastatic disease involving the liver or osseous structures. 2. Stable small low-attenuation liver lesions, likely treated disease.   06/18/2020 -  Chemotherapy   Given her excellent response, I switched to maintenance therapy with Herceptin, Perjeta (Phesgo) q3weeks and Letrozole.   06/18/2020 -  Chemotherapy   Zometa starting 06/18/20   06/18/2020 -  Anti-estrogen oral therapy   Letrozole 2.$RemoveBefo'5mg'wFwOJwNtnQG$  once dialy.    09/03/2020 Imaging   Bone Scan IMPRESSION: 1. Significant interval improvement compared to previous studies. Only mild uptake remains in the pelvis as above. There has been significant interval improvement over time.  CT C/A/P IMPRESSION: 1. Hypodense liver lesions are slightly decreased in size compared to prior examination, consistent with treatment response of hepatic metastatic disease. 2. Unchanged sclerotic lesion of the anterior left iliac crest. Unchanged lytic superior endplate deformity of L3, which remains equivocal for metastatic lesion versus mechanical Schmorl deformity. No  CT evidence of new osseous metastatic disease. 3. No evidence of new metastatic disease in the chest, abdomen, or pelvis. 4. Coronary artery disease.      CURRENT THERAPY:  Maintenance Herceptin and Perjeta (Phesgo) q3weeks starting 06/18/20, held 09/22/20-11/24/20  due to low EF Letrozole 2.$RemoveBefore'5mg'[id number]$  daily starting 06/19/23 Zometa starting 06/18/20  INTERVAL HISTORY:  Carolyn Stewart is here for a follow up of metastatic breast cancer. She was last seen by me on 10/22/20. She presents to the clinic alone. She reports feeling well overall. She denies any side effects from the letrozole. She notes her husband's job has changed management, which has affected her insurance. She notes it would have been been very expensive for her to get insurance through him. She notes she is back to working full time; she works in home health care.   All other systems were reviewed with the patient and are negative.  MEDICAL HISTORY:  Past Medical History:  Diagnosis Date   Cancer ([place]) 2021   Hypertension     SURGICAL HISTORY: Past Surgical History:  Procedure Laterality Date   left parotid tumor removal   2006   PORTACATH PLACEMENT Left 12/15/2019   Procedure: INSERTION PORT-A-CATH WITH ULTRASOUND GUIDANCE;  Surgeon: [name], MD;  Location: [facility];  Service: General;  Laterality: Left;   TONSILLECTOMY      I have reviewed the social history and family history with the patient and they are unchanged from previous note.  ALLERGIES:  has No Known Allergies.  MEDICATIONS:  Current Outpatient Medications  Medication Sig Dispense Refill   acetaminophen (TYLENOL) 500 MG tablet Take 1,000 mg by mouth every 6 (six) hours as needed for moderate pain or headache.     amLODipine (NORVASC) 10 MG tablet Take 1 tablet (10 mg total) by mouth daily. 90 tablet 0   Cholecalciferol (VITAMIN D3) 50 MCG (2000 UT) TABS Take 2,000 Units by mouth daily.      KLOR-CON M20 20 MEQ tablet TAKE 1 TABLET BY MOUTH EVERY DAY 90 tablet 1   letrozole (FEMARA) 2.5 MG tablet TAKE 1 TABLET BY MOUTH EVERY DAY 90 tablet 1   lidocaine-prilocaine (EMLA) cream Apply 1 application topically as needed. 30 g 1   losartan (COZAAR) 25 MG tablet Take 1 tablet (25 mg total) by mouth daily. 90 tablet 3    metoprolol succinate (TOPROL-XL) 50 MG 24 hr tablet Take 1 tablet (50 mg total) by mouth daily. 90 tablet 0   omeprazole (PRILOSEC OTC) 20 MG tablet Take 20 mg by mouth daily.     No current facility-administered medications for this visit.    PHYSICAL EXAMINATION: ECOG PERFORMANCE STATUS: 0 - Asymptomatic  Vitals:   11/25/20 1123  BP: 132/75  Pulse: 80  Resp: 17  Temp: 98.2 F (36.8 C)  SpO2: 100%   Wt Readings from Last 3 Encounters:  11/25/20 189 lb (85.7 kg)  10/22/20 186 lb 9.6 oz (84.6 kg)  10/21/20 188 lb 6.4 oz (85.5 kg)     GENERAL:alert, no distress and comfortable SKIN: skin color, texture, turgor are normal, no rashes or significant lesions EYES: normal, Conjunctiva are pink and non-injected, sclera clear  NECK: supple, thyroid normal size, non-tender, without nodularity LYMPH:  no palpable lymphadenopathy in the cervical, axillary  LUNGS: clear to auscultation and percussion with normal breathing effort HEART: regular rate & rhythm and no murmurs and no lower extremity edema ABDOMEN:abdomen soft, non-tender and normal bowel sounds Musculoskeletal:no cyanosis of digits and no clubbing  NEURO:  alert & oriented x 3 with fluent speech, no focal motor/sensory deficits  LABORATORY DATA:  I have reviewed the data as listed CBC Latest Ref Rng & Units 11/25/2020 10/22/2020 09/03/2020  WBC 4.0 - 10.5 K/uL 6.2 5.5 5.6  Hemoglobin 12.0 - 15.0 g/dL 12.0 12.9 12.6  Hematocrit 36.0 - 46.0 % 37.3 39.3 39.2  Platelets 150 - 400 K/uL 315 290 260     CMP Latest Ref Rng & Units 11/25/2020 10/22/2020 09/03/2020  Glucose 70 - 99 mg/dL 90 106(H) 99  BUN 6 - 20 mg/dL _0 Creatinine 0.44 - 1.00 mg/dL 0.79 0.84 0.77  Sodium 135 - 145 mmol/L 141 144 143  Potassium 3.5 - 5.1 mmol/L 3.8 3.6 3.4(L)  Chloride 98 - 111 mmol/L 107 109 107  CO2 22 - 32 mmol/L _1 Calcium 8.9 - 10.3 mg/dL 9.5 9.0 9.7  Total Protein 6.5 - 8.1 g/dL 7.1 7.2 6.8  Total Bilirubin 0.3 - 1.2 mg/dL 0.4 0.4  0.4  Alkaline Phos 38 - 126 U/L 79 84 83  AST 15 - 41 U/L 18 14(L) 14(L)  ALT 0 - 44 U/L 17 9 <6      RADIOGRAPHIC STUDIES: I have personally reviewed the radiological images as listed and agreed with the findings in the report. No results found.   ASSESSMENT & PLAN:  Carolyn Stewart is a 60 y.o. female with   1. Malignant neoplasm of upper-outer quadrant of right breast, ductal carcinoma, Stage IIA, c(T3N1Mx), with bone and possible liver mets,  ER+/PR+/HER2+, Grade II-III  -She was diagnosed in 11/2019 with a large right breast mass in 4 quadrants measuring up to 11cm and right lymphadenopathy. Her right breast and LN biopsy was positive for invasive ductal carcinoma with components of DCIS. Her left axillary node biopsy was benign.  -01/01/20 Liver biopsy was negative for malignant cells but her 01/12/20 Bone Biopsy was positive for metastatic carcinoma from primary breast cancer. Her ER/PR/HER2 were all negative bone biopsy, possibly related to decalcification. -She was also seen to have left breast mass on her 12/12/19 MRI -She received THP 01/03/20-05/27/20. Her restaging PET scan from 04/28/20 showed no hypermetabolic lesions. Given her excellent response, I switched to maintenance therapy with Herceptin, Perjeta (Phesgo) q3weeks and Letrozole starting 06/18/20.   -the goal of therapy is disease control, not cure -Zometa started 06/18/20, will continue every 3 months, but will hold it for now due to lack of insurance.  I encouraged her to keep up with dental care. She has no active dental issues. She is on calcium and VitD -Restaging CT and bone scan on 09/03/20 showed interval treatment response, both to osseous metastases and liver lesions. Will continue current maintenance therapy. Due  -Her HP was held due to decreased EF.  I reviewed his recent echocardiogram, which showed EF 55 to 60%, in the normal range, we will restart this tomorrow -labs reviewed, CBC AND CMP WNL, adequate for  treatment. -Continue PhosChol injection every 3 weeks, follow-up in 9 weeks -She recently lost insurance, we will postpone her restaging scan, since she is clinically doing well, and tumor mark in the normal range.  2. Chemotherapy-induced cardiomyopathy -echo 09/22/20 showed decreased EF -She was started on losartan by Dr. Haroldine Laws on 10/21/20 -repeat echo on 11/18/20 showed improved systolic function   3. HTN -Well controlled on Metoprolol and Amlodipine.  -She is without a PCP at the moment, so I refilled these for her today.  4. Covid-19 (+) 10/2020 -  tested positive 11/06/20 -She experienced cough, chest congestion, bad headaches, and night fevers -she was prescribed paxlovid -she has recovered well     PLAN:  -will restart Phesgo injection tomorrow with loading dose and continue injection every 3 weeks  -continue letrozole -labs, f/u, in 9 weeks    No problem-specific Assessment & Plan notes found for this encounter.   No orders of the defined types were placed in this encounter.  All questions were answered. The patient knows to call the clinic with any problems, questions or concerns. No barriers to learning was detected. The total time spent in the appointment was 30 minutes.     Truitt Merle, MD 11/25/2020   I, Wilburn Mylar, am acting as scribe for Truitt Merle, MD.   I have reviewed the above documentation for accuracy and completeness, and I agree with the above.

## 2020-11-25 NOTE — Progress Notes (Signed)
Last dose 5/19. Per Dr. Burr Medico, will reload for dose on 8/5.

## 2020-11-26 ENCOUNTER — Inpatient Hospital Stay: Payer: Commercial Managed Care - PPO

## 2020-11-26 VITALS — BP 117/76 | HR 78 | Temp 98.1°F | Resp 18

## 2020-11-26 DIAGNOSIS — Z5111 Encounter for antineoplastic chemotherapy: Secondary | ICD-10-CM | POA: Diagnosis not present

## 2020-11-26 DIAGNOSIS — C50411 Malignant neoplasm of upper-outer quadrant of right female breast: Secondary | ICD-10-CM

## 2020-11-26 LAB — CANCER ANTIGEN 27.29: CA 27.29: 31.4 U/mL (ref 0.0–38.6)

## 2020-11-26 MED ORDER — ACETAMINOPHEN 325 MG PO TABS
650.0000 mg | ORAL_TABLET | Freq: Once | ORAL | Status: AC
  Administered 2020-11-26: 650 mg via ORAL
  Filled 2020-11-26: qty 2

## 2020-11-26 MED ORDER — SODIUM CHLORIDE 0.9 % IV SOLN
Freq: Once | INTRAVENOUS | Status: DC
  Filled 2020-11-26: qty 250

## 2020-11-26 MED ORDER — PERTUZ-TRASTUZ-HYALURON-ZZXF 80-40-2000 MG-MG-U/ML CHEMO ~~LOC~~ SOLN
15.0000 mL | Freq: Once | SUBCUTANEOUS | Status: AC
  Administered 2020-11-26: 15 mL via SUBCUTANEOUS
  Filled 2020-11-26: qty 15

## 2020-11-26 MED ORDER — DIPHENHYDRAMINE HCL 25 MG PO CAPS
50.0000 mg | ORAL_CAPSULE | Freq: Once | ORAL | Status: AC
  Administered 2020-11-26: 50 mg via ORAL
  Filled 2020-11-26: qty 2

## 2020-11-26 NOTE — Progress Notes (Signed)
Patient monitored for 30 minutes post injection. Patient tolerated injection well.  VSS upon leaving infusion room.

## 2020-11-26 NOTE — Patient Instructions (Signed)
[facility]  Discharge Instructions: Thank you for choosing [facility] to provide your oncology and hematology care.   If you have a lab appointment with the [facility], please go directly to the [facility] and check in at the registration area.   Wear comfortable clothing and clothing appropriate for easy access to any Portacath or PICC line.   We strive to give you quality time with your provider. You may need to reschedule your appointment if you arrive late (15 or more minutes).  Arriving late affects you and other patients whose appointments are after yours.  Also, if you miss three or more appointments without notifying the office, you may be dismissed from the clinic at the provider's discretion.      For prescription refill requests, have your pharmacy contact our office and allow 72 hours for refills to be completed.    Today you received the following chemotherapy and/or immunotherapy agents Phesgo    To help prevent nausea and vomiting after your treatment, we encourage you to take your nausea medication as directed.  BELOW ARE SYMPTOMS THAT SHOULD BE REPORTED IMMEDIATELY: *FEVER GREATER THAN 100.4 F (38 C) OR HIGHER *CHILLS OR SWEATING *NAUSEA AND VOMITING THAT IS NOT CONTROLLED WITH YOUR NAUSEA MEDICATION *UNUSUAL SHORTNESS OF BREATH *UNUSUAL BRUISING OR BLEEDING *URINARY PROBLEMS (pain or burning when urinating, or frequent urination) *BOWEL PROBLEMS (unusual diarrhea, constipation, pain near the anus) TENDERNESS IN MOUTH AND THROAT WITH OR WITHOUT PRESENCE OF ULCERS (sore throat, sores in mouth, or a toothache) UNUSUAL RASH, SWELLING OR PAIN  UNUSUAL VAGINAL DISCHARGE OR ITCHING   Items with * indicate a potential emergency and should be followed up as soon as possible or go to the Emergency Department if any problems should occur.  Please show the CHEMOTHERAPY ALERT CARD or IMMUNOTHERAPY ALERT CARD at check-in to the  Emergency Department and triage nurse.  Should you have questions after your visit or need to cancel or reschedule your appointment, please contact [facility]  Dept: [phone]  and follow the prompts.  Office hours are 8:00 a.m. to 4:30 p.m. Monday - Friday. Please note that voicemails left after 4:00 p.m. may not be returned until the following business day.  We are closed weekends and major holidays. You have access to a nurse at all times for urgent questions. Please call the main number to the clinic Dept: [phone] and follow the prompts.   For any non-urgent questions, you may also contact your provider using MyChart. We now offer e-Visits for anyone 48 and older to request care online for non-urgent symptoms. For details visit [email or web address].   Also download the MyChart app! Go to the app store, search "MyChart", open the app, select [facility], and log in with your MyChart username and password.  Due to Covid, a mask is required upon entering the hospital/clinic. If you do not have a mask, one will be given to you upon arrival. For doctor visits, patients may have 1 support person aged 53 or older with them. For treatment visits, patients cannot have anyone with them due to current Covid guidelines and our immunocompromised population.

## 2020-11-29 ENCOUNTER — Other Ambulatory Visit (HOSPITAL_COMMUNITY): Payer: Self-pay | Admitting: *Deleted

## 2020-11-29 DIAGNOSIS — I427 Cardiomyopathy due to drug and external agent: Secondary | ICD-10-CM

## 2020-12-16 ENCOUNTER — Telehealth: Payer: Self-pay

## 2020-12-16 NOTE — Telephone Encounter (Signed)
Patient called in and wanted to verify that she is only receiving an injection at her appointment for tomorrow. States that My Chart shows that she is getting a 2 hour infusion.  This nurse reassured patient that she will only be getting an injection informed that it is the way we have to schedule that particular injection.  No further questions or concerns noted at this time.

## 2020-12-17 ENCOUNTER — Ambulatory Visit: Payer: Commercial Managed Care - PPO

## 2020-12-17 ENCOUNTER — Inpatient Hospital Stay: Payer: Commercial Managed Care - PPO

## 2020-12-17 ENCOUNTER — Other Ambulatory Visit: Payer: Self-pay

## 2020-12-17 VITALS — BP 139/85 | HR 93 | Temp 98.3°F | Resp 20 | Ht 67.0 in | Wt 191.0 lb

## 2020-12-17 DIAGNOSIS — Z170 Estrogen receptor positive status [ER+]: Secondary | ICD-10-CM

## 2020-12-17 DIAGNOSIS — Z5111 Encounter for antineoplastic chemotherapy: Secondary | ICD-10-CM | POA: Diagnosis not present

## 2020-12-17 DIAGNOSIS — C50411 Malignant neoplasm of upper-outer quadrant of right female breast: Secondary | ICD-10-CM

## 2020-12-17 MED ORDER — ACETAMINOPHEN 325 MG PO TABS
650.0000 mg | ORAL_TABLET | Freq: Once | ORAL | Status: AC
  Administered 2020-12-17: 650 mg via ORAL
  Filled 2020-12-17: qty 2

## 2020-12-17 MED ORDER — DIPHENHYDRAMINE HCL 25 MG PO CAPS
50.0000 mg | ORAL_CAPSULE | Freq: Once | ORAL | Status: AC
  Administered 2020-12-17: 50 mg via ORAL
  Filled 2020-12-17: qty 2

## 2020-12-17 MED ORDER — PERTUZ-TRASTUZ-HYALURON-ZZXF 60-60-2000 MG-MG-U/ML CHEMO ~~LOC~~ SOLN
10.0000 mL | Freq: Once | SUBCUTANEOUS | Status: AC
  Administered 2020-12-17: 10 mL via SUBCUTANEOUS
  Filled 2020-12-17: qty 10

## 2020-12-17 NOTE — Patient Instructions (Signed)
Carolyn Stewart   Discharge Instructions: Thank you for choosing [facility] to provide your oncology and hematology care.   If you have a lab appointment with the [facility], please go directly to the [facility] and check in at the registration area.   Wear comfortable clothing and clothing appropriate for easy access to any Portacath or PICC line.   We strive to give you quality time with your provider. You may need to reschedule your appointment if you arrive late (15 or more minutes).  Arriving late affects you and other patients whose appointments are after yours.  Also, if you miss three or more appointments without notifying the office, you may be dismissed from the clinic at the provider's discretion.      For prescription refill requests, have your pharmacy contact our office and allow 72 hours for refills to be completed.    Today you received the following chemotherapy and/or immunotherapy agents Pertuzumab; Trastuzumab; Hyaluronidase Injection (PHESGO).      To help prevent nausea and vomiting after your treatment, we encourage you to take your nausea medication as directed.  BELOW ARE SYMPTOMS THAT SHOULD BE REPORTED IMMEDIATELY: *FEVER GREATER THAN 100.4 F (38 C) OR HIGHER *CHILLS OR SWEATING *NAUSEA AND VOMITING THAT IS NOT CONTROLLED WITH YOUR NAUSEA MEDICATION *UNUSUAL SHORTNESS OF BREATH *UNUSUAL BRUISING OR BLEEDING *URINARY PROBLEMS (pain or burning when urinating, or frequent urination) *BOWEL PROBLEMS (unusual diarrhea, constipation, pain near the anus) TENDERNESS IN MOUTH AND THROAT WITH OR WITHOUT PRESENCE OF ULCERS (sore throat, sores in mouth, or a toothache) UNUSUAL RASH, SWELLING OR PAIN  UNUSUAL VAGINAL DISCHARGE OR ITCHING   Items with * indicate a potential emergency and should be followed up as soon as possible or go to the Emergency Department if any problems should occur.  Please show the CHEMOTHERAPY ALERT  CARD or IMMUNOTHERAPY ALERT CARD at check-in to the Emergency Department and triage nurse.  Should you have questions after your visit or need to cancel or reschedule your appointment, please contact [facility]  Dept: [phone]  and follow the prompts.  Office hours are 8:00 a.m. to 4:30 p.m. Monday - Friday. Please note that voicemails left after 4:00 p.m. may not be returned until the following business day.  We are closed weekends and major holidays. You have access to a nurse at all times for urgent questions. Please call the main number to the clinic Dept: [phone] and follow the prompts.   For any non-urgent questions, you may also contact your provider using MyChart. We now offer e-Visits for anyone 13 and older to request care online for non-urgent symptoms. For details visit [email or web address].   Also download the MyChart app! Go to the app store, search "MyChart", open the app, select [facility], and log in with your MyChart username and password.  Due to Covid, a mask is required upon entering the hospital/clinic. If you do not have a mask, one will be given to you upon arrival. For doctor visits, patients may have 1 support person aged 51 or older with them. For treatment visits, patients cannot have anyone with them due to current Covid guidelines and our immunocompromised population.   Pertuzumab; Trastuzumab; Hyaluronidase Injection What is this medication? PERTUZUMAB (per TOOZ ue mab); TRASTUZUMAB (tras TOO zoo mab); HYALURONIDASE (hye al ur ON i dase) is used to treat breast cancer. Pertuzumab and trastuzumab are a monoclonal antibodies. Hyaluronidase is used to improve the  effects of pertuzumab and trastuzumab. This medicine may be used for other purposes; ask your health care provider or pharmacist if you have questions. COMMON BRAND NAME(S): PHESGO What should I tell my care team before I take this medication? They need to know if you have  any of these conditions: heart disease high blood pressure history of irregular heartbeat lung or breathing disease, like asthma an unusual or allergic reaction to pertuzumab, trastuzumab, hyaluronidase, other medicines, foods, dyes, or preservatives pregnant or trying to get pregnant breast-feeding How should I use this medication? This medicine is for injection under the skin. It may be given by a health care professional in a hospital or clinic setting or a health care professional may give you this medicine at home. Talk to your pediatrician regarding the use of this medicine in children. Special care may be needed. Overdosage: If you think you have taken too much of this medicine contact a poison control center or emergency room at once. NOTE: This medicine is only for you. Do not share this medicine with others. What if I miss a dose? It is important not to miss your dose. Call your doctor or health care professional if you are unable to keep an appointment. What may interact with this medication? This medicine may interact with the following medications: certain types of chemotherapy, such as daunorubicin, doxorubicin, epirubicin, and idarubicin This list may not describe all possible interactions. Give your health care provider a list of all the medicines, herbs, non-prescription drugs, or dietary supplements you use. Also tell them if you smoke, drink alcohol, or use illegal drugs. Some items may interact with your medicine. What should I watch for while using this medication? Visit your health care professional for regular checks on your progress. Tell your health care professional if your symptoms do not start to get better or if they get worse. Your condition will be monitored carefully while you are receiving this medicine. Do not become pregnant while taking this medicine or for 7 months after stopping it. Women should inform their doctor if they wish to become pregnant or think  they might be pregnant. There is a potential for serious side effects to an unborn child. Talk to your health care professional or pharmacist for more information. What side effects may I notice from receiving this medication? Side effects that you should report to your doctor or health care professional as soon as possible: allergic reactions like skin rash, itching or hives, swelling of the face, lips, or tongue breathing problems chest pain cough dizziness feeling faint; lightheaded, falls fever or chills loss of consciousness pain, tingling, numbness in the hands or feet palpitations sudden weight gain swelling of the ankles or legs unusually weak or tired Side effects that usually do not require medical attention (report these to your doctor or health care professional if they continue or are bothersome): diarrhea hair loss joint pain muscle pain nausea, vomiting pain, redness, or irritation at site where injected tiredness This list may not describe all possible side effects. Call your doctor for medical advice about side effects. You may report side effects to FDA at 1-800-FDA-1088. Where should I keep my medication? Keep out of the reach of children. If you are using this medicine at home, you will be instructed on how to store it. Throw away any unused medicine after the expiration date on the label. NOTE: This sheet is a summary. It may not cover all possible information. If you have questions about this  medicine, talk to your doctor, pharmacist, or health care provider.  2022 Elsevier/Gold Standard (2019-06-17 12:39:07)

## 2021-01-05 ENCOUNTER — Ambulatory Visit (HOSPITAL_BASED_OUTPATIENT_CLINIC_OR_DEPARTMENT_OTHER)
Admission: RE | Admit: 2021-01-05 | Discharge: 2021-01-05 | Disposition: A | Discharge status: Home / Self Care | Payer: Commercial Managed Care - PPO | Source: Ambulatory Visit | Attending: Internal Medicine | Admitting: Internal Medicine

## 2021-01-05 ENCOUNTER — Other Ambulatory Visit: Payer: Self-pay

## 2021-01-05 ENCOUNTER — Ambulatory Visit (HOSPITAL_COMMUNITY)
Admission: RE | Admit: 2021-01-05 | Discharge: 2021-01-05 | Disposition: A | Discharge status: Home / Self Care | Payer: Commercial Managed Care - PPO | Source: Ambulatory Visit | Attending: Internal Medicine | Admitting: Internal Medicine

## 2021-01-05 ENCOUNTER — Encounter (HOSPITAL_COMMUNITY): Payer: Self-pay | Admitting: Internal Medicine

## 2021-01-05 VITALS — BP 160/90 | HR 77 | Wt 192.4 lb

## 2021-01-05 DIAGNOSIS — I351 Nonrheumatic aortic (valve) insufficiency: Secondary | ICD-10-CM | POA: Diagnosis not present

## 2021-01-05 DIAGNOSIS — I427 Cardiomyopathy due to drug and external agent: Secondary | ICD-10-CM | POA: Diagnosis not present

## 2021-01-05 DIAGNOSIS — Z170 Estrogen receptor positive status [ER+]: Secondary | ICD-10-CM

## 2021-01-05 DIAGNOSIS — C50411 Malignant neoplasm of upper-outer quadrant of right female breast: Secondary | ICD-10-CM

## 2021-01-05 DIAGNOSIS — Z0189 Encounter for other specified special examinations: Secondary | ICD-10-CM | POA: Diagnosis not present

## 2021-01-05 DIAGNOSIS — C50919 Malignant neoplasm of unspecified site of unspecified female breast: Secondary | ICD-10-CM

## 2021-01-05 DIAGNOSIS — T451X5A Adverse effect of antineoplastic and immunosuppressive drugs, initial encounter: Secondary | ICD-10-CM

## 2021-01-05 DIAGNOSIS — I10 Essential (primary) hypertension: Secondary | ICD-10-CM

## 2021-01-05 LAB — ECHOCARDIOGRAM COMPLETE
Area-P 1/2: 3.77 cm2
S' Lateral: 3.4 cm
Single Plane A4C EF: 45.6 %

## 2021-01-05 MED ORDER — METOPROLOL SUCCINATE ER 50 MG PO TB24: 50.0000 mg | ORAL_TABLET | Freq: Every day | ORAL | 3 refills | Status: DC

## 2021-01-05 MED ORDER — LOSARTAN POTASSIUM 50 MG PO TABS: 50.0000 mg | ORAL_TABLET | Freq: Every day | ORAL | 3 refills | Status: DC

## 2021-01-05 MED ORDER — AMLODIPINE BESYLATE 10 MG PO TABS: 10.0000 mg | ORAL_TABLET | Freq: Every day | ORAL | 3 refills | Status: DC

## 2021-01-05 NOTE — Patient Instructions (Signed)
A refill have been sent to your pharmacy for Amlodipine and Metoprolol  INCREASE Losartan to '50mg'$  (1 tab) daily  Labs 2 weeks We will only contact you if something comes back abnormal or we need to make some changes. Otherwise no news is good news!  Your physician has requested that you have an echocardiogram. Echocardiography is a painless test that uses sound waves to create images of your heart. It provides your doctor with information about the size and shape of your heart and how well your heart's chambers and valves are working. This procedure takes approximately one hour. There are no restrictions for this procedure.  Your physician recommends that you schedule a follow-up appointment in: 2 months with Dr Haroldine Laws  Please call office at [phone] option 2 if you have any questions or concerns.   At the [facility], you and your health needs are our priority. As part of our continuing mission to provide you with exceptional heart care, we have created designated Provider Care Teams. These Care Teams include your primary Cardiologist (physician) and Advanced Practice Providers (APPs- Physician Assistants and Nurse Practitioners) who all work together to provide you with the care you need, when you need it.   You may see any of the following providers on your designated Care Team at your next follow up: Dr Glori Bickers Dr Loralie Champagne Dr Patrice Paradise, NP Lyda Jester, [place] Ginnie Smart Audry Riles, PharmD   Please be sure to bring in all your medications bottles to every appointment.

## 2021-01-05 NOTE — Progress Notes (Signed)
  Echocardiogram 2D Echocardiogram has been performed.  Carolyn Stewart 01/05/2021, 2:38 PM

## 2021-01-05 NOTE — Progress Notes (Signed)
CARDIO-ONCOLOGY NOTE  Referring Physician: Dr. Burr Medico Primary Care: None HF Cardiologist: Dr. Haroldine Laws  HPI:  Carolyn Stewart is 60 y.o. female with HTN and stage IV right breast cancer referred by Dr. Burr Medico for enrollment into the Cardio-Oncology program due to reduced EF on echo.   Diagnosed with R breast cancer in 8/21 (triple positive) found to have mets to bone. She received THP 01/03/20-05/27/20. Her restaging PET scan from 04/28/20 showed no hypermetabolic lesions. Given her excellent response, she was switched to maintenance therapy with Herceptin, Perjeta q3weeks and Letrozole starting 06/18/20 which is still ongoing.  Zometa started 06/18/20  In June had echo which showed EF 50-55% so H/P held. Losartan started.   Had covid in July. Received Paxlovid and recovered without need for hospital admission.  Echo 07/22 with EF 55-60%. Herceptin restarted.  Echo today with EF 55% Personally reviewed  She is here today for 3 month follow-up. Feeling well.  No complaints. Denies CP, SOB, LE edema. Taking all medications  ECHO 07/22: EF 55-60% GLS 16 ECHO  09/22/20: EF 40-45%  (I felt 50-55%) ECHO 12/21: EF 60-65% GLS -18.2 ECHO 8/21: EF 60-65% G1DD   Review of Systems: Negative except as mentioned in HPI  Past Medical History:  Diagnosis Date   Cancer ([facility]) 2021   Hypertension     Current Outpatient Medications  Medication Sig Dispense Refill   acetaminophen (TYLENOL) 500 MG tablet Take 1,000 mg by mouth every 6 (six) hours as needed for moderate pain or headache.     Cholecalciferol (VITAMIN D3) 50 MCG (2000 UT) TABS Take 2,000 Units by mouth daily.      KLOR-CON M20 20 MEQ tablet TAKE 1 TABLET BY MOUTH EVERY DAY 90 tablet 1   letrozole (FEMARA) 2.5 MG tablet TAKE 1 TABLET BY MOUTH EVERY DAY 90 tablet 1   lidocaine-prilocaine (EMLA) cream Apply 1 application topically as needed. 30 g 1   omeprazole (PRILOSEC OTC) 20 MG tablet Take 20 mg by mouth daily.     amLODipine (NORVASC) 10 MG  tablet Take 1 tablet (10 mg total) by mouth daily. 90 tablet 3   losartan (COZAAR) 50 MG tablet Take 1 tablet (50 mg total) by mouth daily. 90 tablet 3   metoprolol succinate (TOPROL-XL) 50 MG 24 hr tablet Take 1 tablet (50 mg total) by mouth daily. 90 tablet 3   No current facility-administered medications for this encounter.    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   Tobacco Use   Smoking status: Never   Smokeless tobacco: Never  Vaping Use   Vaping Use: Never used  Substance and Sexual Activity   Alcohol use: Yes    Comment: social drinker    Drug use: No   Sexual activity: Yes  Other Topics Concern   Not on file  Social History Narrative   Not on file   Social Determinants of Health   Financial Resource Strain: Not on file  Food Insecurity: Not on file  Transportation Needs: Not on file  Physical Activity: Not on file  Stress: Not on file  Social Connections: Not on file  Intimate Partner Violence: Not on file      Family History  Problem Relation Age of Onset   Hypertension Mother    Hypertension Father     Vitals:   01/05/21 1447  BP: (!) 160/90  Pulse: 77  SpO2: 98%  Weight: 87.3 kg (192 lb 6.4 oz)     PHYSICAL EXAM: General:  Well appearing AAF. No resp difficulty HEENT: normal Neck: supple. no JVD. Carotids 2+ bilat; no bruits. No lymphadenopathy or thryomegaly appreciated. Cor: PMI nondisplaced. Regular rate & rhythm. No rubs, gallops or murmurs. Lungs: clear Abdomen: soft, nontender, nondistended. No hepatosplenomegaly. No bruits or masses. Good bowel sounds. Extremities: no cyanosis, clubbing, rash, edema Neuro: alert & orientedx3, cranial nerves grossly intact. moves all 4 extremities w/o difficulty. Affect  pleasant    ASSESSMENT & PLAN: 1.  Chemotherapy-induced CM - ECHO 8/21: EF 60-65% G1DD - ECHO 12/21: EF 60-65% GLS -18.2 - ECHO  09/22/20: EF 40-45%  (I felt 50-55%) - ECHO 07/22: EF 55-60% GLS 16 - ECHO today with EF 55%. LV function appears less brisk than on prior echo in July. Dr. Haroldine Laws discussed with Dr. Burr Medico. Will continue herceptin q 3 weeks for now and recheck echo in 2 months. - Herceptin held in June d/t mild cardiomyopathy. Restarted in August. - Continue metoprolol. Increase losartan to 50 mg daily. BMET in 2 weeks.  2. Stage IV Right Breast Cancer (triple positive) - Diagnosed with R breast cancer in 8/21 (triple positive) found to have mets to bone. She received THP 01/03/20-05/27/20. Her restaging PET scan from 04/28/20 showed no hypermetabolic lesions. Given her excellent response, she was switched to maintenance therapy with Herceptin, Perjeta q3weeks and Letrozole starting 06/18/20.  Zometa started 06/18/20, will continue every 3 months. - Herceptin held 09/22/20 due to reduced EF, restarted in August as above. Echo in 2 months as above.  3. HTN - BP elevated in clinic - Home readings controlled.  - Increased losartan as above d/t cardiomyopathy   F/u 2 months with echo   FINCH, LINDSAY N, PA-C  3:28 PM  Patient seen and examined with the above-signed Advanced Practice Provider and/or Housestaff. I personally reviewed laboratory data, imaging studies and relevant notes. I independently examined the patient and formulated the important aspects of the plan. I have edited the note to reflect any of my changes or salient points. I have personally discussed the plan with the patient and/or family.  Overall feeling good. Denies CP, SOB, orthopnea, PND or edema. Remains on Herceptin for Stage IV breast CA.  Echo as above.   General:  Well appearing. No resp difficulty HEENT: normal Neck: supple. no JVD. Carotids 2+ bilat; no bruits. No lymphadenopathy or thryomegaly  appreciated. Cor: PMI nondisplaced. Regular rate & rhythm. No rubs, gallops or murmurs. Lungs: clear Abdomen: soft, nontender, nondistended. No hepatosplenomegaly. No bruits or masses. Good bowel sounds. Extremities: no cyanosis, clubbing, rash, edema Neuro: alert & orientedx3, cranial nerves grossly intact. moves all 4 extremities w/o difficulty. Affect pleasant  On echo today EF is in normal range bur seems to be drifting back down - concerning for Herceptin cardiotoxicity. I discussed with Dr. Annamaria Boots. As she has not attained complete remission yet, she does not feel comfortable cutting Herceptin back unless absolutely necessary. Will continue Herceptin and repeat echo in 2 months. BP elevated. Will increase losartan. Continue Toprol.    Glori Bickers, MD  10:08 PM

## 2021-01-07 ENCOUNTER — Inpatient Hospital Stay: Payer: Commercial Managed Care - PPO | Attending: Hematology

## 2021-01-07 ENCOUNTER — Inpatient Hospital Stay: Payer: Commercial Managed Care - PPO

## 2021-01-07 ENCOUNTER — Ambulatory Visit: Payer: Commercial Managed Care - PPO

## 2021-01-07 ENCOUNTER — Other Ambulatory Visit: Payer: Self-pay

## 2021-01-07 VITALS — BP 120/75 | HR 79 | Temp 98.0°F | Resp 18

## 2021-01-07 DIAGNOSIS — C50411 Malignant neoplasm of upper-outer quadrant of right female breast: Secondary | ICD-10-CM | POA: Insufficient documentation

## 2021-01-07 DIAGNOSIS — C7951 Secondary malignant neoplasm of bone: Secondary | ICD-10-CM | POA: Insufficient documentation

## 2021-01-07 DIAGNOSIS — Z170 Estrogen receptor positive status [ER+]: Secondary | ICD-10-CM | POA: Insufficient documentation

## 2021-01-07 DIAGNOSIS — Z5111 Encounter for antineoplastic chemotherapy: Secondary | ICD-10-CM | POA: Insufficient documentation

## 2021-01-07 MED ORDER — DIPHENHYDRAMINE HCL 25 MG PO CAPS
50.0000 mg | ORAL_CAPSULE | Freq: Once | ORAL | Status: AC
  Administered 2021-01-07: 50 mg via ORAL
  Filled 2021-01-07: qty 2

## 2021-01-07 MED ORDER — ACETAMINOPHEN 325 MG PO TABS
650.0000 mg | ORAL_TABLET | Freq: Once | ORAL | Status: AC
  Administered 2021-01-07: 650 mg via ORAL
  Filled 2021-01-07: qty 2

## 2021-01-07 MED ORDER — PERTUZ-TRASTUZ-HYALURON-ZZXF 60-60-2000 MG-MG-U/ML CHEMO ~~LOC~~ SOLN
10.0000 mL | Freq: Once | SUBCUTANEOUS | Status: AC
Start: 2021-01-07 — End: 2021-01-07
  Administered 2021-01-07: 10 mL via SUBCUTANEOUS
  Filled 2021-01-07: qty 10

## 2021-01-07 NOTE — Progress Notes (Signed)
Patient monitored for 15 minutes post injection.  Patient tolerated well with no complaints.  VSS upon leaving infusion room.

## 2021-01-07 NOTE — Patient Instructions (Signed)
[facility]   Discharge Instructions: Thank you for choosing [facility] to provide your oncology and hematology care.   If you have a lab appointment with the [facility], please go directly to the [facility] and check in at the registration area.   Wear comfortable clothing and clothing appropriate for easy access to any Portacath or PICC line.   We strive to give you quality time with your provider. You may need to reschedule your appointment if you arrive late (15 or more minutes).  Arriving late affects you and other patients whose appointments are after yours.  Also, if you miss three or more appointments without notifying the office, you may be dismissed from the clinic at the provider's discretion.      For prescription refill requests, have your pharmacy contact our office and allow 72 hours for refills to be completed.    Today you received the following chemotherapy and/or immunotherapy agents Pertuzumab; Trastuzumab; Hyaluronidase Injection (PHESGO).      To help prevent nausea and vomiting after your treatment, we encourage you to take your nausea medication as directed.  BELOW ARE SYMPTOMS THAT SHOULD BE REPORTED IMMEDIATELY: *FEVER GREATER THAN 100.4 F (38 C) OR HIGHER *CHILLS OR SWEATING *NAUSEA AND VOMITING THAT IS NOT CONTROLLED WITH YOUR NAUSEA MEDICATION *UNUSUAL SHORTNESS OF BREATH *UNUSUAL BRUISING OR BLEEDING *URINARY PROBLEMS (pain or burning when urinating, or frequent urination) *BOWEL PROBLEMS (unusual diarrhea, constipation, pain near the anus) TENDERNESS IN MOUTH AND THROAT WITH OR WITHOUT PRESENCE OF ULCERS (sore throat, sores in mouth, or a toothache) UNUSUAL RASH, SWELLING OR PAIN  UNUSUAL VAGINAL DISCHARGE OR ITCHING   Items with * indicate a potential emergency and should be followed up as soon as possible or go to the Emergency Department if any problems should occur.  Please show the CHEMOTHERAPY ALERT  CARD or IMMUNOTHERAPY ALERT CARD at check-in to the Emergency Department and triage nurse.  Should you have questions after your visit or need to cancel or reschedule your appointment, please contact [facility]  Dept: [phone]  and follow the prompts.  Office hours are 8:00 a.m. to 4:30 p.m. Monday - Friday. Please note that voicemails left after 4:00 p.m. may not be returned until the following business day.  We are closed weekends and major holidays. You have access to a nurse at all times for urgent questions. Please call the main number to the clinic Dept: [phone] and follow the prompts.   For any non-urgent questions, you may also contact your provider using MyChart. We now offer e-Visits for anyone 64 and older to request care online for non-urgent symptoms. For details visit [email or web address].   Also download the MyChart app! Go to the app store, search "MyChart", open the app, select [facility], and log in with your MyChart username and password.  Due to Covid, a mask is required upon entering the hospital/clinic. If you do not have a mask, one will be given to you upon arrival. For doctor visits, patients may have 1 support person aged 72 or older with them. For treatment visits, patients cannot have anyone with them due to current Covid guidelines and our immunocompromised population.   Pertuzumab; Trastuzumab; Hyaluronidase Injection What is this medication? PERTUZUMAB (per TOOZ ue mab); TRASTUZUMAB (tras TOO zoo mab); HYALURONIDASE (hye al ur ON i dase) is used to treat breast cancer. Pertuzumab and trastuzumab are a monoclonal antibodies. Hyaluronidase is used to improve the  effects of pertuzumab and trastuzumab. This medicine may be used for other purposes; ask your health care provider or pharmacist if you have questions. COMMON BRAND NAME(S): PHESGO What should I tell my care team before I take this medication? They need to know if you have  any of these conditions: heart disease high blood pressure history of irregular heartbeat lung or breathing disease, like asthma an unusual or allergic reaction to pertuzumab, trastuzumab, hyaluronidase, other medicines, foods, dyes, or preservatives pregnant or trying to get pregnant breast-feeding How should I use this medication? This medicine is for injection under the skin. It may be given by a health care professional in a hospital or clinic setting or a health care professional may give you this medicine at home. Talk to your pediatrician regarding the use of this medicine in children. Special care may be needed. Overdosage: If you think you have taken too much of this medicine contact a poison control center or emergency room at once. NOTE: This medicine is only for you. Do not share this medicine with others. What if I miss a dose? It is important not to miss your dose. Call your doctor or health care professional if you are unable to keep an appointment. What may interact with this medication? This medicine may interact with the following medications: certain types of chemotherapy, such as daunorubicin, doxorubicin, epirubicin, and idarubicin This list may not describe all possible interactions. Give your health care provider a list of all the medicines, herbs, non-prescription drugs, or dietary supplements you use. Also tell them if you smoke, drink alcohol, or use illegal drugs. Some items may interact with your medicine. What should I watch for while using this medication? Visit your health care professional for regular checks on your progress. Tell your health care professional if your symptoms do not start to get better or if they get worse. Your condition will be monitored carefully while you are receiving this medicine. Do not become pregnant while taking this medicine or for 7 months after stopping it. Women should inform their doctor if they wish to become pregnant or think  they might be pregnant. There is a potential for serious side effects to an unborn child. Talk to your health care professional or pharmacist for more information. What side effects may I notice from receiving this medication? Side effects that you should report to your doctor or health care professional as soon as possible: allergic reactions like skin rash, itching or hives, swelling of the face, lips, or tongue breathing problems chest pain cough dizziness feeling faint; lightheaded, falls fever or chills loss of consciousness pain, tingling, numbness in the hands or feet palpitations sudden weight gain swelling of the ankles or legs unusually weak or tired Side effects that usually do not require medical attention (report these to your doctor or health care professional if they continue or are bothersome): diarrhea hair loss joint pain muscle pain nausea, vomiting pain, redness, or irritation at site where injected tiredness This list may not describe all possible side effects. Call your doctor for medical advice about side effects. You may report side effects to FDA at 1-800-FDA-1088. Where should I keep my medication? Keep out of the reach of children. If you are using this medicine at home, you will be instructed on how to store it. Throw away any unused medicine after the expiration date on the label. NOTE: This sheet is a summary. It may not cover all possible information. If you have questions about this  medicine, talk to your doctor, pharmacist, or health care provider.  2022 Elsevier/Gold Standard (2019-06-17 12:39:07)

## 2021-01-21 ENCOUNTER — Encounter: Payer: Self-pay | Admitting: Hematology

## 2021-01-25 ENCOUNTER — Other Ambulatory Visit (HOSPITAL_COMMUNITY): Payer: Commercial Managed Care - PPO

## 2021-01-28 ENCOUNTER — Telehealth: Payer: Self-pay | Admitting: Hematology

## 2021-01-28 ENCOUNTER — Inpatient Hospital Stay: Payer: Self-pay

## 2021-01-28 ENCOUNTER — Other Ambulatory Visit: Payer: Self-pay

## 2021-01-28 ENCOUNTER — Inpatient Hospital Stay: Payer: Self-pay | Attending: Hematology | Admitting: Hematology

## 2021-01-28 ENCOUNTER — Encounter: Payer: Self-pay | Admitting: Hematology

## 2021-01-28 VITALS — BP 144/81 | HR 75 | Temp 98.0°F | Resp 18 | Ht 67.0 in | Wt 195.3 lb

## 2021-01-28 DIAGNOSIS — C50411 Malignant neoplasm of upper-outer quadrant of right female breast: Secondary | ICD-10-CM

## 2021-01-28 DIAGNOSIS — Z170 Estrogen receptor positive status [ER+]: Secondary | ICD-10-CM

## 2021-01-28 DIAGNOSIS — Z5111 Encounter for antineoplastic chemotherapy: Secondary | ICD-10-CM | POA: Insufficient documentation

## 2021-01-28 DIAGNOSIS — C7951 Secondary malignant neoplasm of bone: Secondary | ICD-10-CM | POA: Insufficient documentation

## 2021-01-28 LAB — CMP (CANCER CENTER ONLY)
ALT: 7 U/L (ref 0–44)
AST: 13 U/L — ABNORMAL LOW (ref 15–41)
Albumin: 3.9 g/dL (ref 3.5–5.0)
Alkaline Phosphatase: 82 U/L (ref 38–126)
Anion gap: 6 (ref 5–15)
BUN: 12 mg/dL (ref 6–20)
CO2: 26 mmol/L (ref 22–32)
Calcium: 9.1 mg/dL (ref 8.9–10.3)
Chloride: 110 mmol/L (ref 98–111)
Creatinine: 0.84 mg/dL (ref 0.44–1.00)
GFR, Estimated: >60 mL/min (ref >60)
Glucose, Bld: 108 mg/dL — ABNORMAL HIGH (ref 70–99)
Potassium: 3.4 mmol/L — ABNORMAL LOW (ref 3.5–5.1)
Sodium: 142 mmol/L (ref 135–145)
Total Bilirubin: 0.4 mg/dL (ref 0.3–1.2)
Total Protein: 6.9 g/dL (ref 6.5–8.1)

## 2021-01-28 LAB — CBC WITH DIFFERENTIAL (CANCER CENTER ONLY)
Abs Immature Granulocytes: 0.01 10*3/uL (ref 0.00–0.07)
Basophils Absolute: 0 10*3/uL (ref 0.0–0.1)
Basophils Relative: 0 %
Eosinophils Absolute: 0.2 10*3/uL (ref 0.0–0.5)
Eosinophils Relative: 3 %
HCT: 38.3 % (ref 36.0–46.0)
Hemoglobin: 12.5 g/dL (ref 12.0–15.0)
Immature Granulocytes: 0 %
Lymphocytes Relative: 49 %
Lymphs Abs: 2.5 10*3/uL (ref 0.7–4.0)
MCH: 29.8 pg (ref 26.0–34.0)
MCHC: 32.6 g/dL (ref 30.0–36.0)
MCV: 91.4 fL (ref 80.0–100.0)
Monocytes Absolute: 0.5 10*3/uL (ref 0.1–1.0)
Monocytes Relative: 11 %
Neutro Abs: 1.9 10*3/uL (ref 1.7–7.7)
Neutrophils Relative %: 37 %
Platelet Count: 285 10*3/uL (ref 150–400)
RBC: 4.19 MIL/uL (ref 3.87–5.11)
RDW: 14 % (ref 11.5–15.5)
WBC Count: 5 10*3/uL (ref 4.0–10.5)
nRBC: 0 % (ref 0.0–0.2)

## 2021-01-28 MED ORDER — DIPHENHYDRAMINE HCL 25 MG PO CAPS
50.0000 mg | ORAL_CAPSULE | Freq: Once | ORAL | Status: AC
  Administered 2021-01-28: 50 mg via ORAL

## 2021-01-28 MED ORDER — SODIUM CHLORIDE 0.9% FLUSH
10.0000 mL | Freq: Once | INTRAVENOUS | Status: AC
  Administered 2021-01-28: 10 mL

## 2021-01-28 MED ORDER — DIPHENHYDRAMINE HCL 25 MG PO CAPS
ORAL_CAPSULE | ORAL | Status: AC
  Filled 2021-01-28: qty 2

## 2021-01-28 MED ORDER — HEPARIN SOD (PORK) LOCK FLUSH 100 UNIT/ML IV SOLN
500.0000 [IU] | Freq: Once | INTRAVENOUS | Status: AC
  Administered 2021-01-28: 500 [IU]

## 2021-01-28 MED ORDER — PERTUZ-TRASTUZ-HYALURON-ZZXF 60-60-2000 MG-MG-U/ML CHEMO ~~LOC~~ SOLN
10.0000 mL | Freq: Once | SUBCUTANEOUS | Status: AC
  Administered 2021-01-28: 10 mL via SUBCUTANEOUS
  Filled 2021-01-28: qty 10

## 2021-01-28 MED ORDER — ACETAMINOPHEN 325 MG PO TABS
ORAL_TABLET | ORAL | Status: AC
  Filled 2021-01-28: qty 2

## 2021-01-28 MED ORDER — ACETAMINOPHEN 325 MG PO TABS
650.0000 mg | ORAL_TABLET | Freq: Once | ORAL | Status: AC
  Administered 2021-01-28: 650 mg via ORAL

## 2021-01-28 NOTE — Progress Notes (Signed)
[place]   Telephone:(336) [phone] Fax:(336) [phone]   Clinic Follow up Note   Patient Care Team: Julian Hy, PA-C as PCP - General (Physician Assistant) [name], MD as Consulting Physician (General Surgery) Truitt Merle, MD as Consulting Physician (Hematology) Kyung Rudd, MD as Consulting Physician (Radiation Oncology) Mauro Kaufmann, RN as Oncology Nurse Navigator Rockwell Germany, RN as Oncology Nurse Navigator  Date of Service:  01/28/2021  CHIEF COMPLAINT: f/u of metastatic right breast cancer  CURRENT THERAPY:  Maintenance Herceptin and Perjeta (Phesgo) q3weeks starting 06/18/20, held 09/22/20-11/24/20 due to low EF Letrozole 2.49m daily starting 06/19/23 Zometa starting 06/18/20  ASSESSMENT & PLAN:  [name] a 60y.o. female with   1. Malignant neoplasm of upper-outer quadrant of right breast, ductal carcinoma, Stage IIA, c(T3N1Mx), with bone and possible liver mets,  ER+/PR+/HER2+, Grade II-III  -She was diagnosed in 11/2019 with a large right breast mass in 4 quadrants measuring up to 11cm and right lymphadenopathy. Her right breast and LN biopsy was positive for invasive ductal carcinoma with components of DCIS. Her left axillary node biopsy was benign.  -01/01/20 Liver biopsy was negative for malignant cells but her 01/12/20 Bone Biopsy was positive for metastatic carcinoma from primary breast cancer. Her ER/PR/HER2 were all negative bone biopsy, possibly related to decalcification. -She was also seen to have left breast mass on her 12/12/19 MRI -She received THP 01/03/20-05/27/20. Her restaging PET scan from 04/28/20 showed no hypermetabolic lesions. Given her excellent response, I switched to maintenance therapy with Herceptin, Perjeta (Phesgo) q3weeks and Letrozole starting 06/18/20.   -Restaging CT and bone scan on 09/03/20 showed interval treatment response, both to osseous metastases and liver lesions. Will continue current maintenance therapy.   -Her HP was held previously due to decreased EF.  I reviewed his recent echocardiogram, which showed EF 55 to 60%, in the normal range, we will continue every 3 weeks for now.  If she slightly decreased on next echo, I will probably change to every 4 weeks. -labs reviewed, CBC AND CMP WNL, adequate for treatment. -Continue Phesgo injection every 3 weeks, follow-up in 6 weeks  2. Bone metastases from breast cancer -bone scan on 12/10/19 showed metastatic disease in left acetabulum, L3, and right 10th rib. -01/12/20 Bone Biopsy was positive for metastatic carcinoma from primary breast cancer. -PET on 04/28/20 after THP treatment showed no active disease. -Zometa started 06/18/20, will continue every 3 months, but will hold it for now due to lack of insurance coverage.  I encouraged her to keep up with dental care. She has no active dental issues. She is on calcium and VitD -most recent bone scan on 09/03/20 showed continued improvement.  3. Social Support -she lost insurance in 11/2020. -we are postponing her restaging scan until she has insurance again. I am comfortable with this given her normal tumor marker and good clinical disposition. -I will refer her to our financial assistance team for aid.   4. Chemotherapy-induced cardiomyopathy -echo 09/22/20 showed decreased EF -She was started on losartan by Dr. BHaroldine Lawson 10/21/20 -repeat echo on 01/05/21 was stable. Next echo in Nov    5. HTN -Well controlled on Metoprolol and Amlodipine.    6. Covid-19 (+) 11/06/20 -She experienced cough, chest congestion, bad headaches, and night fevers. She was prescribed paxlovid and recovered well     PLAN:  -proceed with Phesgo injection today -continue letrozole -labs, flush, and Phesgo in 3 and 6 weeks -f/u in 6 weeks -will  postpone her restaging scan to Jan 2023 when she has insurance coverage again    No problem-specific Assessment & Plan notes found for this encounter.   SUMMARY OF ONCOLOGIC  HISTORY: Oncology History Overview Note  Cancer Staging Malignant neoplasm of upper-outer quadrant of right breast in female, estrogen receptor positive ([place]) Staging form: Breast, AJCC 8th Edition - Clinical stage from 11/25/2019: Stage IIA (cT3, cN1, cM0, G2, ER+, PR+, HER2+) - Signed by Truitt Merle, MD on 12/03/2019    Malignant neoplasm of upper-outer quadrant of right breast in female, estrogen receptor positive ([place])  11/13/2019 Mammogram    Diagnostic Mammogram 11/13/19  IMPRESSION: -Highly suspicious mass and calcifications in the right breast centered between 9 and 12 o'clock spanning up to 11 cm.  -Multiple masses in the right axilla. One of the masses contains calcifications, denoted as mass number 2 measuring 9 x 11 by 10 mm. Several of these masses do not appear to represent definitive lymph nodes. Others may be small but abnormal appearing lymph nodes. Taken in total, a cluster of masses in the right axilla spans up to 3.7 cm.  -There are fibrocystic changes on the left. There are also some solid-appearing masses. A solid appearing mass at 10:30, 6 cm from the nipple measures 9 x 6 x 6 mm. An adjacent solid mass at 10 o'clock, 6 cm from the nipple measures 8 x 6 by 5 mm. Another solid-appearing mass at 11 o'clock, 1 cm from the nipple measures 5 x 4 by 5 mm. Fibrocystic changes are seen at 10 o'clock, 12 o'clock, and 4 o'clock. Another solid-appearing mass seen at 5 o'clock, 4 cm from the nipple measuring 6 x 3 x 4 mm. There is a single abnormal node in the left axilla with a cortex measuring 5 mm and restriction of the fatty hilum.   11/25/2019 Initial Biopsy   Diagnosis 1. Breast, right, needle core biopsy, upper outer - INVASIVE MAMMARY CARCINOMA - MAMMARY CARCINOMA IN SITU WITH CALCIFICATIONS AND NECROSIS - SEE COMMENT 2. Lymph node, needle/core biopsy, right axilla - INVASIVE MAMMARY CARCINOMA - SEE COMMENT 3. Lymph node, needle/core biopsy, left axilla - BENIGN LYMPH  NODE - NO CARCINOMA IDENTIFIED Microscopic Comment 1. The biopsy material shows an infiltrative proliferation of cells arranged linearly and in small clusters. Based on the biopsy, the carcinoma appears Nottingham grade 2 of 3 and measures 1.2 cm in greatest linear extent. E-cadherin and prognostic markers (ER/PR/ki-67/HER2)are pending and will be reported in an addendum. Dr. Tresa Moore reviewed the case and agrees with the above diagnosis. These results were called to [facility] on November 26, 2019. 2. Definitive nodal tissue is not identified. This biopsy has a slightly different architecture morphology than what is seen in part 1. The carcinoma measures 0.7 cm in greatest dimension. E-cadherin and prognostic markers (ER, PR, Ki-67 and HER-2) are pending and will be reported in an addendum.   11/25/2019 Receptors her2   1. PROGNOSTIC INDICATORS Results: IMMUNOHISTOCHEMICAL AND MORPHOMETRIC ANALYSIS PERFORMED MANUALLY The tumor cells are POSITIVE for Her2 (3+). Estrogen Receptor: 90%, POSITIVE, STRONG STAINING INTENSITY Progesterone Receptor: 20%, POSITIVE, STRONG STAINING INTENSITY Proliferation Marker Ki67: 30%  2. PROGNOSTIC INDICATORS Results: IMMUNOHISTOCHEMICAL AND MORPHOMETRIC ANALYSIS PERFORMED MANUALLY The tumor cells are POSITIVE for Her2 (3+). Estrogen Receptor: 90%, POSITIVE, STRONG STAINING INTENSITY Progesterone Receptor: 25%, POSITIVE, STRONG STAINING INTENSITY Proliferation Marker Ki67: 30%   11/25/2019 Cancer Staging   Staging form: Breast, AJCC 8th Edition - Clinical stage from 11/25/2019: Stage IIA (  cT3, cN1, cM0, G2, ER+, PR+, HER2+) - Signed by Truitt Merle, MD on 12/03/2019   12/01/2019 Initial Diagnosis   Malignant neoplasm of upper-outer quadrant of right breast in female, estrogen receptor positive ([place])   12/10/2019 Imaging   Bone Scan  IMPRESSION: Findings concerning for bony metastatic disease in the left acetabulum region, L3 vertebral body  region, and anterolateral right tenth rib.   Increased uptake in several large joints likely is of arthropathic etiology.   12/11/2019 Imaging   CT CAP w contrast  IMPRESSION: Prominent glandular tissue in the central right breast with nipple retraction, likely corresponding to the patient's known right breast neoplasm.   Prominent right axillary nodes measuring up to 8 mm short axis, suspicious for nodal metastases in this clinical context.   3 mm subpleural pulmonary nodule in the right lower lobe, likely benign. However, follow-up CT chest is suggested in 3-6 months.   Multiple hepatic lesions, including a dominant 2.4 cm lesion in segment 6, which is considered indeterminate. Metastasis is not excluded. Consider MRI abdomen with/without contrast for further characterization.   11 mm sclerotic lesion along the left posterior aspect of the T7 vertebral body raises concern for isolated osseous metastasis. Correlate with priors if available. Benign vertebral hemangioma at T11.   12/12/2019 Breast MRI   IMPRESSION: 1. The biopsy proven malignancy in the right breast involves all 4 quadrants and spans up to 9 cm by MRI. There is enhancement within the skin at the level of the nipple.   2. Multiple matted masses are seen in the right axilla, either abnormal metastatic lymph nodes or a combination of masses and metastatic lymph nodes. One of these has been previously biopsied showing invasive mammary carcinoma without definitive nodal tissue.   3.  There is a 1.5 cm Rotter's lymph node on the right.   4. There is markedly abnormal enhancement and edema in the right pectoralis major muscle spanning 6-7 cm, suspicious for metastatic disease.   5. There is diffuse nodular enhancement throughout the left breast, which may correspond with the solid-appearing masses seen on ultrasound. These all have a similar appearance on MRI.   6.  No findings of left axillary  lymphadenopathy.   12/15/2019 Procedure   PAC placed    12/24/2019 Imaging   MRI abdomen  IMPRESSION: 1. Multifocal enhancing bone lesions are noted within the lumbar spine, and bony pelvis compatible with metastatic disease. 2. Four, small peripherally enhancing cystic lesions are noted within the liver worrisome for metastatic disease. 3. 2 enhancing liver lesions are also noted with signal and enhancement characteristics consistent with benign liver hemangioma. 4. Well-circumscribed, enhancing T2 and T1 hypointense structure within the right adnexal region is indeterminate. It is unclear whether not this is arising from the right ovary, broad ligament or right side of uterine fundus. Differential considerations include fibrothecoma, versus pedunculated uterine leiomyoma. Metastatic disease is considered less favored. Attention on follow-up imaging is recommended.     01/03/2020 - 05/27/2020 Chemotherapy   First line THP q3weeks starting 01/03/20-05/27/20   01/05/2020 Imaging   US Abdomen  IMPRESSION: 1. Suspect hematoma in the medial right lobe of the liver in the area of previous biopsy. Note that recent MR does not show lesion in this area. This area has ill-defined margins and measures 3.9 x 4.0 x 3.5 cm. No perihepatic fluid.   2. Probable hemangiomas elsewhere in the right lobe of the liver. Note that several smaller lesions seen on recent MR are not appreciable  by ultrasound.   3.  Study otherwise unremarkable.   01/12/2020 Pathology Results   FINAL MICROSCOPIC DIAGNOSIS:   A. BONE, SCLEROTIC LESION, LEFT ANTERIOR ILIAC SPINE, BIOPSY:  - Metastatic carcinoma, consistent with patient's clinical history of  primary breast carcinoma  - See comment   COMMENT:  Immunohistochemical stains show that the tumor cells are positive for  CK7 and GATA3; and negative for CK20, consistent with above  interpretation.   PROGNOSTIC INDICATOR RESULTS:  The tumor cells are NEGATIVE  for Her2 (0); see comment  Estrogen Receptor: NEGATIVE; see comment  Progesterone Receptor: NEGATIVE; see comment    03/23/2020 Imaging   CT CAP IMPRESSION: 1. Right lower lobe perifissural nodule is slightly more conspicuous on today's study. Close attention on follow-up recommended. 2. Interval progression of segment IV liver lesion, concerning for metastatic progression. The remaining visualized liver lesions are stable to smaller in the interval. 3. Interval evolution in appearance of thoracolumbar spinal lesions and left iliac crest lesion, potentially reflecting response to therapy/healing. No definite new osseous lesions on today's study. 4. New small fluid collection right cul-de-sac. 5. Probable fibroid change in the uterus including exophytic right fundal lesion. 6. Aortic Atherosclerosis (ICD10-I70.0).   03/31/2020 Imaging   MR ABD IMPRESSION: 1. Enlarging hepatic lesion in hepatic subsegment IV suspicious for hepatic metastasis. The it is uncertain whether the change in the short interval is due to technical factors with different modalities or true enlargement. 2. Lesion in the posterior RIGHT hepatic lobe with imaging features that are atypical for hemangioma but stability and signs of enhancement on later phase raising this question. Another more classic appearing may angioma seen inferior to this location in hepatic subsegment VI. Attention on follow-up. 3. Heterogeneous enhancement throughout the spine particularly at L3 as exhibited on the prior study with very patchy marrow signal remains suspicious for bony metastatic lesions in this patient with known bone metastases.   04/01/2020 Imaging   Bone scan IMPRESSION: Good response to therapy with resolution of sites of uptake seen previously at the cervical spine and posterior RIGHT tenth rib as well as a diminished degree of uptake at remaining previously identified osseous metastatic foci.   04/28/2020 PET scan    IMPRESSION: 1. No hypermetabolic lesions to suggest active metastatic disease involving the liver or osseous structures. 2. Stable small low-attenuation liver lesions, likely treated disease.   06/18/2020 -  Chemotherapy   Given her excellent response, I switched to maintenance therapy with Herceptin, Perjeta (Phesgo) q3weeks and Letrozole.   06/18/2020 -  Chemotherapy   Zometa starting 06/18/20   06/18/2020 -  Anti-estrogen oral therapy   Letrozole 2.66m once dialy.    09/03/2020 Imaging   Bone Scan IMPRESSION: 1. Significant interval improvement compared to previous studies. Only mild uptake remains in the pelvis as above. There has been significant interval improvement over time.  CT C/A/P IMPRESSION: 1. Hypodense liver lesions are slightly decreased in size compared to prior examination, consistent with treatment response of hepatic metastatic disease. 2. Unchanged sclerotic lesion of the anterior left iliac crest. Unchanged lytic superior endplate deformity of L3, which remains equivocal for metastatic lesion versus mechanical Schmorl deformity. No CT evidence of new osseous metastatic disease. 3. No evidence of new metastatic disease in the chest, abdomen, or pelvis. 4. Coronary artery disease.      INTERVAL HISTORY:  KBerta Densonis here for a follow up of metastatic breast cancer. She was last seen by me on 11/25/20. She presents to the clinic  alone. She reports she is feeling well overall. "I can't complain!"   All other systems were reviewed with the patient and are negative.  MEDICAL HISTORY:  Past Medical History:  Diagnosis Date   Cancer ([place]) 2021   Hypertension     SURGICAL HISTORY: Past Surgical History:  Procedure Laterality Date   left parotid tumor removal   2006   PORTACATH PLACEMENT Left 12/15/2019   Procedure: INSERTION PORT-A-CATH WITH ULTRASOUND GUIDANCE;  Surgeon: [name], MD;  Location: [facility];  Service: General;  Laterality: Left;    TONSILLECTOMY      I have reviewed the social history and family history with the patient and they are unchanged from previous note.  ALLERGIES:  has No Known Allergies.  MEDICATIONS:  Current Outpatient Medications  Medication Sig Dispense Refill   acetaminophen (TYLENOL) 500 MG tablet Take 1,000 mg by mouth every 6 (six) hours as needed for moderate pain or headache.     amLODipine (NORVASC) 10 MG tablet Take 1 tablet (10 mg total) by mouth daily. 90 tablet 3   Cholecalciferol (VITAMIN D3) 50 MCG (2000 UT) TABS Take 2,000 Units by mouth daily.      KLOR-CON M20 20 MEQ tablet TAKE 1 TABLET BY MOUTH EVERY DAY 90 tablet 1   letrozole (FEMARA) 2.5 MG tablet TAKE 1 TABLET BY MOUTH EVERY DAY 90 tablet 1   lidocaine-prilocaine (EMLA) cream Apply 1 application topically as needed. 30 g 1   losartan (COZAAR) 50 MG tablet Take 1 tablet (50 mg total) by mouth daily. 90 tablet 3   metoprolol succinate (TOPROL-XL) 50 MG 24 hr tablet Take 1 tablet (50 mg total) by mouth daily. 90 tablet 3   omeprazole (PRILOSEC OTC) 20 MG tablet Take 20 mg by mouth daily.     No current facility-administered medications for this visit.    PHYSICAL EXAMINATION: ECOG PERFORMANCE STATUS: 0 - Asymptomatic  Vitals:   01/28/21 1000  BP: (!) 144/81  Pulse: 75  Resp: 18  Temp: 98 F (36.7 C)  SpO2: 98%   Wt Readings from Last 3 Encounters:  01/28/21 195 lb 4.8 oz (88.6 kg)  01/05/21 192 lb 6.4 oz (87.3 kg)  12/17/20 191 lb (86.6 kg)     GENERAL:alert, no distress and comfortable SKIN: skin color normal, no rashes or significant lesions EYES: normal, Conjunctiva are pink and non-injected, sclera clear  NEURO: alert & oriented x 3 with fluent speech  LABORATORY DATA:  I have reviewed the data as listed CBC Latest Ref Rng & Units 01/28/2021 11/25/2020 10/22/2020  WBC 4.0 - 10.5 K/uL 5.0 6.2 5.5  Hemoglobin 12.0 - 15.0 g/dL 12.5 12.0 12.9  Hematocrit 36.0 - 46.0 % 38.3 37.3 39.3  Platelets 150 - 400 K/uL  285 315 290     CMP Latest Ref Rng & Units 01/28/2021 11/25/2020 10/22/2020  Glucose 70 - 99 mg/dL 108(H) 90 106(H)  BUN 6 - 20 mg/dL _0 Creatinine 0.44 - 1.00 mg/dL 0.84 0.79 0.84  Sodium 135 - 145 mmol/L 142 141 144  Potassium 3.5 - 5.1 mmol/L 3.4(L) 3.8 3.6  Chloride 98 - 111 mmol/L 110 107 109  CO2 22 - 32 mmol/L _1 Calcium 8.9 - 10.3 mg/dL 9.1 9.5 9.0  Total Protein 6.5 - 8.1 g/dL 6.9 7.1 7.2  Total Bilirubin 0.3 - 1.2 mg/dL 0.4 0.4 0.4  Alkaline Phos 38 - 126 U/L 82 79 84  AST 15 - 41 U/L 13(L) 18 14(L)  ALT 0 - 44 U/L _0 RADIOGRAPHIC STUDIES: I have personally reviewed the radiological images as listed and agreed with the findings in the report. No results found.    No orders of the defined types were placed in this encounter.  All questions were answered. The patient knows to call the clinic with any problems, questions or concerns. No barriers to learning was detected. The total time spent in the appointment was 30 minutes.     Truitt Merle, MD 01/28/2021   I, Wilburn Mylar, am acting as scribe for Truitt Merle, MD.   I have reviewed the above documentation for accuracy and completeness, and I agree with the above.

## 2021-01-28 NOTE — Patient Instructions (Signed)
[facility]  Discharge Instructions: Thank you for choosing [facility] to provide your oncology and hematology care.   If you have a lab appointment with the [facility], please go directly to the [facility] and check in at the registration area.   Wear comfortable clothing and clothing appropriate for easy access to any Portacath or PICC line.   We strive to give you quality time with your provider. You may need to reschedule your appointment if you arrive late (15 or more minutes).  Arriving late affects you and other patients whose appointments are after yours.  Also, if you miss three or more appointments without notifying the office, you may be dismissed from the clinic at the provider's discretion.      For prescription refill requests, have your pharmacy contact our office and allow 72 hours for refills to be completed.    Today you received the following chemotherapy and/or immunotherapy agents Phesgo      To help prevent nausea and vomiting after your treatment, we encourage you to take your nausea medication as directed.  BELOW ARE SYMPTOMS THAT SHOULD BE REPORTED IMMEDIATELY: *FEVER GREATER THAN 100.4 F (38 C) OR HIGHER *CHILLS OR SWEATING *NAUSEA AND VOMITING THAT IS NOT CONTROLLED WITH YOUR NAUSEA MEDICATION *UNUSUAL SHORTNESS OF BREATH *UNUSUAL BRUISING OR BLEEDING *URINARY PROBLEMS (pain or burning when urinating, or frequent urination) *BOWEL PROBLEMS (unusual diarrhea, constipation, pain near the anus) TENDERNESS IN MOUTH AND THROAT WITH OR WITHOUT PRESENCE OF ULCERS (sore throat, sores in mouth, or a toothache) UNUSUAL RASH, SWELLING OR PAIN  UNUSUAL VAGINAL DISCHARGE OR ITCHING   Items with * indicate a potential emergency and should be followed up as soon as possible or go to the Emergency Department if any problems should occur.  Please show the CHEMOTHERAPY ALERT CARD or IMMUNOTHERAPY ALERT CARD at check-in to the  Emergency Department and triage nurse.  Should you have questions after your visit or need to cancel or reschedule your appointment, please contact [facility]  Dept: [phone]  and follow the prompts.  Office hours are 8:00 a.m. to 4:30 p.m. Monday - Friday. Please note that voicemails left after 4:00 p.m. may not be returned until the following business day.  We are closed weekends and major holidays. You have access to a nurse at all times for urgent questions. Please call the main number to the clinic Dept: [phone] and follow the prompts.   For any non-urgent questions, you may also contact your provider using MyChart. We now offer e-Visits for anyone 6 and older to request care online for non-urgent symptoms. For details visit [email or web address].   Also download the MyChart app! Go to the app store, search "MyChart", open the app, select [facility], and log in with your MyChart username and password.  Due to Covid, a mask is required upon entering the hospital/clinic. If you do not have a mask, one will be given to you upon arrival. For doctor visits, patients may have 1 support person aged 8 or older with them. For treatment visits, patients cannot have anyone with them due to current Covid guidelines and our immunocompromised population.

## 2021-01-28 NOTE — Telephone Encounter (Signed)
Scheduled follow-up appointment per 10/7 los. Patient requested appointment for 10/28 to be done at [facility]. Sent scheduling message for them to schedule appointment.

## 2021-02-15 ENCOUNTER — Other Ambulatory Visit: Payer: Self-pay | Admitting: Hematology

## 2021-02-18 ENCOUNTER — Inpatient Hospital Stay: Payer: Self-pay

## 2021-02-18 ENCOUNTER — Other Ambulatory Visit: Payer: Self-pay

## 2021-02-18 VITALS — BP 135/81 | HR 80 | Temp 97.7°F | Resp 18

## 2021-02-18 DIAGNOSIS — C50411 Malignant neoplasm of upper-outer quadrant of right female breast: Secondary | ICD-10-CM

## 2021-02-18 DIAGNOSIS — Z170 Estrogen receptor positive status [ER+]: Secondary | ICD-10-CM

## 2021-02-18 LAB — CBC WITH DIFFERENTIAL (CANCER CENTER ONLY)
Abs Immature Granulocytes: 0.01 10*3/uL (ref 0.00–0.07)
Basophils Absolute: 0 10*3/uL (ref 0.0–0.1)
Basophils Relative: 0 %
Eosinophils Absolute: 0.2 10*3/uL (ref 0.0–0.5)
Eosinophils Relative: 3 %
HCT: 39.8 % (ref 36.0–46.0)
Hemoglobin: 13.1 g/dL (ref 12.0–15.0)
Immature Granulocytes: 0 %
Lymphocytes Relative: 43 %
Lymphs Abs: 2.3 10*3/uL (ref 0.7–4.0)
MCH: 30 pg (ref 26.0–34.0)
MCHC: 32.9 g/dL (ref 30.0–36.0)
MCV: 91.3 fL (ref 80.0–100.0)
Monocytes Absolute: 0.4 10*3/uL (ref 0.1–1.0)
Monocytes Relative: 8 %
Neutro Abs: 2.5 10*3/uL (ref 1.7–7.7)
Neutrophils Relative %: 46 %
Platelet Count: 275 10*3/uL (ref 150–400)
RBC: 4.36 MIL/uL (ref 3.87–5.11)
RDW: 13.8 % (ref 11.5–15.5)
WBC Count: 5.4 10*3/uL (ref 4.0–10.5)
nRBC: 0 % (ref 0.0–0.2)

## 2021-02-18 LAB — CMP (CANCER CENTER ONLY)
ALT: 8 U/L (ref 0–44)
AST: 13 U/L — ABNORMAL LOW (ref 15–41)
Albumin: 4.2 g/dL (ref 3.5–5.0)
Alkaline Phosphatase: 78 U/L (ref 38–126)
Anion gap: 7 (ref 5–15)
BUN: 13 mg/dL (ref 6–20)
CO2: 26 mmol/L (ref 22–32)
Calcium: 9.4 mg/dL (ref 8.9–10.3)
Chloride: 109 mmol/L (ref 98–111)
Creatinine: 0.78 mg/dL (ref 0.44–1.00)
GFR, Estimated: >60 mL/min (ref >60)
Glucose, Bld: 120 mg/dL — ABNORMAL HIGH (ref 70–99)
Potassium: 3.2 mmol/L — ABNORMAL LOW (ref 3.5–5.1)
Sodium: 142 mmol/L (ref 135–145)
Total Bilirubin: 0.6 mg/dL (ref 0.3–1.2)
Total Protein: 7.2 g/dL (ref 6.5–8.1)

## 2021-02-18 MED ORDER — SODIUM CHLORIDE 0.9 % IV SOLN: Freq: Once | INTRAVENOUS | Status: DC

## 2021-02-18 MED ORDER — PERTUZ-TRASTUZ-HYALURON-ZZXF 60-60-2000 MG-MG-U/ML CHEMO ~~LOC~~ SOLN
10.0000 mL | Freq: Once | SUBCUTANEOUS | Status: AC
Start: 2021-02-18 — End: 2021-02-18
  Administered 2021-02-18: 10 mL via SUBCUTANEOUS
  Filled 2021-02-18: qty 10

## 2021-02-18 MED ORDER — DIPHENHYDRAMINE HCL 25 MG PO CAPS
50.0000 mg | ORAL_CAPSULE | Freq: Once | ORAL | Status: AC
  Administered 2021-02-18: 50 mg via ORAL
  Filled 2021-02-18: qty 2

## 2021-02-18 MED ORDER — SODIUM CHLORIDE 0.9% FLUSH
10.0000 mL | INTRAVENOUS | Status: DC | PRN
Start: 2021-02-18 — End: 2021-02-18
  Administered 2021-02-18: 10 mL

## 2021-02-18 MED ORDER — HEPARIN SOD (PORK) LOCK FLUSH 100 UNIT/ML IV SOLN
500.0000 [IU] | Freq: Once | INTRAVENOUS | Status: AC | PRN
  Administered 2021-02-18: 500 [IU]

## 2021-02-18 MED ORDER — ACETAMINOPHEN 325 MG PO TABS
650.0000 mg | ORAL_TABLET | Freq: Once | ORAL | Status: AC
  Administered 2021-02-18: 650 mg via ORAL
  Filled 2021-02-18: qty 2

## 2021-02-18 NOTE — Progress Notes (Signed)
Patient presents for treatment. RN assessment completed along with the following:  Labs/vitals reviewed - Yes, and within treatment parameters.  Labs not required for treatment plan.  Weight within 10% of previous measurement - Yes Oncology Treatment Attestation completed for current therapy- Yes, on date 05/27/20 Informed consent completed and reflects current therapy/intent - Yes, on date 06/18/20             Provider progress note reviewed - Patient not seen by provider today. Most recent note dated 01/28/21 reviewed. Treatment/Antibody/Supportive plan reviewed - Yes, and there are no adjustments needed for today's treatment. S&H and other orders reviewed - Yes, and there are no additional orders identified. Previous treatment date reviewed - Yes, and the appropriate amount of time has elapsed between treatments.   Patient to proceed with treatment.    Patient monitored for 15 minutes post injection. VSS upon leaving infusion room.

## 2021-02-18 NOTE — Patient Instructions (Signed)
Carolyn Stewart  Discharge Instructions: Thank you for choosing [facility] to provide your oncology and hematology care.   If you have a lab appointment with the [facility], please go directly to the [facility] and check in at the registration area.   Wear comfortable clothing and clothing appropriate for easy access to any Portacath or PICC line.   We strive to give you quality time with your provider. You may need to reschedule your appointment if you arrive late (15 or more minutes).  Arriving late affects you and other patients whose appointments are after yours.  Also, if you miss three or more appointments without notifying the office, you may be dismissed from the clinic at the provider's discretion.      For prescription refill requests, have your pharmacy contact our office and allow 72 hours for refills to be completed.    Today you received the following chemotherapy and/or immunotherapy agents [place]      To help prevent nausea and vomiting after your treatment, we encourage you to take your nausea medication as directed.  BELOW ARE SYMPTOMS THAT SHOULD BE REPORTED IMMEDIATELY: *FEVER GREATER THAN 100.4 F (38 C) OR HIGHER *CHILLS OR SWEATING *NAUSEA AND VOMITING THAT IS NOT CONTROLLED WITH YOUR NAUSEA MEDICATION *UNUSUAL SHORTNESS OF BREATH *UNUSUAL BRUISING OR BLEEDING *URINARY PROBLEMS (pain or burning when urinating, or frequent urination) *BOWEL PROBLEMS (unusual diarrhea, constipation, pain near the anus) TENDERNESS IN MOUTH AND THROAT WITH OR WITHOUT PRESENCE OF ULCERS (sore throat, sores in mouth, or a toothache) UNUSUAL RASH, SWELLING OR PAIN  UNUSUAL VAGINAL DISCHARGE OR ITCHING   Items with * indicate a potential emergency and should be followed up as soon as possible or go to the Emergency Department if any problems should occur.  Please show the CHEMOTHERAPY ALERT CARD or IMMUNOTHERAPY ALERT CARD at check-in to the  Emergency Department and triage nurse.  Should you have questions after your visit or need to cancel or reschedule your appointment, please contact [facility]  Dept: [phone]  and follow the prompts.  Office hours are 8:00 a.m. to 4:30 p.m. Monday - Friday. Please note that voicemails left after 4:00 p.m. may not be returned until the following business day.  We are closed weekends and major holidays. You have access to a nurse at all times for urgent questions. Please call the main number to the clinic Dept: [phone] and follow the prompts.   For any non-urgent questions, you may also contact your provider using MyChart. We now offer e-Visits for anyone 33 and older to request care online for non-urgent symptoms. For details visit [email or web address].   Also download the MyChart app! Go to the app store, search "MyChart", open the app, select [facility], and log in with your MyChart username and password.  Due to Covid, a mask is required upon entering the hospital/clinic. If you do not have a mask, one will be given to you upon arrival. For doctor visits, patients may have 1 support person aged 35 or older with them. For treatment visits, patients cannot have anyone with them due to current Covid guidelines and our immunocompromised population.

## 2021-02-18 NOTE — Patient Instructions (Signed)
Implanted Port Home Guide An implanted port is a device that is placed under the skin. It is usually placed in the chest. The device can be used to give IV medicine, to take blood, or for dialysis. You may have an implanted port if: You need IV medicine that would be irritating to the small veins in your hands or arms. You need IV medicines, such as antibiotics, for a long period of time. You need IV nutrition for a long period of time. You need dialysis. When you have a port, your health care provider can choose to use the port instead of veins in your arms for these procedures. You may have fewer limitations when using a port than you would if you used other types of long-term IVs, and you will likely be able to return to normal activities after your incision heals. An implanted port has two main parts: Reservoir. The reservoir is the part where a needle is inserted to give medicines or draw blood. The reservoir is round. After it is placed, it appears as a small, raised area under your skin. Catheter. The catheter is a thin, flexible tube that connects the reservoir to a vein. Medicine that is inserted into the reservoir goes into the catheter and then into the vein. How is my port accessed? To access your port: A numbing cream may be placed on the skin over the port site. Your health care provider will put on a mask and sterile gloves. The skin over your port will be cleaned carefully with a germ-killing soap and allowed to dry. Your health care provider will gently pinch the port and insert a needle into it. Your health care provider will check for a blood return to make sure the port is in the vein and is not clogged. If your port needs to remain accessed to get medicine continuously (constant infusion), your health care provider will place a clear bandage (dressing) over the needle site. The dressing and needle will need to be changed every week, or as told by your health care provider. What  is flushing? Flushing helps keep the port from getting clogged. Follow instructions from your health care provider about how and when to flush the port. Ports are usually flushed with saline solution or a medicine called heparin. The need for flushing will depend on how the port is used: If the port is only used from time to time to give medicines or draw blood, the port may need to be flushed: Before and after medicines have been given. Before and after blood has been drawn. As part of routine maintenance. Flushing may be recommended every 4-6 weeks. If a constant infusion is running, the port may not need to be flushed. Throw away any syringes in a disposal container that is meant for sharp items (sharps container). You can buy a sharps container from a pharmacy, or you can make one by using an empty hard plastic bottle with a cover. How long will my port stay implanted? The port can stay in for as long as your health care provider thinks it is needed. When it is time for the port to come out, a surgery will be done to remove it. The surgery will be similar to the procedure that was done to put the port in. Follow these instructions at home:  Flush your port as told by your health care provider. If you need an infusion over several days, follow instructions from your health care provider about how   to take care of your port site. Make sure you: Wash your hands with soap and water before you change your dressing. If soap and water are not available, use alcohol-based hand sanitizer. Change your dressing as told by your health care provider. Place any used dressings or infusion bags into a plastic bag. Throw that bag in the trash. Keep the dressing that covers the needle clean and dry. Do not get it wet. Do not use scissors or sharp objects near the tube. Keep the tube clamped, unless it is being used. Check your port site every day for signs of infection. Check for: Redness, swelling, or  pain. Fluid or blood. Pus or a bad smell. Protect the skin around the port site. Avoid wearing bra straps that rub or irritate the site. Protect the skin around your port from seat belts. Place a soft pad over your chest if needed. Bathe or shower as told by your health care provider. The site may get wet as long as you are not actively receiving an infusion. Return to your normal activities as told by your health care provider. Ask your health care provider what activities are safe for you. Carry a medical alert card or wear a medical alert bracelet at all times. This will let health care providers know that you have an implanted port in case of an emergency. Get help right away if: You have redness, swelling, or pain at the port site. You have fluid or blood coming from your port site. You have pus or a bad smell coming from the port site. You have a fever. Summary Implanted ports are usually placed in the chest for long-term IV access. Follow instructions from your health care provider about flushing the port and changing bandages (dressings). Take care of the area around your port by avoiding clothing that puts pressure on the area, and by watching for signs of infection. Protect the skin around your port from seat belts. Place a soft pad over your chest if needed. Get help right away if you have a fever or you have redness, swelling, pain, drainage, or a bad smell at the port site. This information is not intended to replace advice given to you by your health care provider. Make sure you discuss any questions you have with your health care provider. Document Revised: 06/30/2020 Document Reviewed: 08/25/2019 Elsevier Patient Education  2022 [employer]  

## 2021-02-21 ENCOUNTER — Other Ambulatory Visit: Payer: Self-pay

## 2021-02-21 ENCOUNTER — Telehealth: Payer: Self-pay

## 2021-02-21 DIAGNOSIS — E876 Hypokalemia: Secondary | ICD-10-CM

## 2021-02-21 MED ORDER — POTASSIUM CHLORIDE CRYS ER 20 MEQ PO TBCR: 20.0000 meq | EXTENDED_RELEASE_TABLET | Freq: Two times a day (BID) | ORAL | 1 refills | Status: DC

## 2021-02-21 NOTE — Telephone Encounter (Signed)
This nurse reached out to patient to inform her of below mentioned lab results and MD recommendations.  No answer and Voicemail did not pick up.  This nurse will also send patient a message on My Chart.  No further concerns noted at this time.

## 2021-02-21 NOTE — Telephone Encounter (Signed)
-----   Message from Truitt Merle, MD sent at 02/19/2021  9:19 PM EDT ----- Please let pt know her K was low yesterday, and increase KCL by 47meq/day. Make sure she is taking it. Thanks   Truitt Merle  02/19/2021

## 2021-03-09 ENCOUNTER — Other Ambulatory Visit: Payer: Self-pay

## 2021-03-09 ENCOUNTER — Ambulatory Visit (HOSPITAL_COMMUNITY)
Admission: RE | Admit: 2021-03-09 | Discharge: 2021-03-09 | Disposition: A | Discharge status: Home / Self Care | Payer: Self-pay | Source: Ambulatory Visit | Attending: Internal Medicine | Admitting: Internal Medicine

## 2021-03-09 DIAGNOSIS — I509 Heart failure, unspecified: Secondary | ICD-10-CM | POA: Insufficient documentation

## 2021-03-09 DIAGNOSIS — I110 Hypertensive heart disease with heart failure: Secondary | ICD-10-CM | POA: Insufficient documentation

## 2021-03-09 DIAGNOSIS — T451X5A Adverse effect of antineoplastic and immunosuppressive drugs, initial encounter: Secondary | ICD-10-CM

## 2021-03-09 DIAGNOSIS — I351 Nonrheumatic aortic (valve) insufficiency: Secondary | ICD-10-CM | POA: Insufficient documentation

## 2021-03-09 DIAGNOSIS — I427 Cardiomyopathy due to drug and external agent: Secondary | ICD-10-CM | POA: Insufficient documentation

## 2021-03-09 DIAGNOSIS — I371 Nonrheumatic pulmonary valve insufficiency: Secondary | ICD-10-CM | POA: Insufficient documentation

## 2021-03-09 LAB — ECHOCARDIOGRAM COMPLETE
Area-P 1/2: 4.06 cm2
S' Lateral: 3.6 cm

## 2021-03-11 ENCOUNTER — Inpatient Hospital Stay (HOSPITAL_BASED_OUTPATIENT_CLINIC_OR_DEPARTMENT_OTHER): Payer: Self-pay | Admitting: Hematology

## 2021-03-11 ENCOUNTER — Encounter: Payer: Self-pay | Admitting: Hematology

## 2021-03-11 ENCOUNTER — Other Ambulatory Visit: Payer: Self-pay

## 2021-03-11 ENCOUNTER — Inpatient Hospital Stay: Payer: Self-pay | Attending: Hematology

## 2021-03-11 ENCOUNTER — Inpatient Hospital Stay: Payer: Self-pay

## 2021-03-11 VITALS — BP 152/91 | HR 84 | Temp 98.2°F | Resp 18 | Ht 67.0 in | Wt 192.4 lb

## 2021-03-11 DIAGNOSIS — C50411 Malignant neoplasm of upper-outer quadrant of right female breast: Secondary | ICD-10-CM

## 2021-03-11 DIAGNOSIS — C7951 Secondary malignant neoplasm of bone: Secondary | ICD-10-CM | POA: Insufficient documentation

## 2021-03-11 DIAGNOSIS — Z5111 Encounter for antineoplastic chemotherapy: Secondary | ICD-10-CM | POA: Insufficient documentation

## 2021-03-11 DIAGNOSIS — Z170 Estrogen receptor positive status [ER+]: Secondary | ICD-10-CM

## 2021-03-11 LAB — CMP (CANCER CENTER ONLY)
ALT: 10 U/L (ref 0–44)
AST: 19 U/L (ref 15–41)
Albumin: 4.2 g/dL (ref 3.5–5.0)
Alkaline Phosphatase: 93 U/L (ref 38–126)
Anion gap: 7 (ref 5–15)
BUN: 15 mg/dL (ref 6–20)
CO2: 24 mmol/L (ref 22–32)
Calcium: 9 mg/dL (ref 8.9–10.3)
Chloride: 110 mmol/L (ref 98–111)
Creatinine: 0.76 mg/dL (ref 0.44–1.00)
GFR, Estimated: >60 mL/min (ref >60)
Glucose, Bld: 86 mg/dL (ref 70–99)
Potassium: 3.7 mmol/L (ref 3.5–5.1)
Sodium: 141 mmol/L (ref 135–145)
Total Bilirubin: 0.3 mg/dL (ref 0.3–1.2)
Total Protein: 7.4 g/dL (ref 6.5–8.1)

## 2021-03-11 LAB — CBC WITH DIFFERENTIAL (CANCER CENTER ONLY)
Abs Immature Granulocytes: 0.01 10*3/uL (ref 0.00–0.07)
Basophils Absolute: 0 10*3/uL (ref 0.0–0.1)
Basophils Relative: 0 %
Eosinophils Absolute: 0.1 10*3/uL (ref 0.0–0.5)
Eosinophils Relative: 2 %
HCT: 39 % (ref 36.0–46.0)
Hemoglobin: 13.1 g/dL (ref 12.0–15.0)
Immature Granulocytes: 0 %
Lymphocytes Relative: 45 %
Lymphs Abs: 2.6 10*3/uL (ref 0.7–4.0)
MCH: 30 pg (ref 26.0–34.0)
MCHC: 33.6 g/dL (ref 30.0–36.0)
MCV: 89.2 fL (ref 80.0–100.0)
Monocytes Absolute: 0.7 10*3/uL (ref 0.1–1.0)
Monocytes Relative: 11 %
Neutro Abs: 2.5 10*3/uL (ref 1.7–7.7)
Neutrophils Relative %: 42 %
Platelet Count: 285 10*3/uL (ref 150–400)
RBC: 4.37 MIL/uL (ref 3.87–5.11)
RDW: 13.2 % (ref 11.5–15.5)
WBC Count: 6 10*3/uL (ref 4.0–10.5)
nRBC: 0 % (ref 0.0–0.2)

## 2021-03-11 MED ORDER — ACETAMINOPHEN 325 MG PO TABS
650.0000 mg | ORAL_TABLET | Freq: Once | ORAL | Status: AC
  Administered 2021-03-11: 650 mg via ORAL
  Filled 2021-03-11: qty 2

## 2021-03-11 MED ORDER — LETROZOLE 2.5 MG PO TABS: 2.5000 mg | ORAL_TABLET | Freq: Every day | ORAL | 1 refills | Status: DC

## 2021-03-11 MED ORDER — SODIUM CHLORIDE 0.9% FLUSH: 10.0000 mL | Freq: Once | INTRAVENOUS | Status: DC

## 2021-03-11 MED ORDER — PERTUZ-TRASTUZ-HYALURON-ZZXF 60-60-2000 MG-MG-U/ML CHEMO ~~LOC~~ SOLN
10.0000 mL | Freq: Once | SUBCUTANEOUS | Status: AC
  Administered 2021-03-11: 10 mL via SUBCUTANEOUS
  Filled 2021-03-11: qty 10

## 2021-03-11 MED ORDER — DIPHENHYDRAMINE HCL 25 MG PO CAPS
50.0000 mg | ORAL_CAPSULE | Freq: Once | ORAL | Status: AC
  Administered 2021-03-11: 50 mg via ORAL
  Filled 2021-03-11: qty 2

## 2021-03-11 NOTE — Progress Notes (Signed)
[place]   [phone] [phone]   Clinic Follow up Note   Patient Care Team: Julian Hy, PA-C as PCP - General (Physician Assistant) [name], MD as Consulting Physician (General Surgery) Truitt Merle, MD as Consulting Physician (Hematology) Kyung Rudd, MD as Consulting Physician (Radiation Oncology) Mauro Kaufmann, RN as Oncology Nurse Navigator Rockwell Germany, RN as Oncology Nurse Navigator  Date of Service:  03/11/2021  CHIEF COMPLAINT: f/u of metastatic breast cancer  CURRENT THERAPY:  Maintenance Herceptin and Perjeta (Phesgo) q3weeks starting 06/18/20, held 09/22/20-11/24/20 due to low EF Letrozole 2.19m daily starting 06/19/23 Zometa starting 06/18/20  ASSESSMENT & PLAN:  [name] a 60y.o. female with   1. Malignant neoplasm of upper-outer quadrant of right breast, ductal carcinoma, Stage IIA, c(T3N1Mx), with bone and possible liver mets,  ER+/PR+/HER2+, Grade II-III  -She was diagnosed in 11/2019 with a large right breast mass in 4 quadrants measuring up to 11cm and right lymphadenopathy. Her right breast and LN biopsy was positive for invasive ductal carcinoma with components of DCIS. Her left axillary node biopsy was benign.  -01/01/20 Liver biopsy was negative for malignant cells but her 01/12/20 Bone Biopsy was positive for metastatic carcinoma from primary breast cancer. Her ER/PR/HER2 were all negative bone biopsy, possibly related to decalcification. -She was also seen to have left breast mass on her 12/12/19 MRI -She received THP 01/03/20-05/27/20. Her restaging PET scan from 04/28/20 showed no hypermetabolic lesions. Given her excellent response, I switched to maintenance therapy with Herceptin, Perjeta (Phesgo) q3weeks and Letrozole starting 06/18/20.   -Restaging CT and bone scan on 09/03/20 showed interval treatment response, both to osseous metastases and liver lesions. Will continue current maintenance therapy.  -Echo  in 09/2020 showed dropped EF 45-50%, treatment held temperately. Repeat echo on 03/09/21 showed stable EF 55%, will continue Phesgo -labs reviewed, CBC and CMP WNL, adequate for treatment. She continues to tolerate well with no side effects. -Continue Phesgo injection every 3 weeks, follow-up in 6 weeks -she wants to delay her restaging CT scan until she regains her insurance in Jan 2023    2. Bone metastases from breast cancer -bone scan on 12/10/19 showed metastatic disease in left acetabulum, L3, and right 10th rib. -01/12/20 Bone Biopsy was positive for metastatic carcinoma from primary breast cancer. -PET on 04/28/20 after THP treatment showed no active disease. -Zometa started 06/18/20, will continue every 3 months, but will hold it for now due to lack of insurance coverage.  I encouraged her to keep up with dental care. She has no active dental issues. She is on calcium and VitD -most recent bone scan on 09/03/20 showed continued improvement.   3. Social Support -she lost insurance in 11/2020. -we are postponing her restaging scan until she has insurance again. I am comfortable with this given her normal tumor marker and good clinical disposition. -I will refer her to our financial assistance team for aid.   4. Chemotherapy-induced cardiomyopathy -echo 09/22/20 showed decreased EF -She was started on losartan by Dr. BHaroldine Lawson 10/21/20 -repeat echo on 03/09/21 was stable.    5. HTN -Well controlled on Metoprolol and Amlodipine.    6. Covid-19 (+) 11/06/20 -She experienced cough, chest congestion, bad headaches, and night fevers. She was prescribed paxlovid and recovered well     PLAN:  -proceed with Phesgo injection today -continue letrozole -labs, flush, and Phesgo in 3 and 6 weeks  -she prefers [facility] for appointment on days she doesn't  see me  -she prefers Friday appointments -f/u in 6 weeks -will postpone her restaging scan to Jan 2023 when she has insurance coverage again     No problem-specific Assessment & Plan notes found for this encounter.   SUMMARY OF ONCOLOGIC HISTORY: Oncology History Overview Note  Cancer Staging Malignant neoplasm of upper-outer quadrant of right breast in female, estrogen receptor positive ([place]) Staging form: Breast, AJCC 8th Edition - Clinical stage from 11/25/2019: Stage IIA (cT3, cN1, cM0, G2, ER+, PR+, HER2+) - Signed by Truitt Merle, MD on 12/03/2019    Malignant neoplasm of upper-outer quadrant of right breast in female, estrogen receptor positive ([place])  11/13/2019 Mammogram    Diagnostic Mammogram 11/13/19  IMPRESSION: -Highly suspicious mass and calcifications in the right breast centered between 9 and 12 o'clock spanning up to 11 cm.  -Multiple masses in the right axilla. One of the masses contains calcifications, denoted as mass number 2 measuring 9 x 11 by 10 mm. Several of these masses do not appear to represent definitive lymph nodes. Others may be small but abnormal appearing lymph nodes. Taken in total, a cluster of masses in the right axilla spans up to 3.7 cm.  -There are fibrocystic changes on the left. There are also some solid-appearing masses. A solid appearing mass at 10:30, 6 cm from the nipple measures 9 x 6 x 6 mm. An adjacent solid mass at 10 o'clock, 6 cm from the nipple measures 8 x 6 by 5 mm. Another solid-appearing mass at 11 o'clock, 1 cm from the nipple measures 5 x 4 by 5 mm. Fibrocystic changes are seen at 10 o'clock, 12 o'clock, and 4 o'clock. Another solid-appearing mass seen at 5 o'clock, 4 cm from the nipple measuring 6 x 3 x 4 mm. There is a single abnormal node in the left axilla with a cortex measuring 5 mm and restriction of the fatty hilum.   11/25/2019 Initial Biopsy   Diagnosis 1. Breast, right, needle core biopsy, upper outer - INVASIVE MAMMARY CARCINOMA - MAMMARY CARCINOMA IN SITU WITH CALCIFICATIONS AND NECROSIS - SEE COMMENT 2. Lymph node, needle/core biopsy, right axilla -  INVASIVE MAMMARY CARCINOMA - SEE COMMENT 3. Lymph node, needle/core biopsy, left axilla - BENIGN LYMPH NODE - NO CARCINOMA IDENTIFIED Microscopic Comment 1. The biopsy material shows an infiltrative proliferation of cells arranged linearly and in small clusters. Based on the biopsy, the carcinoma appears Nottingham grade 2 of 3 and measures 1.2 cm in greatest linear extent. E-cadherin and prognostic markers (ER/PR/ki-67/HER2)are pending and will be reported in an addendum. Dr. Tresa Moore reviewed the case and agrees with the above diagnosis. These results were called to [facility] on November 26, 2019. 2. Definitive nodal tissue is not identified. This biopsy has a slightly different architecture morphology than what is seen in part 1. The carcinoma measures 0.7 cm in greatest dimension. E-cadherin and prognostic markers (ER, PR, Ki-67 and HER-2) are pending and will be reported in an addendum.   11/25/2019 Receptors her2   1. PROGNOSTIC INDICATORS Results: IMMUNOHISTOCHEMICAL AND MORPHOMETRIC ANALYSIS PERFORMED MANUALLY The tumor cells are POSITIVE for Her2 (3+). Estrogen Receptor: 90%, POSITIVE, STRONG STAINING INTENSITY Progesterone Receptor: 20%, POSITIVE, STRONG STAINING INTENSITY Proliferation Marker Ki67: 30%  2. PROGNOSTIC INDICATORS Results: IMMUNOHISTOCHEMICAL AND MORPHOMETRIC ANALYSIS PERFORMED MANUALLY The tumor cells are POSITIVE for Her2 (3+). Estrogen Receptor: 90%, POSITIVE, STRONG STAINING INTENSITY Progesterone Receptor: 25%, POSITIVE, STRONG STAINING INTENSITY Proliferation Marker Ki67: 30%   11/25/2019 Cancer Staging   Staging  form: Breast, AJCC 8th Edition - Clinical stage from 11/25/2019: Stage IIA (cT3, cN1, cM0, G2, ER+, PR+, HER2+) - Signed by Truitt Merle, MD on 12/03/2019    12/01/2019 Initial Diagnosis   Malignant neoplasm of upper-outer quadrant of right breast in female, estrogen receptor positive ([place])   12/10/2019 Imaging   Bone Scan   IMPRESSION: Findings concerning for bony metastatic disease in the left acetabulum region, L3 vertebral body region, and anterolateral right tenth rib.   Increased uptake in several large joints likely is of arthropathic etiology.   12/11/2019 Imaging   CT CAP w contrast  IMPRESSION: Prominent glandular tissue in the central right breast with nipple retraction, likely corresponding to the patient's known right breast neoplasm.   Prominent right axillary nodes measuring up to 8 mm short axis, suspicious for nodal metastases in this clinical context.   3 mm subpleural pulmonary nodule in the right lower lobe, likely benign. However, follow-up CT chest is suggested in 3-6 months.   Multiple hepatic lesions, including a dominant 2.4 cm lesion in segment 6, which is considered indeterminate. Metastasis is not excluded. Consider MRI abdomen with/without contrast for further characterization.   11 mm sclerotic lesion along the left posterior aspect of the T7 vertebral body raises concern for isolated osseous metastasis. Correlate with priors if available. Benign vertebral hemangioma at T11.   12/12/2019 Breast MRI   IMPRESSION: 1. The biopsy proven malignancy in the right breast involves all 4 quadrants and spans up to 9 cm by MRI. There is enhancement within the skin at the level of the nipple.   2. Multiple matted masses are seen in the right axilla, either abnormal metastatic lymph nodes or a combination of masses and metastatic lymph nodes. One of these has been previously biopsied showing invasive mammary carcinoma without definitive nodal tissue.   3.  There is a 1.5 cm Rotter's lymph node on the right.   4. There is markedly abnormal enhancement and edema in the right pectoralis major muscle spanning 6-7 cm, suspicious for metastatic disease.   5. There is diffuse nodular enhancement throughout the left breast, which may correspond with the solid-appearing masses seen  on ultrasound. These all have a similar appearance on MRI.   6.  No findings of left axillary lymphadenopathy.   12/15/2019 Procedure   PAC placed    12/24/2019 Imaging   MRI abdomen  IMPRESSION: 1. Multifocal enhancing bone lesions are noted within the lumbar spine, and bony pelvis compatible with metastatic disease. 2. Four, small peripherally enhancing cystic lesions are noted within the liver worrisome for metastatic disease. 3. 2 enhancing liver lesions are also noted with signal and enhancement characteristics consistent with benign liver hemangioma. 4. Well-circumscribed, enhancing T2 and T1 hypointense structure within the right adnexal region is indeterminate. It is unclear whether not this is arising from the right ovary, broad ligament or right side of uterine fundus. Differential considerations include fibrothecoma, versus pedunculated uterine leiomyoma. Metastatic disease is considered less favored. Attention on follow-up imaging is recommended.     01/03/2020 - 05/27/2020 Chemotherapy   First line THP q3weeks starting 01/03/20-05/27/20   01/05/2020 Imaging   US Abdomen  IMPRESSION: 1. Suspect hematoma in the medial right lobe of the liver in the area of previous biopsy. Note that recent MR does not show lesion in this area. This area has ill-defined margins and measures 3.9 x 4.0 x 3.5 cm. No perihepatic fluid.   2. Probable hemangiomas elsewhere in the right lobe of the  liver. Note that several smaller lesions seen on recent MR are not appreciable by ultrasound.   3.  Study otherwise unremarkable.   01/12/2020 Pathology Results   FINAL MICROSCOPIC DIAGNOSIS:   A. BONE, SCLEROTIC LESION, LEFT ANTERIOR ILIAC SPINE, BIOPSY:  - Metastatic carcinoma, consistent with patient's clinical history of  primary breast carcinoma  - See comment   COMMENT:  Immunohistochemical stains show that the tumor cells are positive for  CK7 and GATA3; and negative for CK20, consistent  with above  interpretation.   PROGNOSTIC INDICATOR RESULTS:  The tumor cells are NEGATIVE for Her2 (0); see comment  Estrogen Receptor: NEGATIVE; see comment  Progesterone Receptor: NEGATIVE; see comment    03/23/2020 Imaging   CT CAP IMPRESSION: 1. Right lower lobe perifissural nodule is slightly more conspicuous on today's study. Close attention on follow-up recommended. 2. Interval progression of segment IV liver lesion, concerning for metastatic progression. The remaining visualized liver lesions are stable to smaller in the interval. 3. Interval evolution in appearance of thoracolumbar spinal lesions and left iliac crest lesion, potentially reflecting response to therapy/healing. No definite new osseous lesions on today's study. 4. New small fluid collection right cul-de-sac. 5. Probable fibroid change in the uterus including exophytic right fundal lesion. 6. Aortic Atherosclerosis (ICD10-I70.0).   03/31/2020 Imaging   MR ABD IMPRESSION: 1. Enlarging hepatic lesion in hepatic subsegment IV suspicious for hepatic metastasis. The it is uncertain whether the change in the short interval is due to technical factors with different modalities or true enlargement. 2. Lesion in the posterior RIGHT hepatic lobe with imaging features that are atypical for hemangioma but stability and signs of enhancement on later phase raising this question. Another more classic appearing may angioma seen inferior to this location in hepatic subsegment VI. Attention on follow-up. 3. Heterogeneous enhancement throughout the spine particularly at L3 as exhibited on the prior study with very patchy marrow signal remains suspicious for bony metastatic lesions in this patient with known bone metastases.   04/01/2020 Imaging   Bone scan IMPRESSION: Good response to therapy with resolution of sites of uptake seen previously at the cervical spine and posterior RIGHT tenth rib as well as a diminished  degree of uptake at remaining previously identified osseous metastatic foci.   04/28/2020 PET scan   IMPRESSION: 1. No hypermetabolic lesions to suggest active metastatic disease involving the liver or osseous structures. 2. Stable small low-attenuation liver lesions, likely treated disease.   06/18/2020 -  Chemotherapy   Given her excellent response, I switched to maintenance therapy with Herceptin, Perjeta (Phesgo) q3weeks and Letrozole.   06/18/2020 -  Chemotherapy   Zometa starting 06/18/20   06/18/2020 -  Anti-estrogen oral therapy   Letrozole 2.60m once dialy.    09/03/2020 Imaging   Bone Scan IMPRESSION: 1. Significant interval improvement compared to previous studies. Only mild uptake remains in the pelvis as above. There has been significant interval improvement over time.  CT C/A/P IMPRESSION: 1. Hypodense liver lesions are slightly decreased in size compared to prior examination, consistent with treatment response of hepatic metastatic disease. 2. Unchanged sclerotic lesion of the anterior left iliac crest. Unchanged lytic superior endplate deformity of L3, which remains equivocal for metastatic lesion versus mechanical Schmorl deformity. No CT evidence of new osseous metastatic disease. 3. No evidence of new metastatic disease in the chest, abdomen, or pelvis. 4. Coronary artery disease.      INTERVAL HISTORY:  Carolyn Nunleyis here for a follow up of metastatic breast cancer.  She was last seen by me on 01/28/21. She presents to the clinic alone. She reports feeling well overall-- "I can't complain." She notes fatigue sometimes, but she attributes this to her work. She has lost some weight, which she reports is intentional. She notes she had issues with her phone and was without it for about 3 weeks. This caused a delay in some things for her, such as prescription refill. She notes she has a new phone now that is working fine.   All other systems were reviewed with  the patient and are negative.  MEDICAL HISTORY:  Past Medical History:  Diagnosis Date   Cancer ([place]) 2021   Hypertension     SURGICAL HISTORY: Past Surgical History:  Procedure Laterality Date   left parotid tumor removal   2006   PORTACATH PLACEMENT Left 12/15/2019   Procedure: INSERTION PORT-A-CATH WITH ULTRASOUND GUIDANCE;  Surgeon: [name], MD;  Location: [facility];  Service: General;  Laterality: Left;   TONSILLECTOMY      I have reviewed the social history and family history with the patient and they are unchanged from previous note.  ALLERGIES:  has No Known Allergies.  MEDICATIONS:  Current Outpatient Medications  Medication Sig Dispense Refill   acetaminophen (TYLENOL) 500 MG tablet Take 1,000 mg by mouth every 6 (six) hours as needed for moderate pain or headache.     amLODipine (NORVASC) 10 MG tablet Take 1 tablet (10 mg total) by mouth daily. 90 tablet 3   Cholecalciferol (VITAMIN D3) 50 MCG (2000 UT) TABS Take 2,000 Units by mouth daily.      letrozole (FEMARA) 2.5 MG tablet Take 1 tablet (2.5 mg total) by mouth daily. 90 tablet 1   lidocaine-prilocaine (EMLA) cream Apply 1 application topically as needed. 30 g 1   losartan (COZAAR) 50 MG tablet Take 1 tablet (50 mg total) by mouth daily. 90 tablet 3   metoprolol succinate (TOPROL-XL) 50 MG 24 hr tablet Take 1 tablet (50 mg total) by mouth daily. 90 tablet 3   omeprazole (PRILOSEC OTC) 20 MG tablet Take 20 mg by mouth daily.     potassium chloride SA (KLOR-CON M20) 20 MEQ tablet Take 1 tablet (20 mEq total) by mouth 2 (two) times daily. 90 tablet 1   No current facility-administered medications for this visit.    PHYSICAL EXAMINATION: ECOG PERFORMANCE STATUS: 0 - Asymptomatic  Vitals:   03/11/21 1123  BP: (!) 152/91  Pulse: 84  Resp: 18  Temp: 98.2 F (36.8 C)  SpO2: 97%   Wt Readings from Last 3 Encounters:  03/11/21 192 lb 6.4 oz (87.3 kg)  02/18/21 198 lb 12.8 oz (90.2 kg)  01/28/21 195 lb 4.8 oz  (88.6 kg)     GENERAL:alert, no distress and comfortable SKIN: skin color, texture, turgor are normal, no rashes or significant lesions EYES: normal, Conjunctiva are pink and non-injected, sclera clear  NECK: supple, thyroid normal size, non-tender, without nodularity LYMPH:  no palpable lymphadenopathy in the cervical, axillary  LUNGS: clear to auscultation and percussion with normal breathing effort HEART: regular rate & rhythm and no murmurs and no lower extremity edema ABDOMEN:abdomen soft, non-tender and normal bowel sounds Musculoskeletal:no cyanosis of digits and no clubbing  NEURO: alert & oriented x 3 with fluent speech, no focal motor/sensory deficits BREAST:  No palpable mass, nodules or adenopathy bilaterally. Breast exam benign.   LABORATORY DATA:  I have reviewed the data as listed CBC Latest Ref Rng & Units 03/11/2021 02/18/2021  01/28/2021  WBC 4.0 - 10.5 Carolyn/uL 6.0 5.4 5.0  Hemoglobin 12.0 - 15.0 g/dL 13.1 13.1 12.5  Hematocrit 36.0 - 46.0 % 39.0 39.8 38.3  Platelets 150 - 400 Carolyn/uL 285 275 285     CMP Latest Ref Rng & Units 03/11/2021 02/18/2021 01/28/2021  Glucose 70 - 99 mg/dL 86 120(H) 108(H)  BUN 6 - 20 mg/dL '15 13 12  ' Creatinine 0.44 - 1.00 mg/dL 0.76 0.78 0.84  Sodium 135 - 145 mmol/L 141 142 142  Potassium 3.5 - 5.1 mmol/L 3.7 3.2(L) 3.4(L)  Chloride 98 - 111 mmol/L 110 109 110  CO2 22 - 32 mmol/L '24 26 26  ' Calcium 8.9 - 10.3 mg/dL 9.0 9.4 9.1  Total Protein 6.5 - 8.1 g/dL 7.4 7.2 6.9  Total Bilirubin 0.3 - 1.2 mg/dL 0.3 0.6 0.4  Alkaline Phos 38 - 126 U/L 93 78 82  AST 15 - 41 U/L 19 13(L) 13(L)  ALT 0 - 44 U/L '10 8 7      ' RADIOGRAPHIC STUDIES: I have personally reviewed the radiological images as listed and agreed with the findings in the report. No results found.    No orders of the defined types were placed in this encounter.  All questions were answered. The patient knows to call the clinic with any problems, questions or concerns. No barriers  to learning was detected. The total time spent in the appointment was 30 minutes.     Truitt Merle, MD 03/11/2021   I, Wilburn Mylar, am acting as scribe for Truitt Merle, MD.   I have reviewed the above documentation for accuracy and completeness, and I agree with the above.

## 2021-03-11 NOTE — Patient Instructions (Signed)
[facility]  Discharge Instructions: Thank you for choosing [facility] to provide your oncology and hematology care.   If you have a lab appointment with the [facility], please go directly to the [facility] and check in at the registration area.   Wear comfortable clothing and clothing appropriate for easy access to any Portacath or PICC line.   We strive to give you quality time with your provider. You may need to reschedule your appointment if you arrive late (15 or more minutes).  Arriving late affects you and other patients whose appointments are after yours.  Also, if you miss three or more appointments without notifying the office, you may be dismissed from the clinic at the provider's discretion.      For prescription refill requests, have your pharmacy contact our office and allow 72 hours for refills to be completed.    Today you received the following chemotherapy and/or immunotherapy agents Phesgo      To help prevent nausea and vomiting after your treatment, we encourage you to take your nausea medication as directed.  BELOW ARE SYMPTOMS THAT SHOULD BE REPORTED IMMEDIATELY: *FEVER GREATER THAN 100.4 F (38 C) OR HIGHER *CHILLS OR SWEATING *NAUSEA AND VOMITING THAT IS NOT CONTROLLED WITH YOUR NAUSEA MEDICATION *UNUSUAL SHORTNESS OF BREATH *UNUSUAL BRUISING OR BLEEDING *URINARY PROBLEMS (pain or burning when urinating, or frequent urination) *BOWEL PROBLEMS (unusual diarrhea, constipation, pain near the anus) TENDERNESS IN MOUTH AND THROAT WITH OR WITHOUT PRESENCE OF ULCERS (sore throat, sores in mouth, or a toothache) UNUSUAL RASH, SWELLING OR PAIN  UNUSUAL VAGINAL DISCHARGE OR ITCHING   Items with * indicate a potential emergency and should be followed up as soon as possible or go to the Emergency Department if any problems should occur.  Please show the CHEMOTHERAPY ALERT CARD or IMMUNOTHERAPY ALERT CARD at check-in to the  Emergency Department and triage nurse.  Should you have questions after your visit or need to cancel or reschedule your appointment, please contact [facility]  Dept: [phone]  and follow the prompts.  Office hours are 8:00 a.m. to 4:30 p.m. Monday - Friday. Please note that voicemails left after 4:00 p.m. may not be returned until the following business day.  We are closed weekends and major holidays. You have access to a nurse at all times for urgent questions. Please call the main number to the clinic Dept: [phone] and follow the prompts.   For any non-urgent questions, you may also contact your provider using MyChart. We now offer e-Visits for anyone 6 and older to request care online for non-urgent symptoms. For details visit [email or web address].   Also download the MyChart app! Go to the app store, search "MyChart", open the app, select [facility], and log in with your MyChart username and password.  Due to Covid, a mask is required upon entering the hospital/clinic. If you do not have a mask, one will be given to you upon arrival. For doctor visits, patients may have 1 support person aged 8 or older with them. For treatment visits, patients cannot have anyone with them due to current Covid guidelines and our immunocompromised population.

## 2021-03-15 ENCOUNTER — Telehealth: Payer: Self-pay | Admitting: Hematology

## 2021-03-15 NOTE — Telephone Encounter (Signed)
Left message with follow-up appointment per 11/18 los. Sent scheduling message to DB.

## 2021-03-16 ENCOUNTER — Ambulatory Visit (HOSPITAL_COMMUNITY)
Admission: RE | Admit: 2021-03-16 | Discharge: 2021-03-16 | Disposition: A | Discharge status: Home / Self Care | Payer: Self-pay | Source: Ambulatory Visit | Attending: Internal Medicine | Admitting: Internal Medicine

## 2021-03-16 ENCOUNTER — Telehealth: Payer: Self-pay | Admitting: Hematology

## 2021-03-16 VITALS — BP 149/86 | HR 80

## 2021-03-16 DIAGNOSIS — C50919 Malignant neoplasm of unspecified site of unspecified female breast: Secondary | ICD-10-CM

## 2021-03-16 DIAGNOSIS — I427 Cardiomyopathy due to drug and external agent: Secondary | ICD-10-CM

## 2021-03-16 DIAGNOSIS — I10 Essential (primary) hypertension: Secondary | ICD-10-CM

## 2021-03-16 DIAGNOSIS — T451X5A Adverse effect of antineoplastic and immunosuppressive drugs, initial encounter: Secondary | ICD-10-CM

## 2021-03-16 DIAGNOSIS — C50911 Malignant neoplasm of unspecified site of right female breast: Secondary | ICD-10-CM | POA: Insufficient documentation

## 2021-03-16 DIAGNOSIS — C7951 Secondary malignant neoplasm of bone: Secondary | ICD-10-CM | POA: Insufficient documentation

## 2021-03-16 MED ORDER — LOSARTAN POTASSIUM 100 MG PO TABS
100.0000 mg | ORAL_TABLET | Freq: Every day | ORAL | 6 refills | Status: DC
Start: 2021-03-30 — End: 2022-01-10

## 2021-03-16 MED ORDER — METOPROLOL SUCCINATE ER 100 MG PO TB24
100.0000 mg | ORAL_TABLET | Freq: Every day | ORAL | 6 refills | Status: DC
Start: 2021-03-16 — End: 2022-01-26

## 2021-03-16 NOTE — Patient Instructions (Signed)
Increase Metoprolol XL to 100 mg Daily  In 2 weeks (03/30/21) Increase Losartan to 100 mg Daily  Your physician recommends that you schedule a follow-up appointment in: 3 weeks with echocardiogram  If you have any questions or concerns before your next appointment please send [place] a message through [place] or call our office at [phone].    TO LEAVE A MESSAGE FOR THE NURSE SELECT OPTION 2, PLEASE LEAVE A MESSAGE INCLUDING: YOUR NAME DATE OF BIRTH CALL BACK NUMBER REASON FOR CALL**this is important as we prioritize the call backs  YOU WILL RECEIVE A CALL BACK THE SAME DAY AS LONG AS YOU CALL BEFORE 4:00 PM  At the [facility], you and your health needs are our priority. As part of our continuing mission to provide you with exceptional heart care, we have created designated Provider Care Teams. These Care Teams include your primary Cardiologist (physician) and Advanced Practice Providers (APPs- Physician Assistants and Nurse Practitioners) who all work together to provide you with the care you need, when you need it.   You may see any of the following providers on your designated Care Team at your next follow up: Dr Glori Bickers Dr Haynes Kerns, NP Lyda Jester, [place] [facility] [place], [place] Audry Riles, PharmD   Please be sure to bring in all your medications bottles to every appointment.

## 2021-03-16 NOTE — Telephone Encounter (Signed)
Scheduled appointment per 11/22 inbasket. Patient aware.

## 2021-03-16 NOTE — Addendum Note (Signed)
Encounter addended by: Scarlette Calico, RN on: 03/16/2021 10:53 AM  Actions taken: Pharmacy for encounter modified, Order list changed, Diagnosis association updated, Clinical Note Signed

## 2021-03-16 NOTE — Progress Notes (Signed)
CARDIO-ONCOLOGY NOTE  Referring Physician: Dr. Burr Medico Primary Care: None HF Cardiologist: Dr. Haroldine Laws  HPI:  Carolyn Stewart is 60 y.o. female with HTN and stage IV right breast cancer referred by Dr. Burr Medico for enrollment into the Cardio-Oncology program due to reduced EF on echo.   Diagnosed with R breast cancer in 8/21 (triple positive) found to have mets to bone. She received THP 01/03/20-05/27/20. Her restaging PET scan from 04/28/20 showed no hypermetabolic lesions. Given her excellent response, she was switched to maintenance therapy with Herceptin, Perjeta q3weeks and Letrozole starting 06/18/20 which is still ongoing.  Zometa started 06/18/20  In June had echo which showed EF 50-55% so H/P held. Losartan started.   Had covid in July. Received Paxlovid and recovered without need for hospital admission.  Echo 07/22 with EF 55-60%. Herceptin restarted.  Echo 9/22 EF 55% felt to be slightly down from before but still in normal range  Echo 03/09/21 EF 55%  She is here today for 2 month follow-up. Feeling well. Tolerating Herceptin very well Denies SOB, orthopnea or PND.   ECHO 07/22: EF 55-60% GLS 16 ECHO  09/22/20: EF 40-45%  (I felt 50-55%) ECHO 12/21: EF 60-65% GLS -18.2 ECHO 8/21: EF 60-65% G1DD   Review of Systems: Negative except as mentioned in HPI  Past Medical History:  Diagnosis Date   Cancer ([place]) 2021   Hypertension     Current Outpatient Medications  Medication Sig Dispense Refill   acetaminophen (TYLENOL) 500 MG tablet Take 1,000 mg by mouth every 6 (six) hours as needed for moderate pain or headache.     amLODipine (NORVASC) 10 MG tablet Take 1 tablet (10 mg total) by mouth daily. 90 tablet 3   Cholecalciferol (VITAMIN D3) 50 MCG (2000 UT) TABS Take 2,000 Units by mouth daily.      letrozole (FEMARA) 2.5 MG tablet Take 1 tablet (2.5 mg total) by mouth daily. 90 tablet 1   lidocaine-prilocaine (EMLA) cream Apply 1 application topically as needed. 30 g 1   losartan  (COZAAR) 50 MG tablet Take 1 tablet (50 mg total) by mouth daily. 90 tablet 3   metoprolol succinate (TOPROL-XL) 50 MG 24 hr tablet Take 1 tablet (50 mg total) by mouth daily. 90 tablet 3   omeprazole (PRILOSEC OTC) 20 MG tablet Take 20 mg by mouth daily.     potassium chloride SA (KLOR-CON M20) 20 MEQ tablet Take 1 tablet (20 mEq total) by mouth 2 (two) times daily. 90 tablet 1   No current facility-administered medications for this encounter.    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   Tobacco Use   Smoking status: Never   Smokeless tobacco: Never  Vaping Use   Vaping Use: Never used  Substance and Sexual Activity   Alcohol use: Yes    Comment: social drinker    Drug use: No   Sexual activity: Yes  Other Topics Concern   Not on file  Social History Narrative   Not on file   Social Determinants of Health   Financial Resource Strain: Not on file  Food Insecurity: Not on file  Transportation Needs: Not on file  Physical Activity: Not on file  Stress:  Not on file  Social Connections: Not on file  Intimate Partner Violence: Not on file      Family History  Problem Relation Age of Onset   Hypertension Mother    Hypertension Father     Vitals:   03/16/21 1040  BP: (!) 149/86  Pulse: 80  SpO2: 98%    General:  Well appearing. No resp difficulty HEENT: normal Neck: supple. no JVD. Carotids 2+ bilat; no bruits. No lymphadenopathy or thryomegaly appreciated. Cor: PMI nondisplaced. Regular rate & rhythm. No rubs, gallops or murmurs. Lungs: clear Abdomen: soft, nontender, nondistended. No hepatosplenomegaly. No bruits or masses. Good bowel sounds. Extremities: no cyanosis, clubbing, rash, edema Neuro: alert & orientedx3, cranial  nerves grossly intact. moves all 4 extremities w/o difficulty. Affect pleasant   ASSESSMENT & PLAN: 1.  Chemotherapy-induced CM - ECHO 8/21: EF 60-65% G1DD - ECHO 12/21: EF 60-65% GLS -18.2 - ECHO  09/22/20: EF 40-45%  (I felt 50-55%) - ECHO 07/22: EF 55-60% GLS 16 - ECHO 9/22 F 55%. LV function appeared less brisk than on prior echo in July. I discussed with Dr. Burr Medico. Will continue herceptin q 3 weeks for now and recheck echo in 2 months. - Herceptin held in June d/t mild cardiomyopathy. Restarted in August. - Echo 03/09/21 EF 55% (completely stable) - Increase losartan to 100 mg daily. If tolerating increase Toprol to 100 daily in 2 weeks - F/u 3 months with echo. Continue Herceptin   2. Stage IV Right Breast Cancer (triple positive) - Diagnosed with R breast cancer in 8/21 (triple positive) found to have mets to bone. She received THP 01/03/20-05/27/20. Her restaging PET scan from 04/28/20 showed no hypermetabolic lesions. Given her excellent response, she was switched to maintenance therapy with Herceptin, Perjeta q3weeks and Letrozole starting 06/18/20.  Zometa started 06/18/20, will continue every 3 months. - Herceptin held 09/22/20 due to reduced EF, restarted in August as above.  - Plan as above  3. HTN - BP elevated in clinic - Home readings are well controlled - Plan as Elenora Fender, MD  10:40 AM

## 2021-04-01 ENCOUNTER — Inpatient Hospital Stay: Payer: Self-pay | Attending: Hematology

## 2021-04-01 ENCOUNTER — Other Ambulatory Visit: Payer: Self-pay

## 2021-04-01 VITALS — BP 123/72 | HR 81 | Temp 98.0°F | Resp 18 | Ht 67.0 in | Wt 191.0 lb

## 2021-04-01 DIAGNOSIS — Z5111 Encounter for antineoplastic chemotherapy: Secondary | ICD-10-CM | POA: Insufficient documentation

## 2021-04-01 DIAGNOSIS — Z170 Estrogen receptor positive status [ER+]: Secondary | ICD-10-CM | POA: Insufficient documentation

## 2021-04-01 DIAGNOSIS — C7951 Secondary malignant neoplasm of bone: Secondary | ICD-10-CM | POA: Insufficient documentation

## 2021-04-01 DIAGNOSIS — C50411 Malignant neoplasm of upper-outer quadrant of right female breast: Secondary | ICD-10-CM | POA: Insufficient documentation

## 2021-04-01 MED ORDER — DIPHENHYDRAMINE HCL 25 MG PO CAPS
50.0000 mg | ORAL_CAPSULE | Freq: Once | ORAL | Status: AC
  Administered 2021-04-01: 50 mg via ORAL
  Filled 2021-04-01: qty 2

## 2021-04-01 MED ORDER — ACETAMINOPHEN 325 MG PO TABS
650.0000 mg | ORAL_TABLET | Freq: Once | ORAL | Status: AC
  Administered 2021-04-01: 650 mg via ORAL
  Filled 2021-04-01: qty 2

## 2021-04-01 MED ORDER — PERTUZ-TRASTUZ-HYALURON-ZZXF 60-60-2000 MG-MG-U/ML CHEMO ~~LOC~~ SOLN
10.0000 mL | Freq: Once | SUBCUTANEOUS | Status: AC
  Administered 2021-04-01: 10 mL via SUBCUTANEOUS
  Filled 2021-04-01: qty 10

## 2021-04-01 NOTE — Progress Notes (Signed)
Patient observed for at least 15 minutes post PHESGO Injection with no concerns.

## 2021-04-01 NOTE — Progress Notes (Signed)
Patient presents for treatment. RN assessment completed along with the following:  Labs/vitals reviewed - Yes, and within treatment parameters.   Weight within 10% of previous measurement - Yes Oncology Treatment Attestation completed for current therapy- Yes, on date 05/27/20 Informed consent completed and reflects current therapy/intent - Yes, on date 06/18/20             Provider progress note reviewed - Patient not seen by provider today. Most recent note dated 03/11/21 reviewed. Treatment/Antibody/Supportive plan reviewed - Yes, and there are no adjustments needed for today's treatment. S&H and other orders reviewed - Yes, and there are no additional orders identified. Previous treatment date reviewed - Yes, and the appropriate amount of time has elapsed between treatments.  Patient to proceed with treatment.

## 2021-04-01 NOTE — Patient Instructions (Addendum)
Carolyn Stewart  Discharge Instructions: Thank you for choosing [facility] to provide your oncology and hematology care.   If you have a lab appointment with the [facility], please go directly to the [facility] and check in at the registration area.   Wear comfortable clothing and clothing appropriate for easy access to any Portacath or PICC line.   We strive to give you quality time with your provider. You may need to reschedule your appointment if you arrive late (15 or more minutes).  Arriving late affects you and other patients whose appointments are after yours.  Also, if you miss three or more appointments without notifying the office, you may be dismissed from the clinic at the provider's discretion.      For prescription refill requests, have your pharmacy contact our office and allow 72 hours for refills to be completed.    Today you received the following chemotherapy and/or immunotherapy agents P[place]go      To help prevent nausea and vomiting after your treatment, we encourage you to take your nausea medication as directed.  BELOW ARE SYMPTOMS THAT SHOULD BE REPORTED IMMEDIATELY: *FEVER GREATER THAN 100.4 F (38 C) OR HIGHER *CHILLS OR SWEATING *NAUSEA AND VOMITING THAT IS NOT CONTROLLED WITH YOUR NAUSEA MEDICATION *UNUSUAL SHORTNESS OF BREATH *UNUSUAL BRUISING OR BLEEDING *URINARY PROBLEMS (pain or burning when urinating, or frequent urination) *BOWEL PROBLEMS (unusual diarrhea, constipation, pain near the anus) TENDERNESS IN MOUTH AND THROAT WITH OR WITHOUT PRESENCE OF ULCERS (sore throat, sores in mouth, or a toothache) UNUSUAL RASH, SWELLING OR PAIN  UNUSUAL VAGINAL DISCHARGE OR ITCHING   Items with * indicate a potential emergency and should be followed up as soon as possible or go to the Emergency Department if any problems should occur.  Please show the CHEMOTHERAPY ALERT CARD or IMMUNOTHERAPY ALERT CARD at check-in to the  Emergency Department and triage nurse.  Should you have questions after your visit or need to cancel or reschedule your appointment, please contact [facility]  Dept: [phone]  and follow the prompts.  Office hours are 8:00 a.m. to 4:30 p.m. Monday - Friday. Please note that voicemails left after 4:00 p.m. may not be returned until the following business day.  We are closed weekends and major holidays. You have access to a nurse at all times for urgent questions. Please call the main number to the clinic Dept: [phone] and follow the prompts.  Pertuzumab; Trastuzumab; Hyaluronidase Injection What is this medication? PERTUZUMAB (per TOOZ ue mab); TRASTUZUMAB (tras TOO zoo mab); HYALURONIDASE (hye al ur ON i dase) is used to treat breast cancer. Pertuzumab and trastuzumab are a monoclonal antibodies. Hyaluronidase is used to improve the effects of pertuzumab and trastuzumab. This medicine may be used for other purposes; ask your health care provider or pharmacist if you have questions. COMMON BRAND NAME(S): PHESGO What should I tell my care team before I take this medication? They need to know if you have any of these conditions: heart disease high blood pressure history of irregular heartbeat lung or breathing disease, like asthma an unusual or allergic reaction to pertuzumab, trastuzumab, hyaluronidase, other medicines, foods, dyes, or preservatives pregnant or trying to get pregnant breast-feeding How should I use this medication? This medicine is for injection under the skin. It may be given by a health care professional in a hospital or clinic setting or a health care professional may give you this medicine at home.  Talk to your pediatrician regarding the use of this medicine in children. Special care may be needed. Overdosage: If you think you have taken too much of this medicine contact a poison control center or emergency room at once. NOTE: This  medicine is only for you. Do not share this medicine with others. What if I miss a dose? It is important not to miss your dose. Call your doctor or health care professional if you are unable to keep an appointment. What may interact with this medication? This medicine may interact with the following medications: certain types of chemotherapy, such as daunorubicin, doxorubicin, epirubicin, and idarubicin This list may not describe all possible interactions. Give your health care provider a list of all the medicines, herbs, non-prescription drugs, or dietary supplements you use. Also tell them if you smoke, drink alcohol, or use illegal drugs. Some items may interact with your medicine. What should I watch for while using this medication? Visit your health care professional for regular checks on your progress. Tell your health care professional if your symptoms do not start to get better or if they get worse. Your condition will be monitored carefully while you are receiving this medicine. Do not become pregnant while taking this medicine or for 7 months after stopping it. Women should inform their doctor if they wish to become pregnant or think they might be pregnant. There is a potential for serious side effects to an unborn child. Talk to your health care professional or pharmacist for more information. What side effects may I notice from receiving this medication? Side effects that you should report to your doctor or health care professional as soon as possible: allergic reactions like skin rash, itching or hives, swelling of the face, lips, or tongue breathing problems chest pain cough dizziness feeling faint; lightheaded, falls fever or chills loss of consciousness pain, tingling, numbness in the hands or feet palpitations sudden weight gain swelling of the ankles or legs unusually weak or tired Side effects that usually do not require medical attention (report these to your doctor or  health care professional if they continue or are bothersome): diarrhea hair loss joint pain muscle pain nausea, vomiting pain, redness, or irritation at site where injected tiredness This list may not describe all possible side effects. Call your doctor for medical advice about side effects. You may report side effects to FDA at 1-800-FDA-1088. Where should I keep my medication? Keep out of the reach of children. If you are using this medicine at home, you will be instructed on how to store it. Throw away any unused medicine after the expiration date on the label. NOTE: This sheet is a summary. It may not cover all possible information. If you have questions about this medicine, talk to your doctor, pharmacist, or health care provider.  2022 Elsevier/Gold Standard (2020-12-28 00:00:00)   For any non-urgent questions, you may also contact your provider using MyChart. We now offer e-Visits for anyone 50 and older to request care online for non-urgent symptoms. For details visit [email or web address].   Also download the MyChart app! Go to the app store, search "MyChart", open the app, select [facility], and log in with your MyChart username and password.  Due to Covid, a mask is required upon entering the hospital/clinic. If you do not have a mask, one will be given to you upon arrival. For doctor visits, patients may have 1 support person aged 56 or older with them. For treatment visits, patients cannot have anyone with  them due to current Covid guidelines and our immunocompromised population.

## 2021-04-22 ENCOUNTER — Inpatient Hospital Stay: Payer: Self-pay

## 2021-04-22 ENCOUNTER — Other Ambulatory Visit: Payer: Self-pay

## 2021-04-22 ENCOUNTER — Telehealth: Payer: Self-pay | Admitting: Hematology

## 2021-04-22 ENCOUNTER — Encounter: Payer: Self-pay | Admitting: Hematology

## 2021-04-22 ENCOUNTER — Inpatient Hospital Stay (HOSPITAL_BASED_OUTPATIENT_CLINIC_OR_DEPARTMENT_OTHER): Payer: Self-pay | Admitting: Hematology

## 2021-04-22 VITALS — BP 134/75 | HR 82 | Temp 98.4°F | Resp 18 | Ht 67.0 in | Wt 193.8 lb

## 2021-04-22 DIAGNOSIS — Z170 Estrogen receptor positive status [ER+]: Secondary | ICD-10-CM

## 2021-04-22 DIAGNOSIS — C50411 Malignant neoplasm of upper-outer quadrant of right female breast: Secondary | ICD-10-CM

## 2021-04-22 LAB — CBC WITH DIFFERENTIAL (CANCER CENTER ONLY)
Abs Immature Granulocytes: 0.02 10*3/uL (ref 0.00–0.07)
Basophils Absolute: 0 10*3/uL (ref 0.0–0.1)
Basophils Relative: 0 %
Eosinophils Absolute: 0.3 10*3/uL (ref 0.0–0.5)
Eosinophils Relative: 4 %
HCT: 36.4 % (ref 36.0–46.0)
Hemoglobin: 12.1 g/dL (ref 12.0–15.0)
Immature Granulocytes: 0 %
Lymphocytes Relative: 37 %
Lymphs Abs: 2.6 10*3/uL (ref 0.7–4.0)
MCH: 29.8 pg (ref 26.0–34.0)
MCHC: 33.2 g/dL (ref 30.0–36.0)
MCV: 89.7 fL (ref 80.0–100.0)
Monocytes Absolute: 0.6 10*3/uL (ref 0.1–1.0)
Monocytes Relative: 8 %
Neutro Abs: 3.6 10*3/uL (ref 1.7–7.7)
Neutrophils Relative %: 51 %
Platelet Count: 260 10*3/uL (ref 150–400)
RBC: 4.06 MIL/uL (ref 3.87–5.11)
RDW: 13.3 % (ref 11.5–15.5)
WBC Count: 7.2 10*3/uL (ref 4.0–10.5)
nRBC: 0 % (ref 0.0–0.2)

## 2021-04-22 LAB — CMP (CANCER CENTER ONLY)
ALT: 10 U/L (ref 0–44)
AST: 15 U/L (ref 15–41)
Albumin: 4 g/dL (ref 3.5–5.0)
Alkaline Phosphatase: 79 U/L (ref 38–126)
Anion gap: 5 (ref 5–15)
BUN: 14 mg/dL (ref 6–20)
CO2: 28 mmol/L (ref 22–32)
Calcium: 9.2 mg/dL (ref 8.9–10.3)
Chloride: 107 mmol/L (ref 98–111)
Creatinine: 0.73 mg/dL (ref 0.44–1.00)
GFR, Estimated: >60 mL/min (ref >60)
Glucose, Bld: 115 mg/dL — ABNORMAL HIGH (ref 70–99)
Potassium: 3.7 mmol/L (ref 3.5–5.1)
Sodium: 140 mmol/L (ref 135–145)
Total Bilirubin: 0.4 mg/dL (ref 0.3–1.2)
Total Protein: 7 g/dL (ref 6.5–8.1)

## 2021-04-22 MED ORDER — PERTUZ-TRASTUZ-HYALURON-ZZXF 60-60-2000 MG-MG-U/ML CHEMO ~~LOC~~ SOLN
10.0000 mL | Freq: Once | SUBCUTANEOUS | Status: AC
  Administered 2021-04-22: 10 mL via SUBCUTANEOUS
  Filled 2021-04-22: qty 10

## 2021-04-22 MED ORDER — SODIUM CHLORIDE 0.9% FLUSH
10.0000 mL | Freq: Once | INTRAVENOUS | Status: AC | PRN
  Administered 2021-04-22: 12:00:00 10 mL

## 2021-04-22 MED ORDER — DIPHENHYDRAMINE HCL 25 MG PO CAPS
50.0000 mg | ORAL_CAPSULE | Freq: Once | ORAL | Status: AC
  Administered 2021-04-22: 13:00:00 50 mg via ORAL
  Filled 2021-04-22: qty 2

## 2021-04-22 MED ORDER — ACETAMINOPHEN 325 MG PO TABS
650.0000 mg | ORAL_TABLET | Freq: Once | ORAL | Status: AC
  Administered 2021-04-22: 13:00:00 650 mg via ORAL
  Filled 2021-04-22: qty 2

## 2021-04-22 NOTE — Progress Notes (Signed)
Carolyn Stewart   [phone]) [phone] [phone]   Clinic Follow up Note   Patient Care Team: Julian Hy, PA-C as PCP - General (Physician Assistant) [name], MD as Consulting Physician (General Surgery) Truitt Merle, MD as Consulting Physician (Hematology) Kyung Rudd, MD as Consulting Physician (Radiation Oncology) Mauro Kaufmann, RN as Oncology Nurse Navigator Rockwell Germany, RN as Oncology Nurse Navigator  Date of Service:  04/22/2021  CHIEF COMPLAINT: f/u of metastatic breast cancer  CURRENT THERAPY:  -Maintenance Herceptin and Perjeta (Phesgo) q3weeks starting 06/18/20, held 09/22/20-11/24/20 due to low EF -Letrozole 2.72m daily starting 06/19/23 -Zometa starting 06/18/20  ASSESSMENT & PLAN:  Carolyn Stewart a 60y.o. [place] with   1. Malignant neoplasm of upper-outer quadrant of right breast, ductal carcinoma, Stage IIA, c(T3N1Mx), with bone and possible liver mets,  ER+/PR+/HER2+, Grade II-III  -She was diagnosed in 11/2019 with a large right breast mass in 4 quadrants measuring up to 11cm and right lymphadenopathy. Her right breast and LN biopsy was positive for invasive ductal carcinoma with components of DCIS. Her left axillary node biopsy was benign.  -01/01/20 Liver biopsy was negative for malignant cells but her 01/12/20 Bone Biopsy was positive for metastatic carcinoma from primary breast cancer. Her ER/PR/HER2 were all negative bone biopsy, possibly related to decalcification. -She was also seen to have left breast mass on her 12/12/19 MRI -She received THP 01/03/20-05/27/20. Her restaging PET scan from 04/28/20 showed no hypermetabolic lesions. Given her excellent response, I switched to maintenance therapy with Herceptin, Perjeta (Phesgo) q3weeks and Letrozole starting 06/18/20.   -Restaging CT and bone scan on 09/03/20 showed interval treatment response, both to osseous metastases and liver lesions. Will continue current maintenance therapy.   -Echo in 09/2020 showed dropped EF 45-50%, treatment held temporarily. Repeat echo on 03/09/21 showed stable EF 55%, will continue Phesgo -labs reviewed, CBC and CMP WNL, adequate for treatment. She continues to tolerate well with no side effects. -Continue Phesgo injection every 3 weeks, follow-up in 9 weeks or sooner with scan  -she wants to delay her restaging CT scan until she regains her insurance in Jan 2023. She will call [place] that updates.   2. Bone metastases from breast cancer -bone scan on 12/10/19 showed metastatic disease in left acetabulum, L3, and right 10th rib. -01/12/20 Bone Biopsy was positive for metastatic carcinoma from primary breast cancer. -PET on 04/28/20 after THP treatment showed no active disease. -Zometa started 06/18/20, will continue every 3 months, but will hold it for now due to lack of insurance coverage.  I encouraged her to keep up with dental care. She has no active dental issues. She is on calcium and VitD -most recent bone scan on 09/03/20 showed continued improvement.   3. Social Support -she lost insurance in 11/2020. -we are postponing her restaging scan until she has insurance again. I am comfortable with this given her normal tumor marker and good clinical disposition. -I will refer her to our financial assistance team for aid.   4. Chemotherapy-induced cardiomyopathy -echo 09/22/20 showed decreased EF -She was started on losartan by Dr. BHaroldine Lawson 10/21/20 -repeat echo on 03/09/21 was stable.    5. HTN -Well controlled on Metoprolol and Amlodipine.    6. Covid-19 (+) 11/06/20 -She experienced cough, chest congestion, bad headaches, and night fevers. She was prescribed paxlovid and recovered well     PLAN:  -proceed with Phesgo injection today -continue letrozole -Phesgo every 3 weeks             -  she prefers [facility] for appointment on days she doesn't see me             -she prefers Friday appointments -lab and f/u in 9 weeks -will  postpone her restaging scan to Jan 2023 when she has insurance coverage again    No problem-specific Assessment & Plan notes found for this encounter.   SUMMARY OF ONCOLOGIC HISTORY: Oncology History Overview Note  Cancer Staging Malignant neoplasm of upper-outer quadrant of right breast in [place], estrogen receptor positive ([place]) Staging form: Breast, AJCC 8th Edition - Clinical stage from 11/25/2019: Stage IIA (cT3, cN1, cM0, G2, ER+, PR+, HER2+) - Signed by Truitt Merle, MD on 12/03/2019    Malignant neoplasm of upper-outer quadrant of right breast in [place], estrogen receptor positive ([place])  11/13/2019 Mammogram    Diagnostic Mammogram 11/13/19  IMPRESSION: -Highly suspicious mass and calcifications in the right breast centered between 9 and 12 o'clock spanning up to 11 cm.  -Multiple masses in the right axilla. One of the masses contains calcifications, denoted as mass number 2 measuring 9 x 11 by 10 mm. Several of these masses do not appear to represent definitive lymph nodes. Others may be small but abnormal appearing lymph nodes. Taken in total, a cluster of masses in the right axilla spans up to 3.7 cm.  -There are fibrocystic changes on the left. There are also some solid-appearing masses. A solid appearing mass at 10:30, 6 cm from the nipple measures 9 x 6 x 6 mm. An adjacent solid mass at 10 o'clock, 6 cm from the nipple measures 8 x 6 by 5 mm. Another solid-appearing mass at 11 o'clock, 1 cm from the nipple measures 5 x 4 by 5 mm. Fibrocystic changes are seen at 10 o'clock, 12 o'clock, and 4 o'clock. Another solid-appearing mass seen at 5 o'clock, 4 cm from the nipple measuring 6 x 3 x 4 mm. There is a single abnormal node in the left axilla with a cortex measuring 5 mm and restriction of the fatty hilum.   11/25/2019 Initial Biopsy   Diagnosis 1. Breast, right, needle core biopsy, upper outer - INVASIVE MAMMARY CARCINOMA - MAMMARY CARCINOMA IN SITU WITH CALCIFICATIONS AND  NECROSIS - SEE COMMENT 2. Lymph node, needle/core biopsy, right axilla - INVASIVE MAMMARY CARCINOMA - SEE COMMENT 3. Lymph node, needle/core biopsy, left axilla - BENIGN LYMPH NODE - NO CARCINOMA IDENTIFIED Microscopic Comment 1. The biopsy material shows an infiltrative proliferation of cells arranged linearly and in small clusters. Based on the biopsy, the carcinoma appears Nottingham grade 2 of 3 and measures 1.2 cm in greatest linear extent. E-cadherin and prognostic markers (ER/PR/ki-67/HER2)are pending and will be reported in an addendum. Dr. Tresa Moore reviewed the case and agrees with the above diagnosis. These results were called to [facility] on November 26, 2019. 2. Definitive nodal tissue is not identified. This biopsy has a slightly different architecture morphology than what is seen in part 1. The carcinoma measures 0.7 cm in greatest dimension. E-cadherin and prognostic markers (ER, PR, Ki-67 and HER-2) are pending and will be reported in an addendum.   11/25/2019 Receptors her2   1. PROGNOSTIC INDICATORS Results: IMMUNOHISTOCHEMICAL AND MORPHOMETRIC ANALYSIS PERFORMED MANUALLY The tumor cells are POSITIVE for Her2 (3+). Estrogen Receptor: 90%, POSITIVE, STRONG STAINING INTENSITY Progesterone Receptor: 20%, POSITIVE, STRONG STAINING INTENSITY Proliferation Marker Ki67: 30%  2. PROGNOSTIC INDICATORS Results: IMMUNOHISTOCHEMICAL AND MORPHOMETRIC ANALYSIS PERFORMED MANUALLY The tumor cells are POSITIVE for Her2 (3+). Estrogen Receptor: 90%, POSITIVE,  STRONG STAINING INTENSITY Progesterone Receptor: 25%, POSITIVE, STRONG STAINING INTENSITY Proliferation Marker Ki67: 30%   11/25/2019 Cancer Staging   Staging form: Breast, AJCC 8th Edition - Clinical stage from 11/25/2019: Stage IIA (cT3, cN1, cM0, G2, ER+, PR+, HER2+) - Signed by Truitt Merle, MD on 12/03/2019    12/01/2019 Initial Diagnosis   Malignant neoplasm of upper-outer quadrant of right breast in [place],  estrogen receptor positive ([place])   12/10/2019 Imaging   Bone Scan  IMPRESSION: Findings concerning for bony metastatic disease in the left acetabulum region, L3 vertebral body region, and anterolateral right tenth rib.   Increased uptake in several large joints likely is of arthropathic etiology.   12/11/2019 Imaging   CT CAP w contrast  IMPRESSION: Prominent glandular tissue in the central right breast with nipple retraction, likely corresponding to the patient's known right breast neoplasm.   Prominent right axillary nodes measuring up to 8 mm short axis, suspicious for nodal metastases in this clinical context.   3 mm subpleural pulmonary nodule in the right lower lobe, likely benign. However, follow-up CT chest is suggested in 3-6 months.   Multiple hepatic lesions, including a dominant 2.4 cm lesion in segment 6, which is considered indeterminate. Metastasis is not excluded. Consider MRI abdomen with/without contrast for further characterization.   11 mm sclerotic lesion along the left posterior aspect of the T7 vertebral body raises concern for isolated osseous metastasis. Correlate with priors if available. Benign vertebral hemangioma at T11.   12/12/2019 Breast MRI   IMPRESSION: 1. The biopsy proven malignancy in the right breast involves all 4 quadrants and spans up to 9 cm by MRI. There is enhancement within the skin at the level of the nipple.   2. Multiple matted masses are seen in the right axilla, either abnormal metastatic lymph nodes or a combination of masses and metastatic lymph nodes. One of these has been previously biopsied showing invasive mammary carcinoma without definitive nodal tissue.   3.  There is a 1.5 cm Rotter's lymph node on the right.   4. There is markedly abnormal enhancement and edema in the right pectoralis major muscle spanning 6-7 cm, suspicious for metastatic disease.   5. There is diffuse nodular enhancement throughout the  left breast, which may correspond with the solid-appearing masses seen on ultrasound. These all have a similar appearance on MRI.   6.  No findings of left axillary lymphadenopathy.   12/15/2019 Procedure   PAC placed    12/24/2019 Imaging   MRI abdomen  IMPRESSION: 1. Multifocal enhancing bone lesions are noted within the lumbar spine, and bony pelvis compatible with metastatic disease. 2. Four, small peripherally enhancing cystic lesions are noted within the liver worrisome for metastatic disease. 3. 2 enhancing liver lesions are also noted with signal and enhancement characteristics consistent with benign liver hemangioma. 4. Well-circumscribed, enhancing T2 and T1 hypointense structure within the right adnexal region is indeterminate. It is unclear whether not this is arising from the right ovary, broad ligament or right side of uterine fundus. Differential considerations include fibrothecoma, versus pedunculated uterine leiomyoma. Metastatic disease is considered less favored. Attention on follow-up imaging is recommended.     01/03/2020 - 05/27/2020 Chemotherapy   First line THP q3weeks starting 01/03/20-05/27/20   01/05/2020 Imaging   US Abdomen  IMPRESSION: 1. Suspect hematoma in the medial right lobe of the liver in the area of previous biopsy. Note that recent MR does not show lesion in this area. This area has ill-defined margins and  measures 3.9 x 4.0 x 3.5 cm. No perihepatic fluid.   2. Probable hemangiomas elsewhere in the right lobe of the liver. Note that several smaller lesions seen on recent MR are not appreciable by ultrasound.   3.  Study otherwise unremarkable.   01/12/2020 Pathology Results   FINAL MICROSCOPIC DIAGNOSIS:   A. BONE, SCLEROTIC LESION, LEFT ANTERIOR ILIAC SPINE, BIOPSY:  - Metastatic carcinoma, consistent with patient's clinical history of  primary breast carcinoma  - See comment   COMMENT:  Immunohistochemical stains show that the tumor  cells are positive for  CK7 and GATA3; and negative for CK20, consistent with above  interpretation.   PROGNOSTIC INDICATOR RESULTS:  The tumor cells are NEGATIVE for Her2 (0); see comment  Estrogen Receptor: NEGATIVE; see comment  Progesterone Receptor: NEGATIVE; see comment    03/23/2020 Imaging   CT CAP IMPRESSION: 1. Right lower lobe perifissural nodule is slightly more conspicuous on today's study. Close attention on follow-up recommended. 2. Interval progression of segment IV liver lesion, concerning for metastatic progression. The remaining visualized liver lesions are stable to smaller in the interval. 3. Interval evolution in appearance of thoracolumbar spinal lesions and left iliac crest lesion, potentially reflecting response to therapy/healing. No definite new osseous lesions on today's study. 4. New small fluid collection right cul-de-sac. 5. Probable fibroid change in the uterus including exophytic right fundal lesion. 6. Aortic Atherosclerosis (ICD10-I70.0).   03/31/2020 Imaging   MR ABD IMPRESSION: 1. Enlarging hepatic lesion in hepatic subsegment IV suspicious for hepatic metastasis. The it is uncertain whether the change in the short interval is due to technical factors with different modalities or true enlargement. 2. Lesion in the posterior RIGHT hepatic lobe with imaging features that are atypical for hemangioma but stability and signs of enhancement on later phase raising this question. Another more classic appearing may angioma seen inferior to this location in hepatic subsegment VI. Attention on follow-up. 3. Heterogeneous enhancement throughout the spine particularly at L3 as exhibited on the prior study with very patchy marrow signal remains suspicious for bony metastatic lesions in this patient with known bone metastases.   04/01/2020 Imaging   Bone scan IMPRESSION: Good response to therapy with resolution of sites of uptake seen previously at the  cervical spine and posterior RIGHT tenth rib as well as a diminished degree of uptake at remaining previously identified osseous metastatic foci.   04/28/2020 PET scan   IMPRESSION: 1. No hypermetabolic lesions to suggest active metastatic disease involving the liver or osseous structures. 2. Stable small low-attenuation liver lesions, likely treated disease.   06/18/2020 -  Chemotherapy   Given her excellent response, I switched to maintenance therapy with Herceptin, Perjeta (Phesgo) q3weeks and Letrozole.   06/18/2020 -  Chemotherapy   Zometa starting 06/18/20   06/18/2020 -  Anti-estrogen oral therapy   Letrozole 2.73m once dialy.    09/03/2020 Imaging   Bone Scan IMPRESSION: 1. Significant interval improvement compared to previous studies. Only mild uptake remains in the pelvis as above. There has been significant interval improvement over time.  CT C/A/P IMPRESSION: 1. Hypodense liver lesions are slightly decreased in size compared to prior examination, consistent with treatment response of hepatic metastatic disease. 2. Unchanged sclerotic lesion of the anterior left iliac crest. Unchanged lytic superior endplate deformity of L3, which remains equivocal for metastatic lesion versus mechanical Schmorl deformity. No CT evidence of new osseous metastatic disease. 3. No evidence of new metastatic disease in the chest, abdomen, or pelvis. 4. Coronary  artery disease.      INTERVAL HISTORY:  Nafeesa Dils is here for a follow up of metastatic breast cancer. She was last seen by me on 03/11/21. She presents to the clinic alone. She reports she continues to do well overall, with no new changes or concerns.   All other systems were reviewed with the patient and are negative.  MEDICAL HISTORY:  Past Medical History:  Diagnosis Date   Cancer ([place]) 2021   Hypertension     SURGICAL HISTORY: Past Surgical History:  Procedure Laterality Date   left parotid tumor removal    2006   PORTACATH PLACEMENT Left 12/15/2019   Procedure: INSERTION PORT-A-CATH WITH ULTRASOUND GUIDANCE;  Surgeon: [name], MD;  Location: [facility];  Service: General;  Laterality: Left;   TONSILLECTOMY      I have reviewed the social history and family history with the patient and they are unchanged from previous note.  ALLERGIES:  has No Known Allergies.  MEDICATIONS:  Current Outpatient Medications  Medication Sig Dispense Refill   acetaminophen (TYLENOL) 500 MG tablet Take 1,000 mg by mouth every 6 (six) hours as needed for moderate pain or headache.     amLODipine (NORVASC) 10 MG tablet Take 1 tablet (10 mg total) by mouth daily. (Patient taking differently: Take 20 mg by mouth daily.) 90 tablet 3   Cholecalciferol (VITAMIN D3) 50 MCG (2000 UT) TABS Take 2,000 Units by mouth daily.      letrozole (FEMARA) 2.5 MG tablet Take 1 tablet (2.5 mg total) by mouth daily. 90 tablet 1   lidocaine-prilocaine (EMLA) cream Apply 1 application topically as needed. 30 g 1   losartan (COZAAR) 100 MG tablet Take 1 tablet (100 mg total) by mouth daily. 30 tablet 6   metoprolol succinate (TOPROL-XL) 100 MG 24 hr tablet Take 1 tablet (100 mg total) by mouth daily. 30 tablet 6   omeprazole (PRILOSEC OTC) 20 MG tablet Take 20 mg by mouth daily.     potassium chloride SA (KLOR-CON M20) 20 MEQ tablet Take 1 tablet (20 mEq total) by mouth 2 (two) times daily. 90 tablet 1   No current facility-administered medications for this visit.   Facility-Administered Medications Ordered in Other Visits  Medication Dose Route Frequency Provider Last Rate Last Admin   pertuz-trastuz-hyaluron-zzxf ([place]) 600-600-20000 MG-MG-U/10ML chemo SQ injection maintenance dose 10 mL  10 mL Subcutaneous Once Truitt Merle, MD        PHYSICAL EXAMINATION: ECOG PERFORMANCE STATUS: 0 - Asymptomatic  Vitals:   04/22/21 1207  BP: 134/75  Pulse: 82  Resp: 18  Temp: 98.4 F (36.9 C)  SpO2: 99%   Wt Readings from Last 3  Encounters:  04/22/21 193 lb 12.8 oz (87.9 kg)  04/01/21 191 lb (86.6 kg)  03/11/21 192 lb 6.4 oz (87.3 kg)     GENERAL:alert, no distress and comfortable SKIN: skin color, texture, turgor are normal, no rashes or significant lesions EYES: normal, Conjunctiva are pink and non-injected, sclera clear  NECK: supple, thyroid normal size, non-tender, without nodularity LYMPH:  no palpable lymphadenopathy in the cervical, axillary LUNGS: clear to auscultation and percussion with normal breathing effort HEART: regular rate & rhythm and no murmurs and no lower extremity edema ABDOMEN:abdomen soft, non-tender and normal bowel sounds Musculoskeletal:no cyanosis of digits and no clubbing  NEURO: alert & oriented x 3 with fluent speech, no focal motor/sensory deficits BREAST: No palpable mass, nodules or adenopathy bilaterally. Breast exam benign.   LABORATORY DATA:  I have  reviewed the data as listed CBC Latest Ref Rng & Units 04/22/2021 03/11/2021 02/18/2021  WBC 4.0 - 10.5 K/uL 7.2 6.0 5.4  Hemoglobin 12.0 - 15.0 g/dL 12.1 13.1 13.1  Hematocrit 36.0 - 46.0 % 36.4 39.0 39.8  Platelets 150 - 400 K/uL 260 285 275     CMP Latest Ref Rng & Units 04/22/2021 03/11/2021 02/18/2021  Glucose 70 - 99 mg/dL 115(H) 86 120(H)  BUN 6 - 20 mg/dL '14 15 13  ' Creatinine 0.44 - 1.00 mg/dL 0.73 0.76 0.78  Sodium 135 - 145 mmol/L 140 141 142  Potassium 3.5 - 5.1 mmol/L 3.7 3.7 3.2(L)  Chloride 98 - 111 mmol/L 107 110 109  CO2 22 - 32 mmol/L '28 24 26  ' Calcium 8.9 - 10.3 mg/dL 9.2 9.0 9.4  Total Protein 6.5 - 8.1 g/dL 7.0 7.4 7.2  Total Bilirubin 0.3 - 1.2 mg/dL 0.4 0.3 0.6  Alkaline Phos 38 - 126 U/L 79 93 78  AST 15 - 41 U/L 15 19 13(L)  ALT 0 - 44 U/L '10 10 8      ' RADIOGRAPHIC STUDIES: I have personally reviewed the radiological images as listed and agreed with the findings in the report. No results found.    No orders of the defined types were placed in this encounter.  All questions were  answered. The patient knows to call the clinic with any problems, questions or concerns. No barriers to learning was detected. The total time spent in the appointment was 30 minutes.     Truitt Merle, MD 04/22/2021   I, Wilburn Mylar, am acting as scribe for Truitt Merle, MD.   I have reviewed the above documentation for accuracy and completeness, and I agree with the above.

## 2021-04-22 NOTE — Progress Notes (Signed)
Patient declined to stay for observation period after injection. Patient ambulatory and in no distress upon leaving infusion room.

## 2021-04-22 NOTE — Patient Instructions (Signed)
[facility]  Discharge Instructions: Thank you for choosing [facility] to provide your oncology and hematology care.   If you have a lab appointment with the [facility], please go directly to the [facility] and check in at the registration area.   Wear comfortable clothing and clothing appropriate for easy access to any Portacath or PICC line.   We strive to give you quality time with your provider. You may need to reschedule your appointment if you arrive late (15 or more minutes).  Arriving late affects you and other patients whose appointments are after yours.  Also, if you miss three or more appointments without notifying the office, you may be dismissed from the clinic at the providers discretion.      For prescription refill requests, have your pharmacy contact our office and allow 72 hours for refills to be completed.    Today you received the following chemotherapy and/or immunotherapy agents Phesgo      To help prevent nausea and vomiting after your treatment, we encourage you to take your nausea medication as directed.  BELOW ARE SYMPTOMS THAT SHOULD BE REPORTED IMMEDIATELY: *FEVER GREATER THAN 100.4 F (38 C) OR HIGHER *CHILLS OR SWEATING *NAUSEA AND VOMITING THAT IS NOT CONTROLLED WITH YOUR NAUSEA MEDICATION *UNUSUAL SHORTNESS OF BREATH *UNUSUAL BRUISING OR BLEEDING *URINARY PROBLEMS (pain or burning when urinating, or frequent urination) *BOWEL PROBLEMS (unusual diarrhea, constipation, pain near the anus) TENDERNESS IN MOUTH AND THROAT WITH OR WITHOUT PRESENCE OF ULCERS (sore throat, sores in mouth, or a toothache) UNUSUAL RASH, SWELLING OR PAIN  UNUSUAL VAGINAL DISCHARGE OR ITCHING   Items with * indicate a potential emergency and should be followed up as soon as possible or go to the Emergency Department if any problems should occur.  Please show the CHEMOTHERAPY ALERT CARD or IMMUNOTHERAPY ALERT CARD at check-in to the  Emergency Department and triage nurse.  Should you have questions after your visit or need to cancel or reschedule your appointment, please contact [facility]  Dept: [phone]  and follow the prompts.  Office hours are 8:00 a.m. to 4:30 p.m. Monday - Friday. Please note that voicemails left after 4:00 p.m. may not be returned until the following business day.  We are closed weekends and major holidays. You have access to a nurse at all times for urgent questions. Please call the main number to the clinic Dept: [phone] and follow the prompts.  Pertuzumab; Trastuzumab; Hyaluronidase Injection What is this medication? PERTUZUMAB (per TOOZ ue mab); TRASTUZUMAB (tras TOO zoo mab); HYALURONIDASE (hye al ur ON i dase) is used to treat breast cancer. Pertuzumab and trastuzumab are a monoclonal antibodies. Hyaluronidase is used to improve the effects of pertuzumab and trastuzumab. This medicine may be used for other purposes; ask your health care provider or pharmacist if you have questions. COMMON BRAND NAME(S): PHESGO What should I tell my care team before I take this medication? They need to know if you have any of these conditions: heart disease high blood pressure history of irregular heartbeat lung or breathing disease, like asthma an unusual or allergic reaction to pertuzumab, trastuzumab, hyaluronidase, other medicines, foods, dyes, or preservatives pregnant or trying to get pregnant breast-feeding How should I use this medication? This medicine is for injection under the skin. It may be given by a health care professional in a hospital or clinic setting or a health care professional may give you this medicine at home.  Talk to your pediatrician regarding the use of this medicine in children. Special care may be needed. Overdosage: If you think you have taken too much of this medicine contact a poison control center or emergency room at once. NOTE: This  medicine is only for you. Do not share this medicine with others. What if I miss a dose? It is important not to miss your dose. Call your doctor or health care professional if you are unable to keep an appointment. What may interact with this medication? This medicine may interact with the following medications: certain types of chemotherapy, such as daunorubicin, doxorubicin, epirubicin, and idarubicin This list may not describe all possible interactions. Give your health care provider a list of all the medicines, herbs, non-prescription drugs, or dietary supplements you use. Also tell them if you smoke, drink alcohol, or use illegal drugs. Some items may interact with your medicine. What should I watch for while using this medication? Visit your health care professional for regular checks on your progress. Tell your health care professional if your symptoms do not start to get better or if they get worse. Your condition will be monitored carefully while you are receiving this medicine. Do not become pregnant while taking this medicine or for 7 months after stopping it. Women should inform their doctor if they wish to become pregnant or think they might be pregnant. There is a potential for serious side effects to an unborn child. Talk to your health care professional or pharmacist for more information. What side effects may I notice from receiving this medication? Side effects that you should report to your doctor or health care professional as soon as possible: allergic reactions like skin rash, itching or hives, swelling of the face, lips, or tongue breathing problems chest pain cough dizziness feeling faint; lightheaded, falls fever or chills loss of consciousness pain, tingling, numbness in the hands or feet palpitations sudden weight gain swelling of the ankles or legs unusually weak or tired Side effects that usually do not require medical attention (report these to your doctor or  health care professional if they continue or are bothersome): diarrhea hair loss joint pain muscle pain nausea, vomiting pain, redness, or irritation at site where injected tiredness This list may not describe all possible side effects. Call your doctor for medical advice about side effects. You may report side effects to FDA at 1-800-FDA-1088. Where should I keep my medication? Keep out of the reach of children. If you are using this medicine at home, you will be instructed on how to store it. Throw away any unused medicine after the expiration date on the label. NOTE: This sheet is a summary. It may not cover all possible information. If you have questions about this medicine, talk to your doctor, pharmacist, or health care provider.  2022 Elsevier/Gold Standard (2020-12-28 00:00:00)   For any non-urgent questions, you may also contact your provider using MyChart. We now offer e-Visits for anyone 92 and older to request care online for non-urgent symptoms. For details visit [email or web address].   Also download the MyChart app! Go to the app store, search "MyChart", open the app, select , and log in with your MyChart username and password.  Due to Covid, a mask is required upon entering the hospital/clinic. If you do not have a mask, one will be given to you upon arrival. For doctor visits, patients may have 1 support person aged 45 or older with them. For treatment visits, patients cannot have anyone with  them due to current Covid guidelines and our immunocompromised population.

## 2021-04-22 NOTE — Telephone Encounter (Signed)
Sent scheduling message to DB for her infusion in 3 and 6 weeks.

## 2021-05-13 ENCOUNTER — Other Ambulatory Visit: Payer: Self-pay

## 2021-05-13 ENCOUNTER — Inpatient Hospital Stay: Payer: Self-pay | Attending: Hematology

## 2021-05-13 VITALS — BP 120/78 | HR 87 | Temp 98.1°F | Resp 18 | Ht 67.0 in | Wt 194.2 lb

## 2021-05-13 DIAGNOSIS — C50411 Malignant neoplasm of upper-outer quadrant of right female breast: Secondary | ICD-10-CM | POA: Insufficient documentation

## 2021-05-13 DIAGNOSIS — Z5111 Encounter for antineoplastic chemotherapy: Secondary | ICD-10-CM | POA: Insufficient documentation

## 2021-05-13 DIAGNOSIS — Z170 Estrogen receptor positive status [ER+]: Secondary | ICD-10-CM | POA: Insufficient documentation

## 2021-05-13 DIAGNOSIS — C7951 Secondary malignant neoplasm of bone: Secondary | ICD-10-CM | POA: Insufficient documentation

## 2021-05-13 MED ORDER — PERTUZ-TRASTUZ-HYALURON-ZZXF 60-60-2000 MG-MG-U/ML CHEMO ~~LOC~~ SOLN
10.0000 mL | Freq: Once | SUBCUTANEOUS | Status: AC
  Administered 2021-05-13: 10 mL via SUBCUTANEOUS
  Filled 2021-05-13: qty 10

## 2021-05-13 MED ORDER — DIPHENHYDRAMINE HCL 25 MG PO CAPS
50.0000 mg | ORAL_CAPSULE | Freq: Once | ORAL | Status: AC
  Administered 2021-05-13: 50 mg via ORAL
  Filled 2021-05-13: qty 2

## 2021-05-13 MED ORDER — ACETAMINOPHEN 325 MG PO TABS
650.0000 mg | ORAL_TABLET | Freq: Once | ORAL | Status: AC
  Administered 2021-05-13: 650 mg via ORAL
  Filled 2021-05-13: qty 2

## 2021-05-13 NOTE — Progress Notes (Signed)
Patient presents for treatment. RN assessment completed along with the following:  Labs/vitals reviewed - Yes, and within treatment parameters.   Weight within 10% of previous measurement - Yes Informed consent completed and reflects current therapy/intent - Yes, on date 06/18/20             Provider progress note reviewed - Patient not seen by provider today. Most recent note dated 04/22/21 reviewed. Treatment/Antibody/Supportive plan reviewed - Yes, and there are no adjustments needed for today's treatment. S&H and other orders reviewed - Yes, and there are no additional orders identified. Previous treatment date reviewed - Yes, and the appropriate amount of time has elapsed between treatments.   Patient to proceed with treatment.    Patient monitored for 15 minutes post injection without any issues. VSS upon leaving infusion room.

## 2021-05-13 NOTE — Patient Instructions (Signed)
[facility]  Discharge Instructions: Thank you for choosing [facility] to provide your oncology and hematology care.   If you have a lab appointment with the [facility], please go directly to the [facility] and check in at the registration area.   Wear comfortable clothing and clothing appropriate for easy access to any Portacath or PICC line.   We strive to give you quality time with your provider. You may need to reschedule your appointment if you arrive late (15 or more minutes).  Arriving late affects you and other patients whose appointments are after yours.  Also, if you miss three or more appointments without notifying the office, you may be dismissed from the clinic at the providers discretion.      For prescription refill requests, have your pharmacy contact our office and allow 72 hours for refills to be completed.    Today you received the following chemotherapy and/or immunotherapy agents Phesgo      To help prevent nausea and vomiting after your treatment, we encourage you to take your nausea medication as directed.  BELOW ARE SYMPTOMS THAT SHOULD BE REPORTED IMMEDIATELY: *FEVER GREATER THAN 100.4 F (38 C) OR HIGHER *CHILLS OR SWEATING *NAUSEA AND VOMITING THAT IS NOT CONTROLLED WITH YOUR NAUSEA MEDICATION *UNUSUAL SHORTNESS OF BREATH *UNUSUAL BRUISING OR BLEEDING *URINARY PROBLEMS (pain or burning when urinating, or frequent urination) *BOWEL PROBLEMS (unusual diarrhea, constipation, pain near the anus) TENDERNESS IN MOUTH AND THROAT WITH OR WITHOUT PRESENCE OF ULCERS (sore throat, sores in mouth, or a toothache) UNUSUAL RASH, SWELLING OR PAIN  UNUSUAL VAGINAL DISCHARGE OR ITCHING   Items with * indicate a potential emergency and should be followed up as soon as possible or go to the Emergency Department if any problems should occur.  Please show the CHEMOTHERAPY ALERT CARD or IMMUNOTHERAPY ALERT CARD at check-in to the  Emergency Department and triage nurse.  Should you have questions after your visit or need to cancel or reschedule your appointment, please contact [facility]  Dept: [phone]  and follow the prompts.  Office hours are 8:00 a.m. to 4:30 p.m. Monday - Friday. Please note that voicemails left after 4:00 p.m. may not be returned until the following business day.  We are closed weekends and major holidays. You have access to a nurse at all times for urgent questions. Please call the main number to the clinic Dept: [phone] and follow the prompts.  Pertuzumab; Trastuzumab; Hyaluronidase Injection What is this medication? PERTUZUMAB (per TOOZ ue mab); TRASTUZUMAB (tras TOO zoo mab); HYALURONIDASE (hye al ur ON i dase) is used to treat breast cancer. Pertuzumab and trastuzumab are a monoclonal antibodies. Hyaluronidase is used to improve the effects of pertuzumab and trastuzumab. This medicine may be used for other purposes; ask your health care provider or pharmacist if you have questions. COMMON BRAND NAME(S): PHESGO What should I tell my care team before I take this medication? They need to know if you have any of these conditions: heart disease high blood pressure history of irregular heartbeat lung or breathing disease, like asthma an unusual or allergic reaction to pertuzumab, trastuzumab, hyaluronidase, other medicines, foods, dyes, or preservatives pregnant or trying to get pregnant breast-feeding How should I use this medication? This medicine is for injection under the skin. It may be given by a health care professional in a hospital or clinic setting or a health care professional may give you this medicine at home.  Talk to your pediatrician regarding the use of this medicine in children. Special care may be needed. Overdosage: If you think you have taken too much of this medicine contact a poison control center or emergency room at once. NOTE: This  medicine is only for you. Do not share this medicine with others. What if I miss a dose? It is important not to miss your dose. Call your doctor or health care professional if you are unable to keep an appointment. What may interact with this medication? This medicine may interact with the following medications: certain types of chemotherapy, such as daunorubicin, doxorubicin, epirubicin, and idarubicin This list may not describe all possible interactions. Give your health care provider a list of all the medicines, herbs, non-prescription drugs, or dietary supplements you use. Also tell them if you smoke, drink alcohol, or use illegal drugs. Some items may interact with your medicine. What should I watch for while using this medication? Visit your health care professional for regular checks on your progress. Tell your health care professional if your symptoms do not start to get better or if they get worse. Your condition will be monitored carefully while you are receiving this medicine. Do not become pregnant while taking this medicine or for 7 months after stopping it. Women should inform their doctor if they wish to become pregnant or think they might be pregnant. There is a potential for serious side effects to an unborn child. Talk to your health care professional or pharmacist for more information. What side effects may I notice from receiving this medication? Side effects that you should report to your doctor or health care professional as soon as possible: allergic reactions like skin rash, itching or hives, swelling of the face, lips, or tongue breathing problems chest pain cough dizziness feeling faint; lightheaded, falls fever or chills loss of consciousness pain, tingling, numbness in the hands or feet palpitations sudden weight gain swelling of the ankles or legs unusually weak or tired Side effects that usually do not require medical attention (report these to your doctor or  health care professional if they continue or are bothersome): diarrhea hair loss joint pain muscle pain nausea, vomiting pain, redness, or irritation at site where injected tiredness This list may not describe all possible side effects. Call your doctor for medical advice about side effects. You may report side effects to FDA at 1-800-FDA-1088. Where should I keep my medication? Keep out of the reach of children. If you are using this medicine at home, you will be instructed on how to store it. Throw away any unused medicine after the expiration date on the label. NOTE: This sheet is a summary. It may not cover all possible information. If you have questions about this medicine, talk to your doctor, pharmacist, or health care provider.  2022 Elsevier/Gold Standard (2020-12-28 00:00:00)   For any non-urgent questions, you may also contact your provider using MyChart. We now offer e-Visits for anyone 10 and older to request care online for non-urgent symptoms. For details visit [email or web address].   Also download the MyChart app! Go to the app store, search "MyChart", open the app, select [facility], and log in with your MyChart username and password.  Due to Covid, a mask is required upon entering the hospital/clinic. If you do not have a mask, one will be given to you upon arrival. For doctor visits, patients may have 1 support person aged 28 or older with them. For treatment visits, patients cannot have anyone with  them due to current Covid guidelines and our immunocompromised population.

## 2021-06-03 ENCOUNTER — Inpatient Hospital Stay: Payer: Self-pay | Attending: Hematology

## 2021-06-03 ENCOUNTER — Other Ambulatory Visit: Payer: Self-pay

## 2021-06-03 VITALS — BP 112/73 | HR 89 | Temp 97.5°F | Resp 18 | Ht 67.0 in | Wt 193.4 lb

## 2021-06-03 DIAGNOSIS — Z5111 Encounter for antineoplastic chemotherapy: Secondary | ICD-10-CM | POA: Insufficient documentation

## 2021-06-03 DIAGNOSIS — C7951 Secondary malignant neoplasm of bone: Secondary | ICD-10-CM | POA: Insufficient documentation

## 2021-06-03 DIAGNOSIS — C50411 Malignant neoplasm of upper-outer quadrant of right female breast: Secondary | ICD-10-CM | POA: Insufficient documentation

## 2021-06-03 DIAGNOSIS — Z170 Estrogen receptor positive status [ER+]: Secondary | ICD-10-CM

## 2021-06-03 MED ORDER — PERTUZ-TRASTUZ-HYALURON-ZZXF 60-60-2000 MG-MG-U/ML CHEMO ~~LOC~~ SOLN
10.0000 mL | Freq: Once | SUBCUTANEOUS | Status: AC
  Administered 2021-06-03: 10 mL via SUBCUTANEOUS
  Filled 2021-06-03: qty 10

## 2021-06-03 MED ORDER — DIPHENHYDRAMINE HCL 25 MG PO CAPS
50.0000 mg | ORAL_CAPSULE | Freq: Once | ORAL | Status: AC
  Administered 2021-06-03: 50 mg via ORAL
  Filled 2021-06-03: qty 2

## 2021-06-03 MED ORDER — ACETAMINOPHEN 325 MG PO TABS
650.0000 mg | ORAL_TABLET | Freq: Once | ORAL | Status: AC
  Administered 2021-06-03: 650 mg via ORAL
  Filled 2021-06-03: qty 2

## 2021-06-03 NOTE — Progress Notes (Signed)
Patient presents for treatment. RN assessment completed along with the following:  Labs/vitals reviewed - Yes, and within treatment parameters.   Weight within 10% of previous measurement - Yes Informed consent completed and reflects current therapy/intent - Yes, on date 06/18/20             Provider progress note reviewed - Patient not seen by provider today. Most recent note dated 04/22/21 reviewed. Treatment/Antibody/Supportive plan reviewed - Yes, and there are no adjustments needed for today's treatment. S&H and other orders reviewed - Yes, and there are no additional orders identified. Previous treatment date reviewed - Yes, and the appropriate amount of time has elapsed between treatments.  Patient to proceed with treatment.    Patient monitored for 15 minutes post injection.  VSS upon leaving infusion room.

## 2021-06-03 NOTE — Patient Instructions (Signed)
[facility]  Discharge Instructions: Thank you for choosing [facility] to provide your oncology and hematology care.   If you have a lab appointment with the [facility], please go directly to the [facility] and check in at the registration area.   Wear comfortable clothing and clothing appropriate for easy access to any Portacath or PICC line.   We strive to give you quality time with your provider. You may need to reschedule your appointment if you arrive late (15 or more minutes).  Arriving late affects you and other patients whose appointments are after yours.  Also, if you miss three or more appointments without notifying the office, you may be dismissed from the clinic at the providers discretion.      For prescription refill requests, have your pharmacy contact our office and allow 72 hours for refills to be completed.    Today you received the following chemotherapy and/or immunotherapy agents Phesgo      To help prevent nausea and vomiting after your treatment, we encourage you to take your nausea medication as directed.  BELOW ARE SYMPTOMS THAT SHOULD BE REPORTED IMMEDIATELY: *FEVER GREATER THAN 100.4 F (38 C) OR HIGHER *CHILLS OR SWEATING *NAUSEA AND VOMITING THAT IS NOT CONTROLLED WITH YOUR NAUSEA MEDICATION *UNUSUAL SHORTNESS OF BREATH *UNUSUAL BRUISING OR BLEEDING *URINARY PROBLEMS (pain or burning when urinating, or frequent urination) *BOWEL PROBLEMS (unusual diarrhea, constipation, pain near the anus) TENDERNESS IN MOUTH AND THROAT WITH OR WITHOUT PRESENCE OF ULCERS (sore throat, sores in mouth, or a toothache) UNUSUAL RASH, SWELLING OR PAIN  UNUSUAL VAGINAL DISCHARGE OR ITCHING   Items with * indicate a potential emergency and should be followed up as soon as possible or go to the Emergency Department if any problems should occur.  Please show the CHEMOTHERAPY ALERT CARD or IMMUNOTHERAPY ALERT CARD at check-in to the  Emergency Department and triage nurse.  Should you have questions after your visit or need to cancel or reschedule your appointment, please contact [facility]  Dept: [phone]  and follow the prompts.  Office hours are 8:00 a.m. to 4:30 p.m. Monday - Friday. Please note that voicemails left after 4:00 p.m. may not be returned until the following business day.  We are closed weekends and major holidays. You have access to a nurse at all times for urgent questions. Please call the main number to the clinic Dept: [phone] and follow the prompts.  Pertuzumab; Trastuzumab; Hyaluronidase Injection What is this medication? PERTUZUMAB (per TOOZ ue mab); TRASTUZUMAB (tras TOO zoo mab); HYALURONIDASE (hye al ur ON i dase) is used to treat breast cancer. Pertuzumab and trastuzumab are a monoclonal antibodies. Hyaluronidase is used to improve the effects of pertuzumab and trastuzumab. This medicine may be used for other purposes; ask your health care provider or pharmacist if you have questions. COMMON BRAND NAME(S): PHESGO What should I tell my care team before I take this medication? They need to know if you have any of these conditions: heart disease high blood pressure history of irregular heartbeat lung or breathing disease, like asthma an unusual or allergic reaction to pertuzumab, trastuzumab, hyaluronidase, other medicines, foods, dyes, or preservatives pregnant or trying to get pregnant breast-feeding How should I use this medication? This medicine is for injection under the skin. It may be given by a health care professional in a hospital or clinic setting or a health care professional may give you this medicine at home.  Talk to your pediatrician regarding the use of this medicine in children. Special care may be needed. Overdosage: If you think you have taken too much of this medicine contact a poison control center or emergency room at once. NOTE: This  medicine is only for you. Do not share this medicine with others. What if I miss a dose? It is important not to miss your dose. Call your doctor or health care professional if you are unable to keep an appointment. What may interact with this medication? This medicine may interact with the following medications: certain types of chemotherapy, such as daunorubicin, doxorubicin, epirubicin, and idarubicin This list may not describe all possible interactions. Give your health care provider a list of all the medicines, herbs, non-prescription drugs, or dietary supplements you use. Also tell them if you smoke, drink alcohol, or use illegal drugs. Some items may interact with your medicine. What should I watch for while using this medication? Visit your health care professional for regular checks on your progress. Tell your health care professional if your symptoms do not start to get better or if they get worse. Your condition will be monitored carefully while you are receiving this medicine. Do not become pregnant while taking this medicine or for 7 months after stopping it. Women should inform their doctor if they wish to become pregnant or think they might be pregnant. There is a potential for serious side effects to an unborn child. Talk to your health care professional or pharmacist for more information. What side effects may I notice from receiving this medication? Side effects that you should report to your doctor or health care professional as soon as possible: allergic reactions like skin rash, itching or hives, swelling of the face, lips, or tongue breathing problems chest pain cough dizziness feeling faint; lightheaded, falls fever or chills loss of consciousness pain, tingling, numbness in the hands or feet palpitations sudden weight gain swelling of the ankles or legs unusually weak or tired Side effects that usually do not require medical attention (report these to your doctor or  health care professional if they continue or are bothersome): diarrhea hair loss joint pain muscle pain nausea, vomiting pain, redness, or irritation at site where injected tiredness This list may not describe all possible side effects. Call your doctor for medical advice about side effects. You may report side effects to FDA at 1-800-FDA-1088. Where should I keep my medication? Keep out of the reach of children. If you are using this medicine at home, you will be instructed on how to store it. Throw away any unused medicine after the expiration date on the label. NOTE: This sheet is a summary. It may not cover all possible information. If you have questions about this medicine, talk to your doctor, pharmacist, or health care provider.  2022 Elsevier/Gold Standard (2020-12-28 00:00:00)   For any non-urgent questions, you may also contact your provider using MyChart. We now offer e-Visits for anyone 13 and older to request care online for non-urgent symptoms. For details visit [email or web address].   Also download the MyChart app! Go to the app store, search "MyChart", open the app, select [facility], and log in with your MyChart username and password.  Due to Covid, a mask is required upon entering the hospital/clinic. If you do not have a mask, one will be given to you upon arrival. For doctor visits, patients may have 1 support person aged 69 or older with them. For treatment visits, patients cannot have anyone with  them due to current Covid guidelines and our immunocompromised population.

## 2021-06-16 ENCOUNTER — Encounter (HOSPITAL_COMMUNITY): Payer: Self-pay | Admitting: Internal Medicine

## 2021-06-16 ENCOUNTER — Ambulatory Visit (HOSPITAL_COMMUNITY)
Admission: RE | Admit: 2021-06-16 | Discharge: 2021-06-16 | Disposition: A | Discharge status: Home / Self Care | Payer: Self-pay | Source: Ambulatory Visit | Attending: Internal Medicine | Admitting: Internal Medicine

## 2021-06-16 ENCOUNTER — Other Ambulatory Visit: Payer: Self-pay

## 2021-06-16 ENCOUNTER — Ambulatory Visit (HOSPITAL_BASED_OUTPATIENT_CLINIC_OR_DEPARTMENT_OTHER)
Admission: RE | Admit: 2021-06-16 | Discharge: 2021-06-16 | Disposition: A | Discharge status: Home / Self Care | Payer: Self-pay | Source: Ambulatory Visit | Attending: Internal Medicine | Admitting: Internal Medicine

## 2021-06-16 VITALS — BP 166/90 | HR 76 | Ht 67.0 in | Wt 196.2 lb

## 2021-06-16 DIAGNOSIS — Z09 Encounter for follow-up examination after completed treatment for conditions other than malignant neoplasm: Secondary | ICD-10-CM | POA: Insufficient documentation

## 2021-06-16 DIAGNOSIS — Z7901 Long term (current) use of anticoagulants: Secondary | ICD-10-CM | POA: Insufficient documentation

## 2021-06-16 DIAGNOSIS — C7951 Secondary malignant neoplasm of bone: Secondary | ICD-10-CM | POA: Insufficient documentation

## 2021-06-16 DIAGNOSIS — I10 Essential (primary) hypertension: Secondary | ICD-10-CM | POA: Insufficient documentation

## 2021-06-16 DIAGNOSIS — I427 Cardiomyopathy due to drug and external agent: Secondary | ICD-10-CM

## 2021-06-16 DIAGNOSIS — T451X5A Adverse effect of antineoplastic and immunosuppressive drugs, initial encounter: Secondary | ICD-10-CM

## 2021-06-16 DIAGNOSIS — C50919 Malignant neoplasm of unspecified site of unspecified female breast: Secondary | ICD-10-CM

## 2021-06-16 DIAGNOSIS — C50911 Malignant neoplasm of unspecified site of right female breast: Secondary | ICD-10-CM | POA: Insufficient documentation

## 2021-06-16 DIAGNOSIS — Z0189 Encounter for other specified special examinations: Secondary | ICD-10-CM

## 2021-06-16 DIAGNOSIS — Z79899 Other long term (current) drug therapy: Secondary | ICD-10-CM | POA: Insufficient documentation

## 2021-06-16 DIAGNOSIS — Z8616 Personal history of COVID-19: Secondary | ICD-10-CM | POA: Insufficient documentation

## 2021-06-16 LAB — ECHOCARDIOGRAM COMPLETE
Area-P 1/2: 3.95 cm2
Calc EF: 53 %
S' Lateral: 3.2 cm
Single Plane A2C EF: 50.1 %
Single Plane A4C EF: 52.1 %

## 2021-06-16 MED ORDER — AMLODIPINE BESYLATE 10 MG PO TABS: 20.0000 mg | ORAL_TABLET | Freq: Every day | ORAL | 11 refills | Status: DC

## 2021-06-16 NOTE — Progress Notes (Addendum)
CARDIO-ONCOLOGY NOTE  Referring Physician: Dr. Burr Medico Primary Care: None HF Cardiologist: Dr. Haroldine Laws  HPI:  Ms. Wilkowski is 61 y.o. female with HTN and stage IV right breast cancer referred by Dr. Burr Medico for enrollment into the Cardio-Oncology program due to reduced EF on echo.   Diagnosed with R breast cancer in 8/21 (triple positive) found to have mets to bone. She received THP 01/03/20-05/27/20. Her restaging PET scan from 04/28/20 showed no hypermetabolic lesions. Given her excellent response, she was switched to maintenance therapy with Herceptin, Perjeta q3weeks and Letrozole starting 06/18/20 which is still ongoing.  Zometa started 06/18/20  In June had echo which showed EF 50-55% so H/P held. Losartan started.   Had covid in July. Received Paxlovid and recovered without need for hospital admission.  Echo 07/22 with EF 55-60%. Herceptin restarted.  Echo 9/22 EF 55% felt to be slightly down from before but still in normal range  Echo 03/09/21 EF 55%  Echo today 06/16/21; EF 55-60% GLS -20%  She is here for f/u. Continue on Herceptin  Doing great. Very active. No CP or SOB. No edema. BP at home 130/70s consistently   - Echo today 06/16/21; EF 55-60% GLS -20%  ECHO 07/22: EF 55-60% GLS 16 ECHO  09/22/20: EF 40-45%  (I felt 50-55%) ECHO 12/21: EF 60-65% GLS -18.2 ECHO 8/21: EF 60-65% G1DD   Review of Systems: Negative except as mentioned in HPI  Past Medical History:  Diagnosis Date   Cancer ([place]) 2021   Hypertension     Current Outpatient Medications  Medication Sig Dispense Refill   acetaminophen (TYLENOL) 500 MG tablet Take 1,000 mg by mouth every 6 (six) hours as needed for moderate pain or headache.     amLODipine (NORVASC) 10 MG tablet Take 1 tablet (10 mg total) by mouth daily. (Patient taking differently: Take 20 mg by mouth daily.) 90 tablet 3   Cholecalciferol (VITAMIN D3) 50 MCG (2000 UT) TABS Take 2,000 Units by mouth daily.      letrozole (FEMARA) 2.5 MG tablet Take  1 tablet (2.5 mg total) by mouth daily. 90 tablet 1   lidocaine-prilocaine (EMLA) cream Apply 1 application topically as needed. 30 g 1   losartan (COZAAR) 100 MG tablet Take 1 tablet (100 mg total) by mouth daily. 30 tablet 6   metoprolol succinate (TOPROL-XL) 100 MG 24 hr tablet Take 1 tablet (100 mg total) by mouth daily. 30 tablet 6   omeprazole (PRILOSEC OTC) 20 MG tablet Take 20 mg by mouth daily.     potassium chloride SA (KLOR-CON M20) 20 MEQ tablet Take 1 tablet (20 mEq total) by mouth 2 (two) times daily. 90 tablet 1   No current facility-administered medications for this encounter.    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   Tobacco Use   Smoking status: Never   Smokeless tobacco: Never  Vaping Use   Vaping Use: Never used  Substance and Sexual Activity   Alcohol use: Yes    Comment: social drinker    Drug use: No   Sexual activity: Yes  Other Topics Concern   Not on file  Social History Narrative   Not on file   Social  Determinants of Health   Financial Resource Strain: Not on file  Food Insecurity: Not on file  Transportation Needs: Not on file  Physical Activity: Not on file  Stress: Not on file  Social Connections: Not on file  Intimate Partner Violence: Not on file      Family History  Problem Relation Age of Onset   Hypertension Mother    Hypertension Father     Vitals:   06/16/21 1202  BP: (!) 166/90  Pulse: 76  SpO2: 98%  Weight: 89 kg (196 lb 3.2 oz)  Height: 5\' 7"  (1.702 m)     General:  Well appearing. No resp difficulty HEENT: normal Neck: supple. no JVD. Carotids 2+ bilat; no bruits. No lymphadenopathy or thryomegaly appreciated. Cor: PMI nondisplaced. Regular rate & rhythm. No rubs, gallops or  murmurs. Lungs: clear Abdomen: soft, nontender, nondistended. No hepatosplenomegaly. No bruits or masses. Good bowel sounds. Extremities: no cyanosis, clubbing, rash, edema Neuro: alert & orientedx3, cranial nerves grossly intact. moves all 4 extremities w/o difficulty. Affect pleasant   ASSESSMENT & PLAN: 1.  Chemotherapy-induced CM - ECHO 8/21: EF 60-65% G1DD - ECHO 12/21: EF 60-65% GLS -18.2 - ECHO  09/22/20: EF 40-45%  (I felt 50-55%) - Herceptin held in June 2022 d/t mild cardiomyopathy. Restarted in August 2022 - ECHO 07/22: EF 55-60% GLS 16 - ECHO 9/22 F 55%. LV function appeared less brisk than on prior echo in July. I discussed with Dr. Burr Medico.  - Echo 03/09/21 EF 55% (completely stable) - Echo today 06/16/21; EF 55-60% GLS -20% - Continue losartan 100 mg daily and Toprol 100 daily - Continue Herceptin. Repeat echo in 3-4 months  2. Stage IV Right Breast Cancer (triple positive) - Diagnosed with R breast cancer in 8/21 (triple positive) found to have mets to bone. She received THP 01/03/20-05/27/20. Her restaging PET scan from 04/28/20 showed no hypermetabolic lesions. Given her excellent response, she was switched to maintenance therapy with Herceptin, Perjeta q3weeks and Letrozole starting 06/18/20.  Zometa started 06/18/20, will continue every 3 months. - Herceptin held 09/22/20 due to reduced EF, restarted in August as above.  - Tolerating Herceptin well. EF stable  3. HTN - BP elevated in clinic - Home readings are well controlled - Continue current meds  Glori Bickers, MD  12:06 PM

## 2021-06-16 NOTE — Patient Instructions (Addendum)
Thank you for your visit today.  I have sent in a refill of your Amlodipine to your pharmacy.  Your physician has requested that you have an echocardiogram. Echocardiography is a painless test that uses sound waves to create images of your heart. It provides your doctor with information about the size and shape of your heart and how well your hearts chambers and valves are working. This procedure takes approximately one hour. There are no restrictions for this procedure.  Your physician recommends that you schedule a follow-up appointment in: 4 months.  If you have any questions or concerns before your next appointment please send [place] a message through [place] or call our office at [phone].    TO LEAVE A MESSAGE FOR THE NURSE SELECT OPTION 2, PLEASE LEAVE A MESSAGE INCLUDING: YOUR NAME DATE OF BIRTH CALL BACK NUMBER REASON FOR CALL**this is important as we prioritize the call backs  YOU WILL RECEIVE A CALL BACK THE SAME DAY AS LONG AS YOU CALL BEFORE 4:00 PM  At the [facility], you and your health needs are our priority. As part of our continuing mission to provide you with exceptional heart care, we have created designated Provider Care Teams. These Care Teams include your primary Cardiologist (physician) and Advanced Practice Providers (APPs- Physician Assistants and Nurse Practitioners) who all work together to provide you with the care you need, when you need it.   You may see any of the following providers on your designated Care Team at your next follow up: Dr Glori Bickers Dr Haynes Kerns, NP Lyda Jester, [place] [facility] [place], [place] Audry Riles, PharmD   Please be sure to bring in all your medications bottles to every appointment.

## 2021-06-16 NOTE — Progress Notes (Signed)
°  Echocardiogram 2D Echocardiogram has been performed.  Carolyn Stewart 06/16/2021, 11:43 AM

## 2021-06-24 ENCOUNTER — Other Ambulatory Visit: Payer: Self-pay

## 2021-06-24 ENCOUNTER — Inpatient Hospital Stay: Payer: Self-pay | Attending: Hematology

## 2021-06-24 ENCOUNTER — Inpatient Hospital Stay: Payer: Self-pay

## 2021-06-24 ENCOUNTER — Inpatient Hospital Stay (HOSPITAL_BASED_OUTPATIENT_CLINIC_OR_DEPARTMENT_OTHER): Payer: Self-pay | Admitting: Hematology

## 2021-06-24 VITALS — BP 128/72 | HR 88 | Temp 98.4°F | Resp 16 | Ht 67.0 in | Wt 195.7 lb

## 2021-06-24 DIAGNOSIS — Z170 Estrogen receptor positive status [ER+]: Secondary | ICD-10-CM

## 2021-06-24 DIAGNOSIS — C50411 Malignant neoplasm of upper-outer quadrant of right female breast: Secondary | ICD-10-CM

## 2021-06-24 DIAGNOSIS — C7951 Secondary malignant neoplasm of bone: Secondary | ICD-10-CM | POA: Insufficient documentation

## 2021-06-24 DIAGNOSIS — Z5111 Encounter for antineoplastic chemotherapy: Secondary | ICD-10-CM | POA: Insufficient documentation

## 2021-06-24 LAB — CMP (CANCER CENTER ONLY)
ALT: 7 U/L (ref 0–44)
AST: 13 U/L — ABNORMAL LOW (ref 15–41)
Albumin: 4.4 g/dL (ref 3.5–5.0)
Alkaline Phosphatase: 102 U/L (ref 38–126)
Anion gap: 5 (ref 5–15)
BUN: 10 mg/dL (ref 8–23)
CO2: 28 mmol/L (ref 22–32)
Calcium: 9.5 mg/dL (ref 8.9–10.3)
Chloride: 109 mmol/L (ref 98–111)
Creatinine: 0.88 mg/dL (ref 0.44–1.00)
GFR, Estimated: >60 mL/min (ref >60)
Glucose, Bld: 117 mg/dL — ABNORMAL HIGH (ref 70–99)
Potassium: 3.4 mmol/L — ABNORMAL LOW (ref 3.5–5.1)
Sodium: 142 mmol/L (ref 135–145)
Total Bilirubin: 0.5 mg/dL (ref 0.3–1.2)
Total Protein: 7.5 g/dL (ref 6.5–8.1)

## 2021-06-24 LAB — CBC WITH DIFFERENTIAL (CANCER CENTER ONLY)
Abs Immature Granulocytes: 0.01 10*3/uL (ref 0.00–0.07)
Basophils Absolute: 0 10*3/uL (ref 0.0–0.1)
Basophils Relative: 0 %
Eosinophils Absolute: 0.2 10*3/uL (ref 0.0–0.5)
Eosinophils Relative: 3 %
HCT: 39.4 % (ref 36.0–46.0)
Hemoglobin: 12.9 g/dL (ref 12.0–15.0)
Immature Granulocytes: 0 %
Lymphocytes Relative: 46 %
Lymphs Abs: 2.8 10*3/uL (ref 0.7–4.0)
MCH: 29.5 pg (ref 26.0–34.0)
MCHC: 32.7 g/dL (ref 30.0–36.0)
MCV: 90.2 fL (ref 80.0–100.0)
Monocytes Absolute: 0.5 10*3/uL (ref 0.1–1.0)
Monocytes Relative: 8 %
Neutro Abs: 2.6 10*3/uL (ref 1.7–7.7)
Neutrophils Relative %: 43 %
Platelet Count: 277 10*3/uL (ref 150–400)
RBC: 4.37 MIL/uL (ref 3.87–5.11)
RDW: 13.9 % (ref 11.5–15.5)
WBC Count: 6.1 10*3/uL (ref 4.0–10.5)
nRBC: 0 % (ref 0.0–0.2)

## 2021-06-24 MED ORDER — ACETAMINOPHEN 325 MG PO TABS
650.0000 mg | ORAL_TABLET | Freq: Once | ORAL | Status: AC
  Administered 2021-06-24: 650 mg via ORAL
  Filled 2021-06-24: qty 2

## 2021-06-24 MED ORDER — PERTUZ-TRASTUZ-HYALURON-ZZXF 60-60-2000 MG-MG-U/ML CHEMO ~~LOC~~ SOLN
10.0000 mL | Freq: Once | SUBCUTANEOUS | Status: AC
  Administered 2021-06-24: 10 mL via SUBCUTANEOUS
  Filled 2021-06-24: qty 10

## 2021-06-24 MED ORDER — DIPHENHYDRAMINE HCL 25 MG PO CAPS
50.0000 mg | ORAL_CAPSULE | Freq: Once | ORAL | Status: AC
  Administered 2021-06-24: 50 mg via ORAL
  Filled 2021-06-24: qty 2

## 2021-06-24 MED ORDER — SODIUM CHLORIDE 0.9% FLUSH
10.0000 mL | Freq: Once | INTRAVENOUS | Status: AC
  Administered 2021-06-24: 10 mL

## 2021-06-24 NOTE — Progress Notes (Signed)
Carolyn Stewart   Telephone:(336) [phone] Fax:(336) [phone]   Clinic Follow up Note   Patient Care Team: Julian Hy, PA-C as PCP - General (Physician Assistant) [name], MD as Consulting Physician (General Surgery) Truitt Merle, MD as Consulting Physician (Hematology) Kyung Rudd, MD as Consulting Physician (Radiation Oncology) Mauro Kaufmann, RN as Oncology Nurse Navigator Rockwell Germany, RN as Oncology Nurse Navigator  Date of Service:  06/24/2021  CHIEF COMPLAINT: f/u of metastatic breast cancer  CURRENT THERAPY:  -Maintenance Herceptin and Perjeta (Phesgo) q3weeks starting 06/18/20, held 09/22/20-11/24/20 due to low EF -Letrozole 2.81m daily starting 06/19/23 -Zometa starting 06/18/20, held after 09/09/2020 due to lack of insurance coverage   ASSESSMENT & PLAN:  Carolyn Pariseauis a 61y.o. female with   1. Malignant neoplasm of upper-outer quadrant of right breast, ductal carcinoma, Stage IIA, c(T3N1Mx), with bone and possible liver mets,  ER+/PR+/HER2+, Grade II-III  -She was diagnosed in 11/2019 with a large right breast mass in 4 quadrants measuring up to 11cm and right lymphadenopathy. Her right breast and LN biopsy was positive for invasive ductal carcinoma with components of DCIS. Her left axillary node biopsy was benign.  -01/01/20 Liver biopsy was negative for malignant cells but her 01/12/20 Bone Biopsy was positive for metastatic carcinoma from primary breast cancer. Her ER/PR/HER2 were all negative bone biopsy, possibly related to decalcification. -She was also seen to have left breast mass on her 12/12/19 MRI -She received THP 01/03/20-05/27/20. Her restaging PET scan from 04/28/20 showed no hypermetabolic lesions. Given her excellent response, I switched to maintenance therapy with Herceptin, Perjeta (Phesgo) q3weeks and Letrozole starting 06/18/20.   -Restaging CT and bone scan on 09/03/20 showed interval treatment response, both to osseous metastases and liver  lesions. Will continue current maintenance therapy.  -Echo in 09/2020 showed dropped EF 45-50%, treatment held temporarily. Repeat echo on 03/09/21 showed stable EF 55%, and most recently on 06/16/21 showed 55-60%. -labs reviewed, CBC and CMP WNL, adequate for treatment. She continues to tolerate well with no side effects. -Continue Phesgo injection every 3 weeks, follow-up in 9 weeks  -she wants to delay her restaging CT scan until she regains her insurance. She will call [place] that updates.   2. Bone metastases from breast cancer -bone scan on 12/10/19 showed metastatic disease in left acetabulum, L3, and right 10th rib. -01/12/20 Bone Biopsy was positive for metastatic carcinoma from primary breast cancer. -PET on 04/28/20 after THP treatment showed no active disease. -Zometa started 06/18/20, will continue every 3 months, but will hold it for now due to lack of insurance coverage.  I encouraged her to keep up with dental care. She has no active dental issues. She is on calcium and VitD -most recent bone scan on 09/03/20 showed continued improvement.   3. Social Support -she lost insurance in 11/2020. -we are postponing her restaging scan until she has insurance again. I am comfortable with this given her normal tumor marker and good clinical disposition.   4. Chemotherapy-induced cardiomyopathy -echo 09/22/20 showed decreased EF -She was started on losartan by Dr. BHaroldine Lawson 10/21/20 -repeat echo on 03/09/21 was stable.    5. HTN -Well controlled on Metoprolol and Amlodipine.    6. Covid-19 (+) 11/06/20 -She experienced cough, chest congestion, bad headaches, and night fevers. She was prescribed paxlovid and recovered well     PLAN:  -proceed with Phesgo injection today -continue letrozole -Phesgo every 3 weeks             -  she prefers [facility] for appointment on days she doesn't see me             -she prefers Friday appointments -lab and f/u in 9 weeks -will postpone her restaging  scan to when she has insurance coverage again    No problem-specific Assessment & Plan notes found for this encounter.   SUMMARY OF ONCOLOGIC HISTORY: Oncology History Overview Note  Cancer Staging Malignant neoplasm of upper-outer quadrant of right breast in female, estrogen receptor positive ([place]) Staging form: Breast, AJCC 8th Edition - Clinical stage from 11/25/2019: Stage IIA (cT3, cN1, cM0, G2, ER+, PR+, HER2+) - Signed by Truitt Merle, MD on 12/03/2019    Malignant neoplasm of upper-outer quadrant of right breast in female, estrogen receptor positive ([place])  11/13/2019 Mammogram    Diagnostic Mammogram 11/13/19  IMPRESSION: -Highly suspicious mass and calcifications in the right breast centered between 9 and 12 o'clock spanning up to 11 cm.  -Multiple masses in the right axilla. One of the masses contains calcifications, denoted as mass number 2 measuring 9 x 11 by 10 mm. Several of these masses do not appear to represent definitive lymph nodes. Others may be small but abnormal appearing lymph nodes. Taken in total, a cluster of masses in the right axilla spans up to 3.7 cm.  -There are fibrocystic changes on the left. There are also some solid-appearing masses. A solid appearing mass at 10:30, 6 cm from the nipple measures 9 x 6 x 6 mm. An adjacent solid mass at 10 o'clock, 6 cm from the nipple measures 8 x 6 by 5 mm. Another solid-appearing mass at 11 o'clock, 1 cm from the nipple measures 5 x 4 by 5 mm. Fibrocystic changes are seen at 10 o'clock, 12 o'clock, and 4 o'clock. Another solid-appearing mass seen at 5 o'clock, 4 cm from the nipple measuring 6 x 3 x 4 mm. There is a single abnormal node in the left axilla with a cortex measuring 5 mm and restriction of the fatty hilum.   11/25/2019 Initial Biopsy   Diagnosis 1. Breast, right, needle core biopsy, upper outer - INVASIVE MAMMARY CARCINOMA - MAMMARY CARCINOMA IN SITU WITH CALCIFICATIONS AND NECROSIS - SEE COMMENT 2. Lymph  node, needle/core biopsy, right axilla - INVASIVE MAMMARY CARCINOMA - SEE COMMENT 3. Lymph node, needle/core biopsy, left axilla - BENIGN LYMPH NODE - NO CARCINOMA IDENTIFIED Microscopic Comment 1. The biopsy material shows an infiltrative proliferation of cells arranged linearly and in small clusters. Based on the biopsy, the carcinoma appears Nottingham grade 2 of 3 and measures 1.2 cm in greatest linear extent. E-cadherin and prognostic markers (ER/PR/ki-67/HER2)are pending and will be reported in an addendum. Dr. Tresa Moore reviewed the case and agrees with the above diagnosis. These results were called to [facility] on November 26, 2019. 2. Definitive nodal tissue is not identified. This biopsy has a slightly different architecture morphology than what is seen in part 1. The carcinoma measures 0.7 cm in greatest dimension. E-cadherin and prognostic markers (ER, PR, Ki-67 and HER-2) are pending and will be reported in an addendum.   11/25/2019 Receptors her2   1. PROGNOSTIC INDICATORS Results: IMMUNOHISTOCHEMICAL AND MORPHOMETRIC ANALYSIS PERFORMED MANUALLY The tumor cells are POSITIVE for Her2 (3+). Estrogen Receptor: 90%, POSITIVE, STRONG STAINING INTENSITY Progesterone Receptor: 20%, POSITIVE, STRONG STAINING INTENSITY Proliferation Marker Ki67: 30%  2. PROGNOSTIC INDICATORS Results: IMMUNOHISTOCHEMICAL AND MORPHOMETRIC ANALYSIS PERFORMED MANUALLY The tumor cells are POSITIVE for Her2 (3+). Estrogen Receptor: 90%, POSITIVE, STRONG STAINING  INTENSITY Progesterone Receptor: 25%, POSITIVE, STRONG STAINING INTENSITY Proliferation Marker Ki67: 30%   11/25/2019 Cancer Staging   Staging form: Breast, AJCC 8th Edition - Clinical stage from 11/25/2019: Stage IIA (cT3, cN1, cM0, G2, ER+, PR+, HER2+) - Signed by Truitt Merle, MD on 12/03/2019    12/01/2019 Initial Diagnosis   Malignant neoplasm of upper-outer quadrant of right breast in female, estrogen receptor positive ([place])    12/10/2019 Imaging   Bone Scan  IMPRESSION: Findings concerning for bony metastatic disease in the left acetabulum region, L3 vertebral body region, and anterolateral right tenth rib.   Increased uptake in several large joints likely is of arthropathic etiology.   12/11/2019 Imaging   CT CAP w contrast  IMPRESSION: Prominent glandular tissue in the central right breast with nipple retraction, likely corresponding to the patient's known right breast neoplasm.   Prominent right axillary nodes measuring up to 8 mm short axis, suspicious for nodal metastases in this clinical context.   3 mm subpleural pulmonary nodule in the right lower lobe, likely benign. However, follow-up CT chest is suggested in 3-6 months.   Multiple hepatic lesions, including a dominant 2.4 cm lesion in segment 6, which is considered indeterminate. Metastasis is not excluded. Consider MRI abdomen with/without contrast for further characterization.   11 mm sclerotic lesion along the left posterior aspect of the T7 vertebral body raises concern for isolated osseous metastasis. Correlate with priors if available. Benign vertebral hemangioma at T11.   12/12/2019 Breast MRI   IMPRESSION: 1. The biopsy proven malignancy in the right breast involves all 4 quadrants and spans up to 9 cm by MRI. There is enhancement within the skin at the level of the nipple.   2. Multiple matted masses are seen in the right axilla, either abnormal metastatic lymph nodes or a combination of masses and metastatic lymph nodes. One of these has been previously biopsied showing invasive mammary carcinoma without definitive nodal tissue.   3.  There is a 1.5 cm Rotter's lymph node on the right.   4. There is markedly abnormal enhancement and edema in the right pectoralis major muscle spanning 6-7 cm, suspicious for metastatic disease.   5. There is diffuse nodular enhancement throughout the left breast, which may correspond  with the solid-appearing masses seen on ultrasound. These all have a similar appearance on MRI.   6.  No findings of left axillary lymphadenopathy.   12/15/2019 Procedure   PAC placed    12/24/2019 Imaging   MRI abdomen  IMPRESSION: 1. Multifocal enhancing bone lesions are noted within the lumbar spine, and bony pelvis compatible with metastatic disease. 2. Four, small peripherally enhancing cystic lesions are noted within the liver worrisome for metastatic disease. 3. 2 enhancing liver lesions are also noted with signal and enhancement characteristics consistent with benign liver hemangioma. 4. Well-circumscribed, enhancing T2 and T1 hypointense structure within the right adnexal region is indeterminate. It is unclear whether not this is arising from the right ovary, broad ligament or right side of uterine fundus. Differential considerations include fibrothecoma, versus pedunculated uterine leiomyoma. Metastatic disease is considered less favored. Attention on follow-up imaging is recommended.     01/03/2020 - 05/27/2020 Chemotherapy   First line THP q3weeks starting 01/03/20-05/27/20   01/05/2020 Imaging   US Abdomen  IMPRESSION: 1. Suspect hematoma in the medial right lobe of the liver in the area of previous biopsy. Note that recent MR does not show lesion in this area. This area has ill-defined margins and measures 3.9  x 4.0 x 3.5 cm. No perihepatic fluid.   2. Probable hemangiomas elsewhere in the right lobe of the liver. Note that several smaller lesions seen on recent MR are not appreciable by ultrasound.   3.  Study otherwise unremarkable.   01/12/2020 Pathology Results   FINAL MICROSCOPIC DIAGNOSIS:   A. BONE, SCLEROTIC LESION, LEFT ANTERIOR ILIAC SPINE, BIOPSY:  - Metastatic carcinoma, consistent with patient's clinical history of  primary breast carcinoma  - See comment   COMMENT:  Immunohistochemical stains show that the tumor cells are positive for  CK7 and  GATA3; and negative for CK20, consistent with above  interpretation.   PROGNOSTIC INDICATOR RESULTS:  The tumor cells are NEGATIVE for Her2 (0); see comment  Estrogen Receptor: NEGATIVE; see comment  Progesterone Receptor: NEGATIVE; see comment    03/23/2020 Imaging   CT CAP IMPRESSION: 1. Right lower lobe perifissural nodule is slightly more conspicuous on today's study. Close attention on follow-up recommended. 2. Interval progression of segment IV liver lesion, concerning for metastatic progression. The remaining visualized liver lesions are stable to smaller in the interval. 3. Interval evolution in appearance of thoracolumbar spinal lesions and left iliac crest lesion, potentially reflecting response to therapy/healing. No definite new osseous lesions on today's study. 4. New small fluid collection right cul-de-sac. 5. Probable fibroid change in the uterus including exophytic right fundal lesion. 6. Aortic Atherosclerosis (ICD10-I70.0).   03/31/2020 Imaging   MR ABD IMPRESSION: 1. Enlarging hepatic lesion in hepatic subsegment IV suspicious for hepatic metastasis. The it is uncertain whether the change in the short interval is due to technical factors with different modalities or true enlargement. 2. Lesion in the posterior RIGHT hepatic lobe with imaging features that are atypical for hemangioma but stability and signs of enhancement on later phase raising this question. Another more classic appearing may angioma seen inferior to this location in hepatic subsegment VI. Attention on follow-up. 3. Heterogeneous enhancement throughout the spine particularly at L3 as exhibited on the prior study with very patchy marrow signal remains suspicious for bony metastatic lesions in this patient with known bone metastases.   04/01/2020 Imaging   Bone scan IMPRESSION: Good response to therapy with resolution of sites of uptake seen previously at the cervical spine and posterior RIGHT  tenth rib as well as a diminished degree of uptake at remaining previously identified osseous metastatic foci.   04/28/2020 PET scan   IMPRESSION: 1. No hypermetabolic lesions to suggest active metastatic disease involving the liver or osseous structures. 2. Stable small low-attenuation liver lesions, likely treated disease.   06/18/2020 -  Chemotherapy   Given her excellent response, I switched to maintenance therapy with Herceptin, Perjeta (Phesgo) q3weeks and Letrozole.   06/18/2020 -  Chemotherapy   Zometa starting 06/18/20   06/18/2020 -  Anti-estrogen oral therapy   Letrozole 2.17m once dialy.    09/03/2020 Imaging   Bone Scan IMPRESSION: 1. Significant interval improvement compared to previous studies. Only mild uptake remains in the pelvis as above. There has been significant interval improvement over time.  CT C/A/P IMPRESSION: 1. Hypodense liver lesions are slightly decreased in size compared to prior examination, consistent with treatment response of hepatic metastatic disease. 2. Unchanged sclerotic lesion of the anterior left iliac crest. Unchanged lytic superior endplate deformity of L3, which remains equivocal for metastatic lesion versus mechanical Schmorl deformity. No CT evidence of new osseous metastatic disease. 3. No evidence of new metastatic disease in the chest, abdomen, or pelvis. 4. Coronary artery disease.  INTERVAL HISTORY:  Carolyn Stewart is here for a follow up of metastatic breast cancer. She was last seen by me on 04/22/21. She presents to the clinic alone. She reports she is doing well overall. She reports she feels very tired following injection, but this resolves after a few days. 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 She also reports some left-sided hip pain and notes she can't lay on that side. She notes most of the time it doesn't bother here much.   All other systems were reviewed with the patient and  are negative.  MEDICAL HISTORY:  Past Medical History:  Diagnosis Date   Cancer ([place]) 2021   Hypertension     SURGICAL HISTORY: Past Surgical History:  Procedure Laterality Date   left parotid tumor removal   2006   PORTACATH PLACEMENT Left 12/15/2019   Procedure: INSERTION PORT-A-CATH WITH ULTRASOUND GUIDANCE;  Surgeon: [name], MD;  Location: [facility];  Service: General;  Laterality: Left;   TONSILLECTOMY      I have reviewed the social history and family history with the patient and they are unchanged from previous note.  ALLERGIES:  has No Known Allergies.  MEDICATIONS:  Current Outpatient Medications  Medication Sig Dispense Refill   acetaminophen (TYLENOL) 500 MG tablet Take 1,000 mg by mouth every 6 (six) hours as needed for moderate pain or headache.     amLODipine (NORVASC) 10 MG tablet Take 2 tablets (20 mg total) by mouth daily. 60 tablet 11   Cholecalciferol (VITAMIN D3) 50 MCG (2000 UT) TABS Take 2,000 Units by mouth daily.      letrozole (FEMARA) 2.5 MG tablet Take 1 tablet (2.5 mg total) by mouth daily. 90 tablet 1   lidocaine-prilocaine (EMLA) cream Apply 1 application topically as needed. 30 g 1   losartan (COZAAR) 100 MG tablet Take 1 tablet (100 mg total) by mouth daily. 30 tablet 6   metoprolol succinate (TOPROL-XL) 100 MG 24 hr tablet Take 1 tablet (100 mg total) by mouth daily. 30 tablet 6   omeprazole (PRILOSEC OTC) 20 MG tablet Take 20 mg by mouth daily.     potassium chloride SA (KLOR-CON M20) 20 MEQ tablet Take 1 tablet (20 mEq total) by mouth 2 (two) times daily. 90 tablet 1   No current facility-administered medications for this visit.    PHYSICAL EXAMINATION: ECOG PERFORMANCE STATUS: 0 - Asymptomatic  Vitals:   06/24/21 0951  BP: 128/72  Pulse: 88  Resp: 16  Temp: 98.4 F (36.9 C)  SpO2: 99%   Wt Readings from Last 3 Encounters:  06/24/21 195 lb 11.2 oz (88.8 kg)  06/16/21 196 lb 3.2 oz (89 kg)  06/03/21 193 lb 6.4 oz (87.7 kg)      GENERAL:alert, no distress and comfortable SKIN: skin color, texture, turgor are normal, no rashes or significant lesions EYES: normal, Conjunctiva are pink and non-injected, sclera clear  NECK: supple, thyroid normal size, non-tender, without nodularity LYMPH:  no palpable lymphadenopathy in the cervical, axillary  LUNGS: clear to auscultation and percussion with normal breathing effort HEART: regular rate & rhythm and no murmurs and no lower extremity edema ABDOMEN:abdomen soft, non-tender and normal bowel sounds Musculoskeletal:no cyanosis of digits and no clubbing  NEURO: alert & oriented x 3 with fluent speech, no focal motor/sensory deficits BREAST: No palpable mass, nodules or adenopathy bilaterally. Breast exam benign.   LABORATORY DATA:  I have reviewed the data as listed CBC Latest Ref Rng &  Units 06/24/2021 04/22/2021 03/11/2021  WBC 4.0 - 10.5 K/uL 6.1 7.2 6.0  Hemoglobin 12.0 - 15.0 g/dL 12.9 12.1 13.1  Hematocrit 36.0 - 46.0 % 39.4 36.4 39.0  Platelets 150 - 400 K/uL 277 260 285     CMP Latest Ref Rng & Units 06/24/2021 04/22/2021 03/11/2021  Glucose 70 - 99 mg/dL 117(H) 115(H) 86  BUN 8 - 23 mg/dL '10 14 15  ' Creatinine 0.44 - 1.00 mg/dL 0.88 0.73 0.76  Sodium 135 - 145 mmol/L 142 140 141  Potassium 3.5 - 5.1 mmol/L 3.4(L) 3.7 3.7  Chloride 98 - 111 mmol/L 109 107 110  CO2 22 - 32 mmol/L '28 28 24  ' Calcium 8.9 - 10.3 mg/dL 9.5 9.2 9.0  Total Protein 6.5 - 8.1 g/dL 7.5 7.0 7.4  Total Bilirubin 0.3 - 1.2 mg/dL 0.5 0.4 0.3  Alkaline Phos 38 - 126 U/L 102 79 93  AST 15 - 41 U/L 13(L) 15 19  ALT 0 - 44 U/L '7 10 10      ' RADIOGRAPHIC STUDIES: I have personally reviewed the radiological images as listed and agreed with the findings in the report. No results found.    No orders of the defined types were placed in this encounter.  All questions were answered. The patient knows to call the clinic with any problems, questions or concerns. No barriers to learning was  detected.     Truitt Merle, MD 06/24/2021   I, Wilburn Mylar, am acting as scribe for Truitt Merle, MD.   I have reviewed the above documentation for accuracy and completeness, and I agree with the above.

## 2021-06-24 NOTE — Patient Instructions (Signed)
[facility]  Discharge Instructions: ?Thank you for choosing [facility] to provide your oncology and hematology care.  ? ?If you have a lab appointment with the [facility], please go directly to the [facility] and check in at the registration area. ?  ?Wear comfortable clothing and clothing appropriate for easy access to any Portacath or PICC line.  ? ?We strive to give you quality time with your provider. You may need to reschedule your appointment if you arrive late (15 or more minutes).  Arriving late affects you and other patients whose appointments are after yours.  Also, if you miss three or more appointments without notifying the office, you may be dismissed from the clinic at the provider?s discretion.    ?  ?For prescription refill requests, have your pharmacy contact our office and allow 72 hours for refills to be completed.   ? ?Today you received the following chemotherapy and/or immunotherapy agents: Phesgo    ?  ?To help prevent nausea and vomiting after your treatment, we encourage you to take your nausea medication as directed. ? ?BELOW ARE SYMPTOMS THAT SHOULD BE REPORTED IMMEDIATELY: ?*FEVER GREATER THAN 100.4 F (38 ?C) OR HIGHER ?*CHILLS OR SWEATING ?*NAUSEA AND VOMITING THAT IS NOT CONTROLLED WITH YOUR NAUSEA MEDICATION ?*UNUSUAL SHORTNESS OF BREATH ?*UNUSUAL BRUISING OR BLEEDING ?*URINARY PROBLEMS (pain or burning when urinating, or frequent urination) ?*BOWEL PROBLEMS (unusual diarrhea, constipation, pain near the anus) ?TENDERNESS IN MOUTH AND THROAT WITH OR WITHOUT PRESENCE OF ULCERS (sore throat, sores in mouth, or a toothache) ?UNUSUAL RASH, SWELLING OR PAIN  ?UNUSUAL VAGINAL DISCHARGE OR ITCHING  ? ?Items with * indicate a potential emergency and should be followed up as soon as possible or go to the Emergency Department if any problems should occur. ? ?Please show the CHEMOTHERAPY ALERT CARD or IMMUNOTHERAPY ALERT CARD at check-in to the  Emergency Department and triage nurse. ? ?Should you have questions after your visit or need to cancel or reschedule your appointment, please contact [facility]  Dept: [phone]  and follow the prompts.  Office hours are 8:00 a.m. to 4:30 p.m. Monday - Friday. Please note that voicemails left after 4:00 p.m. may not be returned until the following business day.  We are closed weekends and major holidays. You have access to a nurse at all times for urgent questions. Please call the main number to the clinic Dept: [phone] and follow the prompts. ? ? ?For any non-urgent questions, you may also contact your provider using MyChart. We now offer e-Visits for anyone 34 and older to request care online for non-urgent symptoms. For details visit [email or web address]. ?  ?Also download the MyChart app! Go to the app store, search "MyChart", open the app, select [facility], and log in with your MyChart username and password. ? ?Due to Covid, a mask is required upon entering the hospital/clinic. If you do not have a mask, one will be given to you upon arrival. For doctor visits, patients may have 1 support person aged 38 or older with them. For treatment visits, patients cannot have anyone with them due to current Covid guidelines and our immunocompromised population.  ? ?

## 2021-06-28 ENCOUNTER — Telehealth: Payer: Self-pay | Admitting: Oncology

## 2021-06-28 NOTE — Telephone Encounter (Signed)
Attempted to contact patient in regards to in basket msg, no answer so voicemail was left for patient to call back to schedule. ?

## 2021-06-28 NOTE — Progress Notes (Signed)
Patient receives Phesgo thru [facility] as patient replacement.  Good thru 10/21/21 then will be required for renewal if medication is continued. ? ?See FYI and media document for details. ? ?Henreitta Leber, PharmD ?06/28/21 @ 1030 ?

## 2021-07-12 ENCOUNTER — Other Ambulatory Visit: Payer: Self-pay | Admitting: Hematology

## 2021-07-15 ENCOUNTER — Ambulatory Visit: Payer: Self-pay

## 2021-07-15 ENCOUNTER — Other Ambulatory Visit: Payer: Self-pay

## 2021-07-15 ENCOUNTER — Inpatient Hospital Stay: Payer: Self-pay

## 2021-07-15 VITALS — BP 116/76 | HR 76 | Temp 97.5°F | Resp 18 | Ht 67.0 in | Wt 196.8 lb

## 2021-07-15 DIAGNOSIS — Z170 Estrogen receptor positive status [ER+]: Secondary | ICD-10-CM

## 2021-07-15 MED ORDER — DIPHENHYDRAMINE HCL 25 MG PO CAPS
50.0000 mg | ORAL_CAPSULE | Freq: Once | ORAL | Status: AC
  Administered 2021-07-15: 50 mg via ORAL
  Filled 2021-07-15: qty 2

## 2021-07-15 MED ORDER — PERTUZ-TRASTUZ-HYALURON-ZZXF 60-60-2000 MG-MG-U/ML CHEMO ~~LOC~~ SOLN
10.0000 mL | Freq: Once | SUBCUTANEOUS | Status: AC
  Administered 2021-07-15: 10 mL via SUBCUTANEOUS
  Filled 2021-07-15: qty 10

## 2021-07-15 MED ORDER — ACETAMINOPHEN 325 MG PO TABS
650.0000 mg | ORAL_TABLET | Freq: Once | ORAL | Status: AC
  Administered 2021-07-15: 650 mg via ORAL
  Filled 2021-07-15: qty 2

## 2021-07-15 NOTE — Progress Notes (Signed)
Patient presents for treatment. RN assessment completed along with the following: ? ?Labs/vitals reviewed - Yes, and VS within treatment parameters.  No labs needed for treatment.     ?Weight within 10% of previous measurement - Yes ?Informed consent completed and reflects current therapy/intent - Yes, on date 06/18/20             ?Provider progress note reviewed - Patient not seen by provider today. Most recent note dated 06/24/21 reviewed. ?Treatment/Antibody/Supportive plan reviewed - Yes, and there are no adjustments needed for today's treatment. ?S&H and other orders reviewed - Yes, and there are no additional orders identified. ?Previous treatment date reviewed - Yes, and the appropriate amount of time has elapsed between treatments. ? ?Patient to proceed with treatment.   ? ?Patient monitored for 15 minutes post injection. VSS upon leaving infusion room.  ?

## 2021-07-15 NOTE — Patient Instructions (Signed)
Pertuzumab; Trastuzumab; Hyaluronidase Injection ?What is this medication? ?PERTUZUMAB (per TOOZ ue mab); TRASTUZUMAB (tras TOO zoo mab); HYALURONIDASE (hye al ur ON i dase) is used to treat breast cancer. Pertuzumab and trastuzumab are a monoclonal antibodies. Hyaluronidase is used to improve the effects of pertuzumab and trastuzumab. ?This medicine may be used for other purposes; ask your health care provider or pharmacist if you have questions. ?COMMON BRAND NAME(S): PHESGO ?What should I tell my care team before I take this medication? ?They need to know if you have any of these conditions: ?heart disease ?high blood pressure ?history of irregular heartbeat ?lung or breathing disease, like asthma ?an unusual or allergic reaction to pertuzumab, trastuzumab, hyaluronidase, other medicines, foods, dyes, or preservatives ?pregnant or trying to get pregnant ?breast-feeding ?How should I use this medication? ?This medicine is for injection under the skin. It may be given by a health care professional in a hospital or clinic setting or a health care professional may give you this medicine at home. ?Talk to your pediatrician regarding the use of this medicine in children. Special care may be needed. ?Overdosage: If you think you have taken too much of this medicine contact a poison control center or emergency room at once. ?NOTE: This medicine is only for you. Do not share this medicine with others. ?What if I miss a dose? ?It is important not to miss your dose. Call your doctor or health care professional if you are unable to keep an appointment. ?What may interact with this medication? ?This medicine may interact with the following medications: ?certain types of chemotherapy, such as daunorubicin, doxorubicin, epirubicin, and idarubicin ?This list may not describe all possible interactions. Give your health care provider a list of all the medicines, herbs, non-prescription drugs, or dietary supplements you use. Also  tell them if you smoke, drink alcohol, or use illegal drugs. Some items may interact with your medicine. ?What should I watch for while using this medication? ?Visit your health care professional for regular checks on your progress. Tell your health care professional if your symptoms do not start to get better or if they get worse. Your condition will be monitored carefully while you are receiving this medicine. ?Do not become pregnant while taking this medicine or for 7 months after stopping it. Women should inform their doctor if they wish to become pregnant or think they might be pregnant. There is a potential for serious side effects to an unborn child. Talk to your health care professional or pharmacist for more information. ?What side effects may I notice from receiving this medication? ?Side effects that you should report to your doctor or health care professional as soon as possible: ?allergic reactions like skin rash, itching or hives, swelling of the face, lips, or tongue ?breathing problems ?chest pain ?cough ?dizziness ?feeling faint; lightheaded, falls ?fever or chills ?loss of consciousness ?pain, tingling, numbness in the hands or feet ?palpitations ?sudden weight gain ?swelling of the ankles or legs ?unusually weak or tired ?Side effects that usually do not require medical attention (report these to your doctor or health care professional if they continue or are bothersome): ?diarrhea ?hair loss ?joint pain ?muscle pain ?nausea, vomiting ?pain, redness, or irritation at site where injected ?tiredness ?This list may not describe all possible side effects. Call your doctor for medical advice about side effects. You may report side effects to FDA at 1-800-FDA-1088. ?Where should I keep my medication? ?Keep out of the reach of children. ?If you are using this  medicine at home, you will be instructed on how to store it. Throw away any unused medicine after the expiration date on the label. ?NOTE: This  sheet is a summary. It may not cover all possible information. If you have questions about this medicine, talk to your doctor, pharmacist, or health care provider. ?? 2022 Elsevier/Gold Standard (2020-12-28 00:00:00) ? ?

## 2021-08-05 ENCOUNTER — Ambulatory Visit: Payer: Self-pay

## 2021-08-05 ENCOUNTER — Inpatient Hospital Stay: Payer: Self-pay | Attending: Hematology

## 2021-08-05 VITALS — BP 115/72 | HR 75 | Temp 97.4°F | Resp 18 | Ht 67.0 in | Wt 196.6 lb

## 2021-08-05 DIAGNOSIS — Z5111 Encounter for antineoplastic chemotherapy: Secondary | ICD-10-CM | POA: Insufficient documentation

## 2021-08-05 DIAGNOSIS — C7951 Secondary malignant neoplasm of bone: Secondary | ICD-10-CM | POA: Insufficient documentation

## 2021-08-05 DIAGNOSIS — C50411 Malignant neoplasm of upper-outer quadrant of right female breast: Secondary | ICD-10-CM | POA: Insufficient documentation

## 2021-08-05 DIAGNOSIS — Z170 Estrogen receptor positive status [ER+]: Secondary | ICD-10-CM | POA: Insufficient documentation

## 2021-08-05 MED ORDER — DIPHENHYDRAMINE HCL 25 MG PO CAPS
50.0000 mg | ORAL_CAPSULE | Freq: Once | ORAL | Status: AC
  Administered 2021-08-05: 50 mg via ORAL
  Filled 2021-08-05: qty 2

## 2021-08-05 MED ORDER — ACETAMINOPHEN 325 MG PO TABS
650.0000 mg | ORAL_TABLET | Freq: Once | ORAL | Status: AC
  Administered 2021-08-05: 650 mg via ORAL
  Filled 2021-08-05: qty 2

## 2021-08-05 MED ORDER — PERTUZ-TRASTUZ-HYALURON-ZZXF 60-60-2000 MG-MG-U/ML CHEMO ~~LOC~~ SOLN
10.0000 mL | Freq: Once | SUBCUTANEOUS | Status: AC
  Administered 2021-08-05: 10 mL via SUBCUTANEOUS
  Filled 2021-08-05: qty 10

## 2021-08-05 NOTE — Patient Instructions (Signed)
Pertuzumab; Trastuzumab; Hyaluronidase Injection ?What is this medication? ?PERTUZUMAB (per TOOZ ue mab); TRASTUZUMAB (tras TOO zoo mab); HYALURONIDASE (hye al ur ON i dase) is used to treat breast cancer. Pertuzumab and trastuzumab are a monoclonal antibodies. Hyaluronidase is used to improve the effects of pertuzumab and trastuzumab. ?This medicine may be used for other purposes; ask your health care provider or pharmacist if you have questions. ?COMMON BRAND NAME(S): PHESGO ?What should I tell my care team before I take this medication? ?They need to know if you have any of these conditions: ?heart disease ?high blood pressure ?history of irregular heartbeat ?lung or breathing disease, like asthma ?an unusual or allergic reaction to pertuzumab, trastuzumab, hyaluronidase, other medicines, foods, dyes, or preservatives ?pregnant or trying to get pregnant ?breast-feeding ?How should I use this medication? ?This medicine is for injection under the skin. It may be given by a health care professional in a hospital or clinic setting or a health care professional may give you this medicine at home. ?Talk to your pediatrician regarding the use of this medicine in children. Special care may be needed. ?Overdosage: If you think you have taken too much of this medicine contact a poison control center or emergency room at once. ?NOTE: This medicine is only for you. Do not share this medicine with others. ?What if I miss a dose? ?It is important not to miss your dose. Call your doctor or health care professional if you are unable to keep an appointment. ?What may interact with this medication? ?This medicine may interact with the following medications: ?certain types of chemotherapy, such as daunorubicin, doxorubicin, epirubicin, and idarubicin ?This list may not describe all possible interactions. Give your health care provider a list of all the medicines, herbs, non-prescription drugs, or dietary supplements you use. Also  tell them if you smoke, drink alcohol, or use illegal drugs. Some items may interact with your medicine. ?What should I watch for while using this medication? ?Visit your health care professional for regular checks on your progress. Tell your health care professional if your symptoms do not start to get better or if they get worse. Your condition will be monitored carefully while you are receiving this medicine. ?Do not become pregnant while taking this medicine or for 7 months after stopping it. Women should inform their doctor if they wish to become pregnant or think they might be pregnant. There is a potential for serious side effects to an unborn child. Talk to your health care professional or pharmacist for more information. ?What side effects may I notice from receiving this medication? ?Side effects that you should report to your doctor or health care professional as soon as possible: ?allergic reactions like skin rash, itching or hives, swelling of the face, lips, or tongue ?breathing problems ?chest pain ?cough ?dizziness ?feeling faint; lightheaded, falls ?fever or chills ?loss of consciousness ?pain, tingling, numbness in the hands or feet ?palpitations ?sudden weight gain ?swelling of the ankles or legs ?unusually weak or tired ?Side effects that usually do not require medical attention (report these to your doctor or health care professional if they continue or are bothersome): ?diarrhea ?hair loss ?joint pain ?muscle pain ?nausea, vomiting ?pain, redness, or irritation at site where injected ?tiredness ?This list may not describe all possible side effects. Call your doctor for medical advice about side effects. You may report side effects to FDA at 1-800-FDA-1088. ?Where should I keep my medication? ?Keep out of the reach of children. ?If you are using this  medicine at home, you will be instructed on how to store it. Throw away any unused medicine after the expiration date on the label. ?NOTE: This  sheet is a summary. It may not cover all possible information. If you have questions about this medicine, talk to your doctor, pharmacist, or health care provider. ?? 2022 Elsevier/Gold Standard (2020-12-28 00:00:00) ? ?

## 2021-08-05 NOTE — Progress Notes (Signed)
Patient presents for treatment. RN assessment completed along with the following: ? ?Labs/vitals reviewed - Yes, and within treatment parameters.   ?Weight within 10% of previous measurement - Yes ?Informed consent completed and reflects current therapy/intent - Yes, on date 06/18/20             ?Provider progress note reviewed - Patient not seen by provider today. Most recent note dated 06/24/21 reviewed. ?Treatment/Antibody/Supportive plan reviewed - Yes, and there are no adjustments needed for today's treatment. ?S&H and other orders reviewed - Yes, and there are no additional orders identified. ?Previous treatment date reviewed - Yes, and the appropriate amount of time has elapsed between treatments. ? ? ?Patient to proceed with treatment.   ? ?Patient monitored for 15 minutes post injection. VSS upon leaving infusion room.  ?

## 2021-08-26 ENCOUNTER — Other Ambulatory Visit: Payer: Self-pay

## 2021-08-26 ENCOUNTER — Encounter: Payer: Self-pay | Admitting: Hematology

## 2021-08-26 ENCOUNTER — Inpatient Hospital Stay: Payer: Self-pay | Attending: Hematology

## 2021-08-26 ENCOUNTER — Inpatient Hospital Stay: Payer: Self-pay

## 2021-08-26 ENCOUNTER — Telehealth: Payer: Self-pay | Admitting: Hematology

## 2021-08-26 ENCOUNTER — Inpatient Hospital Stay (HOSPITAL_BASED_OUTPATIENT_CLINIC_OR_DEPARTMENT_OTHER): Payer: Self-pay | Admitting: Hematology

## 2021-08-26 VITALS — BP 132/83 | HR 89 | Temp 98.1°F | Resp 18 | Wt 197.2 lb

## 2021-08-26 DIAGNOSIS — Z170 Estrogen receptor positive status [ER+]: Secondary | ICD-10-CM

## 2021-08-26 DIAGNOSIS — C7951 Secondary malignant neoplasm of bone: Secondary | ICD-10-CM | POA: Insufficient documentation

## 2021-08-26 DIAGNOSIS — C50411 Malignant neoplasm of upper-outer quadrant of right female breast: Secondary | ICD-10-CM

## 2021-08-26 DIAGNOSIS — C787 Secondary malignant neoplasm of liver and intrahepatic bile duct: Secondary | ICD-10-CM | POA: Insufficient documentation

## 2021-08-26 DIAGNOSIS — G620 Drug-induced polyneuropathy: Secondary | ICD-10-CM | POA: Insufficient documentation

## 2021-08-26 DIAGNOSIS — Z5111 Encounter for antineoplastic chemotherapy: Secondary | ICD-10-CM | POA: Insufficient documentation

## 2021-08-26 LAB — CMP (CANCER CENTER ONLY)
ALT: 10 U/L (ref 0–44)
AST: 14 U/L — ABNORMAL LOW (ref 15–41)
Albumin: 4.3 g/dL (ref 3.5–5.0)
Alkaline Phosphatase: 109 U/L (ref 38–126)
Anion gap: 6 (ref 5–15)
BUN: 14 mg/dL (ref 8–23)
CO2: 27 mmol/L (ref 22–32)
Calcium: 9.3 mg/dL (ref 8.9–10.3)
Chloride: 109 mmol/L (ref 98–111)
Creatinine: 0.83 mg/dL (ref 0.44–1.00)
GFR, Estimated: >60 mL/min (ref >60)
Glucose, Bld: 135 mg/dL — ABNORMAL HIGH (ref 70–99)
Potassium: 3.4 mmol/L — ABNORMAL LOW (ref 3.5–5.1)
Sodium: 142 mmol/L (ref 135–145)
Total Bilirubin: 0.5 mg/dL (ref 0.3–1.2)
Total Protein: 7.6 g/dL (ref 6.5–8.1)

## 2021-08-26 LAB — CBC WITH DIFFERENTIAL (CANCER CENTER ONLY)
Abs Immature Granulocytes: 0.01 10*3/uL (ref 0.00–0.07)
Basophils Absolute: 0 10*3/uL (ref 0.0–0.1)
Basophils Relative: 1 %
Eosinophils Absolute: 0.2 10*3/uL (ref 0.0–0.5)
Eosinophils Relative: 4 %
HCT: 40.1 % (ref 36.0–46.0)
Hemoglobin: 13.1 g/dL (ref 12.0–15.0)
Immature Granulocytes: 0 %
Lymphocytes Relative: 43 %
Lymphs Abs: 2.8 10*3/uL (ref 0.7–4.0)
MCH: 29.6 pg (ref 26.0–34.0)
MCHC: 32.7 g/dL (ref 30.0–36.0)
MCV: 90.5 fL (ref 80.0–100.0)
Monocytes Absolute: 0.5 10*3/uL (ref 0.1–1.0)
Monocytes Relative: 7 %
Neutro Abs: 3 10*3/uL (ref 1.7–7.7)
Neutrophils Relative %: 45 %
Platelet Count: 276 10*3/uL (ref 150–400)
RBC: 4.43 MIL/uL (ref 3.87–5.11)
RDW: 13.4 % (ref 11.5–15.5)
WBC Count: 6.5 10*3/uL (ref 4.0–10.5)
nRBC: 0 % (ref 0.0–0.2)

## 2021-08-26 MED ORDER — PERTUZ-TRASTUZ-HYALURON-ZZXF 60-60-2000 MG-MG-U/ML CHEMO ~~LOC~~ SOLN
10.0000 mL | Freq: Once | SUBCUTANEOUS | Status: AC
  Administered 2021-08-26: 10 mL via SUBCUTANEOUS
  Filled 2021-08-26: qty 10

## 2021-08-26 MED ORDER — POTASSIUM CHLORIDE CRYS ER 10 MEQ PO TBCR: 10.0000 meq | EXTENDED_RELEASE_TABLET | Freq: Every day | ORAL | 1 refills | Status: DC

## 2021-08-26 MED ORDER — ACETAMINOPHEN 325 MG PO TABS
650.0000 mg | ORAL_TABLET | Freq: Once | ORAL | Status: AC
  Administered 2021-08-26: 650 mg via ORAL
  Filled 2021-08-26: qty 2

## 2021-08-26 MED ORDER — LETROZOLE 2.5 MG PO TABS: 2.5000 mg | ORAL_TABLET | Freq: Every day | ORAL | 1 refills | Status: DC

## 2021-08-26 MED ORDER — DIPHENHYDRAMINE HCL 25 MG PO CAPS
50.0000 mg | ORAL_CAPSULE | Freq: Once | ORAL | Status: AC
  Administered 2021-08-26: 50 mg via ORAL
  Filled 2021-08-26: qty 2

## 2021-08-26 MED ORDER — SODIUM CHLORIDE 0.9% FLUSH
10.0000 mL | Freq: Once | INTRAVENOUS | Status: AC
  Administered 2021-08-26: 10 mL

## 2021-08-26 MED ORDER — GABAPENTIN 100 MG PO CAPS: 100.0000 mg | ORAL_CAPSULE | Freq: Every day | ORAL | 2 refills | Status: DC

## 2021-08-26 NOTE — Progress Notes (Signed)
?[facility]   ?[phone] [phone]   ?Clinic Follow up Note  ? ?Patient Care Team: ?Vinnie Langton as PCP - General (Physician Assistant) ?[name], MD as Consulting Physician (General Surgery) ?Truitt Merle, MD as Consulting Physician (Hematology) ?Kyung Rudd, MD as Consulting Physician (Radiation Oncology) ?Mauro Kaufmann, RN as Oncology Nurse Navigator ?Rockwell Germany, RN as Oncology Nurse Navigator ? ?Date of Service:  08/26/2021 ? ?CHIEF COMPLAINT: f/u of metastatic breast cancer ? ?CURRENT THERAPY:  ?-Maintenance Herceptin and Perjeta (Phesgo) q3weeks starting 06/18/20, held 09/22/20-11/24/20 due to low EF ?-Letrozole 2.90m daily starting 06/18/20 ?-Zometa starting 06/18/20, held after 09/09/20 due to lack of insurance coverage  ? ?ASSESSMENT & PLAN:  ?KJaselle Pryeris a 61y.o. post-menopausal female with  ? ?1. Malignant neoplasm of upper-outer quadrant of right breast, ductal carcinoma, Stage IIA, c(T3N1Mx), with bone and possible liver mets,  ER+/PR+/HER2+, Grade II-III  ?-She was diagnosed in 11/2019 with a large right breast mass in 4 quadrants measuring up to 11cm and right lymphadenopathy. Her right breast and LN biopsy was positive for invasive ductal carcinoma with components of DCIS. Her left axillary node biopsy was benign.  ?-01/01/20 Liver biopsy was negative for malignant cells but her 01/12/20 Bone Biopsy was positive for metastatic carcinoma from primary breast cancer. Her ER/PR/HER2 were all negative bone biopsy, possibly related to decalcification. ?-She was also seen to have left breast mass on her 12/12/19 MRI ?-She received THP 01/03/20-05/27/20. Her restaging PET scan from 04/28/20 showed no hypermetabolic lesions. Given her excellent response, I switched to maintenance therapy with Herceptin, Perjeta (Phesgo) q3weeks and Letrozole starting 06/18/20.   ?-Restaging CT and bone scan on 09/03/20 showed interval treatment response, both to osseous  metastases and liver lesions. Will continue current maintenance therapy.  ?-Echo in 09/2020 showed dropped EF 45-50%, treatment held temporarily. Repeat echo on 03/09/21 showed stable EF 55%, and most recently on 06/16/21 showed 55-60%. ?-labs reviewed, CBC and CMP WNL, adequate for treatment. She continues to tolerate well with no side effects. Physical exam showed no palpable breast masses. ?-Continue Phesgo injection every 3 weeks, follow-up in 9 weeks  ?-we will continue to delay her restaging CT scan until she regains her insurance. She tells me they should get coverage any time now. ?  ?2. Bone metastases from breast cancer ?-bone scan on 12/10/19 showed metastatic disease in left acetabulum, L3, and right 10th rib. ?-01/12/20 Bone Biopsy was positive for metastatic carcinoma from primary breast cancer. ?-PET on 04/28/20 after THP treatment showed no active disease. ?-Zometa started 06/18/20, will continue every 3 months, but will hold it for now due to lack of insurance coverage.  I encouraged her to keep up with dental care. She has no active dental issues. She is on calcium and VitD ?-most recent bone scan on 09/03/20 showed continued improvement. ?  ?3. Social Support ?-she lost insurance in 11/2020. ?-we are postponing her restaging scan until she has insurance again. I am comfortable with this given her normal tumor marker and good clinical disposition. ?  ?4. Chemotherapy-induced cardiomyopathy, Neuropathy ?-echo 09/22/20 showed decreased EF, she was started on losartan by Dr. BHaroldine Lawson 10/21/20 ?-most recent echo on 06/16/21 was stable.  ?-she continues to have some mild neuropathy in her fingers and feet from prior chemo, only bothersome at night. I offered gabapentin, she would like to try. ?  ?5. HTN ?-Well controlled on Metoprolol and Amlodipine.  ?  ?6. Covid-19 (+) 11/06/20 ?-She  experienced cough, chest congestion, bad headaches, and night fevers. She was prescribed paxlovid and recovered well ?  ?  ?PLAN:   ?-proceed with Phesgo injection today ?-continue letrozole ?-I called in gabapentin and refilled letrozole and KCL ?-Phesgo every 3 weeks ?            -she prefers [facility] for appointment on days she doesn't see me ?            -she prefers Friday appointments ?-lab and f/u in 9 weeks ?-will postpone her restaging scan and zometa in fusion to when she has insurance coverage again  ? ? ?No problem-specific Assessment & Plan notes found for this encounter. ? ? ?SUMMARY OF ONCOLOGIC HISTORY: ?Oncology History Overview Note  ?Cancer Staging ?Malignant neoplasm of upper-outer quadrant of right breast in female, estrogen receptor positive ([place]) ?Staging form: Breast, AJCC 8th Edition ?- Clinical stage from 11/25/2019: Stage IIA (cT3, cN1, cM0, G2, ER+, PR+, HER2+) - Signed by Truitt Merle, MD on 12/03/2019 ? ?  ?Malignant neoplasm of upper-outer quadrant of right breast in female, estrogen receptor positive ([place])  ?11/13/2019 Mammogram  ?  ?Diagnostic Mammogram 11/13/19  ?IMPRESSION: ?-Highly suspicious mass and calcifications in the right breast centered between 9 and 12 o'clock spanning up to 11 cm.  ?-Multiple masses in the right axilla. One of the masses contains calcifications, denoted as mass number 2 measuring 9 x 11 by 10 mm. Several of these masses do not appear to represent definitive lymph nodes. Others may be small but abnormal ?appearing lymph nodes. Taken in total, a cluster of masses in the ?right axilla spans up to 3.7 cm.  ?-There are fibrocystic changes on the left. There are also some ?solid-appearing masses. A solid appearing mass at 10:30, 6 cm from ?the nipple measures 9 x 6 x 6 mm. An adjacent solid mass at 10 ?o'clock, 6 cm from the nipple measures 8 x 6 by 5 mm. Another ?solid-appearing mass at 11 o'clock, 1 cm from the nipple measures 5 ?x 4 by 5 mm. Fibrocystic changes are seen at 10 o'clock, 12 o'clock, ?and 4 o'clock. Another solid-appearing mass seen at 5 o'clock, 4 cm ?from the nipple measuring  6 x 3 x 4 mm. There is a single abnormal ?node in the left axilla with a cortex measuring 5 mm and restriction ?of the fatty hilum. ?  ?11/25/2019 Initial Biopsy  ? Diagnosis ?1. Breast, right, needle core biopsy, upper outer ?- INVASIVE MAMMARY CARCINOMA ?- MAMMARY CARCINOMA IN SITU WITH CALCIFICATIONS AND NECROSIS ?- SEE COMMENT ?2. Lymph node, needle/core biopsy, right axilla ?- INVASIVE MAMMARY CARCINOMA ?- SEE COMMENT ?3. Lymph node, needle/core biopsy, left axilla ?- BENIGN LYMPH NODE ?- NO CARCINOMA IDENTIFIED ?Microscopic Comment ?1. The biopsy material shows an infiltrative proliferation of cells arranged linearly and in small clusters. Based on the ?biopsy, the carcinoma appears Nottingham grade 2 of 3 and measures 1.2 cm in greatest linear extent. E-cadherin ?and prognostic markers (ER/PR/ki-67/HER2)are pending and will be reported in an addendum. Dr. Tresa Moore reviewed ?the case and agrees with the above diagnosis. These results were called to [facility] on ?November 26, 2019. ?2. Definitive nodal tissue is not identified. This biopsy has a slightly different architecture morphology than what is ?seen in part 1. The carcinoma measures 0.7 cm in greatest dimension. E-cadherin and prognostic markers (ER, PR, ?Ki-67 and HER-2) are pending and will be reported in an addendum. ?  ?11/25/2019 Receptors her2  ? 1. PROGNOSTIC INDICATORS ?  Results: ?IMMUNOHISTOCHEMICAL AND MORPHOMETRIC ANALYSIS PERFORMED MANUALLY ?The tumor cells are POSITIVE for Her2 (3+). ?Estrogen Receptor: 90%, POSITIVE, STRONG STAINING INTENSITY ?Progesterone Receptor: 20%, POSITIVE, STRONG STAINING INTENSITY ?Proliferation Marker Ki67: 30% ? ?2. PROGNOSTIC INDICATORS ?Results: ?IMMUNOHISTOCHEMICAL AND MORPHOMETRIC ANALYSIS PERFORMED MANUALLY ?The tumor cells are POSITIVE for Her2 (3+). ?Estrogen Receptor: 90%, POSITIVE, STRONG STAINING INTENSITY ?Progesterone Receptor: 25%, POSITIVE, STRONG STAINING INTENSITY ?Proliferation Marker  Ki67: 30% ?  ?11/25/2019 Cancer Staging  ? Staging form: Breast, AJCC 8th Edition ?- Clinical stage from 11/25/2019: Stage IIA (cT3, cN1, cM0, G2, ER+, PR+, HER2+) - Signed by Truitt Merle, MD on 12/03/2019 ? ?  ?8/

## 2021-08-26 NOTE — Telephone Encounter (Signed)
Scheduled follow-up appointment per 5/5 los. Patient is aware. Sent message to [facility] to schedule follow-up appointments in 3 and 6 weeks. ?

## 2021-09-16 ENCOUNTER — Inpatient Hospital Stay: Payer: Self-pay

## 2021-09-16 VITALS — BP 114/74 | HR 69 | Temp 97.0°F | Resp 18 | Ht 67.0 in | Wt 198.2 lb

## 2021-09-16 DIAGNOSIS — Z170 Estrogen receptor positive status [ER+]: Secondary | ICD-10-CM

## 2021-09-16 MED ORDER — PERTUZ-TRASTUZ-HYALURON-ZZXF 60-60-2000 MG-MG-U/ML CHEMO ~~LOC~~ SOLN
10.0000 mL | Freq: Once | SUBCUTANEOUS | Status: AC
  Administered 2021-09-16: 10 mL via SUBCUTANEOUS
  Filled 2021-09-16: qty 10

## 2021-09-16 MED ORDER — DIPHENHYDRAMINE HCL 25 MG PO CAPS
50.0000 mg | ORAL_CAPSULE | Freq: Once | ORAL | Status: AC
  Administered 2021-09-16: 50 mg via ORAL
  Filled 2021-09-16: qty 2

## 2021-09-16 MED ORDER — ACETAMINOPHEN 325 MG PO TABS
650.0000 mg | ORAL_TABLET | Freq: Once | ORAL | Status: AC
  Administered 2021-09-16: 650 mg via ORAL
  Filled 2021-09-16: qty 2

## 2021-09-16 NOTE — Patient Instructions (Signed)
Pertuzumab; Trastuzumab; Hyaluronidase Injection What is this medication? PERTUZUMAB (per TOOZ ue mab); TRASTUZUMAB (tras TOO zoo mab); HYALURONIDASE (hye al ur ON i dase) is used to treat breast cancer. Pertuzumab and trastuzumab are a monoclonal antibodies. Hyaluronidase is used to improve the effects of pertuzumab and trastuzumab. This medicine may be used for other purposes; ask your health care provider or pharmacist if you have questions. COMMON BRAND NAME(S): PHESGO What should I tell my care team before I take this medication? They need to know if you have any of these conditions: heart disease high blood pressure history of irregular heartbeat lung or breathing disease, like asthma an unusual or allergic reaction to pertuzumab, trastuzumab, hyaluronidase, other medicines, foods, dyes, or preservatives pregnant or trying to get pregnant breast-feeding How should I use this medication? This medicine is for injection under the skin. It may be given by a health care professional in a hospital or clinic setting or a health care professional may give you this medicine at home. Talk to your pediatrician regarding the use of this medicine in children. Special care may be needed. Overdosage: If you think you have taken too much of this medicine contact a poison control center or emergency room at once. NOTE: This medicine is only for you. Do not share this medicine with others. What if I miss a dose? It is important not to miss your dose. Call your doctor or health care professional if you are unable to keep an appointment. What may interact with this medication? This medicine may interact with the following medications: certain types of chemotherapy, such as daunorubicin, doxorubicin, epirubicin, and idarubicin This list may not describe all possible interactions. Give your health care provider a list of all the medicines, herbs, non-prescription drugs, or dietary supplements you use. Also  tell them if you smoke, drink alcohol, or use illegal drugs. Some items may interact with your medicine. What should I watch for while using this medication? Visit your health care professional for regular checks on your progress. Tell your health care professional if your symptoms do not start to get better or if they get worse. Your condition will be monitored carefully while you are receiving this medicine. Do not become pregnant while taking this medicine or for 7 months after stopping it. Women should inform their doctor if they wish to become pregnant or think they might be pregnant. There is a potential for serious side effects to an unborn child. Talk to your health care professional or pharmacist for more information. What side effects may I notice from receiving this medication? Side effects that you should report to your doctor or health care professional as soon as possible: allergic reactions like skin rash, itching or hives, swelling of the face, lips, or tongue breathing problems chest pain cough dizziness feeling faint; lightheaded, falls fever or chills loss of consciousness pain, tingling, numbness in the hands or feet palpitations sudden weight gain swelling of the ankles or legs unusually weak or tired Side effects that usually do not require medical attention (report these to your doctor or health care professional if they continue or are bothersome): diarrhea hair loss joint pain muscle pain nausea, vomiting pain, redness, or irritation at site where injected tiredness This list may not describe all possible side effects. Call your doctor for medical advice about side effects. You may report side effects to FDA at 1-800-FDA-1088. Where should I keep my medication? Keep out of the reach of children. If you are using this  medicine at home, you will be instructed on how to store it. Throw away any unused medicine after the expiration date on the label. NOTE: This  sheet is a summary. It may not cover all possible information. If you have questions about this medicine, talk to your doctor, pharmacist, or health care provider.  2022 Elsevier/Gold Standard (2020-12-28 00:00:00)

## 2021-10-07 ENCOUNTER — Inpatient Hospital Stay: Payer: Self-pay | Attending: Hematology

## 2021-10-07 VITALS — BP 115/64 | HR 75 | Temp 97.3°F | Resp 16 | Ht 67.0 in | Wt 200.4 lb

## 2021-10-07 DIAGNOSIS — Z170 Estrogen receptor positive status [ER+]: Secondary | ICD-10-CM | POA: Insufficient documentation

## 2021-10-07 DIAGNOSIS — Z5111 Encounter for antineoplastic chemotherapy: Secondary | ICD-10-CM | POA: Insufficient documentation

## 2021-10-07 DIAGNOSIS — C7951 Secondary malignant neoplasm of bone: Secondary | ICD-10-CM | POA: Insufficient documentation

## 2021-10-07 DIAGNOSIS — C50411 Malignant neoplasm of upper-outer quadrant of right female breast: Secondary | ICD-10-CM | POA: Insufficient documentation

## 2021-10-07 MED ORDER — ACETAMINOPHEN 325 MG PO TABS
650.0000 mg | ORAL_TABLET | Freq: Once | ORAL | Status: AC
  Administered 2021-10-07: 650 mg via ORAL
  Filled 2021-10-07: qty 2

## 2021-10-07 MED ORDER — PERTUZ-TRASTUZ-HYALURON-ZZXF 60-60-2000 MG-MG-U/ML CHEMO ~~LOC~~ SOLN
10.0000 mL | Freq: Once | SUBCUTANEOUS | Status: AC
  Administered 2021-10-07: 10 mL via SUBCUTANEOUS
  Filled 2021-10-07: qty 10

## 2021-10-07 MED ORDER — DIPHENHYDRAMINE HCL 25 MG PO CAPS
50.0000 mg | ORAL_CAPSULE | Freq: Once | ORAL | Status: AC
  Administered 2021-10-07: 50 mg via ORAL
  Filled 2021-10-07: qty 2

## 2021-10-07 NOTE — Patient Instructions (Signed)
[facility]   Discharge Instructions: Thank you for choosing [facility] to provide your oncology and hematology care.   If you have a lab appointment with the [facility], please go directly to the [facility] and check in at the registration area.   Wear comfortable clothing and clothing appropriate for easy access to any Portacath or PICC line.   We strive to give you quality time with your provider. You may need to reschedule your appointment if you arrive late (15 or more minutes).  Arriving late affects you and other patients whose appointments are after yours.  Also, if you miss three or more appointments without notifying the office, you may be dismissed from the clinic at the provider's discretion.      For prescription refill requests, have your pharmacy contact our office and allow 72 hours for refills to be completed.    Today you received the following chemotherapy and/or immunotherapy agents [place].      To help prevent nausea and vomiting after your treatment, we encourage you to take your nausea medication as directed.  BELOW ARE SYMPTOMS THAT SHOULD BE REPORTED IMMEDIATELY: *FEVER GREATER THAN 100.4 F (38 C) OR HIGHER *CHILLS OR SWEATING *NAUSEA AND VOMITING THAT IS NOT CONTROLLED WITH YOUR NAUSEA MEDICATION *UNUSUAL SHORTNESS OF BREATH *UNUSUAL BRUISING OR BLEEDING *URINARY PROBLEMS (pain or burning when urinating, or frequent urination) *BOWEL PROBLEMS (unusual diarrhea, constipation, pain near the anus) TENDERNESS IN MOUTH AND THROAT WITH OR WITHOUT PRESENCE OF ULCERS (sore throat, sores in mouth, or a toothache) UNUSUAL RASH, SWELLING OR PAIN  UNUSUAL VAGINAL DISCHARGE OR ITCHING   Items with * indicate a potential emergency and should be followed up as soon as possible or go to the Emergency Department if any problems should occur.  Please show the CHEMOTHERAPY ALERT CARD or IMMUNOTHERAPY ALERT CARD at check-in to the  Emergency Department and triage nurse.  Should you have questions after your visit or need to cancel or reschedule your appointment, please contact [facility]  Dept: [phone]  and follow the prompts.  Office hours are 8:00 a.m. to 4:30 p.m. Monday - Friday. Please note that voicemails left after 4:00 p.m. may not be returned until the following business day.  We are closed weekends and major holidays. You have access to a nurse at all times for urgent questions. Please call the main number to the clinic Dept: [phone] and follow the prompts.   For any non-urgent questions, you may also contact your provider using MyChart. We now offer e-Visits for anyone 61 and older to request care online for non-urgent symptoms. For details visit [email or web address].   Also download the MyChart app! Go to the app store, search "MyChart", open the app, select , and log in with your MyChart username and password.  Masks are optional in the cancer centers. If you would like for your care team to wear a mask while they are taking care of you, please let them know. For doctor visits, patients may have with them one support person who is at least 61 years old. At this time, visitors are not allowed in the infusion area.  Pertuzumab; Trastuzumab; Hyaluronidase Injection What is this medication? PERTUZUMAB (per TOOZ ue mab); TRASTUZUMAB (tras TOO zoo mab); HYALURONIDASE (hye al ur ON i dase) is used to treat breast cancer. Pertuzumab and trastuzumab are a monoclonal antibodies. Hyaluronidase is used to improve the effects of pertuzumab and trastuzumab.  This medicine may be used for other purposes; ask your health care provider or pharmacist if you have questions. COMMON BRAND NAME(S): PHESGO What should I tell my care team before I take this medication? They need to know if you have any of these conditions: heart disease high blood pressure history of irregular  heartbeat lung or breathing disease, like asthma an unusual or allergic reaction to pertuzumab, trastuzumab, hyaluronidase, other medicines, foods, dyes, or preservatives pregnant or trying to get pregnant breast-feeding How should I use this medication? This medicine is for injection under the skin. It may be given by a health care professional in a hospital or clinic setting or a health care professional may give you this medicine at home. Talk to your pediatrician regarding the use of this medicine in children. Special care may be needed. Overdosage: If you think you have taken too much of this medicine contact a poison control center or emergency room at once. NOTE: This medicine is only for you. Do not share this medicine with others. What if I miss a dose? It is important not to miss your dose. Call your doctor or health care professional if you are unable to keep an appointment. What may interact with this medication? This medicine may interact with the following medications: certain types of chemotherapy, such as daunorubicin, doxorubicin, epirubicin, and idarubicin This list may not describe all possible interactions. Give your health care provider a list of all the medicines, herbs, non-prescription drugs, or dietary supplements you use. Also tell them if you smoke, drink alcohol, or use illegal drugs. Some items may interact with your medicine. What should I watch for while using this medication? Visit your health care professional for regular checks on your progress. Tell your health care professional if your symptoms do not start to get better or if they get worse. Your condition will be monitored carefully while you are receiving this medicine. Do not become pregnant while taking this medicine or for 7 months after stopping it. Women should inform their doctor if they wish to become pregnant or think they might be pregnant. There is a potential for serious side effects to an unborn  child. Talk to your health care professional or pharmacist for more information. What side effects may I notice from receiving this medication? Side effects that you should report to your doctor or health care professional as soon as possible: allergic reactions like skin rash, itching or hives, swelling of the face, lips, or tongue breathing problems chest pain cough dizziness feeling faint; lightheaded, falls fever or chills loss of consciousness pain, tingling, numbness in the hands or feet palpitations sudden weight gain swelling of the ankles or legs unusually weak or tired Side effects that usually do not require medical attention (report these to your doctor or health care professional if they continue or are bothersome): diarrhea hair loss joint pain muscle pain nausea, vomiting pain, redness, or irritation at site where injected tiredness This list may not describe all possible side effects. Call your doctor for medical advice about side effects. You may report side effects to FDA at 1-800-FDA-1088. Where should I keep my medication? Keep out of the reach of children. If you are using this medicine at home, you will be instructed on how to store it. Throw away any unused medicine after the expiration date on the label. NOTE: This sheet is a summary. It may not cover all possible information. If you have questions about this medicine, talk to your doctor,  pharmacist, or health care provider.  2023 Elsevier/Gold Standard (2021-03-11 00:00:00)

## 2021-10-13 ENCOUNTER — Ambulatory Visit (HOSPITAL_COMMUNITY)
Admission: RE | Admit: 2021-10-13 | Discharge: 2021-10-13 | Disposition: A | Discharge status: Home / Self Care | Payer: Self-pay | Source: Ambulatory Visit | Attending: Internal Medicine | Admitting: Internal Medicine

## 2021-10-13 ENCOUNTER — Ambulatory Visit (HOSPITAL_BASED_OUTPATIENT_CLINIC_OR_DEPARTMENT_OTHER)
Admission: RE | Admit: 2021-10-13 | Discharge: 2021-10-13 | Disposition: A | Discharge status: Home / Self Care | Payer: Self-pay | Source: Ambulatory Visit | Attending: Internal Medicine | Admitting: Internal Medicine

## 2021-10-13 VITALS — BP 131/78 | HR 74 | Wt 202.0 lb

## 2021-10-13 DIAGNOSIS — I5022 Chronic systolic (congestive) heart failure: Secondary | ICD-10-CM | POA: Insufficient documentation

## 2021-10-13 DIAGNOSIS — I427 Cardiomyopathy due to drug and external agent: Secondary | ICD-10-CM | POA: Insufficient documentation

## 2021-10-13 DIAGNOSIS — I509 Heart failure, unspecified: Secondary | ICD-10-CM

## 2021-10-13 DIAGNOSIS — I10 Essential (primary) hypertension: Secondary | ICD-10-CM | POA: Insufficient documentation

## 2021-10-13 DIAGNOSIS — T451X5A Adverse effect of antineoplastic and immunosuppressive drugs, initial encounter: Secondary | ICD-10-CM

## 2021-10-13 DIAGNOSIS — I082 Rheumatic disorders of both aortic and tricuspid valves: Secondary | ICD-10-CM | POA: Insufficient documentation

## 2021-10-13 DIAGNOSIS — C50911 Malignant neoplasm of unspecified site of right female breast: Secondary | ICD-10-CM | POA: Insufficient documentation

## 2021-10-13 DIAGNOSIS — T451X5D Adverse effect of antineoplastic and immunosuppressive drugs, subsequent encounter: Secondary | ICD-10-CM | POA: Insufficient documentation

## 2021-10-13 DIAGNOSIS — C7951 Secondary malignant neoplasm of bone: Secondary | ICD-10-CM | POA: Insufficient documentation

## 2021-10-13 DIAGNOSIS — C50919 Malignant neoplasm of unspecified site of unspecified female breast: Secondary | ICD-10-CM

## 2021-10-13 DIAGNOSIS — Z79811 Long term (current) use of aromatase inhibitors: Secondary | ICD-10-CM | POA: Insufficient documentation

## 2021-10-13 DIAGNOSIS — C787 Secondary malignant neoplasm of liver and intrahepatic bile duct: Secondary | ICD-10-CM | POA: Insufficient documentation

## 2021-10-13 DIAGNOSIS — I5030 Unspecified diastolic (congestive) heart failure: Secondary | ICD-10-CM

## 2021-10-13 DIAGNOSIS — Z79899 Other long term (current) drug therapy: Secondary | ICD-10-CM | POA: Insufficient documentation

## 2021-10-13 NOTE — Progress Notes (Signed)
CARDIO-ONCOLOGY NOTE  Referring Physician: Dr. Burr Medico Primary Care: None HF Cardiologist: Dr. Haroldine Laws  HPI:  Carolyn Stewart is 61 y.o. female with HTN and stage IV right breast cancer referred by Dr. Burr Medico for enrollment into the Cardio-Oncology program due to reduced EF on echo.   Diagnosed with R breast cancer in 8/21 (triple positive) found to have mets to bone. She received THP 01/03/20-05/27/20. Her restaging PET scan from 04/28/20 showed no hypermetabolic lesions. Given her excellent response, she was switched to maintenance therapy with Herceptin, Perjeta q3weeks and Letrozole starting 06/18/20 which is still ongoing.  Zometa started 06/18/20  In June had echo which showed EF 50-55% so H/P held. Losartan started.   Had covid in July. Received Paxlovid and recovered without need for hospital admission.  Echo 07/22 with EF 55-60%. Herceptin restarted.  Echo 9/22 EF 55% felt to be slightly down from before but still in normal range  Echo 03/09/21 EF 55%  She is here for f/u. Continue on Herceptin  Doing great. Active. No SOB or edema.   - Echo today 10/13/21 EF 55-60% GLS -22.4%  Echo 06/16/21; EF 55-60% GLS -20%  ECHO 07/22: EF 55-60% GLS 16 ECHO  09/22/20: EF 40-45%  (I felt 50-55%) ECHO 12/21: EF 60-65% GLS -18.2 ECHO 8/21: EF 60-65% G1DD   Review of Systems: Negative except as mentioned in HPI  Past Medical History:  Diagnosis Date   Cancer ([place]) 2021   Hypertension     Current Outpatient Medications  Medication Sig Dispense Refill   acetaminophen (TYLENOL) 500 MG tablet Take 1,000 mg by mouth every 6 (six) hours as needed for moderate pain or headache.     amLODipine (NORVASC) 10 MG tablet Take 2 tablets (20 mg total) by mouth daily. 60 tablet 11   Cholecalciferol (VITAMIN D3) 50 MCG (2000 UT) TABS Take 2,000 Units by mouth daily.      gabapentin (NEURONTIN) 100 MG capsule Take 1-3 capsules (100-300 mg total) by mouth at bedtime. 60 capsule 2   letrozole (FEMARA) 2.5 MG  tablet Take 1 tablet (2.5 mg total) by mouth daily. 90 tablet 1   lidocaine-prilocaine (EMLA) cream Apply 1 application topically as needed. 30 g 1   losartan (COZAAR) 100 MG tablet Take 1 tablet (100 mg total) by mouth daily. 30 tablet 6   metoprolol succinate (TOPROL-XL) 100 MG 24 hr tablet Take 1 tablet (100 mg total) by mouth daily. 30 tablet 6   omeprazole (PRILOSEC OTC) 20 MG tablet Take 20 mg by mouth daily.     potassium chloride (KLOR-CON M) 10 MEQ tablet Take 1 tablet (10 mEq total) by mouth daily. 30 tablet 1   No current facility-administered medications for this encounter.    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   Tobacco Use   Smoking status: Never   Smokeless tobacco: Never  Vaping Use   Vaping Use: Never used  Substance and Sexual Activity   Alcohol use: Yes    Comment: social drinker    Drug use: No   Sexual activity: Yes  Other Topics Concern   Not on file  Social History Narrative   Not on file   Social Determinants of  Health   Financial Resource Strain: Low Risk  (12/03/2019)   Overall Financial Resource Strain (CARDIA)    Difficulty of Paying Living Expenses: Not hard at all  Food Insecurity: No Food Insecurity (12/03/2019)   Hunger Vital Sign    Worried About Running Out of Food in the Last Year: Never true    Ran Out of Food in the Last Year: Never true  Transportation Needs: No Transportation Needs (12/03/2019)   PRAPARE - Hydrologist (Medical): No    Lack of Transportation (Non-Medical): No  Physical Activity: Not on file  Stress: Not on file  Social Connections: Not on file  Intimate Partner Violence: Not on file      Family History  Problem Relation Age of Onset   Hypertension  Mother    Hypertension Father     Vitals:   10/13/21 1151  BP: 131/78  Pulse: 74  SpO2: 98%  Weight: 91.6 kg (202 lb)     General:  Well appearing. No resp difficulty HEENT: normal Neck: supple. no JVD. Carotids 2+ bilat; no bruits. No lymphadenopathy or thryomegaly appreciated. Cor: PMI nondisplaced. Regular rate & rhythm. No rubs, gallops or murmurs. Lungs: clear Abdomen: soft, nontender, nondistended. No hepatosplenomegaly. No bruits or masses. Good bowel sounds. Extremities: no cyanosis, clubbing, rash, edema Neuro: alert & orientedx3, cranial nerves grossly intact. moves all 4 extremities w/o difficulty. Affect pleasant    ASSESSMENT & PLAN: 1.  Chemotherapy-induced CM - ECHO 8/21: EF 60-65% G1DD - ECHO 12/21: EF 60-65% GLS -18.2 - ECHO  09/22/20: EF 40-45%  (I felt 50-55%) - Herceptin held in June 2022 d/t mild cardiomyopathy. Restarted in August 2022 - ECHO 07/22: EF 55-60% GLS 16 - ECHO 9/22 F 55%. - Echo 03/09/21 EF 55% (completely stable) - Echo 06/16/21; EF 55-60% GLS -20% - Echo today 10/13/21 EF 55-60% GLS -22.4% - Continue losartan 100 mg daily and Toprol 100 daily - Continue Herceptin. Continue echos every 4-6 months  2. Stage IV Right Breast Cancer (triple positive) - Diagnosed with R breast cancer in 8/21 (triple positive) found to have mets to bone. She received THP 01/03/20-05/27/20. Her restaging PET scan from 04/28/20 showed no hypermetabolic lesions. Given her excellent response, she was switched to maintenance therapy with Herceptin, Perjeta q3weeks and Letrozole starting 06/18/20.  Zometa started 06/18/20, will continue every 3 months. - Herceptin held 09/22/20 due to reduced EF, restarted in August as above.  - Tolerating Herceptin well. EF stable. Ok to continue   3. HTN - Blood pressure well controlled. Continue current regimen.   Glori Bickers, MD  12:16 PM

## 2021-10-13 NOTE — Patient Instructions (Signed)
Good to see you today  Your physician has requested that you have an echocardiogram. Echocardiography is a painless test that uses sound waves to create images of your heart. It provides your doctor with information about the size and shape of your heart and how well your heart's chambers and valves are working. This procedure takes approximately one hour. There are no restrictions for this procedure.  Your physician recommends that you schedule a follow-up appointment in: October 5 @ 66 for echo followed by Appointment with DR.Bensimhon '@12'$ ;20  If you have any questions or concerns before your next appointment please send [place] a message through [place] or call our office at [phone].    TO LEAVE A MESSAGE FOR THE NURSE SELECT OPTION 2, PLEASE LEAVE A MESSAGE INCLUDING: YOUR NAME DATE OF BIRTH CALL BACK NUMBER REASON FOR CALL**this is important as we prioritize the call backs  YOU WILL RECEIVE A CALL BACK THE SAME DAY AS LONG AS YOU CALL BEFORE 4:00 PM  At the [facility], you and your health needs are our priority. As part of our continuing mission to provide you with exceptional heart care, we have created designated Provider Care Teams. These Care Teams include your primary Cardiologist (physician) and Advanced Practice Providers (APPs- Physician Assistants and Nurse Practitioners) who all work together to provide you with the care you need, when you need it.   You may see any of the following providers on your designated Care Team at your next follow up: Dr Glori Bickers Dr Haynes Kerns, NP Lyda Jester, [place] [facility] [place], [place] Audry Riles, PharmD   Please be sure to bring in all your medications bottles to every appointment.

## 2021-10-16 LAB — ECHOCARDIOGRAM COMPLETE
Area-P 1/2: 3.42 cm2
P 1/2 time: 403 msec
S' Lateral: 2 cm

## 2021-10-28 ENCOUNTER — Inpatient Hospital Stay: Payer: Self-pay | Attending: Hematology

## 2021-10-28 ENCOUNTER — Inpatient Hospital Stay: Payer: Self-pay

## 2021-10-28 ENCOUNTER — Other Ambulatory Visit: Payer: Self-pay

## 2021-10-28 ENCOUNTER — Other Ambulatory Visit: Payer: Self-pay | Admitting: Hematology

## 2021-10-28 ENCOUNTER — Inpatient Hospital Stay (HOSPITAL_BASED_OUTPATIENT_CLINIC_OR_DEPARTMENT_OTHER): Payer: Self-pay | Admitting: Hematology

## 2021-10-28 ENCOUNTER — Encounter: Payer: Self-pay | Admitting: Hematology

## 2021-10-28 VITALS — BP 127/71 | HR 78 | Temp 97.7°F | Resp 18 | Ht 67.0 in | Wt 200.2 lb

## 2021-10-28 DIAGNOSIS — Z5111 Encounter for antineoplastic chemotherapy: Secondary | ICD-10-CM | POA: Insufficient documentation

## 2021-10-28 DIAGNOSIS — C50411 Malignant neoplasm of upper-outer quadrant of right female breast: Secondary | ICD-10-CM

## 2021-10-28 DIAGNOSIS — C787 Secondary malignant neoplasm of liver and intrahepatic bile duct: Secondary | ICD-10-CM | POA: Insufficient documentation

## 2021-10-28 DIAGNOSIS — I10 Essential (primary) hypertension: Secondary | ICD-10-CM | POA: Insufficient documentation

## 2021-10-28 DIAGNOSIS — G620 Drug-induced polyneuropathy: Secondary | ICD-10-CM | POA: Insufficient documentation

## 2021-10-28 DIAGNOSIS — Z170 Estrogen receptor positive status [ER+]: Secondary | ICD-10-CM

## 2021-10-28 DIAGNOSIS — C7951 Secondary malignant neoplasm of bone: Secondary | ICD-10-CM | POA: Insufficient documentation

## 2021-10-28 LAB — CBC WITH DIFFERENTIAL (CANCER CENTER ONLY)
Abs Immature Granulocytes: 0.01 10*3/uL (ref 0.00–0.07)
Basophils Absolute: 0 10*3/uL (ref 0.0–0.1)
Basophils Relative: 0 %
Eosinophils Absolute: 0.2 10*3/uL (ref 0.0–0.5)
Eosinophils Relative: 3 %
HCT: 37.5 % (ref 36.0–46.0)
Hemoglobin: 12.6 g/dL (ref 12.0–15.0)
Immature Granulocytes: 0 %
Lymphocytes Relative: 41 %
Lymphs Abs: 2.7 10*3/uL (ref 0.7–4.0)
MCH: 29.9 pg (ref 26.0–34.0)
MCHC: 33.6 g/dL (ref 30.0–36.0)
MCV: 88.9 fL (ref 80.0–100.0)
Monocytes Absolute: 0.5 10*3/uL (ref 0.1–1.0)
Monocytes Relative: 8 %
Neutro Abs: 3 10*3/uL (ref 1.7–7.7)
Neutrophils Relative %: 48 %
Platelet Count: 275 10*3/uL (ref 150–400)
RBC: 4.22 MIL/uL (ref 3.87–5.11)
RDW: 13.7 % (ref 11.5–15.5)
WBC Count: 6.5 10*3/uL (ref 4.0–10.5)
nRBC: 0 % (ref 0.0–0.2)

## 2021-10-28 LAB — CMP (CANCER CENTER ONLY)
ALT: 8 U/L (ref 0–44)
AST: 14 U/L — ABNORMAL LOW (ref 15–41)
Albumin: 4.2 g/dL (ref 3.5–5.0)
Alkaline Phosphatase: 112 U/L (ref 38–126)
Anion gap: 5 (ref 5–15)
BUN: 12 mg/dL (ref 8–23)
CO2: 27 mmol/L (ref 22–32)
Calcium: 9 mg/dL (ref 8.9–10.3)
Chloride: 108 mmol/L (ref 98–111)
Creatinine: 0.75 mg/dL (ref 0.44–1.00)
GFR, Estimated: >60 mL/min (ref >60)
Glucose, Bld: 121 mg/dL — ABNORMAL HIGH (ref 70–99)
Potassium: 3.3 mmol/L — ABNORMAL LOW (ref 3.5–5.1)
Sodium: 140 mmol/L (ref 135–145)
Total Bilirubin: 0.4 mg/dL (ref 0.3–1.2)
Total Protein: 7.2 g/dL (ref 6.5–8.1)

## 2021-10-28 MED ORDER — PERTUZ-TRASTUZ-HYALURON-ZZXF 60-60-2000 MG-MG-U/ML CHEMO ~~LOC~~ SOLN
10.0000 mL | Freq: Once | SUBCUTANEOUS | Status: AC
  Administered 2021-10-28: 10 mL via SUBCUTANEOUS
  Filled 2021-10-28: qty 10

## 2021-10-28 MED ORDER — DIPHENHYDRAMINE HCL 25 MG PO CAPS
50.0000 mg | ORAL_CAPSULE | Freq: Once | ORAL | Status: AC
  Administered 2021-10-28: 50 mg via ORAL
  Filled 2021-10-28: qty 2

## 2021-10-28 MED ORDER — HEPARIN SOD (PORK) LOCK FLUSH 100 UNIT/ML IV SOLN
500.0000 [IU] | Freq: Once | INTRAVENOUS | Status: AC
  Administered 2021-10-28: 500 [IU]

## 2021-10-28 MED ORDER — SODIUM CHLORIDE 0.9% FLUSH
10.0000 mL | Freq: Once | INTRAVENOUS | Status: AC
  Administered 2021-10-28: 10 mL

## 2021-10-28 MED ORDER — ACETAMINOPHEN 325 MG PO TABS
650.0000 mg | ORAL_TABLET | Freq: Once | ORAL | Status: AC
  Administered 2021-10-28: 650 mg via ORAL
  Filled 2021-10-28: qty 2

## 2021-10-28 NOTE — Progress Notes (Signed)
Carolyn Stewart   [phone] [phone]   Clinic Follow up Note   Patient Care Team: Julian Hy, PA-C as PCP - General (Physician Assistant) [name], MD as Consulting Physician (General Surgery) Truitt Merle, MD as Consulting Physician (Hematology) Kyung Rudd, MD as Consulting Physician (Radiation Oncology) Mauro Kaufmann, RN as Oncology Nurse Navigator Rockwell Germany, RN as Oncology Nurse Navigator  Date of Service:  10/28/2021  CHIEF COMPLAINT: f/u of metastatic breast cancer  CURRENT THERAPY:  -Maintenance Herceptin and Perjeta (Phesgo) q3weeks starting 06/18/20, held 09/22/20-11/24/20 due to low EF -Letrozole 2.21m daily starting 06/18/20  ASSESSMENT & PLAN:  Carolyn Stewart a 61y.o. female with   1. Malignant neoplasm of upper-outer quadrant of right breast, ductal carcinoma, Stage IIA, c(T3N1Mx), with bone and possible liver mets,  ER+/PR+/HER2+, Grade II-III  -diagnosed in 11/2019 with large right breast mass measuring up to 11cm and right lymphadenopathy. 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 Left axillary node biopsy was benign.  -01/01/20 Liver biopsy was negative but 01/12/20 Bone Biopsy was positive for metastatic carcinoma from primary breast cancer. ER/PR/HER2 were all negative, possibly related to decalcification. -She received THP 01/03/20-05/27/20. Her restaging PET scan from 04/28/20 showed no hypermetabolic lesions. Given her excellent response, I switched to maintenance therapy with Herceptin, Perjeta (Phesgo) q3weeks and Letrozole starting 06/18/20.   -Restaging CT and bone scan on 09/03/20 showed interval treatment response, both to osseous metastases and liver lesions. Will continue current maintenance therapy.  -Echo in 09/2020 showed dropped EF 45-50%, treatment held temporarily. EF recovered well and remains stable, most recently 55-60% on 10/13/21. --labs reviewed, CBC and  CMP WNL, adequate for treatment. Physical exam showed no palpable breast masses. She continues to tolerate well with no side effects, will continue. -her last mammogram was in 10/2019. [facility]. -we will plan for restaging CT scan when she is hopefully covered in 02/2022.   2. Bone metastases from breast cancer -bone scan on 12/10/19 showed metastatic disease in left acetabulum, L3, and right 10th rib. -01/12/20 Bone Biopsy was positive for metastatic carcinoma from primary breast cancer. -PET on 04/28/20 after THP treatment showed no active disease. -Zometa started 06/18/20, but held after second dose due to loss of insurance.  I encouraged her to keep up with dental care. She has no active dental issues. She is on calcium and VitD -most recent bone scan on 09/03/20 showed continued improvement.   3. Social Support -she lost insurance in 11/2020. -she is receiving Phesgo through manufacturer.  -we are postponing her restaging scan until she has insurance again, hopefully in 02/2022. I am comfortable with this given her normal tumor marker and good clinical disposition.   4. Chemotherapy-induced cardiomyopathy, Neuropathy -echo 09/22/20 showed decreased EF. She was started on losartan by Dr. BHaroldine Lawson 10/21/20 and EF improved. -most recent echo on 10/13/21 was stable.  -she continues to have some mild neuropathy in her fingers and feet from prior chemo, only bothersome at night. I called in gabapentin for her 08/26/21.   5. HTN -Well controlled on Metoprolol and Amlodipine.    6. Covid-19 (+) 11/06/20 -She experienced cough, chest congestion, bad headaches, and night fevers. She was prescribed paxlovid and recovered well     PLAN:  -proceed with Phesgo injection today -continue letrozole -Phesgo every 3 weeks             -  she prefers [facility] for appointment on days she doesn't see me             -she prefers Friday  appointments -lab and f/u in 12 weeks -will postpone her restaging scan and zometa infusion until 02/2022  -will reach out to [facility] program to see if she can get free screening MM    No problem-specific Assessment & Plan notes found for this encounter.   SUMMARY OF ONCOLOGIC HISTORY: Oncology History Overview Note  Cancer Staging Malignant neoplasm of upper-outer quadrant of right breast in female, estrogen receptor positive ([place]) Staging form: Breast, AJCC 8th Edition - Clinical stage from 11/25/2019: Stage IIA (cT3, cN1, cM0, G2, ER+, PR+, HER2+) - Signed by Truitt Merle, MD on 12/03/2019    Malignant neoplasm of upper-outer quadrant of right breast in female, estrogen receptor positive ([place])  11/13/2019 Mammogram    Diagnostic Mammogram 11/13/19  IMPRESSION: -Highly suspicious mass and calcifications in the right breast centered between 9 and 12 o'clock spanning up to 11 cm.  -Multiple masses in the right axilla. One of the masses contains calcifications, denoted as mass number 2 measuring 9 x 11 by 10 mm. Several of these masses do not appear to represent definitive lymph nodes. Others may be small but abnormal appearing lymph nodes. Taken in total, a cluster of masses in the right axilla spans up to 3.7 cm.  -There are fibrocystic changes on the left. There are also some solid-appearing masses. A solid appearing mass at 10:30, 6 cm from the nipple measures 9 x 6 x 6 mm. An adjacent solid mass at 10 o'clock, 6 cm from the nipple measures 8 x 6 by 5 mm. Another solid-appearing mass at 11 o'clock, 1 cm from the nipple measures 5 x 4 by 5 mm. Fibrocystic changes are seen at 10 o'clock, 12 o'clock, and 4 o'clock. Another solid-appearing mass seen at 5 o'clock, 4 cm from the nipple measuring 6 x 3 x 4 mm. There is a single abnormal node in the left axilla with a cortex measuring 5 mm and restriction of the fatty hilum.   11/25/2019 Initial Biopsy   Diagnosis 1. Breast, right, needle core  biopsy, upper outer - INVASIVE MAMMARY CARCINOMA - MAMMARY CARCINOMA IN SITU WITH CALCIFICATIONS AND NECROSIS - SEE COMMENT 2. Lymph node, needle/core biopsy, right axilla - INVASIVE MAMMARY CARCINOMA - SEE COMMENT 3. Lymph node, needle/core biopsy, left axilla - BENIGN LYMPH NODE - NO CARCINOMA IDENTIFIED Microscopic Comment 1. The biopsy material shows an infiltrative proliferation of cells arranged linearly and in small clusters. Based on the biopsy, the carcinoma appears Nottingham grade 2 of 3 and measures 1.2 cm in greatest linear extent. E-cadherin and prognostic markers (ER/PR/ki-67/HER2)are pending and will be reported in an addendum. Dr. Tresa Moore reviewed the case and agrees with the above diagnosis. These results were called to [facility] on November 26, 2019. 2. Definitive nodal tissue is not identified. This biopsy has a slightly different architecture morphology than what is seen in part 1. The carcinoma measures 0.7 cm in greatest dimension. E-cadherin and prognostic markers (ER, PR, Ki-67 and HER-2) are pending and will be reported in an addendum.   11/25/2019 Receptors her2   1. PROGNOSTIC INDICATORS Results: IMMUNOHISTOCHEMICAL AND MORPHOMETRIC ANALYSIS PERFORMED MANUALLY The tumor cells are POSITIVE for Her2 (3+). Estrogen Receptor: 90%, POSITIVE, STRONG STAINING INTENSITY Progesterone Receptor: 20%, POSITIVE, STRONG STAINING INTENSITY Proliferation Marker Ki67: 30%  2. PROGNOSTIC INDICATORS Results: IMMUNOHISTOCHEMICAL AND MORPHOMETRIC ANALYSIS PERFORMED MANUALLY  The tumor cells are POSITIVE for Her2 (3+). Estrogen Receptor: 90%, POSITIVE, STRONG STAINING INTENSITY Progesterone Receptor: 25%, POSITIVE, STRONG STAINING INTENSITY Proliferation Marker Ki67: 30%   11/25/2019 Cancer Staging   Staging form: Breast, AJCC 8th Edition - Clinical stage from 11/25/2019: Stage IIA (cT3, cN1, cM0, G2, ER+, PR+, HER2+) - Signed by Truitt Merle, MD on 12/03/2019    12/01/2019 Initial Diagnosis   Malignant neoplasm of upper-outer quadrant of right breast in female, estrogen receptor positive ([place])   12/10/2019 Imaging   Bone Scan  IMPRESSION: Findings concerning for bony metastatic disease in the left acetabulum region, L3 vertebral body region, and anterolateral right tenth rib.   Increased uptake in several large joints likely is of arthropathic etiology.   12/11/2019 Imaging   CT CAP w contrast  IMPRESSION: Prominent glandular tissue in the central right breast with nipple retraction, likely corresponding to the patient's known right breast neoplasm.   Prominent right axillary nodes measuring up to 8 mm short axis, suspicious for nodal metastases in this clinical context.   3 mm subpleural pulmonary nodule in the right lower lobe, likely benign. However, follow-up CT chest is suggested in 3-6 months.   Multiple hepatic lesions, including a dominant 2.4 cm lesion in segment 6, which is considered indeterminate. Metastasis is not excluded. Consider MRI abdomen with/without contrast for further characterization.   11 mm sclerotic lesion along the left posterior aspect of the T7 vertebral body raises concern for isolated osseous metastasis. Correlate with priors if available. Benign vertebral hemangioma at T11.   12/12/2019 Breast MRI   IMPRESSION: 1. The biopsy proven malignancy in the right breast involves all 4 quadrants and spans up to 9 cm by MRI. There is enhancement within the skin at the level of the nipple.   2. Multiple matted masses are seen in the right axilla, either abnormal metastatic lymph nodes or a combination of masses and metastatic lymph nodes. One of these has been previously biopsied showing invasive mammary carcinoma without definitive nodal tissue.   3.  There is a 1.5 cm Rotter's lymph node on the right.   4. There is markedly abnormal enhancement and edema in the right pectoralis major muscle spanning 6-7  cm, suspicious for metastatic disease.   5. There is diffuse nodular enhancement throughout the left breast, which may correspond with the solid-appearing masses seen on ultrasound. These all have a similar appearance on MRI.   6.  No findings of left axillary lymphadenopathy.   12/15/2019 Procedure   PAC placed    12/24/2019 Imaging   MRI abdomen  IMPRESSION: 1. Multifocal enhancing bone lesions are noted within the lumbar spine, and bony pelvis compatible with metastatic disease. 2. Four, small peripherally enhancing cystic lesions are noted within the liver worrisome for metastatic disease. 3. 2 enhancing liver lesions are also noted with signal and enhancement characteristics consistent with benign liver hemangioma. 4. Well-circumscribed, enhancing T2 and T1 hypointense structure within the right adnexal region is indeterminate. It is unclear whether not this is arising from the right ovary, broad ligament or right side of uterine fundus. Differential considerations include fibrothecoma, versus pedunculated uterine leiomyoma. Metastatic disease is considered less favored. Attention on follow-up imaging is recommended.     01/03/2020 - 05/27/2020 Chemotherapy   First line THP q3weeks starting 01/03/20-05/27/20   01/05/2020 Imaging   US Abdomen  IMPRESSION: 1. Suspect hematoma in the medial right lobe of the liver in the area of previous biopsy. Note that recent MR does not  show lesion in this area. This area has ill-defined margins and measures 3.9 x 4.0 x 3.5 cm. No perihepatic fluid.   2. Probable hemangiomas elsewhere in the right lobe of the liver. Note that several smaller lesions seen on recent MR are not appreciable by ultrasound.   3.  Study otherwise unremarkable.   01/12/2020 Pathology Results   FINAL MICROSCOPIC DIAGNOSIS:   A. BONE, SCLEROTIC LESION, LEFT ANTERIOR ILIAC SPINE, BIOPSY:  - Metastatic carcinoma, consistent with patient's clinical history of   primary breast carcinoma  - See comment   COMMENT:  Immunohistochemical stains show that the tumor cells are positive for  CK7 and GATA3; and negative for CK20, consistent with above  interpretation.   PROGNOSTIC INDICATOR RESULTS:  The tumor cells are NEGATIVE for Her2 (0); see comment  Estrogen Receptor: NEGATIVE; see comment  Progesterone Receptor: NEGATIVE; see comment    03/23/2020 Imaging   CT CAP IMPRESSION: 1. Right lower lobe perifissural nodule is slightly more conspicuous on today's study. Close attention on follow-up recommended. 2. Interval progression of segment IV liver lesion, concerning for metastatic progression. The remaining visualized liver lesions are stable to smaller in the interval. 3. Interval evolution in appearance of thoracolumbar spinal lesions and left iliac crest lesion, potentially reflecting response to therapy/healing. No definite new osseous lesions on today's study. 4. New small fluid collection right cul-de-sac. 5. Probable fibroid change in the uterus including exophytic right fundal lesion. 6. Aortic Atherosclerosis (ICD10-I70.0).   03/31/2020 Imaging   MR ABD IMPRESSION: 1. Enlarging hepatic lesion in hepatic subsegment IV suspicious for hepatic metastasis. The it is uncertain whether the change in the short interval is due to technical factors with different modalities or true enlargement. 2. Lesion in the posterior RIGHT hepatic lobe with imaging features that are atypical for hemangioma but stability and signs of enhancement on later phase raising this question. Another more classic appearing may angioma seen inferior to this location in hepatic subsegment VI. Attention on follow-up. 3. Heterogeneous enhancement throughout the spine particularly at L3 as exhibited on the prior study with very patchy marrow signal remains suspicious for bony metastatic lesions in this patient with known bone metastases.   04/01/2020 Imaging    Bone scan IMPRESSION: Good response to therapy with resolution of sites of uptake seen previously at the cervical spine and posterior RIGHT tenth rib as well as a diminished degree of uptake at remaining previously identified osseous metastatic foci.   04/28/2020 PET scan   IMPRESSION: 1. No hypermetabolic lesions to suggest active metastatic disease involving the liver or osseous structures. 2. Stable small low-attenuation liver lesions, likely treated disease.   06/18/2020 -  Chemotherapy   Given her excellent response, I switched to maintenance therapy with Herceptin, Perjeta (Phesgo) q3weeks and Letrozole.   06/18/2020 -  Chemotherapy   Zometa starting 06/18/20   06/18/2020 -  Anti-estrogen oral therapy   Letrozole 2.57m once dialy.    09/03/2020 Imaging   Bone Scan IMPRESSION: 1. Significant interval improvement compared to previous studies. Only mild uptake remains in the pelvis as above. There has been significant interval improvement over time.  CT C/A/P IMPRESSION: 1. Hypodense liver lesions are slightly decreased in size compared to prior examination, consistent with treatment response of hepatic metastatic disease. 2. Unchanged sclerotic lesion of the anterior left iliac crest. Unchanged lytic superior endplate deformity of L3, which remains equivocal for metastatic lesion versus mechanical Schmorl deformity. No CT evidence of new osseous metastatic disease. 3. No evidence of  new metastatic disease in the chest, abdomen, or pelvis. 4. Coronary artery disease.      INTERVAL HISTORY:  Carolyn Stewart is here for a follow up of metastatic breast cancer. She was last seen by me on 08/26/21. She presents to the clinic alone. She reports she is doing well overall. She tells me her insurance situation is frustrating-- her husband's employer has covered the employees but has been dragging on coverage for their families. She notes today that they have worked out everything in  anticipation of coverage starting in November, which is still another 4 months from now.   All other systems were reviewed with the patient and are negative.  MEDICAL HISTORY:  Past Medical History:  Diagnosis Date   Cancer ([place]) 2021   Hypertension     SURGICAL HISTORY: Past Surgical History:  Procedure Laterality Date   left parotid tumor removal   2006   PORTACATH PLACEMENT Left 12/15/2019   Procedure: INSERTION PORT-A-CATH WITH ULTRASOUND GUIDANCE;  Surgeon: [name], MD;  Location: [facility];  Service: General;  Laterality: Left;   TONSILLECTOMY      I have reviewed the social history and family history with the patient and they are unchanged from previous note.  ALLERGIES:  has No Known Allergies.  MEDICATIONS:  Current Outpatient Medications  Medication Sig Dispense Refill   acetaminophen (TYLENOL) 500 MG tablet Take 1,000 mg by mouth every 6 (six) hours as needed for moderate pain or headache.     amLODipine (NORVASC) 10 MG tablet Take 2 tablets (20 mg total) by mouth daily. 60 tablet 11   Cholecalciferol (VITAMIN D3) 50 MCG (2000 UT) TABS Take 2,000 Units by mouth daily.      gabapentin (NEURONTIN) 100 MG capsule Take 1-3 capsules (100-300 mg total) by mouth at bedtime. 60 capsule 2   letrozole (FEMARA) 2.5 MG tablet Take 1 tablet (2.5 mg total) by mouth daily. 90 tablet 1   lidocaine-prilocaine (EMLA) cream Apply 1 application topically as needed. 30 g 1   losartan (COZAAR) 100 MG tablet Take 1 tablet (100 mg total) by mouth daily. 30 tablet 6   metoprolol succinate (TOPROL-XL) 100 MG 24 hr tablet Take 1 tablet (100 mg total) by mouth daily. 30 tablet 6   omeprazole (PRILOSEC OTC) 20 MG tablet Take 20 mg by mouth daily.     potassium chloride (KLOR-CON M) 10 MEQ tablet Take 1 tablet (10 mEq total) by mouth daily. 30 tablet 1   No current facility-administered medications for this visit.    PHYSICAL EXAMINATION: ECOG PERFORMANCE STATUS: 0 - Asymptomatic  Vitals:    10/28/21 0850  BP: 127/71  Pulse: 78  Resp: 18  Temp: 97.7 F (36.5 C)  SpO2: 99%   Wt Readings from Last 3 Encounters:  10/28/21 200 lb 3.2 oz (90.8 kg)  10/13/21 202 lb (91.6 kg)  10/07/21 200 lb 6.4 oz (90.9 kg)     GENERAL:alert, no distress and comfortable SKIN: skin color, texture, turgor are normal, no rashes or significant lesions EYES: normal, Conjunctiva are pink and non-injected, sclera clear  NECK: supple, thyroid normal size, non-tender, without nodularity LYMPH:  no palpable lymphadenopathy in the cervical, axillary LUNGS: clear to auscultation and percussion with normal breathing effort HEART: regular rate & rhythm and no murmurs and no lower extremity edema ABDOMEN:abdomen soft, non-tender and normal bowel sounds Musculoskeletal:no cyanosis of digits and no clubbing  NEURO: alert & oriented x 3 with fluent speech, no focal motor/sensory deficits BREAST: No  palpable mass, nodules or adenopathy bilaterally. Breast exam benign.   LABORATORY DATA:  I have reviewed the data as listed    Latest Ref Rng & Units 10/28/2021    8:33 AM 08/26/2021    8:11 AM 06/24/2021    9:35 AM  CBC  WBC 4.0 - 10.5 K/uL 6.5  6.5  6.1   Hemoglobin 12.0 - 15.0 g/dL 12.6  13.1  12.9   Hematocrit 36.0 - 46.0 % 37.5  40.1  39.4   Platelets 150 - 400 K/uL 275  276  277         Latest Ref Rng & Units 10/28/2021    8:33 AM 08/26/2021    8:11 AM 06/24/2021    9:35 AM  CMP  Glucose 70 - 99 mg/dL 121  135  117   BUN 8 - 23 mg/dL '12  14  10   ' Creatinine 0.44 - 1.00 mg/dL 0.75  0.83  0.88   Sodium 135 - 145 mmol/L 140  142  142   Potassium 3.5 - 5.1 mmol/L 3.3  3.4  3.4   Chloride 98 - 111 mmol/L 108  109  109   CO2 22 - 32 mmol/L '27  27  28   ' Calcium 8.9 - 10.3 mg/dL 9.0  9.3  9.5   Total Protein 6.5 - 8.1 g/dL 7.2  7.6  7.5   Total Bilirubin 0.3 - 1.2 mg/dL 0.4  0.5  0.5   Alkaline Phos 38 - 126 U/L 112  109  102   AST 15 - 41 U/L '14  14  13   ' ALT 0 - 44 U/L '8  10  7        ' RADIOGRAPHIC STUDIES: I have personally reviewed the radiological images as listed and agreed with the findings in the report. No results found.    No orders of the defined types were placed in this encounter.  All questions were answered. The patient knows to call the clinic with any problems, questions or concerns. No barriers to learning was detected. The total time spent in the appointment was 30 minutes.     Truitt Merle, MD 10/28/2021   I, Wilburn Mylar, am acting as scribe for Truitt Merle, MD.   I have reviewed the above documentation for accuracy and completeness, and I agree with the above.

## 2021-10-28 NOTE — Patient Instructions (Signed)
[facility]   Discharge Instructions: Thank you for choosing [facility] to provide your oncology and hematology care.   If you have a lab appointment with the [facility], please go directly to the [facility] and check in at the registration area.   Wear comfortable clothing and clothing appropriate for easy access to any Portacath or PICC line.   We strive to give you quality time with your provider. You may need to reschedule your appointment if you arrive late (15 or more minutes).  Arriving late affects you and other patients whose appointments are after yours.  Also, if you miss three or more appointments without notifying the office, you may be dismissed from the clinic at the provider's discretion.      For prescription refill requests, have your pharmacy contact our office and allow 72 hours for refills to be completed.    Today you received the following chemotherapy and/or immunotherapy agents Phesgo.      To help prevent nausea and vomiting after your treatment, we encourage you to take your nausea medication as directed.  BELOW ARE SYMPTOMS THAT SHOULD BE REPORTED IMMEDIATELY: *FEVER GREATER THAN 100.4 F (38 C) OR HIGHER *CHILLS OR SWEATING *NAUSEA AND VOMITING THAT IS NOT CONTROLLED WITH YOUR NAUSEA MEDICATION *UNUSUAL SHORTNESS OF BREATH *UNUSUAL BRUISING OR BLEEDING *URINARY PROBLEMS (pain or burning when urinating, or frequent urination) *BOWEL PROBLEMS (unusual diarrhea, constipation, pain near the anus) TENDERNESS IN MOUTH AND THROAT WITH OR WITHOUT PRESENCE OF ULCERS (sore throat, sores in mouth, or a toothache) UNUSUAL RASH, SWELLING OR PAIN  UNUSUAL VAGINAL DISCHARGE OR ITCHING   Items with * indicate a potential emergency and should be followed up as soon as possible or go to the Emergency Department if any problems should occur.  Please show the CHEMOTHERAPY ALERT CARD or IMMUNOTHERAPY ALERT CARD at check-in to  the Emergency Department and triage nurse.  Should you have questions after your visit or need to cancel or reschedule your appointment, please contact [facility]  Dept: [phone]  and follow the prompts.  Office hours are 8:00 a.m. to 4:30 p.m. Monday - Friday. Please note that voicemails left after 4:00 p.m. may not be returned until the following business day.  We are closed weekends and major holidays. You have access to a nurse at all times for urgent questions. Please call the main number to the clinic Dept: [phone] and follow the prompts.   For any non-urgent questions, you may also contact your provider using MyChart. We now offer e-Visits for anyone 54 and older to request care online for non-urgent symptoms. For details visit [email or web address].   Also download the MyChart app! Go to the app store, search "MyChart", open the app, select [facility], and log in with your MyChart username and password.  Masks are optional in the cancer centers. If you would like for your care team to wear a mask while they are taking care of you, please let them know. For doctor visits, patients may have with them one support person who is at least 61 years old. At this time, visitors are not allowed in the infusion area.  Pertuzumab; Trastuzumab; Hyaluronidase Injection What is this medication? PERTUZUMAB (per TOOZ ue mab); TRASTUZUMAB (tras TOO zoo mab); HYALURONIDASE (hye al ur ON i dase) is used to treat breast cancer. Pertuzumab and trastuzumab are a monoclonal antibodies. Hyaluronidase is used to improve the effects of pertuzumab and trastuzumab.  This medicine may be used for other purposes; ask your health care provider or pharmacist if you have questions. COMMON BRAND NAME(S): PHESGO What should I tell my care team before I take this medication? They need to know if you have any of these conditions: heart disease high blood pressure history of irregular  heartbeat lung or breathing disease, like asthma an unusual or allergic reaction to pertuzumab, trastuzumab, hyaluronidase, other medicines, foods, dyes, or preservatives pregnant or trying to get pregnant breast-feeding How should I use this medication? This medicine is for injection under the skin. It may be given by a health care professional in a hospital or clinic setting or a health care professional may give you this medicine at home. Talk to your pediatrician regarding the use of this medicine in children. Special care may be needed. Overdosage: If you think you have taken too much of this medicine contact a poison control center or emergency room at once. NOTE: This medicine is only for you. Do not share this medicine with others. What if I miss a dose? It is important not to miss your dose. Call your doctor or health care professional if you are unable to keep an appointment. What may interact with this medication? This medicine may interact with the following medications: certain types of chemotherapy, such as daunorubicin, doxorubicin, epirubicin, and idarubicin This list may not describe all possible interactions. Give your health care provider a list of all the medicines, herbs, non-prescription drugs, or dietary supplements you use. Also tell them if you smoke, drink alcohol, or use illegal drugs. Some items may interact with your medicine. What should I watch for while using this medication? Visit your health care professional for regular checks on your progress. Tell your health care professional if your symptoms do not start to get better or if they get worse. Your condition will be monitored carefully while you are receiving this medicine. Do not become pregnant while taking this medicine or for 7 months after stopping it. Women should inform their doctor if they wish to become pregnant or think they might be pregnant. There is a potential for serious side effects to an unborn  child. Talk to your health care professional or pharmacist for more information. What side effects may I notice from receiving this medication? Side effects that you should report to your doctor or health care professional as soon as possible: allergic reactions like skin rash, itching or hives, swelling of the face, lips, or tongue breathing problems chest pain cough dizziness feeling faint; lightheaded, falls fever or chills loss of consciousness pain, tingling, numbness in the hands or feet palpitations sudden weight gain swelling of the ankles or legs unusually weak or tired Side effects that usually do not require medical attention (report these to your doctor or health care professional if they continue or are bothersome): diarrhea hair loss joint pain muscle pain nausea, vomiting pain, redness, or irritation at site where injected tiredness This list may not describe all possible side effects. Call your doctor for medical advice about side effects. You may report side effects to FDA at 1-800-FDA-1088. Where should I keep my medication? Keep out of the reach of children. If you are using this medicine at home, you will be instructed on how to store it. Throw away any unused medicine after the expiration date on the label. NOTE: This sheet is a summary. It may not cover all possible information. If you have questions about this medicine, talk to your doctor,  pharmacist, or health care provider.  2023 Elsevier/Gold Standard (2021-03-11 00:00:00)

## 2021-10-29 LAB — CANCER ANTIGEN 27.29: CA 27.29: 35.9 U/mL (ref 0.0–38.6)

## 2021-11-14 ENCOUNTER — Other Ambulatory Visit: Payer: Self-pay

## 2021-11-17 ENCOUNTER — Other Ambulatory Visit: Payer: Self-pay

## 2021-11-18 ENCOUNTER — Inpatient Hospital Stay: Payer: Self-pay

## 2021-11-18 ENCOUNTER — Other Ambulatory Visit: Payer: Self-pay | Admitting: *Deleted

## 2021-11-18 VITALS — BP 114/75 | HR 78 | Temp 97.8°F | Resp 18 | Ht 67.0 in | Wt 201.5 lb

## 2021-11-18 DIAGNOSIS — Z853 Personal history of malignant neoplasm of breast: Secondary | ICD-10-CM

## 2021-11-18 DIAGNOSIS — Z170 Estrogen receptor positive status [ER+]: Secondary | ICD-10-CM

## 2021-11-18 MED ORDER — DIPHENHYDRAMINE HCL 25 MG PO CAPS
50.0000 mg | ORAL_CAPSULE | Freq: Once | ORAL | Status: AC
  Administered 2021-11-18: 50 mg via ORAL
  Filled 2021-11-18: qty 2

## 2021-11-18 MED ORDER — ACETAMINOPHEN 325 MG PO TABS
650.0000 mg | ORAL_TABLET | Freq: Once | ORAL | Status: AC
  Administered 2021-11-18: 650 mg via ORAL
  Filled 2021-11-18: qty 2

## 2021-11-18 MED ORDER — PERTUZ-TRASTUZ-HYALURON-ZZXF 60-60-2000 MG-MG-U/ML CHEMO ~~LOC~~ SOLN
10.0000 mL | Freq: Once | SUBCUTANEOUS | Status: AC
  Administered 2021-11-18: 10 mL via SUBCUTANEOUS
  Filled 2021-11-18: qty 10

## 2021-11-18 NOTE — Patient Instructions (Addendum)
[facility]  Discharge Instructions: Thank you for choosing [facility] to provide your oncology and hematology care.   If you have a lab appointment with the [facility], please go directly to the [facility] and check in at the registration area.   Wear comfortable clothing and clothing appropriate for easy access to any Portacath or PICC line.   We strive to give you quality time with your provider. You may need to reschedule your appointment if you arrive late (15 or more minutes).  Arriving late affects you and other patients whose appointments are after yours.  Also, if you miss three or more appointments without notifying the office, you may be dismissed from the clinic at the provider's discretion.      For prescription refill requests, have your pharmacy contact our office and allow 72 hours for refills to be completed.    Today you received the following chemotherapy and/or immunotherapy agents Phesgo      To help prevent nausea and vomiting after your treatment, we encourage you to take your nausea medication as directed.  BELOW ARE SYMPTOMS THAT SHOULD BE REPORTED IMMEDIATELY: *FEVER GREATER THAN 100.4 F (38 C) OR HIGHER *CHILLS OR SWEATING *NAUSEA AND VOMITING THAT IS NOT CONTROLLED WITH YOUR NAUSEA MEDICATION *UNUSUAL SHORTNESS OF BREATH *UNUSUAL BRUISING OR BLEEDING *URINARY PROBLEMS (pain or burning when urinating, or frequent urination) *BOWEL PROBLEMS (unusual diarrhea, constipation, pain near the anus) TENDERNESS IN MOUTH AND THROAT WITH OR WITHOUT PRESENCE OF ULCERS (sore throat, sores in mouth, or a toothache) UNUSUAL RASH, SWELLING OR PAIN  UNUSUAL VAGINAL DISCHARGE OR ITCHING   Items with * indicate a potential emergency and should be followed up as soon as possible or go to the Emergency Department if any problems should occur.  Please show the CHEMOTHERAPY ALERT CARD or IMMUNOTHERAPY ALERT CARD at check-in to the  Emergency Department and triage nurse.  Should you have questions after your visit or need to cancel or reschedule your appointment, please contact [facility]  Dept: [phone]  and follow the prompts.  Office hours are 8:00 a.m. to 4:30 p.m. Monday - Friday. Please note that voicemails left after 4:00 p.m. may not be returned until the following business day.  We are closed weekends and major holidays. You have access to a nurse at all times for urgent questions. Please call the main number to the clinic Dept: [phone] and follow the prompts.   For any non-urgent questions, you may also contact your provider using MyChart. We now offer e-Visits for anyone 67 and older to request care online for non-urgent symptoms. For details visit [email or web address].   Also download the MyChart app! Go to the app store, search "MyChart", open the app, select [facility], and log in with your MyChart username and password.  Masks are optional in the cancer centers. If you would like for your care team to wear a mask while they are taking care of you, please let them know. For doctor visits, patients may have with them one support person who is at least 61 years old. At this time, visitors are not allowed in the infusion area.

## 2021-11-21 ENCOUNTER — Telehealth: Payer: Self-pay

## 2021-11-21 NOTE — Telephone Encounter (Addendum)
Telephoned patient at mobile number. Left a message with [facility] contact information.  Telephoned patient at mobile number. Left a voice message with B[facility]CP contact information.

## 2021-11-22 ENCOUNTER — Other Ambulatory Visit: Payer: Self-pay

## 2021-12-09 ENCOUNTER — Inpatient Hospital Stay: Payer: Self-pay | Attending: Hematology

## 2021-12-09 VITALS — BP 104/66 | HR 76 | Temp 97.8°F | Resp 18 | Ht 67.0 in | Wt 199.2 lb

## 2021-12-09 DIAGNOSIS — C7951 Secondary malignant neoplasm of bone: Secondary | ICD-10-CM | POA: Insufficient documentation

## 2021-12-09 DIAGNOSIS — Z170 Estrogen receptor positive status [ER+]: Secondary | ICD-10-CM | POA: Insufficient documentation

## 2021-12-09 DIAGNOSIS — C50411 Malignant neoplasm of upper-outer quadrant of right female breast: Secondary | ICD-10-CM | POA: Insufficient documentation

## 2021-12-09 DIAGNOSIS — Z5111 Encounter for antineoplastic chemotherapy: Secondary | ICD-10-CM | POA: Insufficient documentation

## 2021-12-09 MED ORDER — DIPHENHYDRAMINE HCL 25 MG PO CAPS
50.0000 mg | ORAL_CAPSULE | Freq: Once | ORAL | Status: AC
  Administered 2021-12-09: 50 mg via ORAL
  Filled 2021-12-09: qty 2

## 2021-12-09 MED ORDER — ACETAMINOPHEN 325 MG PO TABS
650.0000 mg | ORAL_TABLET | Freq: Once | ORAL | Status: AC
  Administered 2021-12-09: 650 mg via ORAL
  Filled 2021-12-09: qty 2

## 2021-12-09 MED ORDER — PERTUZ-TRASTUZ-HYALURON-ZZXF 60-60-2000 MG-MG-U/ML CHEMO ~~LOC~~ SOLN
10.0000 mL | Freq: Once | SUBCUTANEOUS | Status: AC
  Administered 2021-12-09: 10 mL via SUBCUTANEOUS
  Filled 2021-12-09: qty 10

## 2021-12-09 NOTE — Patient Instructions (Signed)
[facility]  Discharge Instructions: Thank you for choosing [facility] to provide your oncology and hematology care.   If you have a lab appointment with the [facility], please go directly to the [facility] and check in at the registration area.   Wear comfortable clothing and clothing appropriate for easy access to any Portacath or PICC line.   We strive to give you quality time with your provider. You may need to reschedule your appointment if you arrive late (15 or more minutes).  Arriving late affects you and other patients whose appointments are after yours.  Also, if you miss three or more appointments without notifying the office, you may be dismissed from the clinic at the provider's discretion.      For prescription refill requests, have your pharmacy contact our office and allow 72 hours for refills to be completed.    Today you received the following chemotherapy and/or immunotherapy agents Phesgo      To help prevent nausea and vomiting after your treatment, we encourage you to take your nausea medication as directed.  BELOW ARE SYMPTOMS THAT SHOULD BE REPORTED IMMEDIATELY: *FEVER GREATER THAN 100.4 F (38 C) OR HIGHER *CHILLS OR SWEATING *NAUSEA AND VOMITING THAT IS NOT CONTROLLED WITH YOUR NAUSEA MEDICATION *UNUSUAL SHORTNESS OF BREATH *UNUSUAL BRUISING OR BLEEDING *URINARY PROBLEMS (pain or burning when urinating, or frequent urination) *BOWEL PROBLEMS (unusual diarrhea, constipation, pain near the anus) TENDERNESS IN MOUTH AND THROAT WITH OR WITHOUT PRESENCE OF ULCERS (sore throat, sores in mouth, or a toothache) UNUSUAL RASH, SWELLING OR PAIN  UNUSUAL VAGINAL DISCHARGE OR ITCHING   Items with * indicate a potential emergency and should be followed up as soon as possible or go to the Emergency Department if any problems should occur.  Please show the CHEMOTHERAPY ALERT CARD or IMMUNOTHERAPY ALERT CARD at check-in to the  Emergency Department and triage nurse.  Should you have questions after your visit or need to cancel or reschedule your appointment, please contact [facility]  Dept: [phone]  and follow the prompts.  Office hours are 8:00 a.m. to 4:30 p.m. Monday - Friday. Please note that voicemails left after 4:00 p.m. may not be returned until the following business day.  We are closed weekends and major holidays. You have access to a nurse at all times for urgent questions. Please call the main number to the clinic Dept: [phone] and follow the prompts.   For any non-urgent questions, you may also contact your provider using MyChart. We now offer e-Visits for anyone 26 and older to request care online for non-urgent symptoms. For details visit [email or web address].   Also download the MyChart app! Go to the app store, search "MyChart", open the app, select [facility], and log in with your MyChart username and password.  Masks are optional in the cancer centers. If you would like for your care team to wear a mask while they are taking care of you, please let them know. For doctor visits, patients may have with them one support person who is at least 61 years old. At this time, visitors are not allowed in the infusion area.

## 2021-12-19 ENCOUNTER — Telehealth: Payer: Self-pay

## 2021-12-19 NOTE — Telephone Encounter (Signed)
Patient returned call to [facility]. Patient not eligible for the BCCCP progam after answering the screening questions.

## 2021-12-22 ENCOUNTER — Other Ambulatory Visit: Payer: Self-pay

## 2021-12-30 ENCOUNTER — Inpatient Hospital Stay: Payer: Self-pay | Attending: Hematology

## 2021-12-30 ENCOUNTER — Other Ambulatory Visit: Payer: Self-pay | Admitting: Hematology

## 2021-12-30 VITALS — BP 108/70 | HR 72 | Temp 97.7°F | Resp 18 | Wt 199.2 lb

## 2021-12-30 DIAGNOSIS — C787 Secondary malignant neoplasm of liver and intrahepatic bile duct: Secondary | ICD-10-CM | POA: Insufficient documentation

## 2021-12-30 DIAGNOSIS — I10 Essential (primary) hypertension: Secondary | ICD-10-CM | POA: Insufficient documentation

## 2021-12-30 DIAGNOSIS — I427 Cardiomyopathy due to drug and external agent: Secondary | ICD-10-CM | POA: Insufficient documentation

## 2021-12-30 DIAGNOSIS — E876 Hypokalemia: Secondary | ICD-10-CM | POA: Insufficient documentation

## 2021-12-30 DIAGNOSIS — G620 Drug-induced polyneuropathy: Secondary | ICD-10-CM | POA: Insufficient documentation

## 2021-12-30 DIAGNOSIS — C50411 Malignant neoplasm of upper-outer quadrant of right female breast: Secondary | ICD-10-CM

## 2021-12-30 DIAGNOSIS — Z170 Estrogen receptor positive status [ER+]: Secondary | ICD-10-CM | POA: Insufficient documentation

## 2021-12-30 DIAGNOSIS — C7951 Secondary malignant neoplasm of bone: Secondary | ICD-10-CM | POA: Insufficient documentation

## 2021-12-30 DIAGNOSIS — Z5111 Encounter for antineoplastic chemotherapy: Secondary | ICD-10-CM | POA: Insufficient documentation

## 2021-12-30 MED ORDER — PERTUZ-TRASTUZ-HYALURON-ZZXF 60-60-2000 MG-MG-U/ML CHEMO ~~LOC~~ SOLN
10.0000 mL | Freq: Once | SUBCUTANEOUS | Status: AC
  Administered 2021-12-30: 10 mL via SUBCUTANEOUS
  Filled 2021-12-30: qty 10

## 2021-12-30 MED ORDER — ACETAMINOPHEN 325 MG PO TABS
650.0000 mg | ORAL_TABLET | Freq: Once | ORAL | Status: AC
  Administered 2021-12-30: 650 mg via ORAL
  Filled 2021-12-30: qty 2

## 2021-12-30 MED ORDER — DIPHENHYDRAMINE HCL 25 MG PO CAPS
50.0000 mg | ORAL_CAPSULE | Freq: Once | ORAL | Status: AC
  Administered 2021-12-30: 50 mg via ORAL
  Filled 2021-12-30: qty 2

## 2021-12-30 NOTE — Patient Instructions (Signed)
[facility]  Discharge Instructions: Thank you for choosing [facility] to provide your oncology and hematology care.   If you have a lab appointment with the [facility], please go directly to the [facility] and check in at the registration area.   Wear comfortable clothing and clothing appropriate for easy access to any Portacath or PICC line.   We strive to give you quality time with your provider. You may need to reschedule your appointment if you arrive late (15 or more minutes).  Arriving late affects you and other patients whose appointments are after yours.  Also, if you miss three or more appointments without notifying the office, you may be dismissed from the clinic at the provider's discretion.      For prescription refill requests, have your pharmacy contact our office and allow 72 hours for refills to be completed.    Today you received the following chemotherapy and/or immunotherapy agents Phesgo      To help prevent nausea and vomiting after your treatment, we encourage you to take your nausea medication as directed.  BELOW ARE SYMPTOMS THAT SHOULD BE REPORTED IMMEDIATELY: *FEVER GREATER THAN 100.4 F (38 C) OR HIGHER *CHILLS OR SWEATING *NAUSEA AND VOMITING THAT IS NOT CONTROLLED WITH YOUR NAUSEA MEDICATION *UNUSUAL SHORTNESS OF BREATH *UNUSUAL BRUISING OR BLEEDING *URINARY PROBLEMS (pain or burning when urinating, or frequent urination) *BOWEL PROBLEMS (unusual diarrhea, constipation, pain near the anus) TENDERNESS IN MOUTH AND THROAT WITH OR WITHOUT PRESENCE OF ULCERS (sore throat, sores in mouth, or a toothache) UNUSUAL RASH, SWELLING OR PAIN  UNUSUAL VAGINAL DISCHARGE OR ITCHING   Items with * indicate a potential emergency and should be followed up as soon as possible or go to the Emergency Department if any problems should occur.  Please show the CHEMOTHERAPY ALERT CARD or IMMUNOTHERAPY ALERT CARD at check-in to the  Emergency Department and triage nurse.  Should you have questions after your visit or need to cancel or reschedule your appointment, please contact [facility]  Dept: [phone]  and follow the prompts.  Office hours are 8:00 a.m. to 4:30 p.m. Monday - Friday. Please note that voicemails left after 4:00 p.m. may not be returned until the following business day.  We are closed weekends and major holidays. You have access to a nurse at all times for urgent questions. Please call the main number to the clinic Dept: [phone] and follow the prompts.   For any non-urgent questions, you may also contact your provider using MyChart. We now offer e-Visits for anyone 27 and older to request care online for non-urgent symptoms. For details visit [email or web address].   Also download the MyChart app! Go to the app store, search "MyChart", open the app, select [facility], and log in with your MyChart username and password.  Masks are optional in the cancer centers. If you would like for your care team to wear a mask while they are taking care of you, please let them know. For doctor visits, patients may have with them one support person who is at least 61 years old. At this time, visitors are not allowed in the infusion area.

## 2022-01-10 ENCOUNTER — Other Ambulatory Visit (HOSPITAL_COMMUNITY): Payer: Self-pay | Admitting: Internal Medicine

## 2022-01-20 ENCOUNTER — Inpatient Hospital Stay: Payer: Self-pay

## 2022-01-20 ENCOUNTER — Encounter: Payer: Self-pay | Admitting: Hematology

## 2022-01-20 ENCOUNTER — Inpatient Hospital Stay (HOSPITAL_BASED_OUTPATIENT_CLINIC_OR_DEPARTMENT_OTHER): Payer: Self-pay | Admitting: Hematology

## 2022-01-20 VITALS — BP 132/72 | HR 75 | Temp 97.7°F | Resp 18 | Ht 67.0 in | Wt 202.3 lb

## 2022-01-20 DIAGNOSIS — C50411 Malignant neoplasm of upper-outer quadrant of right female breast: Secondary | ICD-10-CM

## 2022-01-20 DIAGNOSIS — Z170 Estrogen receptor positive status [ER+]: Secondary | ICD-10-CM

## 2022-01-20 LAB — CMP (CANCER CENTER ONLY)
ALT: 8 U/L (ref 0–44)
AST: 12 U/L — ABNORMAL LOW (ref 15–41)
Albumin: 4.2 g/dL (ref 3.5–5.0)
Alkaline Phosphatase: 102 U/L (ref 38–126)
Anion gap: 6 (ref 5–15)
BUN: 10 mg/dL (ref 8–23)
CO2: 28 mmol/L (ref 22–32)
Calcium: 8.9 mg/dL (ref 8.9–10.3)
Chloride: 109 mmol/L (ref 98–111)
Creatinine: 0.75 mg/dL (ref 0.44–1.00)
GFR, Estimated: >60 mL/min (ref >60)
Glucose, Bld: 124 mg/dL — ABNORMAL HIGH (ref 70–99)
Potassium: 2.9 mmol/L — ABNORMAL LOW (ref 3.5–5.1)
Sodium: 143 mmol/L (ref 135–145)
Total Bilirubin: 0.3 mg/dL (ref 0.3–1.2)
Total Protein: 7.3 g/dL (ref 6.5–8.1)

## 2022-01-20 LAB — CBC WITH DIFFERENTIAL (CANCER CENTER ONLY)
Abs Immature Granulocytes: 0.02 10*3/uL (ref 0.00–0.07)
Basophils Absolute: 0 10*3/uL (ref 0.0–0.1)
Basophils Relative: 0 %
Eosinophils Absolute: 0.2 10*3/uL (ref 0.0–0.5)
Eosinophils Relative: 2 %
HCT: 39.2 % (ref 36.0–46.0)
Hemoglobin: 13.1 g/dL (ref 12.0–15.0)
Immature Granulocytes: 0 %
Lymphocytes Relative: 47 %
Lymphs Abs: 4.4 10*3/uL — ABNORMAL HIGH (ref 0.7–4.0)
MCH: 29.9 pg (ref 26.0–34.0)
MCHC: 33.4 g/dL (ref 30.0–36.0)
MCV: 89.5 fL (ref 80.0–100.0)
Monocytes Absolute: 0.9 10*3/uL (ref 0.1–1.0)
Monocytes Relative: 10 %
Neutro Abs: 3.7 10*3/uL (ref 1.7–7.7)
Neutrophils Relative %: 41 %
Platelet Count: 309 10*3/uL (ref 150–400)
RBC: 4.38 MIL/uL (ref 3.87–5.11)
RDW: 14 % (ref 11.5–15.5)
WBC Count: 9.2 10*3/uL (ref 4.0–10.5)
nRBC: 0 % (ref 0.0–0.2)

## 2022-01-20 MED ORDER — DIPHENHYDRAMINE HCL 25 MG PO CAPS
50.0000 mg | ORAL_CAPSULE | Freq: Once | ORAL | Status: AC
  Administered 2022-01-20: 50 mg via ORAL
  Filled 2022-01-20: qty 2

## 2022-01-20 MED ORDER — GABAPENTIN 100 MG PO CAPS: 100.0000 mg | ORAL_CAPSULE | Freq: Every day | ORAL | 2 refills | Status: DC

## 2022-01-20 MED ORDER — LETROZOLE 2.5 MG PO TABS: 2.5000 mg | ORAL_TABLET | Freq: Every day | ORAL | 1 refills | Status: DC

## 2022-01-20 MED ORDER — ACETAMINOPHEN 325 MG PO TABS
650.0000 mg | ORAL_TABLET | Freq: Once | ORAL | Status: AC
  Administered 2022-01-20: 650 mg via ORAL
  Filled 2022-01-20: qty 2

## 2022-01-20 MED ORDER — PERTUZ-TRASTUZ-HYALURON-ZZXF 60-60-2000 MG-MG-U/ML CHEMO ~~LOC~~ SOLN
10.0000 mL | Freq: Once | SUBCUTANEOUS | Status: AC
  Administered 2022-01-20: 10 mL via SUBCUTANEOUS
  Filled 2022-01-20: qty 10

## 2022-01-20 MED ORDER — SODIUM CHLORIDE 0.9% FLUSH
10.0000 mL | Freq: Once | INTRAVENOUS | Status: AC
  Administered 2022-01-20: 10 mL

## 2022-01-20 MED ORDER — HEPARIN SOD (PORK) LOCK FLUSH 100 UNIT/ML IV SOLN
500.0000 [IU] | Freq: Once | INTRAVENOUS | Status: AC
  Administered 2022-01-20: 500 [IU]

## 2022-01-20 NOTE — Progress Notes (Signed)
[facility]   [phone] [phone]   Clinic Follow up Note   Patient Care Team: Julian Hy, PA-C as PCP - General (Physician Assistant) [name], MD as Consulting Physician (General Surgery) Truitt Merle, MD as Consulting Physician (Hematology) Kyung Rudd, MD as Consulting Physician (Radiation Oncology) Mauro Kaufmann, RN as Oncology Nurse Navigator Rockwell Germany, RN as Oncology Nurse Navigator  Date of Service:  01/20/2022  CHIEF COMPLAINT: f/u of metastatic breast cancer  CURRENT THERAPY:  -Maintenance Herceptin and Perjeta (Phesgo) q3weeks starting 06/18/20, held 09/22/20-11/24/20 due to low EF -Letrozole 2.26m daily starting 06/18/20  ASSESSMENT & PLAN:  KAdriana Stewart a 61y.o. female with   1. Malignant neoplasm of upper-outer quadrant of right breast, ductal carcinoma, Stage IIA, c(T3N1Mx), with bone and possible liver mets,  ER+/PR+/HER2+, Grade II-III  -diagnosed in 11/2019 with large right breast mass measuring up to 11cm and right lymphadenopathy. 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 Left axillary node biopsy was benign.  -01/01/20 Liver biopsy was negative but 01/12/20 Bone Biopsy was positive for metastatic carcinoma from primary breast cancer. ER/PR/HER2 were all negative, possibly related to decalcification. -She received THP 01/03/20 - 05/27/20. Restaging PET scan from 04/28/20 showed no hypermetabolic lesions. Given her excellent response, I switched to maintenance therapy with Herceptin, Perjeta (Phesgo) q3weeks and Letrozole starting 06/18/20.   -Restaging CT and bone scan on 09/03/20 showed interval treatment response, both to osseous metastases and liver lesions. Will continue current maintenance therapy.  -Echo in 09/2020 showed dropped EF 45-50%, treatment held temporarily. EF recovered well and remains stable, most recently 55-60% on 10/13/21. Repeat scheduled for  01/26/22. -her last mammogram was in 10/2019. She Stewart not eligible for BCCCP program. -we will plan for restaging CT scan when she Stewart covered in 04/2022; I ordered today. --labs reviewed, CBC and CMP WNL, adequate for treatment. She continues to tolerate well with no side effects, will continue.   2. Bone metastases from breast cancer -bone scan on 12/10/19 showed metastatic disease in left acetabulum, L3, and right 10th rib. -01/12/20 Bone Biopsy was positive for metastatic carcinoma from primary breast cancer. -PET on 04/28/20 after THP treatment showed no active disease. -Zometa started 06/18/20, but held after second dose due to loss of insurance.  I encouraged her to keep up with dental care. She has no active dental issues. She Stewart on calcium and VitD -most recent bone scan on 09/03/20 showed continued improvement.   3. Social Support -she lost insurance in 11/2020. -she Stewart receiving Phesgo through manufacturer.  -we are postponing her restaging scan until she has insurance again, which should be in 04/2022. I am comfortable with this given her normal tumor marker and good clinical disposition.   4. Chemotherapy-induced cardiomyopathy, Neuropathy -echo 09/22/20 showed decreased EF. She was started on losartan by Dr. BHaroldine Lawson 10/21/20 and EF improved. -most recent echo on 10/13/21 was stable. Repeat scheduled for 01/26/22. -mild neuropathy managed with gabapentin.   5. Hypokalemia -K 2.9 today, she Stewart on oral KCL 117m daily. will increase to 3016mday for next week, then 17m17maily    PLAN:  -proceed with Phesgo injection today -continue letrozole, I refilled today -I also refilled gabapentin -repeat echo 10/5 -Phesgo every 3 weeks             -she prefers [facility] for appointment on days she doesn't see me             -  she prefers Friday appointments -lab, flush, and f/u in 9 weeks -plan for restaging scan and zometa infusion in 04/2022, will order scan at next visit   No  problem-specific Assessment & Plan notes found for this encounter.   SUMMARY OF ONCOLOGIC HISTORY: Oncology History Overview Note  Cancer Staging Malignant neoplasm of upper-outer quadrant of right breast in female, estrogen receptor positive ([place]) Staging form: Breast, AJCC 8th Edition - Clinical stage from 11/25/2019: Stage IIA (cT3, cN1, cM0, G2, ER+, PR+, HER2+) - Signed by Truitt Merle, MD on 12/03/2019    Malignant neoplasm of upper-outer quadrant of right breast in female, estrogen receptor positive ([place])  11/13/2019 Mammogram    Diagnostic Mammogram 11/13/19  IMPRESSION: -Highly suspicious mass and calcifications in the right breast centered between 9 and 12 o'clock spanning up to 11 cm.  -Multiple masses in the right axilla. One of the masses contains calcifications, denoted as mass number 2 measuring 9 x 11 by 10 mm. Several of these masses do not appear to represent definitive lymph nodes. Others may be small but abnormal appearing lymph nodes. Taken in total, a cluster of masses in the right axilla spans up to 3.7 cm.  -There are fibrocystic changes on the left. There are also some solid-appearing masses. A solid appearing mass at 10:30, 6 cm from the nipple measures 9 x 6 x 6 mm. An adjacent solid mass at 10 o'clock, 6 cm from the nipple measures 8 x 6 by 5 mm. Another solid-appearing mass at 11 o'clock, 1 cm from the nipple measures 5 x 4 by 5 mm. Fibrocystic changes are seen at 10 o'clock, 12 o'clock, and 4 o'clock. Another solid-appearing mass seen at 5 o'clock, 4 cm from the nipple measuring 6 x 3 x 4 mm. There Stewart a single abnormal node in the left axilla with a cortex measuring 5 mm and restriction of the fatty hilum.   11/25/2019 Initial Biopsy   Diagnosis 1. Breast, right, needle core biopsy, upper outer - INVASIVE MAMMARY CARCINOMA - MAMMARY CARCINOMA IN SITU WITH CALCIFICATIONS AND NECROSIS - SEE COMMENT 2. Lymph node, needle/core biopsy, right axilla - INVASIVE  MAMMARY CARCINOMA - SEE COMMENT 3. Lymph node, needle/core biopsy, left axilla - BENIGN LYMPH NODE - NO CARCINOMA IDENTIFIED Microscopic Comment 1. The biopsy material shows an infiltrative proliferation of cells arranged linearly and in small clusters. Based on the biopsy, the carcinoma appears Nottingham grade 2 of 3 and measures 1.2 cm in greatest linear extent. E-cadherin and prognostic markers (ER/PR/ki-67/HER2)are pending and will be reported in an addendum. Dr. Tresa Moore reviewed the case and agrees with the above diagnosis. These results were called to [facility] on November 26, 2019. 2. Definitive nodal tissue Stewart not identified. This biopsy has a slightly different architecture morphology than what Stewart seen in part 1. The carcinoma measures 0.7 cm in greatest dimension. E-cadherin and prognostic markers (ER, PR, Ki-67 and HER-2) are pending and will be reported in an addendum.   11/25/2019 Receptors her2   1. PROGNOSTIC INDICATORS Results: IMMUNOHISTOCHEMICAL AND MORPHOMETRIC ANALYSIS PERFORMED MANUALLY The tumor cells are POSITIVE for Her2 (3+). Estrogen Receptor: 90%, POSITIVE, STRONG STAINING INTENSITY Progesterone Receptor: 20%, POSITIVE, STRONG STAINING INTENSITY Proliferation Marker Ki67: 30%  2. PROGNOSTIC INDICATORS Results: IMMUNOHISTOCHEMICAL AND MORPHOMETRIC ANALYSIS PERFORMED MANUALLY The tumor cells are POSITIVE for Her2 (3+). Estrogen Receptor: 90%, POSITIVE, STRONG STAINING INTENSITY Progesterone Receptor: 25%, POSITIVE, STRONG STAINING INTENSITY Proliferation Marker Ki67: 30%   11/25/2019 Cancer Staging   Staging  form: Breast, AJCC 8th Edition - Clinical stage from 11/25/2019: Stage IIA (cT3, cN1, cM0, G2, ER+, PR+, HER2+) - Signed by Truitt Merle, MD on 12/03/2019   12/01/2019 Initial Diagnosis   Malignant neoplasm of upper-outer quadrant of right breast in female, estrogen receptor positive ([place])   12/10/2019 Imaging   Bone Scan   IMPRESSION: Findings concerning for bony metastatic disease in the left acetabulum region, L3 vertebral body region, and anterolateral right tenth rib.   Increased uptake in several large joints likely Stewart of arthropathic etiology.   12/11/2019 Imaging   CT CAP w contrast  IMPRESSION: Prominent glandular tissue in the central right breast with nipple retraction, likely corresponding to the patient's known right breast neoplasm.   Prominent right axillary nodes measuring up to 8 mm short axis, suspicious for nodal metastases in this clinical context.   3 mm subpleural pulmonary nodule in the right lower lobe, likely benign. However, follow-up CT chest Stewart suggested in 3-6 months.   Multiple hepatic lesions, including a dominant 2.4 cm lesion in segment 6, which Stewart considered indeterminate. Metastasis Stewart not excluded. Consider MRI abdomen with/without contrast for further characterization.   11 mm sclerotic lesion along the left posterior aspect of the T7 vertebral body raises concern for isolated osseous metastasis. Correlate with priors if available. Benign vertebral hemangioma at T11.   12/12/2019 Breast MRI   IMPRESSION: 1. The biopsy proven malignancy in the right breast involves all 4 quadrants and spans up to 9 cm by MRI. There Stewart enhancement within the skin at the level of the nipple.   2. Multiple matted masses are seen in the right axilla, either abnormal metastatic lymph nodes or a combination of masses and metastatic lymph nodes. One of these has been previously biopsied showing invasive mammary carcinoma without definitive nodal tissue.   3.  There Stewart a 1.5 cm Rotter's lymph node on the right.   4. There Stewart markedly abnormal enhancement and edema in the right pectoralis major muscle spanning 6-7 cm, suspicious for metastatic disease.   5. There Stewart diffuse nodular enhancement throughout the left breast, which may correspond with the solid-appearing masses seen  on ultrasound. These all have a similar appearance on MRI.   6.  No findings of left axillary lymphadenopathy.   12/15/2019 Procedure   PAC placed    12/24/2019 Imaging   MRI abdomen  IMPRESSION: 1. Multifocal enhancing bone lesions are noted within the lumbar spine, and bony pelvis compatible with metastatic disease. 2. Four, small peripherally enhancing cystic lesions are noted within the liver worrisome for metastatic disease. 3. 2 enhancing liver lesions are also noted with signal and enhancement characteristics consistent with benign liver hemangioma. 4. Well-circumscribed, enhancing T2 and T1 hypointense structure within the right adnexal region Stewart indeterminate. It Stewart unclear whether not this Stewart arising from the right ovary, broad ligament or right side of uterine fundus. Differential considerations include fibrothecoma, versus pedunculated uterine leiomyoma. Metastatic disease Stewart considered less favored. Attention on follow-up imaging Stewart recommended.     01/03/2020 - 05/27/2020 Chemotherapy   First line THP q3weeks starting 01/03/20-05/27/20   01/05/2020 Imaging   US Abdomen  IMPRESSION: 1. Suspect hematoma in the medial right lobe of the liver in the area of previous biopsy. Note that recent MR does not show lesion in this area. This area has ill-defined margins and measures 3.9 x 4.0 x 3.5 cm. No perihepatic fluid.   2. Probable hemangiomas elsewhere in the right lobe of the liver.  Note that several smaller lesions seen on recent MR are not appreciable by ultrasound.   3.  Study otherwise unremarkable.   01/12/2020 Pathology Results   FINAL MICROSCOPIC DIAGNOSIS:   A. BONE, SCLEROTIC LESION, LEFT ANTERIOR ILIAC SPINE, BIOPSY:  - Metastatic carcinoma, consistent with patient's clinical history of  primary breast carcinoma  - See comment   COMMENT:  Immunohistochemical stains show that the tumor cells are positive for  CK7 and GATA3; and negative for CK20, consistent  with above  interpretation.   PROGNOSTIC INDICATOR RESULTS:  The tumor cells are NEGATIVE for Her2 (0); see comment  Estrogen Receptor: NEGATIVE; see comment  Progesterone Receptor: NEGATIVE; see comment    03/23/2020 Imaging   CT CAP IMPRESSION: 1. Right lower lobe perifissural nodule Stewart slightly more conspicuous on today's study. Close attention on follow-up recommended. 2. Interval progression of segment IV liver lesion, concerning for metastatic progression. The remaining visualized liver lesions are stable to smaller in the interval. 3. Interval evolution in appearance of thoracolumbar spinal lesions and left iliac crest lesion, potentially reflecting response to therapy/healing. No definite new osseous lesions on today's study. 4. New small fluid collection right cul-de-sac. 5. Probable fibroid change in the uterus including exophytic right fundal lesion. 6. Aortic Atherosclerosis (ICD10-I70.0).   03/31/2020 Imaging   MR ABD IMPRESSION: 1. Enlarging hepatic lesion in hepatic subsegment IV suspicious for hepatic metastasis. The it Stewart uncertain whether the change in the short interval Stewart due to technical factors with different modalities or true enlargement. 2. Lesion in the posterior RIGHT hepatic lobe with imaging features that are atypical for hemangioma but stability and signs of enhancement on later phase raising this question. Another more classic appearing may angioma seen inferior to this location in hepatic subsegment VI. Attention on follow-up. 3. Heterogeneous enhancement throughout the spine particularly at L3 as exhibited on the prior study with very patchy marrow signal remains suspicious for bony metastatic lesions in this patient with known bone metastases.   04/01/2020 Imaging   Bone scan IMPRESSION: Good response to therapy with resolution of sites of uptake seen previously at the cervical spine and posterior RIGHT tenth rib as well as a diminished  degree of uptake at remaining previously identified osseous metastatic foci.   04/28/2020 PET scan   IMPRESSION: 1. No hypermetabolic lesions to suggest active metastatic disease involving the liver or osseous structures. 2. Stable small low-attenuation liver lesions, likely treated disease.   06/18/2020 -  Chemotherapy   Given her excellent response, I switched to maintenance therapy with Herceptin, Perjeta (Phesgo) q3weeks and Letrozole.   06/18/2020 -  Chemotherapy   Zometa starting 06/18/20   06/18/2020 -  Anti-estrogen oral therapy   Letrozole 2.35m once dialy.    06/18/2020 -  Chemotherapy   Patient Stewart on Treatment Plan : BREAST Trastuzumab + Pertuzumab q21d     09/03/2020 Imaging   Bone Scan IMPRESSION: 1. Significant interval improvement compared to previous studies. Only mild uptake remains in the pelvis as above. There has been significant interval improvement over time.  CT C/A/P IMPRESSION: 1. Hypodense liver lesions are slightly decreased in size compared to prior examination, consistent with treatment response of hepatic metastatic disease. 2. Unchanged sclerotic lesion of the anterior left iliac crest. Unchanged lytic superior endplate deformity of L3, which remains equivocal for metastatic lesion versus mechanical Schmorl deformity. No CT evidence of new osseous metastatic disease. 3. No evidence of new metastatic disease in the chest, abdomen, or pelvis. 4. Coronary artery disease.  INTERVAL HISTORY:  Carolyn Stewart Stewart here for a follow up of metastatic breast cancer. She was last seen by me on 10/28/21. She presents to the clinic alone. She reports she Stewart doing well overall. She denies any side effects from treatment.   All other systems were reviewed with the patient and are negative.  MEDICAL HISTORY:  Past Medical History:  Diagnosis Date   Cancer ([place]) 2021   Hypertension     SURGICAL HISTORY: Past Surgical History:  Procedure Laterality  Date   left parotid tumor removal   2006   PORTACATH PLACEMENT Left 12/15/2019   Procedure: INSERTION PORT-A-CATH WITH ULTRASOUND GUIDANCE;  Surgeon: [name], MD;  Location: [facility];  Service: General;  Laterality: Left;   TONSILLECTOMY      I have reviewed the social history and family history with the patient and they are unchanged from previous note.  ALLERGIES:  has No Known Allergies.  MEDICATIONS:  Current Outpatient Medications  Medication Sig Dispense Refill   acetaminophen (TYLENOL) 500 MG tablet Take 1,000 mg by mouth every 6 (six) hours as needed for moderate pain or headache.     amLODipine (NORVASC) 10 MG tablet Take 2 tablets (20 mg total) by mouth daily. 60 tablet 11   Cholecalciferol (VITAMIN D3) 50 MCG (2000 UT) TABS Take 2,000 Units by mouth daily.      gabapentin (NEURONTIN) 100 MG capsule Take 1-2 capsules (100-200 mg total) by mouth at bedtime. 60 capsule 2   letrozole (FEMARA) 2.5 MG tablet Take 1 tablet (2.5 mg total) by mouth daily. 90 tablet 1   lidocaine-prilocaine (EMLA) cream Apply 1 application topically as needed. 30 g 1   losartan (COZAAR) 100 MG tablet TAKE ONE TABLET BY MOUTH DAILY 90 tablet 3   metoprolol succinate (TOPROL-XL) 100 MG 24 hr tablet Take 1 tablet (100 mg total) by mouth daily. 30 tablet 6   omeprazole (PRILOSEC OTC) 20 MG tablet Take 20 mg by mouth daily.     potassium chloride (KLOR-CON M) 10 MEQ tablet Take 1 tablet (10 mEq total) by mouth daily. 30 tablet 1   No current facility-administered medications for this visit.    PHYSICAL EXAMINATION: ECOG PERFORMANCE STATUS: 0 - Asymptomatic  Vitals:   01/20/22 0955  BP: 132/72  Pulse: 75  Resp: 18  Temp: 97.7 F (36.5 C)  SpO2: 98%   Wt Readings from Last 3 Encounters:  01/20/22 202 lb 4.8 oz (91.8 kg)  12/30/21 199 lb 3.2 oz (90.4 kg)  12/09/21 199 lb 3.2 oz (90.4 kg)     GENERAL:alert, no distress and comfortable SKIN: skin color, texture, turgor are normal, no rashes or  significant lesions EYES: normal, Conjunctiva are pink and non-injected, sclera clear  LUNGS: clear to auscultation and percussion with normal breathing effort HEART: regular rate & rhythm and no murmurs and no lower extremity edema Musculoskeletal:no cyanosis of digits and no clubbing  NEURO: alert & oriented x 3 with fluent speech, no focal motor/sensory deficits  LABORATORY DATA:  I have reviewed the data as listed    Latest Ref Rng & Units 01/20/2022    9:40 AM 10/28/2021    8:33 AM 08/26/2021    8:11 AM  CBC  WBC 4.0 - 10.5 K/uL 9.2  6.5  6.5   Hemoglobin 12.0 - 15.0 g/dL 13.1  12.6  13.1   Hematocrit 36.0 - 46.0 % 39.2  37.5  40.1   Platelets 150 - 400 K/uL 309  275  276  Latest Ref Rng & Units 01/20/2022    9:40 AM 10/28/2021    8:33 AM 08/26/2021    8:11 AM  CMP  Glucose 70 - 99 mg/dL 124  121  135   BUN 8 - 23 mg/dL _0 Creatinine 0.44 - 1.00 mg/dL 0.75  0.75  0.83   Sodium 135 - 145 mmol/L 143  140  142   Potassium 3.5 - 5.1 mmol/L 2.9  3.3  3.4   Chloride 98 - 111 mmol/L 109  108  109   CO2 22 - 32 mmol/L _1 Calcium 8.9 - 10.3 mg/dL 8.9  9.0  9.3   Total Protein 6.5 - 8.1 g/dL 7.3  7.2  7.6   Total Bilirubin 0.3 - 1.2 mg/dL 0.3  0.4  0.5   Alkaline Phos 38 - 126 U/L 102  112  109   AST 15 - 41 U/L _2 ALT 0 - 44 U/L _3 RADIOGRAPHIC STUDIES: I have personally reviewed the radiological images as listed and agreed with the findings in the report. No results found.    No orders of the defined types were placed in this encounter.  All questions were answered. The patient knows to call the clinic with any problems, questions or concerns. No barriers to learning was detected. The total time spent in the appointment was 30 minutes.     Truitt Merle, MD 01/20/2022   I, Wilburn Mylar, am acting as scribe for Truitt Merle, MD.   I have reviewed the above documentation for accuracy and completeness, and I agree with the  above.

## 2022-01-20 NOTE — Progress Notes (Signed)
Per Dr Burr Medico, ok to treat today with ECHO from 10/13/21.

## 2022-01-20 NOTE — Patient Instructions (Signed)
[facility]   Discharge Instructions: Thank you for choosing [facility] to provide your oncology and hematology care.   If you have a lab appointment with the [facility], please go directly to the [facility] and check in at the registration area.   Wear comfortable clothing and clothing appropriate for easy access to any Portacath or PICC line.   We strive to give you quality time with your provider. You may need to reschedule your appointment if you arrive late (15 or more minutes).  Arriving late affects you and other patients whose appointments are after yours.  Also, if you miss three or more appointments without notifying the office, you may be dismissed from the clinic at the provider's discretion.      For prescription refill requests, have your pharmacy contact our office and allow 72 hours for refills to be completed.    Today you received the following chemotherapy and/or immunotherapy agents: pertuz-trastuz-hyaluronidase (Phesgo)   To help prevent nausea and vomiting after your treatment, we encourage you to take your nausea medication as directed.  BELOW ARE SYMPTOMS THAT SHOULD BE REPORTED IMMEDIATELY: *FEVER GREATER THAN 100.4 F (38 C) OR HIGHER *CHILLS OR SWEATING *NAUSEA AND VOMITING THAT IS NOT CONTROLLED WITH YOUR NAUSEA MEDICATION *UNUSUAL SHORTNESS OF BREATH *UNUSUAL BRUISING OR BLEEDING *URINARY PROBLEMS (pain or burning when urinating, or frequent urination) *BOWEL PROBLEMS (unusual diarrhea, constipation, pain near the anus) TENDERNESS IN MOUTH AND THROAT WITH OR WITHOUT PRESENCE OF ULCERS (sore throat, sores in mouth, or a toothache) UNUSUAL RASH, SWELLING OR PAIN  UNUSUAL VAGINAL DISCHARGE OR ITCHING   Items with * indicate a potential emergency and should be followed up as soon as possible or go to the Emergency Department if any problems should occur.  Please show the CHEMOTHERAPY ALERT CARD or IMMUNOTHERAPY  ALERT CARD at check-in to the Emergency Department and triage nurse.  Should you have questions after your visit or need to cancel or reschedule your appointment, please contact [facility]  Dept: [phone]  and follow the prompts.  Office hours are 8:00 a.m. to 4:30 p.m. Monday - Friday. Please note that voicemails left after 4:00 p.m. may not be returned until the following business day.  We are closed weekends and major holidays. You have access to a nurse at all times for urgent questions. Please call the main number to the clinic Dept: [phone] and follow the prompts.   For any non-urgent questions, you may also contact your provider using MyChart. We now offer e-Visits for anyone 81 and older to request care online for non-urgent symptoms. For details visit [email or web address].   Also download the MyChart app! Go to the app store, search "MyChart", open the app, select [facility], and log in with your MyChart username and password.  Masks are optional in the cancer centers. If you would like for your care team to wear a mask while they are taking care of you, please let them know. You may have one support person who is at least 61 years old accompany you for your appointments.

## 2022-01-21 ENCOUNTER — Other Ambulatory Visit: Payer: Self-pay

## 2022-01-21 LAB — CANCER ANTIGEN 27.29: CA 27.29: 37.3 U/mL (ref 0.0–38.6)

## 2022-01-23 ENCOUNTER — Other Ambulatory Visit: Payer: Self-pay

## 2022-01-23 ENCOUNTER — Telehealth: Payer: Self-pay

## 2022-01-23 ENCOUNTER — Other Ambulatory Visit (HOSPITAL_COMMUNITY): Payer: Self-pay

## 2022-01-23 DIAGNOSIS — T451X5A Adverse effect of antineoplastic and immunosuppressive drugs, initial encounter: Secondary | ICD-10-CM

## 2022-01-23 DIAGNOSIS — C50411 Malignant neoplasm of upper-outer quadrant of right female breast: Secondary | ICD-10-CM

## 2022-01-23 MED ORDER — POTASSIUM CHLORIDE CRYS ER 20 MEQ PO TBCR: 20.0000 meq | EXTENDED_RELEASE_TABLET | Freq: Two times a day (BID) | ORAL | 0 refills | Status: DC

## 2022-01-23 MED ORDER — POTASSIUM CHLORIDE CRYS ER 10 MEQ PO TBCR: 10.0000 meq | EXTENDED_RELEASE_TABLET | Freq: Three times a day (TID) | ORAL | 0 refills | Status: DC

## 2022-01-23 NOTE — Addendum Note (Signed)
Encounter addended by: Stanford Scotland, RN on: 01/23/2022 4:11 PM  Actions taken: Visit diagnoses modified, Order list changed, Diagnosis association updated

## 2022-01-23 NOTE — Telephone Encounter (Signed)
Called patient regarding staff message from Dr. Burr Medico. RN left voicemail instructing patient to increase potassium dose to 30 meq for 1 week and then 20 meq the following week. Advised that patient will be scheduled for a lab recheck in 3 weeks.

## 2022-01-23 NOTE — Progress Notes (Signed)
Orders Placed This Encounter  Procedures   ECHOCARDIOGRAM COMPLETE    Standing Status:   Future    Standing Expiration Date:   01/24/2023    Order Specific Question:   Where should this test be performed    Answer:   [facility]    Order Specific Question:   Perflutren DEFINITY (image enhancing agent) should be administered unless hypersensitivity or allergy exist    Answer:   Administer Perflutren    Order Specific Question:   Reason for exam-Echo    Answer:   Congestive Heart Failure  I50.9    Order Specific Question:   Release to patient    Answer:   Immediate

## 2022-01-26 ENCOUNTER — Encounter (HOSPITAL_COMMUNITY): Payer: Self-pay | Admitting: Internal Medicine

## 2022-01-26 ENCOUNTER — Ambulatory Visit (HOSPITAL_COMMUNITY)
Admission: RE | Admit: 2022-01-26 | Discharge: 2022-01-26 | Disposition: A | Discharge status: Home / Self Care | Payer: Self-pay | Source: Ambulatory Visit | Attending: Internal Medicine | Admitting: Internal Medicine

## 2022-01-26 ENCOUNTER — Ambulatory Visit (HOSPITAL_BASED_OUTPATIENT_CLINIC_OR_DEPARTMENT_OTHER)
Admission: RE | Admit: 2022-01-26 | Discharge: 2022-01-26 | Disposition: A | Discharge status: Home / Self Care | Payer: Self-pay | Source: Ambulatory Visit | Attending: Internal Medicine | Admitting: Internal Medicine

## 2022-01-26 VITALS — BP 120/78 | HR 77 | Wt 205.0 lb

## 2022-01-26 DIAGNOSIS — I427 Cardiomyopathy due to drug and external agent: Secondary | ICD-10-CM | POA: Insufficient documentation

## 2022-01-26 DIAGNOSIS — Z170 Estrogen receptor positive status [ER+]: Secondary | ICD-10-CM | POA: Insufficient documentation

## 2022-01-26 DIAGNOSIS — Z79899 Other long term (current) drug therapy: Secondary | ICD-10-CM | POA: Insufficient documentation

## 2022-01-26 DIAGNOSIS — I509 Heart failure, unspecified: Secondary | ICD-10-CM | POA: Insufficient documentation

## 2022-01-26 DIAGNOSIS — Z79811 Long term (current) use of aromatase inhibitors: Secondary | ICD-10-CM | POA: Insufficient documentation

## 2022-01-26 DIAGNOSIS — C50411 Malignant neoplasm of upper-outer quadrant of right female breast: Secondary | ICD-10-CM

## 2022-01-26 DIAGNOSIS — C7951 Secondary malignant neoplasm of bone: Secondary | ICD-10-CM | POA: Insufficient documentation

## 2022-01-26 DIAGNOSIS — I110 Hypertensive heart disease with heart failure: Secondary | ICD-10-CM | POA: Insufficient documentation

## 2022-01-26 DIAGNOSIS — R9431 Abnormal electrocardiogram [ECG] [EKG]: Secondary | ICD-10-CM | POA: Insufficient documentation

## 2022-01-26 DIAGNOSIS — I10 Essential (primary) hypertension: Secondary | ICD-10-CM

## 2022-01-26 DIAGNOSIS — T451X5A Adverse effect of antineoplastic and immunosuppressive drugs, initial encounter: Secondary | ICD-10-CM | POA: Insufficient documentation

## 2022-01-26 LAB — ECHOCARDIOGRAM COMPLETE
AR max vel: 2.75 cm2
AV Area VTI: 2.41 cm2
AV Area mean vel: 2.58 cm2
AV Mean grad: 3 mmHg
AV Peak grad: 4.7 mmHg
Ao pk vel: 1.08 m/s
Area-P 1/2: 2.58 cm2
S' Lateral: 3.3 cm

## 2022-01-26 LAB — BASIC METABOLIC PANEL
Anion gap: 5 (ref 5–15)
BUN: 15 mg/dL (ref 8–23)
CO2: 26 mmol/L (ref 22–32)
Calcium: 8.9 mg/dL (ref 8.9–10.3)
Chloride: 110 mmol/L (ref 98–111)
Creatinine, Ser: 0.77 mg/dL (ref 0.44–1.00)
GFR, Estimated: >60 mL/min (ref >60)
Glucose, Bld: 101 mg/dL — ABNORMAL HIGH (ref 70–99)
Potassium: 3.6 mmol/L (ref 3.5–5.1)
Sodium: 141 mmol/L (ref 135–145)

## 2022-01-26 MED ORDER — METOPROLOL SUCCINATE ER 100 MG PO TB24: 100.0000 mg | ORAL_TABLET | Freq: Every day | ORAL | 6 refills | Status: DC

## 2022-01-26 NOTE — Patient Instructions (Signed)
Good to see you today!  No medication changes    Labs done today, your results will be available in MyChart, we will contact you for abnormal readings.  Your physician recommends that you schedule a follow-up appointment in:  6 months with Echocardiogram(April 2024) Call in February to schedule an appointment  Your physician has requested that you have an echocardiogram. Echocardiography is a painless test that uses sound waves to create images of your heart. It provides your doctor with information about the size and shape of your heart and how well your heart's chambers and valves are working. This procedure takes approximately one hour. There are no restrictions for this procedure.  If you have any questions or concerns before your next appointment please send [place] a message through [place] or call our office at [phone].    TO LEAVE A MESSAGE FOR THE NURSE SELECT OPTION 2, PLEASE LEAVE A MESSAGE INCLUDING: YOUR NAME DATE OF BIRTH CALL BACK NUMBER REASON FOR CALL**this is important as we prioritize the call backs  YOU WILL RECEIVE A CALL BACK THE SAME DAY AS LONG AS YOU CALL BEFORE 4:00 PM  At the [facility], you and your health needs are our priority. As part of our continuing mission to provide you with exceptional heart care, we have created designated Provider Care Teams. These Care Teams include your primary Cardiologist (physician) and Advanced Practice Providers (APPs- Physician Assistants and Nurse Practitioners) who all work together to provide you with the care you need, when you need it.   You may see any of the following providers on your designated Care Team at your next follow up: Dr Glori Bickers Dr Loralie Champagne Dr. Roxana Hires, NP Lyda Jester, [place] [facility] [place], [place] Forestine Na, NP Audry Riles, PharmD   Please be sure to bring in all your medications bottles to every appointment.

## 2022-01-26 NOTE — Progress Notes (Signed)
  Echocardiogram 2D Echocardiogram has been performed.  Carolyn Stewart 01/26/2022, 11:54 AM

## 2022-01-26 NOTE — Progress Notes (Signed)
CARDIO-ONCOLOGY NOTE  Referring Physician: Dr. Burr Medico Primary Care: None HF Cardiologist: Dr. Haroldine Laws  HPI:  Carolyn Stewart is 61 y.o. female with HTN and stage IV right breast cancer referred by Dr. Burr Medico for enrollment into the Cardio-Oncology program due to reduced EF on echo.   Diagnosed with R breast cancer in 8/21 (triple positive) found to have mets to bone. She received THP 01/03/20-05/27/20. Her restaging PET scan from 04/28/20 showed no hypermetabolic lesions. Given her excellent response, she was switched to maintenance therapy with Herceptin, Perjeta q3weeks and Letrozole starting 06/18/20 which is still ongoing.  Zometa started 06/18/20  In June had echo which showed EF 50-55% so H/P held. Losartan started.   Had covid in July. Received Paxlovid and recovered without need for hospital admission.  Echo 07/22 with EF 55-60%. Herceptin restarted.  Echo 9/22 EF 55% felt to be slightly down from before but still in normal range  Echo 03/09/21 EF 55%  Here for f/u today, continues on maintenance Herceptin, tolerating well. 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 Doing great. Active. No SOB, CP or edema.   Echo today 55-60%  ECHO 10/13/21 EF 55-60% GLS -22.4% ECHO 06/16/21; EF 55-60% GLS -20% ECHO 07/22: EF 55-60% GLS 16 ECHO  09/22/20: EF 40-45%  (I felt 50-55%) ECHO 12/21: EF 60-65% GLS -18.2 ECHO 8/21: EF 60-65% G1DD   Review of Systems: Negative except as mentioned in HPI  Past Medical History:  Diagnosis Date   Cancer ([place]) 2021   Hypertension     Current Outpatient Medications  Medication Sig Dispense Refill   acetaminophen (TYLENOL) 500 MG tablet Take 1,000 mg by mouth every 6 (six) hours as needed for moderate pain or headache.     amLODipine (NORVASC) 10 MG tablet Take 2 tablets (20 mg total) by mouth daily. 60 tablet 11   Cholecalciferol (VITAMIN D3) 50 MCG (2000 UT) TABS Take 2,000 Units by mouth daily.      gabapentin (NEURONTIN) 100 MG capsule Take  1-2 capsules (100-200 mg total) by mouth at bedtime. 60 capsule 2   letrozole (FEMARA) 2.5 MG tablet Take 1 tablet (2.5 mg total) by mouth daily. 90 tablet 1   lidocaine-prilocaine (EMLA) cream Apply 1 application topically as needed. 30 g 1   losartan (COZAAR) 100 MG tablet TAKE ONE TABLET BY MOUTH DAILY 90 tablet 3   metoprolol succinate (TOPROL-XL) 100 MG 24 hr tablet Take 1 tablet (100 mg total) by mouth daily. 30 tablet 6   omeprazole (PRILOSEC OTC) 20 MG tablet Take 20 mg by mouth daily.     potassium chloride (KLOR-CON M) 10 MEQ tablet Take 1 tablet (10 mEq total) by mouth 3 (three) times daily. Take 24mq daily for 1 week then take 231m daily for 30 days. 70 tablet 0   No current facility-administered medications for this encounter.    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   Tobacco Use   Smoking status: Never   Smokeless tobacco: Never  Vaping Use   Vaping Use: Never used  Substance and Sexual Activity   Alcohol use: Yes    Comment: social drinker    Drug use: No  Sexual activity: Yes  Other Topics Concern   Not on file  Social History Narrative   Not on file   Social Determinants of Health   Financial Resource Strain: Low Risk  (12/03/2019)   Overall Financial Resource Strain (CARDIA)    Difficulty of Paying Living Expenses: Not hard at all  Food Insecurity: No Food Insecurity (12/03/2019)   Hunger Vital Sign    Worried About Running Out of Food in the Last Year: Never true    Ran Out of Food in the Last Year: Never true  Transportation Needs: No Transportation Needs (12/03/2019)   PRAPARE - Hydrologist (Medical): No    Lack of Transportation (Non-Medical): No  Physical Activity: Not on file  Stress:  Not on file  Social Connections: Not on file  Intimate Partner Violence: Not on file      Family History  Problem Relation Age of Onset   Hypertension Mother    Hypertension Father     Vitals:   01/26/22 1151  BP: 120/78  Pulse: 77  SpO2: 95%  Weight: 93 kg (205 lb)    Physical exam:  General:  well appearing.  No respiratory difficulty HEENT: normal Neck: supple. No JVD. Carotids 2+ bilat; no bruits. No lymphadenopathy or thyromegaly appreciated. Cor: PMI nondisplaced. Regular rate & rhythm. No rubs, gallops or murmurs. Lungs: clear Abdomen: soft, nontender, nondistended. No hepatosplenomegaly. No bruits or masses. Good bowel sounds. Extremities: no cyanosis, clubbing, rash, edema  Neuro: alert & oriented x 3, cranial nerves grossly intact. moves all 4 extremities w/o difficulty. Affect pleasant.   EKG:  NSR, left axis dev. Mod voltage criteria for LVH, anterolateral infarct, age undetermined. 78bpm  ASSESSMENT & PLAN: 1.  Chemotherapy-induced CM - ECHO 8/21: EF 60-65% G1DD - ECHO 12/21: EF 60-65% GLS -18.2 - ECHO  09/22/20: EF 40-45%  (I felt 50-55%) - Herceptin held in June 2022 d/t mild cardiomyopathy. Restarted in August 2022 - ECHO 07/22: EF 55-60% GLS 16 - ECHO 9/22 F 55%. - Echo 03/09/21 EF 55% (completely stable) - Echo 06/16/21; EF 55-60% GLS -20% - Echo 10/13/21 EF 55-60% GLS -22.4% - Echo today 55-60% - Continue losartan 100 mg daily and Toprol 100 daily - Continue Herceptin. Stable, continue echos every 6 months  2. Stage IV Right Breast Cancer (triple positive) - Diagnosed with R breast cancer in 8/21 (triple positive) found to have mets to bone. She received THP 01/03/20-05/27/20. Her restaging PET scan from 04/28/20 showed no hypermetabolic lesions. Given her excellent response, she was switched to maintenance therapy with Herceptin, Perjeta q3weeks and Letrozole starting 06/18/20.  Zometa started 06/18/20, will continue every 3 months. - Herceptin held 09/22/20  due to reduced EF, restarted in August as above.  - Now on maintenance Herceptin, Perjeta ([place]) q3weeks and Letrozole  - Tolerating Herceptin well. EF stable. Ok to continue   3. HTN - Blood pressure well controlled. Continue current regimen (losartan, amlodipine, and metop)   Earnie Larsson, AGACNP-BC  11:54 AM   Patient seen and examined with the above-signed Advanced Practice Provider and/or Housestaff. I personally reviewed laboratory data, imaging studies and relevant notes. I independently examined the patient and formulated the important aspects of the plan. I have edited the note to reflect any of my changes or salient points. I have personally discussed the plan with the patient and/or family.  Doing well. Denies CP or SOB. Tolerating Herceptin well.   Echo today EF 55-60% GLS -20.7  General:  Well appearing. No resp difficulty HEENT: normal Neck: supple. no JVD. Carotids 2+ bilat; no bruits. No lymphadenopathy or thryomegaly appreciated. Cor: PMI nondisplaced. Regular rate & rhythm. No rubs, gallops or murmurs. Lungs: clear Abdomen: soft, nontender, nondistended. No hepatosplenomegaly. No bruits or masses. Good bowel sounds. Extremities: no cyanosis, clubbing, rash, edema Neuro: alert & orientedx3, cranial nerves grossly intact. moves all 4 extremities w/o difficulty. Affect pleasant  Echo images reviewed personally. All parameters stable. Continue Herceptin. Follow-up with echo in 6 months.  Glori Bickers, MD  12:54 PM

## 2022-02-07 ENCOUNTER — Telehealth: Payer: Self-pay | Admitting: *Deleted

## 2022-02-07 NOTE — Telephone Encounter (Signed)
Call received from Maricela Bo [phone] (home) concerning "muffled message received from a caller to call [phone].  Needs to sign ROI that can be e-mailed if she supplies e-mail." Currently denies further questions or needs.  This nurse confirmed e-mail viewable for MyChart and demographics, created ROI for [place] form received.  E-mailed successfully to [email or web address].

## 2022-02-08 NOTE — Telephone Encounter (Signed)
VOYA LOA paperwork for Rhegan Heumann's spouse Ronasia Isola completed by this nurse at this time.  Folder placed in designated mail bin for collaborative pick up for provider review, signature, and return.

## 2022-02-10 ENCOUNTER — Inpatient Hospital Stay: Payer: Self-pay

## 2022-02-10 ENCOUNTER — Other Ambulatory Visit: Payer: Self-pay

## 2022-02-10 ENCOUNTER — Ambulatory Visit: Payer: Self-pay

## 2022-02-10 ENCOUNTER — Inpatient Hospital Stay: Payer: Self-pay | Attending: Hematology

## 2022-02-10 VITALS — BP 125/75 | HR 73 | Temp 97.5°F | Resp 18 | Ht 67.0 in | Wt 203.0 lb

## 2022-02-10 DIAGNOSIS — Z170 Estrogen receptor positive status [ER+]: Secondary | ICD-10-CM | POA: Insufficient documentation

## 2022-02-10 DIAGNOSIS — C7951 Secondary malignant neoplasm of bone: Secondary | ICD-10-CM | POA: Insufficient documentation

## 2022-02-10 DIAGNOSIS — C50411 Malignant neoplasm of upper-outer quadrant of right female breast: Secondary | ICD-10-CM | POA: Insufficient documentation

## 2022-02-10 DIAGNOSIS — Z5111 Encounter for antineoplastic chemotherapy: Secondary | ICD-10-CM | POA: Insufficient documentation

## 2022-02-10 LAB — CBC WITH DIFFERENTIAL (CANCER CENTER ONLY)
Abs Immature Granulocytes: 0.02 10*3/uL (ref 0.00–0.07)
Basophils Absolute: 0 10*3/uL (ref 0.0–0.1)
Basophils Relative: 0 %
Eosinophils Absolute: 0.2 10*3/uL (ref 0.0–0.5)
Eosinophils Relative: 3 %
HCT: 39.1 % (ref 36.0–46.0)
Hemoglobin: 12.9 g/dL (ref 12.0–15.0)
Immature Granulocytes: 0 %
Lymphocytes Relative: 47 %
Lymphs Abs: 2.6 10*3/uL (ref 0.7–4.0)
MCH: 29.6 pg (ref 26.0–34.0)
MCHC: 33 g/dL (ref 30.0–36.0)
MCV: 89.7 fL (ref 80.0–100.0)
Monocytes Absolute: 0.4 10*3/uL (ref 0.1–1.0)
Monocytes Relative: 8 %
Neutro Abs: 2.4 10*3/uL (ref 1.7–7.7)
Neutrophils Relative %: 42 %
Platelet Count: 287 10*3/uL (ref 150–400)
RBC: 4.36 MIL/uL (ref 3.87–5.11)
RDW: 13.9 % (ref 11.5–15.5)
WBC Count: 5.6 10*3/uL (ref 4.0–10.5)
nRBC: 0 % (ref 0.0–0.2)

## 2022-02-10 LAB — CMP (CANCER CENTER ONLY)
ALT: 8 U/L (ref 0–44)
AST: 14 U/L — ABNORMAL LOW (ref 15–41)
Albumin: 4.4 g/dL (ref 3.5–5.0)
Alkaline Phosphatase: 102 U/L (ref 38–126)
Anion gap: 8 (ref 5–15)
BUN: 14 mg/dL (ref 8–23)
CO2: 26 mmol/L (ref 22–32)
Calcium: 9.3 mg/dL (ref 8.9–10.3)
Chloride: 108 mmol/L (ref 98–111)
Creatinine: 0.79 mg/dL (ref 0.44–1.00)
GFR, Estimated: >60 mL/min (ref >60)
Glucose, Bld: 134 mg/dL — ABNORMAL HIGH (ref 70–99)
Potassium: 3.5 mmol/L (ref 3.5–5.1)
Sodium: 142 mmol/L (ref 135–145)
Total Bilirubin: 0.5 mg/dL (ref 0.3–1.2)
Total Protein: 7 g/dL (ref 6.5–8.1)

## 2022-02-10 MED ORDER — SODIUM CHLORIDE 0.9% FLUSH
10.0000 mL | Freq: Once | INTRAVENOUS | Status: AC
  Administered 2022-02-10: 10 mL

## 2022-02-10 MED ORDER — ACETAMINOPHEN 325 MG PO TABS
650.0000 mg | ORAL_TABLET | Freq: Once | ORAL | Status: AC
  Administered 2022-02-10: 650 mg via ORAL
  Filled 2022-02-10: qty 2

## 2022-02-10 MED ORDER — HEPARIN SOD (PORK) LOCK FLUSH 100 UNIT/ML IV SOLN
500.0000 [IU] | Freq: Once | INTRAVENOUS | Status: AC
  Administered 2022-02-10: 500 [IU]

## 2022-02-10 MED ORDER — DIPHENHYDRAMINE HCL 25 MG PO CAPS
50.0000 mg | ORAL_CAPSULE | Freq: Once | ORAL | Status: AC
  Administered 2022-02-10: 50 mg via ORAL
  Filled 2022-02-10: qty 2

## 2022-02-10 MED ORDER — PERTUZ-TRASTUZ-HYALURON-ZZXF 60-60-2000 MG-MG-U/ML CHEMO ~~LOC~~ SOLN
10.0000 mL | Freq: Once | SUBCUTANEOUS | Status: AC
  Administered 2022-02-10: 10 mL via SUBCUTANEOUS
  Filled 2022-02-10: qty 10

## 2022-02-10 NOTE — Patient Instructions (Signed)
[facility]  Discharge Instructions: Thank you for choosing [facility] to provide your oncology and hematology care.   If you have a lab appointment with the [facility], please go directly to the [facility] and check in at the registration area.   Wear comfortable clothing and clothing appropriate for easy access to any Portacath or PICC line.   We strive to give you quality time with your provider. You may need to reschedule your appointment if you arrive late (15 or more minutes).  Arriving late affects you and other patients whose appointments are after yours.  Also, if you miss three or more appointments without notifying the office, you may be dismissed from the clinic at the provider's discretion.      For prescription refill requests, have your pharmacy contact our office and allow 72 hours for refills to be completed.    Today you received the following chemotherapy and/or immunotherapy agents PHESGO      To help prevent nausea and vomiting after your treatment, we encourage you to take your nausea medication as directed.  BELOW ARE SYMPTOMS THAT SHOULD BE REPORTED IMMEDIATELY: *FEVER GREATER THAN 100.4 F (38 C) OR HIGHER *CHILLS OR SWEATING *NAUSEA AND VOMITING THAT IS NOT CONTROLLED WITH YOUR NAUSEA MEDICATION *UNUSUAL SHORTNESS OF BREATH *UNUSUAL BRUISING OR BLEEDING *URINARY PROBLEMS (pain or burning when urinating, or frequent urination) *BOWEL PROBLEMS (unusual diarrhea, constipation, pain near the anus) TENDERNESS IN MOUTH AND THROAT WITH OR WITHOUT PRESENCE OF ULCERS (sore throat, sores in mouth, or a toothache) UNUSUAL RASH, SWELLING OR PAIN  UNUSUAL VAGINAL DISCHARGE OR ITCHING   Items with * indicate a potential emergency and should be followed up as soon as possible or go to the Emergency Department if any problems should occur.  Please show the CHEMOTHERAPY ALERT CARD or IMMUNOTHERAPY ALERT CARD at check-in to the  Emergency Department and triage nurse.  Should you have questions after your visit or need to cancel or reschedule your appointment, please contact [facility]  Dept: [phone]  and follow the prompts.  Office hours are 8:00 a.m. to 4:30 p.m. Monday - Friday. Please note that voicemails left after 4:00 p.m. may not be returned until the following business day.  We are closed weekends and major holidays. You have access to a nurse at all times for urgent questions. Please call the main number to the clinic Dept: [phone] and follow the prompts.   For any non-urgent questions, you may also contact your provider using MyChart. We now offer e-Visits for anyone 33 and older to request care online for non-urgent symptoms. For details visit [email or web address].   Also download the MyChart app! Go to the app store, search "MyChart", open the app, select [facility], and log in with your MyChart username and password.  Masks are optional in the cancer centers. If you would like for your care team to wear a mask while they are taking care of you, please let them know. You may have one support person who is at least 61 years old accompany you for your appointments.

## 2022-02-10 NOTE — Patient Instructions (Signed)

## 2022-02-15 NOTE — Telephone Encounter (Signed)
Paperwork signed by provider 02/14/2022 returned to [place] per collaborative on 02/14/2022.  Second e-mail with attached ROI sent today.  Original mailed to address on file.  [place] [phone] No further instructions received, actions performed or required by this nurse.  Copy to H.I.M. bin designated for items to be scanned completes process.

## 2022-02-16 NOTE — Telephone Encounter (Addendum)
Message left asking return call to this nurse extension with confirmation of receipt of ROI form and return of form to [email or web address] as previously discussed upon receipt of form.

## 2022-03-03 ENCOUNTER — Inpatient Hospital Stay: Payer: Self-pay | Attending: Hematology

## 2022-03-03 VITALS — BP 122/75 | HR 76 | Temp 97.3°F | Resp 16 | Wt 202.6 lb

## 2022-03-03 DIAGNOSIS — C7951 Secondary malignant neoplasm of bone: Secondary | ICD-10-CM | POA: Insufficient documentation

## 2022-03-03 DIAGNOSIS — Z170 Estrogen receptor positive status [ER+]: Secondary | ICD-10-CM | POA: Insufficient documentation

## 2022-03-03 DIAGNOSIS — Z5111 Encounter for antineoplastic chemotherapy: Secondary | ICD-10-CM | POA: Insufficient documentation

## 2022-03-03 DIAGNOSIS — C50411 Malignant neoplasm of upper-outer quadrant of right female breast: Secondary | ICD-10-CM | POA: Insufficient documentation

## 2022-03-03 MED ORDER — PERTUZ-TRASTUZ-HYALURON-ZZXF 60-60-2000 MG-MG-U/ML CHEMO ~~LOC~~ SOLN
10.0000 mL | Freq: Once | SUBCUTANEOUS | Status: AC
  Administered 2022-03-03: 10 mL via SUBCUTANEOUS
  Filled 2022-03-03: qty 10

## 2022-03-03 MED ORDER — ACETAMINOPHEN 325 MG PO TABS
650.0000 mg | ORAL_TABLET | Freq: Once | ORAL | Status: AC
  Administered 2022-03-03: 650 mg via ORAL
  Filled 2022-03-03: qty 2

## 2022-03-03 MED ORDER — DIPHENHYDRAMINE HCL 25 MG PO CAPS
50.0000 mg | ORAL_CAPSULE | Freq: Once | ORAL | Status: AC
  Administered 2022-03-03: 50 mg via ORAL
  Filled 2022-03-03: qty 2

## 2022-03-03 NOTE — Patient Instructions (Signed)
Pertuzumab; Trastuzumab; Hyaluronidase Injection What is this medication? PERTUZUMAB; TRASTUZUMAB; HYALURONIDASE (per TOOZ ue mab; tras TOO zoo mab; hye al ur ON i dase) treats breast cancer. Pertuzumab and trastuzumab work by blocking a protein that causes cancer cells to grow and multiply. This helps to slow or stop the spread of cancer cells. Hyaluronidase works by increasing the absorption of other medications in the body to help them work better. It is a combination medication that contains two monoclonal antibodies. This medicine may be used for other purposes; ask your health care provider or pharmacist if you have questions. COMMON BRAND NAME(S): PHESGO What should I tell my care team before I take this medication? They need to know if you have any of these conditions: Heart failure High blood pressure Irregular heartbeat or rhythm Lung disease An unusual or allergic reaction to pertuzumab, trastuzumab, hyaluronidase, other medications, foods, dyes, or preservatives Pregnant or trying to get pregnant Breast-feeding How should I use this medication? This medication is injected under the skin. It is usually given by your care team in a hospital or clinic setting. It may also be given at home by your care team. Talk to your care team about the use of this medication in children. Special care may be needed. Overdosage: If you think you have taken too much of this medicine contact a poison control center or emergency room at once. NOTE: This medicine is only for you. Do not share this medicine with others. What if I miss a dose? Keep appointments for follow-up doses. It is important not to miss your dose. Call your care team if you are unable to keep an appointment. What may interact with this medication? Certain types of chemotherapy, such as daunorubicin, doxorubicin, epirubicin, idarubicin This list may not describe all possible interactions. Give your health care provider a list of all  the medicines, herbs, non-prescription drugs, or dietary supplements you use. Also tell them if you smoke, drink alcohol, or use illegal drugs. Some items may interact with your medicine. What should I watch for while using this medication? Your condition will be monitored carefully while you are receiving this medication. This medication may make you feel generally unwell. This is not uncommon as chemotherapy can affect healthy cells as well as cancer cells. Report any side effects. Continue your course of treatment even though you feel ill unless your care team tells you to stop. This medication may increase your risk of getting an infection. Call your care team for advice if you get a fever, chills, sore throat, or other symptoms of a cold or flu. Do not treat yourself. Try to avoid being around people who are sick. Avoid taking medications that contain aspirin, acetaminophen, ibuprofen, naproxen, or ketoprofen unless instructed by your care team. These medications may hide a fever. Talk to your care team if you may be pregnant. Serious birth defects can occur if you take this medication during pregnancy and for 7 months after the last dose. You will need a negative pregnancy test before starting this medication. Contraception is recommended while taking this medication and for 7 months after the last dose. Your care team can help you find the option that works for you. Do not breastfeed while taking this medication and for 7 months after the last dose. What side effects may I notice from receiving this medication? Side effects that you should report to your care team as soon as possible: Allergic reactions or angioedema--skin rash, itching or hives, swelling of the  face, eyes, lips, tongue, arms, or legs, trouble swallowing or breathing Dry cough, shortness of breath or trouble breathing Heart failure--shortness of breath, swelling of the ankles, feet, or hands, sudden weight gain, unusual weakness  or fatigue Infection--fever, chills, cough, or sore throat Infusion reactions--chest pain, shortness of breath or trouble breathing, feeling faint or lightheaded Side effects that usually do not require medical attention (report these to your care team if they continue or are bothersome): Diarrhea Hair loss Nausea Pain, redness, or irritation at injection site Pain, redness, or swelling with sores inside the mouth or throat Unusual weakness or fatigue This list may not describe all possible side effects. Call your doctor for medical advice about side effects. You may report side effects to FDA at 1-800-FDA-1088. Where should I keep my medication? This medication is given in a hospital or clinic. It will not be stored at home. NOTE: This sheet is a summary. It may not cover all possible information. If you have questions about this medicine, talk to your doctor, pharmacist, or health care provider.  2023 Elsevier/Gold Standard (2021-08-23 00:00:00)

## 2022-03-23 ENCOUNTER — Other Ambulatory Visit: Payer: Self-pay | Admitting: Medical Oncology

## 2022-03-23 DIAGNOSIS — C7951 Secondary malignant neoplasm of bone: Secondary | ICD-10-CM | POA: Insufficient documentation

## 2022-03-23 DIAGNOSIS — Z170 Estrogen receptor positive status [ER+]: Secondary | ICD-10-CM

## 2022-03-23 DIAGNOSIS — T451X5A Adverse effect of antineoplastic and immunosuppressive drugs, initial encounter: Secondary | ICD-10-CM | POA: Insufficient documentation

## 2022-03-23 NOTE — Assessment & Plan Note (Signed)
--  echo 09/22/20 showed decreased EF. She was started on losartan by Dr. Haroldine Laws on 10/21/20 and EF improved. -most recent echo on 01/26/22 was stable.

## 2022-03-23 NOTE — Assessment & Plan Note (Signed)
-  from breast cancer  --bone scan on 12/10/19 showed metastatic disease in left acetabulum, L3, and right 10th rib.  --Zometa started 06/18/20, but held after second dose due to loss of insurance.

## 2022-03-23 NOTE — Progress Notes (Signed)
Carolyn Stewart   [phone] [phone]   Clinic Follow up Note   Patient Care Team: Carolyn Hy, PA-C as PCP - General (Physician Assistant) Carolyn Klein, MD as Consulting Physician (General Surgery) Carolyn Merle, MD as Consulting Physician (Hematology) Carolyn Rudd, MD as Consulting Physician (Radiation Oncology) Carolyn Kaufmann, RN as Oncology Nurse Navigator Carolyn Germany, RN as Oncology Nurse Navigator  Date of Service:  03/24/2022  CHIEF COMPLAINT: f/u of metastatic breast cancer   CURRENT THERAPY:  -Maintenance Herceptin and Perjeta (Phesgo) q3weeks starting 06/18/20, held 09/22/20-11/24/20 due to low EF -Letrozole 2.9m daily starting 06/18/20    ASSESSMENT:  Carolyn Whiteheadis a 61y.o. female with   Malignant neoplasm of upper-outer quadrant of right breast in female, estrogen receptor positive ([place] -stage IV [id number], with bone and possible liver mets,  ER+/PR+/HER2+, Grade II-III   --diagnosed in 11/2019 with large right breast mass measuring up to 11cm and right lymphadenopathy. 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 Left axillary node biopsy was benign.  -01/01/20 Liver biopsy was negative but 01/12/20 Bone Biopsy was positive for metastatic carcinoma from primary breast cancer. ER/PR/HER2 were all negative, possibly related to decalcification. -She received THP 01/03/20 - 2/3/2 -on maintenance HP Phesgo injection every 3 weeks -last staging scan in 08/2020, not repeated due to lack of insurance and she is clinically doing well   Metastasis to bone (HUlysses -from breast cancer  --bone scan on 12/10/19 showed metastatic disease in left acetabulum, L3, and right 10th rib.  --Zometa started 06/18/20, but held after second dose due to loss of insurance.   Chemotherapy induced cardiomyopathy (HP[place] --echo 09/22/20 showed decreased EF. She was started on losartan by Dr. BHaroldine Lawson 10/21/20  and EF improved. -most recent echo on 01/26/22 was stable.   Fatigue -slight worse lately but still functions well -no signs of CHF or high concern for cancer progression -I encourage her to exercise    PLAN: -she prefers [facility] for injection appointment on days she doesn't see me  -I refill Potassium -Phesgo Injection DB in 3 wks -f[facility] and [facility] Scan a few days before.  SUMMARY OF ONCOLOGIC HISTORY: Oncology History Overview Note  Cancer Staging Malignant neoplasm of upper-outer quadrant of right breast in female, estrogen receptor positive ([place] Staging form: Breast, AJCC 8th Edition - Clinical stage from 11/25/2019: Stage IIA (cT3, cN1, cM0, G2, ER+, PR+, HER2+) - Signed by FTruitt Merle MD on 12/03/2019    Malignant neoplasm of upper-outer quadrant of right breast in female, estrogen receptor positive (HE[place]  11/13/2019 Mammogram    Diagnostic Mammogram 11/13/19  IMPRESSION: -Highly suspicious mass and calcifications in the right breast centered between 9 and 12 o'clock spanning up to 11 cm.  -Multiple masses in the right axilla. One of the masses contains calcifications, denoted as mass number 2 measuring 9 x 11 by 10 mm. Several of these masses do not appear to represent definitive lymph nodes. Others may be small but abnormal appearing lymph nodes. Taken in total, a cluster of masses in the right axilla spans up to 3.7 cm.  -There are fibrocystic changes on the left. There are also some solid-appearing masses. A solid appearing mass at 10:30, 6 cm from the nipple measures 9 x 6 x 6 mm. An adjacent solid mass at 10 o'clock, 6 cm from the nipple measures 8 x 6 by  5 mm. Another solid-appearing mass at 11 o'clock, 1 cm from the nipple measures 5 x 4 by 5 mm. Fibrocystic changes are seen at 10 o'clock, 12 o'clock, and 4 o'clock. Another solid-appearing mass seen at 5 o'clock, 4 cm from the nipple measuring 6 x 3 x 4 mm. There is a single  abnormal node in the left axilla with a cortex measuring 5 mm and restriction of the fatty hilum.   11/25/2019 Initial Biopsy   Diagnosis 1. Breast, right, needle core biopsy, upper outer - INVASIVE MAMMARY CARCINOMA - MAMMARY CARCINOMA IN SITU WITH CALCIFICATIONS AND NECROSIS - SEE COMMENT 2. Lymph node, needle/core biopsy, right axilla - INVASIVE MAMMARY CARCINOMA - SEE COMMENT 3. Lymph node, needle/core biopsy, left axilla - BENIGN LYMPH NODE - NO CARCINOMA IDENTIFIED Microscopic Comment 1. The biopsy material shows an infiltrative proliferation of cells arranged linearly and in small clusters. Based on the biopsy, the carcinoma appears Nottingham grade 2 of 3 and measures 1.2 cm in greatest linear extent. E-cadherin and prognostic markers (ER/PR/ki-67/HER2)are pending and will be reported in an addendum. Dr. Tresa Moore reviewed the case and agrees with the above diagnosis. These results were called to [facility] on November 26, 2019. 2. Definitive nodal tissue is not identified. This biopsy has a slightly different architecture morphology than what is seen in part 1. The carcinoma measures 0.7 cm in greatest dimension. E-cadherin and prognostic markers (ER, PR, Ki-67 and HER-2) are pending and will be reported in an addendum.   11/25/2019 Receptors her2   1. PROGNOSTIC INDICATORS Results: IMMUNOHISTOCHEMICAL AND MORPHOMETRIC ANALYSIS PERFORMED MANUALLY The tumor cells are POSITIVE for Her2 (3+). Estrogen Receptor: 90%, POSITIVE, STRONG STAINING INTENSITY Progesterone Receptor: 20%, POSITIVE, STRONG STAINING INTENSITY Proliferation Marker Ki67: 30%  2. PROGNOSTIC INDICATORS Results: IMMUNOHISTOCHEMICAL AND MORPHOMETRIC ANALYSIS PERFORMED MANUALLY The tumor cells are POSITIVE for Her2 (3+). Estrogen Receptor: 90%, POSITIVE, STRONG STAINING INTENSITY Progesterone Receptor: 25%, POSITIVE, STRONG STAINING INTENSITY Proliferation Marker Ki67: 30%   11/25/2019 Cancer  Staging   Staging form: Breast, AJCC 8th Edition - Clinical stage from 11/25/2019: Stage IIA (cT3, cN1, cM0, G2, ER+, PR+, HER2+) - Signed by Carolyn Merle, MD on 12/03/2019   12/01/2019 Initial Diagnosis   Malignant neoplasm of upper-outer quadrant of right breast in female, estrogen receptor positive ([place])   12/10/2019 Imaging   Bone Scan  IMPRESSION: Findings concerning for bony metastatic disease in the left acetabulum region, L3 vertebral body region, and anterolateral right tenth rib.   Increased uptake in several large joints likely is of arthropathic etiology.   12/11/2019 Imaging   CT CAP w contrast  IMPRESSION: Prominent glandular tissue in the central right breast with nipple retraction, likely corresponding to the patient's known right breast neoplasm.   Prominent right axillary nodes measuring up to 8 mm short axis, suspicious for nodal metastases in this clinical context.   3 mm subpleural pulmonary nodule in the right lower lobe, likely benign. However, follow-up CT chest is suggested in 3-6 months.   Multiple hepatic lesions, including a dominant 2.4 cm lesion in segment 6, which is considered indeterminate. Metastasis is not excluded. Consider MRI abdomen with/without contrast for further characterization.   11 mm sclerotic lesion along the left posterior aspect of the T7 vertebral body raises concern for isolated osseous metastasis. Correlate with priors if available. Benign vertebral hemangioma at T11.   12/12/2019 Breast MRI   IMPRESSION: 1. The biopsy proven malignancy in the right breast involves all 4 quadrants and spans  up to 9 cm by MRI. There is enhancement within the skin at the level of the nipple.   2. Multiple matted masses are seen in the right axilla, either abnormal metastatic lymph nodes or a combination of masses and metastatic lymph nodes. One of these has been previously biopsied showing invasive mammary carcinoma without definitive nodal  tissue.   3.  There is a 1.5 cm Rotter's lymph node on the right.   4. There is markedly abnormal enhancement and edema in the right pectoralis major muscle spanning 6-7 cm, suspicious for metastatic disease.   5. There is diffuse nodular enhancement throughout the left breast, which may correspond with the solid-appearing masses seen on ultrasound. These all have a similar appearance on MRI.   6.  No findings of left axillary lymphadenopathy.   12/15/2019 Procedure   PAC placed    12/24/2019 Imaging   MRI abdomen  IMPRESSION: 1. Multifocal enhancing bone lesions are noted within the lumbar spine, and bony pelvis compatible with metastatic disease. 2. Four, small peripherally enhancing cystic lesions are noted within the liver worrisome for metastatic disease. 3. 2 enhancing liver lesions are also noted with signal and enhancement characteristics consistent with benign liver hemangioma. 4. Well-circumscribed, enhancing T2 and T1 hypointense structure within the right adnexal region is indeterminate. It is unclear whether not this is arising from the right ovary, broad ligament or right side of uterine fundus. Differential considerations include fibrothecoma, versus pedunculated uterine leiomyoma. Metastatic disease is considered less favored. Attention on follow-up imaging is recommended.     01/03/2020 - 05/27/2020 Chemotherapy   First line THP q3weeks starting 01/03/20-05/27/20   01/05/2020 Imaging   US Abdomen  IMPRESSION: 1. Suspect hematoma in the medial right lobe of the liver in the area of previous biopsy. Note that recent MR does not show lesion in this area. This area has ill-defined margins and measures 3.9 x 4.0 x 3.5 cm. No perihepatic fluid.   2. Probable hemangiomas elsewhere in the right lobe of the liver. Note that several smaller lesions seen on recent MR are not appreciable by ultrasound.   3.  Study otherwise unremarkable.   01/12/2020 Pathology Results    FINAL MICROSCOPIC DIAGNOSIS:   A. BONE, SCLEROTIC LESION, LEFT ANTERIOR ILIAC SPINE, BIOPSY:  - Metastatic carcinoma, consistent with patient's clinical history of  primary breast carcinoma  - See comment   COMMENT:  Immunohistochemical stains show that the tumor cells are positive for  CK7 and GATA3; and negative for CK20, consistent with above  interpretation.   PROGNOSTIC INDICATOR RESULTS:  The tumor cells are NEGATIVE for Her2 (0); see comment  Estrogen Receptor: NEGATIVE; see comment  Progesterone Receptor: NEGATIVE; see comment    03/23/2020 Imaging   CT CAP IMPRESSION: 1. Right lower lobe perifissural nodule is slightly more conspicuous on today's study. Close attention on follow-up recommended. 2. Interval progression of segment IV liver lesion, concerning for metastatic progression. The remaining visualized liver lesions are stable to smaller in the interval. 3. Interval evolution in appearance of thoracolumbar spinal lesions and left iliac crest lesion, potentially reflecting response to therapy/healing. No definite new osseous lesions on today's study. 4. New small fluid collection right cul-de-sac. 5. Probable fibroid change in the uterus including exophytic right fundal lesion. 6. Aortic Atherosclerosis (ICD10-I70.0).   03/31/2020 Imaging   MR ABD IMPRESSION: 1. Enlarging hepatic lesion in hepatic subsegment IV suspicious for hepatic metastasis. The it is uncertain whether the change in the short interval is due to  technical factors with different modalities or true enlargement. 2. Lesion in the posterior RIGHT hepatic lobe with imaging features that are atypical for hemangioma but stability and signs of enhancement on later phase raising this question. Another more classic appearing may angioma seen inferior to this location in hepatic subsegment VI. Attention on follow-up. 3. Heterogeneous enhancement throughout the spine particularly at L3 as exhibited on  the prior study with very patchy marrow signal remains suspicious for bony metastatic lesions in this patient with known bone metastases.   04/01/2020 Imaging   Bone scan IMPRESSION: Good response to therapy with resolution of sites of uptake seen previously at the cervical spine and posterior RIGHT tenth rib as well as a diminished degree of uptake at remaining previously identified osseous metastatic foci.   04/28/2020 PET scan   IMPRESSION: 1. No hypermetabolic lesions to suggest active metastatic disease involving the liver or osseous structures. 2. Stable small low-attenuation liver lesions, likely treated disease.   06/18/2020 -  Chemotherapy   Given her excellent response, I switched to maintenance therapy with Herceptin, Perjeta (Phesgo) q3weeks and Letrozole.   06/18/2020 -  Chemotherapy   Zometa starting 06/18/20   06/18/2020 -  Anti-estrogen oral therapy   Letrozole 2.58m once dialy.    06/18/2020 -  Chemotherapy   Patient is on Treatment Plan : BREAST Trastuzumab + Pertuzumab q21d     09/03/2020 Imaging   Bone Scan IMPRESSION: 1. Significant interval improvement compared to previous studies. Only mild uptake remains in the pelvis as above. There has been significant interval improvement over time.  CT C/A/P IMPRESSION: 1. Hypodense liver lesions are slightly decreased in size compared to prior examination, consistent with treatment response of hepatic metastatic disease. 2. Unchanged sclerotic lesion of the anterior left iliac crest. Unchanged lytic superior endplate deformity of L3, which remains equivocal for metastatic lesion versus mechanical Schmorl deformity. No CT evidence of new osseous metastatic disease. 3. No evidence of new metastatic disease in the chest, abdomen, or pelvis. 4. Coronary artery disease.      INTERVAL HISTORY:  KTaniah Reineckeis here for a follow up of metastatic breast cancer  She was last seen by me on 01/20/2022 She presents to  the clinic alone.Pt reports of feeling fatigue, overall is doing well.She's working and states she needs to be more active.She is  eating well.  All other systems were reviewed with the patient and are negative.  MEDICAL HISTORY:  Past Medical History:  Diagnosis Date   Cancer (HPope 2021   Hypertension     SURGICAL HISTORY: Past Surgical History:  Procedure Laterality Date   left parotid tumor removal   2006   PORTACATH PLACEMENT Left 12/15/2019   Procedure: INSERTION PORT-A-CATH WITH ULTRASOUND GUIDANCE;  Surgeon: BStark Klein MD;  [facility] [facility]  Service: General;  Laterality: Left;   TONSILLECTOMY      I have reviewed the social history and family history with the patient and they are unchanged from previous note.  ALLERGIES:  has No Known Allergies.  MEDICATIONS:  Current Outpatient Medications  Medication Sig Dispense Refill   acetaminophen (TYLENOL) 500 MG tablet Take 1,000 mg by mouth every 6 (six) hours as needed for moderate pain or headache.     amLODipine (NORVASC) 10 MG tablet Take 2 tablets (20 mg total) by mouth daily. 60 tablet 11   Cholecalciferol (VITAMIN D3) 50 MCG (2000 UT) TABS Take 2,000 Units by mouth daily.      gabapentin (NEURONTIN) 100 MG capsule  Take 1-2 capsules (100-200 mg total) by mouth at bedtime. 60 capsule 2   letrozole (FEMARA) 2.5 MG tablet Take 1 tablet (2.5 mg total) by mouth daily. 90 tablet 1   lidocaine-prilocaine (EMLA) cream Apply 1 application topically as needed. 30 g 1   losartan (COZAAR) 100 MG tablet TAKE ONE TABLET BY MOUTH DAILY 90 tablet 3   metoprolol succinate (TOPROL-XL) 100 MG 24 hr tablet Take 1 tablet (100 mg total) by mouth daily. 30 tablet 6   omeprazole (PRILOSEC OTC) 20 MG tablet Take 20 mg by mouth daily.     potassium chloride (KLOR-CON M) 10 MEQ tablet Take 1 tablet (10 mEq total) by mouth daily. Take 86mq daily for 1 week then take 233m daily for 30 days. 90 tablet 1   No current facility-administered  medications for this visit.    PHYSICAL EXAMINATION: ECOG PERFORMANCE STATUS: 1 - Symptomatic but completely ambulatory  Vitals:   03/24/22 0956  BP: 135/77  Pulse: 83  Resp: 18  Temp: 98.1 F (36.7 C)  SpO2: 99%   Wt Readings from Last 3 Encounters:  03/24/22 205 lb 9.6 oz (93.3 kg)  03/03/22 202 lb 9.6 oz (91.9 kg)  02/10/22 203 lb (92.1 kg)    GENERAL:alert, no distress and comfortable SKIN: skin color, texture, turgor are normal, no rashes or significant lesions Right Breast normal no tenderness not enlarge. EYES: normal, Conjunctiva are pink and non-injected, sclera clear NECK: supple, thyroid normal size, non-tender, without nodularity LYMPH:  no palpable lymphadenopathy in the cervical, axillary  LUNGS: clear to auscultation and percussion with normal breathing effort HEART: regular rate & rhythm and no murmurs and no lower extremity edema (-) ABDOMEN:abdomen soft, non-tender and normal bowel sounds Musculoskeletal:no cyanosis of digits and no clubbing  NEURO: alert & oriented x 3 with fluent speech, no focal motor/sensory deficits  LABORATORY DATA:  I have reviewed the data as listed    Latest Ref Rng & Units 03/24/2022    9:25 AM 02/10/2022    9:20 AM 01/20/2022    9:40 AM  CBC  WBC 4.0 - 10.5 K/uL 8.6  5.6  9.2   Hemoglobin 12.0 - 15.0 g/dL 12.7  12.9  13.1   Hematocrit 36.0 - 46.0 % 37.8  39.1  39.2   Platelets 150 - 400 K/uL 303  287  309         Latest Ref Rng & Units 03/24/2022    9:25 AM 02/10/2022    9:20 AM 01/26/2022   12:07 PM  CMP  Glucose 70 - 99 mg/dL 123  134  101   BUN 8 - 23 mg/dL _0 Creatinine 0.44 - 1.00 mg/dL 0.76  0.79  0.77   Sodium 135 - 145 mmol/L 140  142  141   Potassium 3.5 - 5.1 mmol/L 3.3  3.5  3.6   Chloride 98 - 111 mmol/L 109  108  110   CO2 22 - 32 mmol/L _1 Calcium 8.9 - 10.3 mg/dL 9.2  9.3  8.9   Total Protein 6.5 - 8.1 g/dL 7.2  7.0    Total Bilirubin 0.3 - 1.2 mg/dL 0.4  0.5    Alkaline Phos 38  - 126 U/L 106  102    AST 15 - 41 U/L 14  14    ALT 0 - 44 U/L 12  8        RADIOGRAPHIC STUDIES: I have  personally reviewed the radiological images as listed and agreed with the findings in the report. No results found.    Orders Placed This Encounter  Procedures   CT CHEST ABDOMEN PELVIS W CONTRAST    Standing Status:   Future    Standing Expiration Date:   03/25/2023    Order Specific Question:   Preferred imaging location?    Answer:   [facility]    Order Specific Question:   Release to patient    Answer:   Immediate    Order Specific Question:   Is Oral Contrast requested for this exam?    Answer:   Yes, Per Radiology protocol   NM Bone Scan Whole Body    Standing Status:   Future    Standing Expiration Date:   03/24/2023    Order Specific Question:   If indicated for the ordered procedure, I authorize the administration of a radiopharmaceutical per Radiology protocol    Answer:   Yes    Order Specific Question:   Preferred imaging location?    Answer:   [facility]   All questions were answered. The patient knows to call the clinic with any problems, questions or concerns. No barriers to learning was detected. The total time spent in the appointment was 30 minutes.     Carolyn Merle, MD 03/24/2022   Felicity Coyer, CMA, am acting as scribe for Carolyn Merle, MD.   I have reviewed the above documentation for accuracy and completeness, and I agree with the above.

## 2022-03-23 NOTE — Assessment & Plan Note (Signed)
-  stage IV [phone]), with bone and possible liver mets,  ER+/PR+/HER2+, Grade II-III   --diagnosed in 11/2019 with large right breast mass measuring up to 11cm and right lymphadenopathy. 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 Left axillary node biopsy was benign.  -01/01/20 Liver biopsy was negative but 01/12/20 Bone Biopsy was positive for metastatic carcinoma from primary breast cancer. ER/PR/HER2 were all negative, possibly related to decalcification. -She received THP 01/03/20 - 2/3/2 -on maintenance HP Phesgo injection every 3 weeks -last staging scan in 08/2020, not repeated due to lack of insurance and she is clinically doing well

## 2022-03-24 ENCOUNTER — Inpatient Hospital Stay: Payer: Commercial Managed Care - PPO

## 2022-03-24 ENCOUNTER — Inpatient Hospital Stay: Payer: Commercial Managed Care - PPO | Attending: Hematology

## 2022-03-24 ENCOUNTER — Encounter: Payer: Self-pay | Admitting: Hematology

## 2022-03-24 ENCOUNTER — Inpatient Hospital Stay (HOSPITAL_BASED_OUTPATIENT_CLINIC_OR_DEPARTMENT_OTHER): Payer: Commercial Managed Care - PPO | Admitting: Hematology

## 2022-03-24 VITALS — BP 118/76 | HR 83 | Temp 97.5°F | Resp 18

## 2022-03-24 VITALS — BP 135/77 | HR 83 | Temp 98.1°F | Resp 18 | Wt 205.6 lb

## 2022-03-24 DIAGNOSIS — C7951 Secondary malignant neoplasm of bone: Secondary | ICD-10-CM | POA: Insufficient documentation

## 2022-03-24 DIAGNOSIS — Z5111 Encounter for antineoplastic chemotherapy: Secondary | ICD-10-CM | POA: Insufficient documentation

## 2022-03-24 DIAGNOSIS — C50411 Malignant neoplasm of upper-outer quadrant of right female breast: Secondary | ICD-10-CM | POA: Diagnosis not present

## 2022-03-24 DIAGNOSIS — I10 Essential (primary) hypertension: Secondary | ICD-10-CM | POA: Insufficient documentation

## 2022-03-24 DIAGNOSIS — Z170 Estrogen receptor positive status [ER+]: Secondary | ICD-10-CM | POA: Insufficient documentation

## 2022-03-24 DIAGNOSIS — T451X5A Adverse effect of antineoplastic and immunosuppressive drugs, initial encounter: Secondary | ICD-10-CM

## 2022-03-24 DIAGNOSIS — Z79811 Long term (current) use of aromatase inhibitors: Secondary | ICD-10-CM | POA: Insufficient documentation

## 2022-03-24 DIAGNOSIS — R5383 Other fatigue: Secondary | ICD-10-CM | POA: Diagnosis not present

## 2022-03-24 DIAGNOSIS — I427 Cardiomyopathy due to drug and external agent: Secondary | ICD-10-CM

## 2022-03-24 LAB — CBC WITH DIFFERENTIAL (CANCER CENTER ONLY)
Abs Immature Granulocytes: 0.02 10*3/uL (ref 0.00–0.07)
Basophils Absolute: 0 10*3/uL (ref 0.0–0.1)
Basophils Relative: 1 %
Eosinophils Absolute: 0.2 10*3/uL (ref 0.0–0.5)
Eosinophils Relative: 2 %
HCT: 37.8 % (ref 36.0–46.0)
Hemoglobin: 12.7 g/dL (ref 12.0–15.0)
Immature Granulocytes: 0 %
Lymphocytes Relative: 34 %
Lymphs Abs: 2.9 10*3/uL (ref 0.7–4.0)
MCH: 30.3 pg (ref 26.0–34.0)
MCHC: 33.6 g/dL (ref 30.0–36.0)
MCV: 90.2 fL (ref 80.0–100.0)
Monocytes Absolute: 0.9 10*3/uL (ref 0.1–1.0)
Monocytes Relative: 11 %
Neutro Abs: 4.5 10*3/uL (ref 1.7–7.7)
Neutrophils Relative %: 52 %
Platelet Count: 303 10*3/uL (ref 150–400)
RBC: 4.19 MIL/uL (ref 3.87–5.11)
RDW: 14.2 % (ref 11.5–15.5)
WBC Count: 8.6 10*3/uL (ref 4.0–10.5)
nRBC: 0 % (ref 0.0–0.2)

## 2022-03-24 LAB — CMP (CANCER CENTER ONLY)
ALT: 12 U/L (ref 0–44)
AST: 14 U/L — ABNORMAL LOW (ref 15–41)
Albumin: 4.2 g/dL (ref 3.5–5.0)
Alkaline Phosphatase: 106 U/L (ref 38–126)
Anion gap: 5 (ref 5–15)
BUN: 12 mg/dL (ref 8–23)
CO2: 26 mmol/L (ref 22–32)
Calcium: 9.2 mg/dL (ref 8.9–10.3)
Chloride: 109 mmol/L (ref 98–111)
Creatinine: 0.76 mg/dL (ref 0.44–1.00)
GFR, Estimated: >60 mL/min (ref >60)
Glucose, Bld: 123 mg/dL — ABNORMAL HIGH (ref 70–99)
Potassium: 3.3 mmol/L — ABNORMAL LOW (ref 3.5–5.1)
Sodium: 140 mmol/L (ref 135–145)
Total Bilirubin: 0.4 mg/dL (ref 0.3–1.2)
Total Protein: 7.2 g/dL (ref 6.5–8.1)

## 2022-03-24 MED ORDER — DIPHENHYDRAMINE HCL 25 MG PO CAPS
50.0000 mg | ORAL_CAPSULE | Freq: Once | ORAL | Status: AC
  Administered 2022-03-24: 50 mg via ORAL
  Filled 2022-03-24: qty 2

## 2022-03-24 MED ORDER — HEPARIN SOD (PORK) LOCK FLUSH 100 UNIT/ML IV SOLN
500.0000 [IU] | Freq: Once | INTRAVENOUS | Status: AC
  Administered 2022-03-24: 500 [IU]

## 2022-03-24 MED ORDER — HEPARIN SOD (PORK) LOCK FLUSH 100 UNIT/ML IV SOLN: 500.0000 [IU] | Freq: Once | INTRAVENOUS | Status: DC | PRN

## 2022-03-24 MED ORDER — SODIUM CHLORIDE 0.9% FLUSH: 10.0000 mL | INTRAVENOUS | Status: DC | PRN

## 2022-03-24 MED ORDER — PERTUZ-TRASTUZ-HYALURON-ZZXF 60-60-2000 MG-MG-U/ML CHEMO ~~LOC~~ SOLN
10.0000 mL | Freq: Once | SUBCUTANEOUS | Status: AC
  Administered 2022-03-24: 10 mL via SUBCUTANEOUS
  Filled 2022-03-24: qty 10

## 2022-03-24 MED ORDER — ACETAMINOPHEN 325 MG PO TABS
650.0000 mg | ORAL_TABLET | Freq: Once | ORAL | Status: AC
  Administered 2022-03-24: 650 mg via ORAL
  Filled 2022-03-24: qty 2

## 2022-03-24 MED ORDER — SODIUM CHLORIDE 0.9 % IV SOLN: Freq: Once | INTRAVENOUS | Status: DC

## 2022-03-24 MED ORDER — SODIUM CHLORIDE 0.9% FLUSH
10.0000 mL | Freq: Once | INTRAVENOUS | Status: AC
  Administered 2022-03-24: 10 mL

## 2022-03-24 MED ORDER — POTASSIUM CHLORIDE CRYS ER 10 MEQ PO TBCR: 10.0000 meq | EXTENDED_RELEASE_TABLET | Freq: Every day | ORAL | 1 refills | Status: DC

## 2022-03-24 NOTE — Patient Instructions (Signed)
[facility]  Discharge Instructions: Thank you for choosing [facility] to provide your oncology and hematology care.   If you have a lab appointment with the [facility], please go directly to the [facility] and check in at the registration area.   Wear comfortable clothing and clothing appropriate for easy access to any Portacath or PICC line.   We strive to give you quality time with your provider. You may need to reschedule your appointment if you arrive late (15 or more minutes).  Arriving late affects you and other patients whose appointments are after yours.  Also, if you miss three or more appointments without notifying the office, you may be dismissed from the clinic at the provider's discretion.      For prescription refill requests, have your pharmacy contact our office and allow 72 hours for refills to be completed.    Today you received the following chemotherapy and/or immunotherapy agents: Phesgo.       To help prevent nausea and vomiting after your treatment, we encourage you to take your nausea medication as directed.  BELOW ARE SYMPTOMS THAT SHOULD BE REPORTED IMMEDIATELY: *FEVER GREATER THAN 100.4 F (38 C) OR HIGHER *CHILLS OR SWEATING *NAUSEA AND VOMITING THAT IS NOT CONTROLLED WITH YOUR NAUSEA MEDICATION *UNUSUAL SHORTNESS OF BREATH *UNUSUAL BRUISING OR BLEEDING *URINARY PROBLEMS (pain or burning when urinating, or frequent urination) *BOWEL PROBLEMS (unusual diarrhea, constipation, pain near the anus) TENDERNESS IN MOUTH AND THROAT WITH OR WITHOUT PRESENCE OF ULCERS (sore throat, sores in mouth, or a toothache) UNUSUAL RASH, SWELLING OR PAIN  UNUSUAL VAGINAL DISCHARGE OR ITCHING   Items with * indicate a potential emergency and should be followed up as soon as possible or go to the Emergency Department if any problems should occur.  Please show the CHEMOTHERAPY ALERT CARD or IMMUNOTHERAPY ALERT CARD at check-in to  the Emergency Department and triage nurse.  Should you have questions after your visit or need to cancel or reschedule your appointment, please contact [facility]  Dept: [phone]  and follow the prompts.  Office hours are 8:00 a.m. to 4:30 p.m. Monday - Friday. Please note that voicemails left after 4:00 p.m. may not be returned until the following business day.  We are closed weekends and major holidays. You have access to a nurse at all times for urgent questions. Please call the main number to the clinic Dept: [phone] and follow the prompts.   For any non-urgent questions, you may also contact your provider using MyChart. We now offer e-Visits for anyone 29 and older to request care online for non-urgent symptoms. For details visit [email or web address].   Also download the MyChart app! Go to the app store, search "MyChart", open the app, select , and log in with your MyChart username and password.  Masks are optional in the cancer centers. If you would like for your care team to wear a mask while they are taking care of you, please let them know. You may have one support person who is at least 61 years old accompany you for your appointments.

## 2022-03-25 ENCOUNTER — Other Ambulatory Visit: Payer: Self-pay

## 2022-03-27 ENCOUNTER — Other Ambulatory Visit: Payer: Self-pay

## 2022-03-28 LAB — CANCER ANTIGEN 27.29: CA 27.29: 39.4 U/mL — ABNORMAL HIGH (ref 0.0–38.6)

## 2022-03-29 ENCOUNTER — Other Ambulatory Visit: Payer: Self-pay

## 2022-04-14 ENCOUNTER — Inpatient Hospital Stay: Payer: Commercial Managed Care - PPO

## 2022-04-14 VITALS — BP 127/78 | HR 87 | Temp 98.4°F | Resp 18

## 2022-04-14 DIAGNOSIS — Z170 Estrogen receptor positive status [ER+]: Secondary | ICD-10-CM

## 2022-04-14 DIAGNOSIS — Z5111 Encounter for antineoplastic chemotherapy: Secondary | ICD-10-CM | POA: Diagnosis not present

## 2022-04-14 MED ORDER — ACETAMINOPHEN 325 MG PO TABS
650.0000 mg | ORAL_TABLET | Freq: Once | ORAL | Status: AC
  Administered 2022-04-14: 650 mg via ORAL
  Filled 2022-04-14: qty 2

## 2022-04-14 MED ORDER — PERTUZ-TRASTUZ-HYALURON-ZZXF 60-60-2000 MG-MG-U/ML CHEMO ~~LOC~~ SOLN
10.0000 mL | Freq: Once | SUBCUTANEOUS | Status: AC
  Administered 2022-04-14: 10 mL via SUBCUTANEOUS
  Filled 2022-04-14: qty 10

## 2022-04-14 MED ORDER — DIPHENHYDRAMINE HCL 25 MG PO CAPS
50.0000 mg | ORAL_CAPSULE | Freq: Once | ORAL | Status: AC
  Administered 2022-04-14: 50 mg via ORAL
  Filled 2022-04-14: qty 2

## 2022-04-14 MED ORDER — SODIUM CHLORIDE 0.9 % IV SOLN: Freq: Once | INTRAVENOUS | Status: DC

## 2022-04-14 NOTE — Patient Instructions (Signed)
[facility]  Discharge Instructions: Thank you for choosing [facility] to provide your oncology and hematology care.   If you have a lab appointment with the [facility], please go directly to the [facility] and check in at the registration area.   Wear comfortable clothing and clothing appropriate for easy access to any Portacath or PICC line.   We strive to give you quality time with your provider. You may need to reschedule your appointment if you arrive late (15 or more minutes).  Arriving late affects you and other patients whose appointments are after yours.  Also, if you miss three or more appointments without notifying the office, you may be dismissed from the clinic at the provider's discretion.      For prescription refill requests, have your pharmacy contact our office and allow 72 hours for refills to be completed.    Today you received the following chemotherapy and/or immunotherapy agents: Phesgo.       To help prevent nausea and vomiting after your treatment, we encourage you to take your nausea medication as directed.  BELOW ARE SYMPTOMS THAT SHOULD BE REPORTED IMMEDIATELY: *FEVER GREATER THAN 100.4 F (38 C) OR HIGHER *CHILLS OR SWEATING *NAUSEA AND VOMITING THAT IS NOT CONTROLLED WITH YOUR NAUSEA MEDICATION *UNUSUAL SHORTNESS OF BREATH *UNUSUAL BRUISING OR BLEEDING *URINARY PROBLEMS (pain or burning when urinating, or frequent urination) *BOWEL PROBLEMS (unusual diarrhea, constipation, pain near the anus) TENDERNESS IN MOUTH AND THROAT WITH OR WITHOUT PRESENCE OF ULCERS (sore throat, sores in mouth, or a toothache) UNUSUAL RASH, SWELLING OR PAIN  UNUSUAL VAGINAL DISCHARGE OR ITCHING   Items with * indicate a potential emergency and should be followed up as soon as possible or go to the Emergency Department if any problems should occur.  Please show the CHEMOTHERAPY ALERT CARD or IMMUNOTHERAPY ALERT CARD at check-in to  the Emergency Department and triage nurse.  Should you have questions after your visit or need to cancel or reschedule your appointment, please contact [facility]  Dept: [phone]  and follow the prompts.  Office hours are 8:00 a.m. to 4:30 p.m. Monday - Friday. Please note that voicemails left after 4:00 p.m. may not be returned until the following business day.  We are closed weekends and major holidays. You have access to a nurse at all times for urgent questions. Please call the main number to the clinic Dept: [phone] and follow the prompts.   For any non-urgent questions, you may also contact your provider using MyChart. We now offer e-Visits for anyone 22 and older to request care online for non-urgent symptoms. For details visit [email or web address].   Also download the MyChart app! Go to the app store, search "MyChart", open the app, select [facility], and log in with your MyChart username and password.  Masks are optional in the cancer centers. If you would like for your care team to wear a mask while they are taking care of you, please let them know. You may have one support person who is at least 61 years old accompany you for your appointments.

## 2022-04-22 ENCOUNTER — Other Ambulatory Visit: Payer: Self-pay | Admitting: Hematology

## 2022-05-03 ENCOUNTER — Encounter: Payer: Self-pay | Admitting: Hematology

## 2022-05-04 ENCOUNTER — Other Ambulatory Visit: Payer: Self-pay

## 2022-05-04 ENCOUNTER — Ambulatory Visit (HOSPITAL_COMMUNITY)
Admission: RE | Admit: 2022-05-04 | Discharge: 2022-05-04 | Disposition: A | Discharge status: Home / Self Care | Payer: Commercial Managed Care - PPO | Source: Ambulatory Visit | Attending: Hematology | Admitting: Hematology

## 2022-05-04 DIAGNOSIS — C7951 Secondary malignant neoplasm of bone: Secondary | ICD-10-CM

## 2022-05-04 DIAGNOSIS — Z170 Estrogen receptor positive status [ER+]: Secondary | ICD-10-CM

## 2022-05-04 DIAGNOSIS — T451X5A Adverse effect of antineoplastic and immunosuppressive drugs, initial encounter: Secondary | ICD-10-CM

## 2022-05-04 DIAGNOSIS — C50411 Malignant neoplasm of upper-outer quadrant of right female breast: Secondary | ICD-10-CM | POA: Diagnosis not present

## 2022-05-04 MED ORDER — SODIUM CHLORIDE (PF) 0.9 % IJ SOLN
INTRAMUSCULAR | Status: AC
  Filled 2022-05-04: qty 50

## 2022-05-04 MED ORDER — TECHNETIUM TC 99M MEDRONATE IV KIT
20.0000 | PACK | Freq: Once | INTRAVENOUS | Status: AC | PRN
  Administered 2022-05-04: 20.8 via INTRAVENOUS

## 2022-05-04 MED ORDER — IOHEXOL 300 MG/ML  SOLN
100.0000 mL | Freq: Once | INTRAMUSCULAR | Status: AC | PRN
  Administered 2022-05-04: 100 mL via INTRAVENOUS

## 2022-05-04 NOTE — Assessment & Plan Note (Signed)
-  from breast cancer  --bone scan on 12/10/19 showed metastatic disease in left acetabulum, L3, and right 10th rib.  --Zometa started 06/18/20, but held after second dose due to loss of insurance. -will restart since she has insurance now

## 2022-05-04 NOTE — Assessment & Plan Note (Addendum)
-  stage IV [phone]), with bone and possible liver mets,  ER+/PR+/HER2+, Grade II-III   --diagnosed in 11/2019 with large right breast mass measuring up to 11cm and right lymphadenopathy. 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 Left axillary node biopsy was benign.  -01/01/20 Liver biopsy was negative but 01/12/20 Bone Biopsy was positive for metastatic carcinoma from primary breast cancer. ER/PR/HER2 were all negative, possibly related to decalcification. -She received THP 01/03/20 - 2/3/2 -on maintenance HP Phesgo injection every 3 weeks -last staging scan in 08/2020, not repeated due to lack of insurance and she is clinically doing well  -repeated CT and bone scan yesterday showed good partial response in liver, minimum uptake in bone mets, no concerns for new lesion or evidence of progression.  I personally reviewed his scan images and discussed with patient. -Will continue Phesgo injection every 3 weeks and letrozole, she is tolerating well

## 2022-05-04 NOTE — Assessment & Plan Note (Signed)
-  echo 09/22/20 showed decreased EF. She was started on losartan by Dr. Haroldine Laws on 10/21/20 and EF improved. -most recent echo on 01/26/22 was stable.

## 2022-05-04 NOTE — Progress Notes (Signed)
[place]   [phone] [phone]   Clinic Follow up Note   Patient Care Team: Julian Hy, PA-C as PCP - General (Physician Assistant) [name], MD as Consulting Physician (General Surgery) Truitt Merle, MD as Consulting Physician (Hematology) Kyung Rudd, MD as Consulting Physician (Radiation Oncology) Mauro Kaufmann, RN as Oncology Nurse Navigator Rockwell Germany, RN as Oncology Nurse Navigator  Date of Service:  05/05/2022  CHIEF COMPLAINT: f/u of metastatic breast cancer   CURRENT THERAPY:  -Maintenance Herceptin and Perjeta (Phesgo) q3weeks starting 06/18/20, held 09/22/20-11/24/20 due to low EF -Letrozole 2.'5mg'$  daily starting 06/18/20   ASSESSMENT:  Carolyn Stewart is a 62 y.o. female with   Malignant neoplasm of upper-outer quadrant of right breast in female, estrogen receptor positive ([place]) -stage IV [phone]), with bone and possible liver mets,  ER+/PR+/HER2+, Grade II-III   --diagnosed in 11/2019 with large right breast mass measuring up to 11cm and right lymphadenopathy. 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 Left axillary node biopsy was benign.  -01/01/20 Liver biopsy was negative but 01/12/20 Bone Biopsy was positive for metastatic carcinoma from primary breast cancer. ER/PR/HER2 were all negative, possibly related to decalcification. -She received THP 01/03/20 - 2/3/2 -on maintenance HP Phesgo injection every 3 weeks -last staging scan in 08/2020, not repeated due to lack of insurance and she is clinically doing well  -repeated CT and bone scan yesterday showed good partial response in liver, minimum uptake in bone mets, no concerns for new lesion or evidence of progression.  I personally reviewed his scan images and discussed with patient. -Will continue Phesgo injection every 3 weeks and letrozole, she is tolerating well   Metastasis to bone ([place]) -from breast cancer   --bone scan on 12/10/19 showed metastatic disease in left acetabulum, L3, and right 10th rib.  --Zometa started 06/18/20, but held after second dose due to loss of insurance. -will restart since she has insurance now   Chemotherapy induced cardiomyopathy ([place]) -echo 09/22/20 showed decreased EF. She was started on losartan by Dr. Haroldine Laws on 10/21/20 and EF improved. -most recent echo on 01/26/22 was stable.      PLAN: -discuss scan -good partial response, no evidence of progression  -discuss bone density -lab reviewed  -I refill Potassium -Continue Phesgo Injection q3 weeks -la,f/u and Phesgo in 9 weeks  SUMMARY OF ONCOLOGIC HISTORY: Oncology History Overview Note  Cancer Staging Malignant neoplasm of upper-outer quadrant of right breast in female, estrogen receptor positive ([place]) Staging form: Breast, AJCC 8th Edition - Clinical stage from 11/25/2019: Stage IIA (cT3, cN1, cM0, G2, ER+, PR+, HER2+) - Signed by Truitt Merle, MD on 12/03/2019    Malignant neoplasm of upper-outer quadrant of right breast in female, estrogen receptor positive ([place])  11/13/2019 Mammogram    Diagnostic Mammogram 11/13/19  IMPRESSION: -Highly suspicious mass and calcifications in the right breast centered between 9 and 12 o'clock spanning up to 11 cm.  -Multiple masses in the right axilla. One of the masses contains calcifications, denoted as mass number 2 measuring 9 x 11 by 10 mm. Several of these masses do not appear to represent definitive lymph nodes. Others may be small but abnormal appearing lymph nodes. Taken in total, a cluster of masses in the right axilla spans up to 3.7 cm.  -There are fibrocystic changes on the left. There are also some solid-appearing masses. A solid appearing mass at 10:30, 6 cm from the nipple measures  9 x 6 x 6 mm. An adjacent solid mass at 10 o'clock, 6 cm from the nipple measures 8 x 6 by 5 mm. Another solid-appearing mass at 11 o'clock, 1 cm from the nipple measures 5 x 4  by 5 mm. Fibrocystic changes are seen at 10 o'clock, 12 o'clock, and 4 o'clock. Another solid-appearing mass seen at 5 o'clock, 4 cm from the nipple measuring 6 x 3 x 4 mm. There is a single abnormal node in the left axilla with a cortex measuring 5 mm and restriction of the fatty hilum.   11/25/2019 Initial Biopsy   Diagnosis 1. Breast, right, needle core biopsy, upper outer - INVASIVE MAMMARY CARCINOMA - MAMMARY CARCINOMA IN SITU WITH CALCIFICATIONS AND NECROSIS - SEE COMMENT 2. Lymph node, needle/core biopsy, right axilla - INVASIVE MAMMARY CARCINOMA - SEE COMMENT 3. Lymph node, needle/core biopsy, left axilla - BENIGN LYMPH NODE - NO CARCINOMA IDENTIFIED Microscopic Comment 1. The biopsy material shows an infiltrative proliferation of cells arranged linearly and in small clusters. Based on the biopsy, the carcinoma appears Nottingham grade 2 of 3 and measures 1.2 cm in greatest linear extent. E-cadherin and prognostic markers (ER/PR/ki-67/HER2)are pending and will be reported in an addendum. Dr. Tresa Moore reviewed the case and agrees with the above diagnosis. These results were called to [facility] on November 26, 2019. 2. Definitive nodal tissue is not identified. This biopsy has a slightly different architecture morphology than what is seen in part 1. The carcinoma measures 0.7 cm in greatest dimension. E-cadherin and prognostic markers (ER, PR, Ki-67 and HER-2) are pending and will be reported in an addendum.   11/25/2019 Receptors her2   1. PROGNOSTIC INDICATORS Results: IMMUNOHISTOCHEMICAL AND MORPHOMETRIC ANALYSIS PERFORMED MANUALLY The tumor cells are POSITIVE for Her2 (3+). Estrogen Receptor: 90%, POSITIVE, STRONG STAINING INTENSITY Progesterone Receptor: 20%, POSITIVE, STRONG STAINING INTENSITY Proliferation Marker Ki67: 30%  2. PROGNOSTIC INDICATORS Results: IMMUNOHISTOCHEMICAL AND MORPHOMETRIC ANALYSIS PERFORMED MANUALLY The tumor cells are POSITIVE  for Her2 (3+). Estrogen Receptor: 90%, POSITIVE, STRONG STAINING INTENSITY Progesterone Receptor: 25%, POSITIVE, STRONG STAINING INTENSITY Proliferation Marker Ki67: 30%   11/25/2019 Cancer Staging   Staging form: Breast, AJCC 8th Edition - Clinical stage from 11/25/2019: Stage IIA (cT3, cN1, cM0, G2, ER+, PR+, HER2+) - Signed by Truitt Merle, MD on 12/03/2019   12/01/2019 Initial Diagnosis   Malignant neoplasm of upper-outer quadrant of right breast in female, estrogen receptor positive ([place])   12/10/2019 Imaging   Bone Scan  IMPRESSION: Findings concerning for bony metastatic disease in the left acetabulum region, L3 vertebral body region, and anterolateral right tenth rib.   Increased uptake in several large joints likely is of arthropathic etiology.   12/11/2019 Imaging   CT CAP w contrast  IMPRESSION: Prominent glandular tissue in the central right breast with nipple retraction, likely corresponding to the patient's known right breast neoplasm.   Prominent right axillary nodes measuring up to 8 mm short axis, suspicious for nodal metastases in this clinical context.   3 mm subpleural pulmonary nodule in the right lower lobe, likely benign. However, follow-up CT chest is suggested in 3-6 months.   Multiple hepatic lesions, including a dominant 2.4 cm lesion in segment 6, which is considered indeterminate. Metastasis is not excluded. Consider MRI abdomen with/without contrast for further characterization.   11 mm sclerotic lesion along the left posterior aspect of the T7 vertebral body raises concern for isolated osseous metastasis. Correlate with priors if available. Benign vertebral hemangioma at T11.  12/12/2019 Breast MRI   IMPRESSION: 1. The biopsy proven malignancy in the right breast involves all 4 quadrants and spans up to 9 cm by MRI. There is enhancement within the skin at the level of the nipple.   2. Multiple matted masses are seen in the right axilla,  either abnormal metastatic lymph nodes or a combination of masses and metastatic lymph nodes. One of these has been previously biopsied showing invasive mammary carcinoma without definitive nodal tissue.   3.  There is a 1.5 cm Rotter's lymph node on the right.   4. There is markedly abnormal enhancement and edema in the right pectoralis major muscle spanning 6-7 cm, suspicious for metastatic disease.   5. There is diffuse nodular enhancement throughout the left breast, which may correspond with the solid-appearing masses seen on ultrasound. These all have a similar appearance on MRI.   6.  No findings of left axillary lymphadenopathy.   12/15/2019 Procedure   PAC placed    12/24/2019 Imaging   MRI abdomen  IMPRESSION: 1. Multifocal enhancing bone lesions are noted within the lumbar spine, and bony pelvis compatible with metastatic disease. 2. Four, small peripherally enhancing cystic lesions are noted within the liver worrisome for metastatic disease. 3. 2 enhancing liver lesions are also noted with signal and enhancement characteristics consistent with benign liver hemangioma. 4. Well-circumscribed, enhancing T2 and T1 hypointense structure within the right adnexal region is indeterminate. It is unclear whether not this is arising from the right ovary, broad ligament or right side of uterine fundus. Differential considerations include fibrothecoma, versus pedunculated uterine leiomyoma. Metastatic disease is considered less favored. Attention on follow-up imaging is recommended.     01/03/2020 - 05/27/2020 Chemotherapy   First line THP q3weeks starting 01/03/20-05/27/20   01/05/2020 Imaging   US Abdomen  IMPRESSION: 1. Suspect hematoma in the medial right lobe of the liver in the area of previous biopsy. Note that recent MR does not show lesion in this area. This area has ill-defined margins and measures 3.9 x 4.0 x 3.5 cm. No perihepatic fluid.   2. Probable hemangiomas  elsewhere in the right lobe of the liver. Note that several smaller lesions seen on recent MR are not appreciable by ultrasound.   3.  Study otherwise unremarkable.   01/12/2020 Pathology Results   FINAL MICROSCOPIC DIAGNOSIS:   A. BONE, SCLEROTIC LESION, LEFT ANTERIOR ILIAC SPINE, BIOPSY:  - Metastatic carcinoma, consistent with patient's clinical history of  primary breast carcinoma  - See comment   COMMENT:  Immunohistochemical stains show that the tumor cells are positive for  CK7 and GATA3; and negative for CK20, consistent with above  interpretation.   PROGNOSTIC INDICATOR RESULTS:  The tumor cells are NEGATIVE for Her2 (0); see comment  Estrogen Receptor: NEGATIVE; see comment  Progesterone Receptor: NEGATIVE; see comment    03/23/2020 Imaging   CT CAP IMPRESSION: 1. Right lower lobe perifissural nodule is slightly more conspicuous on today's study. Close attention on follow-up recommended. 2. Interval progression of segment IV liver lesion, concerning for metastatic progression. The remaining visualized liver lesions are stable to smaller in the interval. 3. Interval evolution in appearance of thoracolumbar spinal lesions and left iliac crest lesion, potentially reflecting response to therapy/healing. No definite new osseous lesions on today's study. 4. New small fluid collection right cul-de-sac. 5. Probable fibroid change in the uterus including exophytic right fundal lesion. 6. Aortic Atherosclerosis (ICD10-I70.0).   03/31/2020 Imaging   MR ABD IMPRESSION: 1. Enlarging hepatic lesion in  hepatic subsegment IV suspicious for hepatic metastasis. The it is uncertain whether the change in the short interval is due to technical factors with different modalities or true enlargement. 2. Lesion in the posterior RIGHT hepatic lobe with imaging features that are atypical for hemangioma but stability and signs of enhancement on later phase raising this question. Another  more classic appearing may angioma seen inferior to this location in hepatic subsegment VI. Attention on follow-up. 3. Heterogeneous enhancement throughout the spine particularly at L3 as exhibited on the prior study with very patchy marrow signal remains suspicious for bony metastatic lesions in this patient with known bone metastases.   04/01/2020 Imaging   Bone scan IMPRESSION: Good response to therapy with resolution of sites of uptake seen previously at the cervical spine and posterior RIGHT tenth rib as well as a diminished degree of uptake at remaining previously identified osseous metastatic foci.   04/28/2020 PET scan   IMPRESSION: 1. No hypermetabolic lesions to suggest active metastatic disease involving the liver or osseous structures. 2. Stable small low-attenuation liver lesions, likely treated disease.   06/18/2020 -  Chemotherapy   Given her excellent response, I switched to maintenance therapy with Herceptin, Perjeta (Phesgo) q3weeks and Letrozole.   06/18/2020 -  Chemotherapy   Zometa starting 06/18/20   06/18/2020 -  Anti-estrogen oral therapy   Letrozole 2.'5mg'$  once dialy.    06/18/2020 -  Chemotherapy   Patient is on Treatment Plan : BREAST Trastuzumab + Pertuzumab q21d     09/03/2020 Imaging   Bone Scan IMPRESSION: 1. Significant interval improvement compared to previous studies. Only mild uptake remains in the pelvis as above. There has been significant interval improvement over time.  CT C/A/P IMPRESSION: 1. Hypodense liver lesions are slightly decreased in size compared to prior examination, consistent with treatment response of hepatic metastatic disease. 2. Unchanged sclerotic lesion of the anterior left iliac crest. Unchanged lytic superior endplate deformity of L3, which remains equivocal for metastatic lesion versus mechanical Schmorl deformity. No CT evidence of new osseous metastatic disease. 3. No evidence of new metastatic disease in the  chest, abdomen, or pelvis. 4. Coronary artery disease.   05/05/2022 Imaging    IMPRESSION: 1. Interval decreased size of the largest hepatic metastatic lesion with stable size of the additional subcentimeter bilobar hepatic lesions. Continued decrease in size of the bilobar hepatic metastases. 2. Similar appearance of the osseous metatatic disease. No new aggressive lytic or blastic lesion of bone. 3. No evidence of new or progressive metastatic disease in the chest, abdomen or pelvis.     05/05/2022 Imaging    IMPRESSION: Faint residual radiotracer uptake in the left hemipelvis.   New focus of mild abnormal radiotracer uptake in the right lateral seventh rib without discrete CT correlate on same day examination, nonspecific suggest correlation with history of interval trauma.        INTERVAL HISTORY:  Carolyn Stewart is here for a follow up of metastatic breast cancer  She was last seen by me on 03/24/2022 She presents to the clinic alone. Pt reports she having pain on the left side near the rib cage. Pt states the pain comes depending on what she is doing. Pt reports of joint pain in the left shoulder and left elbow, intermittent. Rate pain level 8. Pt reports of right knee pain.Pt denies having diarrhea.Over the pt is states she is doing well.      All other systems were reviewed with the patient and are negative.  MEDICAL  HISTORY:  Past Medical History:  Diagnosis Date   Cancer ([place]) 2021   Hypertension     SURGICAL HISTORY: Past Surgical History:  Procedure Laterality Date   left parotid tumor removal   2006   PORTACATH PLACEMENT Left 12/15/2019   Procedure: INSERTION PORT-A-CATH WITH ULTRASOUND GUIDANCE;  Surgeon: [name], MD;  Location: [facility];  Service: General;  Laterality: Left;   TONSILLECTOMY      I have reviewed the social history and family history with the patient and they are unchanged from previous note.  ALLERGIES:  has No Known  Allergies.  MEDICATIONS:  Current Outpatient Medications  Medication Sig Dispense Refill   acetaminophen (TYLENOL) 500 MG tablet Take 1,000 mg by mouth every 6 (six) hours as needed for moderate pain or headache.     amLODipine (NORVASC) 10 MG tablet Take 2 tablets (20 mg total) by mouth daily. 60 tablet 11   Cholecalciferol (VITAMIN D3) 50 MCG (2000 UT) TABS Take 2,000 Units by mouth daily.      gabapentin (NEURONTIN) 100 MG capsule TAKE 1 CAPSULE BY MOUTH AT BEDTIME FOR ONE MONTH, THEN INCREASE AS TOLERATED UP TO 3 CAPSULES AT NIGHT 60 capsule 2   letrozole (FEMARA) 2.5 MG tablet Take 1 tablet (2.5 mg total) by mouth daily. 90 tablet 1   lidocaine-prilocaine (EMLA) cream Apply 1 application topically as needed. 30 g 1   losartan (COZAAR) 100 MG tablet TAKE ONE TABLET BY MOUTH DAILY 90 tablet 3   metoprolol succinate (TOPROL-XL) 100 MG 24 hr tablet Take 1 tablet (100 mg total) by mouth daily. 30 tablet 6   omeprazole (PRILOSEC OTC) 20 MG tablet Take 20 mg by mouth daily.     potassium chloride (KLOR-CON M) 10 MEQ tablet Take 2 tablets (20 mEq total) by mouth daily. 90 tablet 1   No current facility-administered medications for this visit.   Facility-Administered Medications Ordered in Other Visits  Medication Dose Route Frequency Provider Last Rate Last Admin   0.9 %  sodium chloride infusion   Intravenous Once Truitt Merle, MD        PHYSICAL EXAMINATION: ECOG PERFORMANCE STATUS: 1 - Symptomatic but completely ambulatory  Vitals:   05/05/22 0923  BP: 130/75  Pulse: 83  Resp: 17  Temp: 98.3 F (36.8 C)  SpO2: 100%   Wt Readings from Last 3 Encounters:  05/05/22 202 lb 9.6 oz (91.9 kg)  03/24/22 205 lb 9.6 oz (93.3 kg)  03/03/22 202 lb 9.6 oz (91.9 kg)     GENERAL:alert, no distress and comfortable SKIN: skin color normal, no rashes or significant lesions EYES: normal, Conjunctiva are pink and non-injected, sclera clear  NEURO: alert & oriented x 3 with fluent  speech  LABORATORY DATA:  I have reviewed the data as listed    Latest Ref Rng & Units 05/05/2022    9:07 AM 03/24/2022    9:25 AM 02/10/2022    9:20 AM  CBC  WBC 4.0 - 10.5 K/uL 5.5  8.6  5.6   Hemoglobin 12.0 - 15.0 g/dL 13.2  12.7  12.9   Hematocrit 36.0 - 46.0 % 40.0  37.8  39.1   Platelets 150 - 400 K/uL 297  303  287         Latest Ref Rng & Units 05/05/2022    9:07 AM 03/24/2022    9:25 AM 02/10/2022    9:20 AM  CMP  Glucose 70 - 99 mg/dL 129  123  134  BUN 8 - 23 mg/dL '9  12  14   '$ Creatinine 0.44 - 1.00 mg/dL 0.71  0.76  0.79   Sodium 135 - 145 mmol/L 141  140  142   Potassium 3.5 - 5.1 mmol/L 3.3  3.3  3.5   Chloride 98 - 111 mmol/L 109  109  108   CO2 22 - 32 mmol/L '27  26  26   '$ Calcium 8.9 - 10.3 mg/dL 9.0  9.2  9.3   Total Protein 6.5 - 8.1 g/dL 6.9  7.2  7.0   Total Bilirubin 0.3 - 1.2 mg/dL 0.4  0.4  0.5   Alkaline Phos 38 - 126 U/L 114  106  102   AST 15 - 41 U/L '14  14  14   '$ ALT 0 - 44 U/L '8  12  8       '$ RADIOGRAPHIC STUDIES: I have personally reviewed the radiological images as listed and agreed with the findings in the report. CT CHEST ABDOMEN PELVIS W CONTRAST  Result Date: 05/05/2022 CLINICAL DATA:  History of invasive metastatic breast cancer. * Tracking Code: BO * EXAM: CT CHEST, ABDOMEN, AND PELVIS WITH CONTRAST TECHNIQUE: Multidetector CT imaging of the chest, abdomen and pelvis was performed following the standard protocol during bolus administration of intravenous contrast. RADIATION DOSE REDUCTION: This exam was performed according to the departmental dose-optimization program which includes automated exposure control, adjustment of the mA and/or kV according to patient size and/or use of iterative reconstruction technique. CONTRAST:  11m OMNIPAQUE IOHEXOL 300 MG/ML  SOLN COMPARISON:  Multiple priors including same-day nuclear medicine bone scan and CT chest abdomen pelvis Sep 03, 2020 FINDINGS: CT CHEST FINDINGS Cardiovascular: Left chest  Port-A-Cath with tip at the superior cavoatrial junction. Normal caliber abdominal aorta. No central pulmonary embolus on this nondedicated study. Normal size heart. No significant pericardial effusion/thickening. Mediastinum/Nodes: No supraclavicular adenopathy. No suspicious thyroid nodule. No pathologically enlarged mediastinal, hilar or axillary lymph nodes. The esophagus is grossly unremarkable. Lungs/Pleura: No suspicious pulmonary nodules or masses. No focal airspace consolidation. No pleural effusion. No pneumothorax. Musculoskeletal: No suspicious chest wall lesion. CT ABDOMEN PELVIS FINDINGS Hepatobiliary: Decreased size of the largest lesion in the right lobe of the liver with the other tiny bilobar hypodense hepatic lesions unchanged in size. No new suspicious hepatic lesion. Previously indexed lesions are as follows: -largest lesion in the right lobe of the liver measures 14 x 9 mm on image 52/2 previously 20 x 18 mm. -anterior right lobe of the liver lesion segment V/VIII measures 5 mm on image 49/2, unchanged. Gallbladder is unremarkable.  No biliary ductal dilation. Pancreas: No pancreatic ductal dilation or evidence of acute inflammation. Spleen: No splenomegaly or focal splenic lesion. Adrenals/Urinary Tract: Bilateral adrenal glands appear normal. No hydronephrosis. Kidneys demonstrate symmetric enhancement and excretion of contrast material. Urinary bladder is unremarkable for degree of distension. Stomach/Bowel: Stomach is unremarkable for degree of distension. No pathologic dilation of small or large bowel. No evidence of acute bowel inflammation. Vascular/Lymphatic: Normal caliber abdominal aorta. Smooth IVC contours. The portal, splenic and superior mesenteric veins are patent. No pathologically enlarged abdominal or pelvic lymph nodes. Reproductive: Uterine leiomyomas including a pedunculated or broad ligament fibroid in the right adnexa. Other: No significant abdominopelvic free fluid.  Musculoskeletal: Similar appearance of the sclerotic lesion in the anterior left iliac crest. No significant interval change in the lytic L3 superior endplate lesion. Benign vertebral body hemangioma at T11. Similar lucency in the posterior elements at T12 and  L4 as well as in the left acetabulum on image 105/2. No new aggressive lytic or blastic lesion of bone. Specifically no lesion identified in the lateral right seventh rib correspond with the finding on prior bone scan. IMPRESSION: 1. Interval decreased size of the largest hepatic metastatic lesion with stable size of the additional subcentimeter bilobar hepatic lesions. Continued decrease in size of the bilobar hepatic metastases. 2. Similar appearance of the osseous metatatic disease. No new aggressive lytic or blastic lesion of bone. 3. No evidence of new or progressive metastatic disease in the chest, abdomen or pelvis. Electronically Signed   By: Dahlia Bailiff M.D.   On: 05/05/2022 08:48   NM Bone Scan Whole Body  Result Date: 05/05/2022 CLINICAL DATA:  History of stage IV breast cancer. Patient endorses left shoulder elbow and right knee pain. EXAM: NUCLEAR MEDICINE WHOLE BODY BONE SCAN TECHNIQUE: Whole body anterior and posterior images were obtained approximately 3 hours after intravenous injection of radiopharmaceutical. RADIOPHARMACEUTICALS:  20.8 mCi Technetium-79mMDP IV COMPARISON:  Multiple priors including same-day CT chest abdomen pelvis November 02, 2022 and nuclear medicine bone scan Sep 03, 2020. FINDINGS: Faint residual radiotracer uptake in the left iliac crest and left acetabulum. Focus of mild abnormal radiotracer uptake in the right lateral seventh rib, no correlate identified on same day CT. Multifocal radiotracer uptake involving the shoulders, sternoclavicular joints, SI joints, hips, knees, elbows, ankles and spine. In a pattern most consistent with degenerative arthropathy. Otherwise physiologic distribution of radiotracer  activity. IMPRESSION: Faint residual radiotracer uptake in the left hemipelvis. New focus of mild abnormal radiotracer uptake in the right lateral seventh rib without discrete CT correlate on same day examination, nonspecific suggest correlation with history of interval trauma. Electronically Signed   By: JDahlia BailiffM.D.   On: 05/05/2022 08:35      No orders of the defined types were placed in this encounter.  All questions were answered. The patient knows to call the clinic with any problems, questions or concerns. No barriers to learning was detected. The total time spent in the appointment was 30 minutes.     YTruitt Merle MD 05/05/2022   IFelicity Coyer CMA, am acting as scribe for YTruitt Merle MD.   I have reviewed the above documentation for accuracy and completeness, and I agree with the above.

## 2022-05-05 ENCOUNTER — Inpatient Hospital Stay: Payer: Commercial Managed Care - PPO

## 2022-05-05 ENCOUNTER — Inpatient Hospital Stay: Payer: Commercial Managed Care - PPO | Attending: Hematology | Admitting: Hematology

## 2022-05-05 ENCOUNTER — Encounter: Payer: Self-pay | Admitting: Hematology

## 2022-05-05 VITALS — BP 130/75 | HR 83 | Temp 98.3°F | Resp 17 | Wt 202.6 lb

## 2022-05-05 DIAGNOSIS — T451X5A Adverse effect of antineoplastic and immunosuppressive drugs, initial encounter: Secondary | ICD-10-CM

## 2022-05-05 DIAGNOSIS — Z170 Estrogen receptor positive status [ER+]: Secondary | ICD-10-CM

## 2022-05-05 DIAGNOSIS — C50411 Malignant neoplasm of upper-outer quadrant of right female breast: Secondary | ICD-10-CM | POA: Diagnosis not present

## 2022-05-05 DIAGNOSIS — I10 Essential (primary) hypertension: Secondary | ICD-10-CM | POA: Insufficient documentation

## 2022-05-05 DIAGNOSIS — Z5111 Encounter for antineoplastic chemotherapy: Secondary | ICD-10-CM | POA: Insufficient documentation

## 2022-05-05 DIAGNOSIS — C7951 Secondary malignant neoplasm of bone: Secondary | ICD-10-CM | POA: Insufficient documentation

## 2022-05-05 DIAGNOSIS — I427 Cardiomyopathy due to drug and external agent: Secondary | ICD-10-CM

## 2022-05-05 LAB — CMP (CANCER CENTER ONLY)
ALT: 8 U/L (ref 0–44)
AST: 14 U/L — ABNORMAL LOW (ref 15–41)
Albumin: 4 g/dL (ref 3.5–5.0)
Alkaline Phosphatase: 114 U/L (ref 38–126)
Anion gap: 5 (ref 5–15)
BUN: 9 mg/dL (ref 8–23)
CO2: 27 mmol/L (ref 22–32)
Calcium: 9 mg/dL (ref 8.9–10.3)
Chloride: 109 mmol/L (ref 98–111)
Creatinine: 0.71 mg/dL (ref 0.44–1.00)
GFR, Estimated: >60 mL/min (ref >60)
Glucose, Bld: 129 mg/dL — ABNORMAL HIGH (ref 70–99)
Potassium: 3.3 mmol/L — ABNORMAL LOW (ref 3.5–5.1)
Sodium: 141 mmol/L (ref 135–145)
Total Bilirubin: 0.4 mg/dL (ref 0.3–1.2)
Total Protein: 6.9 g/dL (ref 6.5–8.1)

## 2022-05-05 LAB — CBC WITH DIFFERENTIAL (CANCER CENTER ONLY)
Abs Immature Granulocytes: 0.02 10*3/uL (ref 0.00–0.07)
Basophils Absolute: 0 10*3/uL (ref 0.0–0.1)
Basophils Relative: 0 %
Eosinophils Absolute: 0.2 10*3/uL (ref 0.0–0.5)
Eosinophils Relative: 4 %
HCT: 40 % (ref 36.0–46.0)
Hemoglobin: 13.2 g/dL (ref 12.0–15.0)
Immature Granulocytes: 0 %
Lymphocytes Relative: 50 %
Lymphs Abs: 2.7 10*3/uL (ref 0.7–4.0)
MCH: 29.5 pg (ref 26.0–34.0)
MCHC: 33 g/dL (ref 30.0–36.0)
MCV: 89.5 fL (ref 80.0–100.0)
Monocytes Absolute: 0.4 10*3/uL (ref 0.1–1.0)
Monocytes Relative: 7 %
Neutro Abs: 2.2 10*3/uL (ref 1.7–7.7)
Neutrophils Relative %: 39 %
Platelet Count: 297 10*3/uL (ref 150–400)
RBC: 4.47 MIL/uL (ref 3.87–5.11)
RDW: 13.7 % (ref 11.5–15.5)
WBC Count: 5.5 10*3/uL (ref 4.0–10.5)
nRBC: 0 % (ref 0.0–0.2)

## 2022-05-05 MED ORDER — HEPARIN SOD (PORK) LOCK FLUSH 100 UNIT/ML IV SOLN
500.0000 [IU] | Freq: Once | INTRAVENOUS | Status: AC
  Administered 2022-05-05: 500 [IU]

## 2022-05-05 MED ORDER — SODIUM CHLORIDE 0.9 % IV SOLN: Freq: Once | INTRAVENOUS | Status: DC

## 2022-05-05 MED ORDER — POTASSIUM CHLORIDE CRYS ER 10 MEQ PO TBCR: 20.0000 meq | EXTENDED_RELEASE_TABLET | Freq: Every day | ORAL | 1 refills | Status: DC

## 2022-05-05 MED ORDER — ACETAMINOPHEN 325 MG PO TABS
650.0000 mg | ORAL_TABLET | Freq: Once | ORAL | Status: AC
  Administered 2022-05-05: 650 mg via ORAL
  Filled 2022-05-05: qty 2

## 2022-05-05 MED ORDER — PERTUZ-TRASTUZ-HYALURON-ZZXF 60-60-2000 MG-MG-U/ML CHEMO ~~LOC~~ SOLN
10.0000 mL | Freq: Once | SUBCUTANEOUS | Status: AC
  Administered 2022-05-05: 10 mL via SUBCUTANEOUS
  Filled 2022-05-05: qty 10

## 2022-05-05 MED ORDER — DIPHENHYDRAMINE HCL 25 MG PO CAPS
50.0000 mg | ORAL_CAPSULE | Freq: Once | ORAL | Status: AC
  Administered 2022-05-05: 50 mg via ORAL
  Filled 2022-05-05: qty 2

## 2022-05-05 MED ORDER — SODIUM CHLORIDE 0.9% FLUSH
10.0000 mL | Freq: Once | INTRAVENOUS | Status: AC
  Administered 2022-05-05: 10 mL

## 2022-05-05 NOTE — Progress Notes (Signed)
Patient will receive Zometa next infusion visit 05/26/22.

## 2022-05-05 NOTE — Patient Instructions (Signed)
[facility]  Discharge Instructions: Thank you for choosing [facility] to provide your oncology and hematology care.   If you have a lab appointment with the [facility], please go directly to the [facility] and check in at the registration area.   Wear comfortable clothing and clothing appropriate for easy access to any Portacath or PICC line.   We strive to give you quality time with your provider. You may need to reschedule your appointment if you arrive late (15 or more minutes).  Arriving late affects you and other patients whose appointments are after yours.  Also, if you miss three or more appointments without notifying the office, you may be dismissed from the clinic at the provider's discretion.      For prescription refill requests, have your pharmacy contact our office and allow 72 hours for refills to be completed.    Today you received the following chemotherapy and/or immunotherapy agents: Phesgo.       To help prevent nausea and vomiting after your treatment, we encourage you to take your nausea medication as directed.  BELOW ARE SYMPTOMS THAT SHOULD BE REPORTED IMMEDIATELY: *FEVER GREATER THAN 100.4 F (38 C) OR HIGHER *CHILLS OR SWEATING *NAUSEA AND VOMITING THAT IS NOT CONTROLLED WITH YOUR NAUSEA MEDICATION *UNUSUAL SHORTNESS OF BREATH *UNUSUAL BRUISING OR BLEEDING *URINARY PROBLEMS (pain or burning when urinating, or frequent urination) *BOWEL PROBLEMS (unusual diarrhea, constipation, pain near the anus) TENDERNESS IN MOUTH AND THROAT WITH OR WITHOUT PRESENCE OF ULCERS (sore throat, sores in mouth, or a toothache) UNUSUAL RASH, SWELLING OR PAIN  UNUSUAL VAGINAL DISCHARGE OR ITCHING   Items with * indicate a potential emergency and should be followed up as soon as possible or go to the Emergency Department if any problems should occur.  Please show the CHEMOTHERAPY ALERT CARD or IMMUNOTHERAPY ALERT CARD at check-in to  the Emergency Department and triage nurse.  Should you have questions after your visit or need to cancel or reschedule your appointment, please contact [facility]  Dept: [phone]  and follow the prompts.  Office hours are 8:00 a.m. to 4:30 p.m. Monday - Friday. Please note that voicemails left after 4:00 p.m. may not be returned until the following business day.  We are closed weekends and major holidays. You have access to a nurse at all times for urgent questions. Please call the main number to the clinic Dept: [phone] and follow the prompts.   For any non-urgent questions, you may also contact your provider using MyChart. We now offer e-Visits for anyone 46 and older to request care online for non-urgent symptoms. For details visit [email or web address].   Also download the MyChart app! Go to the app store, search "MyChart", open the app, select [facility], and log in with your MyChart username and password.  Masks are optional in the cancer centers. If you would like for your care team to wear a mask while they are taking care of you, please let them know. You may have one support person who is at least 63 years old accompany you for your appointments.

## 2022-05-06 ENCOUNTER — Other Ambulatory Visit: Payer: Self-pay

## 2022-05-06 LAB — CANCER ANTIGEN 27.29: CA 27.29: 33.3 U/mL (ref 0.0–38.6)

## 2022-05-26 ENCOUNTER — Inpatient Hospital Stay: Payer: Commercial Managed Care - PPO | Attending: Hematology

## 2022-05-26 ENCOUNTER — Other Ambulatory Visit: Payer: Self-pay | Admitting: Hematology

## 2022-05-26 VITALS — BP 125/69 | HR 72 | Temp 98.1°F | Resp 18 | Ht 67.0 in | Wt 202.2 lb

## 2022-05-26 DIAGNOSIS — Z170 Estrogen receptor positive status [ER+]: Secondary | ICD-10-CM | POA: Insufficient documentation

## 2022-05-26 DIAGNOSIS — Z5111 Encounter for antineoplastic chemotherapy: Secondary | ICD-10-CM | POA: Insufficient documentation

## 2022-05-26 DIAGNOSIS — C50411 Malignant neoplasm of upper-outer quadrant of right female breast: Secondary | ICD-10-CM | POA: Diagnosis not present

## 2022-05-26 DIAGNOSIS — C7951 Secondary malignant neoplasm of bone: Secondary | ICD-10-CM | POA: Diagnosis not present

## 2022-05-26 MED ORDER — SODIUM CHLORIDE 0.9% FLUSH
10.0000 mL | INTRAVENOUS | Status: DC | PRN
  Administered 2022-05-26: 10 mL

## 2022-05-26 MED ORDER — ACETAMINOPHEN 325 MG PO TABS
650.0000 mg | ORAL_TABLET | Freq: Once | ORAL | Status: AC
  Administered 2022-05-26: 650 mg via ORAL
  Filled 2022-05-26: qty 2

## 2022-05-26 MED ORDER — DIPHENHYDRAMINE HCL 25 MG PO CAPS
50.0000 mg | ORAL_CAPSULE | Freq: Once | ORAL | Status: AC
  Administered 2022-05-26: 50 mg via ORAL
  Filled 2022-05-26: qty 2

## 2022-05-26 MED ORDER — SODIUM CHLORIDE 0.9 % IV SOLN: Freq: Once | INTRAVENOUS | Status: AC

## 2022-05-26 MED ORDER — ZOLEDRONIC ACID 4 MG/100ML IV SOLN
4.0000 mg | Freq: Once | INTRAVENOUS | Status: AC
  Administered 2022-05-26: 4 mg via INTRAVENOUS
  Filled 2022-05-26: qty 100

## 2022-05-26 MED ORDER — HEPARIN SOD (PORK) LOCK FLUSH 100 UNIT/ML IV SOLN
500.0000 [IU] | Freq: Once | INTRAVENOUS | Status: AC | PRN
  Administered 2022-05-26: 500 [IU]

## 2022-05-26 MED ORDER — PERTUZ-TRASTUZ-HYALURON-ZZXF 60-60-2000 MG-MG-U/ML CHEMO ~~LOC~~ SOLN
10.0000 mL | Freq: Once | SUBCUTANEOUS | Status: AC
  Administered 2022-05-26: 10 mL via SUBCUTANEOUS
  Filled 2022-05-26: qty 10

## 2022-05-26 NOTE — Patient Instructions (Signed)
[facility]  Discharge Instructions: Thank you for choosing [facility] to provide your oncology and hematology care.   If you have a lab appointment with the [facility], please go directly to the [facility] and check in at the registration area.   Wear comfortable clothing and clothing appropriate for easy access to any Portacath or PICC line.   We strive to give you quality time with your provider. You may need to reschedule your appointment if you arrive late (15 or more minutes).  Arriving late affects you and other patients whose appointments are after yours.  Also, if you miss three or more appointments without notifying the office, you may be dismissed from the clinic at the provider's discretion.      For prescription refill requests, have your pharmacy contact our office and allow 72 hours for refills to be completed.    Today you received the following chemotherapy and/or immunotherapy agents: Phesgo.       To help prevent nausea and vomiting after your treatment, we encourage you to take your nausea medication as directed.  BELOW ARE SYMPTOMS THAT SHOULD BE REPORTED IMMEDIATELY: *FEVER GREATER THAN 100.4 F (38 C) OR HIGHER *CHILLS OR SWEATING *NAUSEA AND VOMITING THAT IS NOT CONTROLLED WITH YOUR NAUSEA MEDICATION *UNUSUAL SHORTNESS OF BREATH *UNUSUAL BRUISING OR BLEEDING *URINARY PROBLEMS (pain or burning when urinating, or frequent urination) *BOWEL PROBLEMS (unusual diarrhea, constipation, pain near the anus) TENDERNESS IN MOUTH AND THROAT WITH OR WITHOUT PRESENCE OF ULCERS (sore throat, sores in mouth, or a toothache) UNUSUAL RASH, SWELLING OR PAIN  UNUSUAL VAGINAL DISCHARGE OR ITCHING   Items with * indicate a potential emergency and should be followed up as soon as possible or go to the Emergency Department if any problems should occur.  Please show the CHEMOTHERAPY ALERT CARD or IMMUNOTHERAPY ALERT CARD at check-in  to the Emergency Department and triage nurse.  Should you have questions after your visit or need to cancel or reschedule your appointment, please contact [facility]  Dept: [phone]  and follow the prompts.  Office hours are 8:00 a.m. to 4:30 p.m. Monday - Friday. Please note that voicemails left after 4:00 p.m. may not be returned until the following business day.  We are closed weekends and major holidays. You have access to a nurse at all times for urgent questions. Please call the main number to the clinic Dept: [phone] and follow the prompts.   For any non-urgent questions, you may also contact your provider using MyChart. We now offer e-Visits for anyone 36 and older to request care online for non-urgent symptoms. For details visit [email or web address].   Also download the MyChart app! Go to the app store, search "MyChart", open the app, select [facility], and log in with your MyChart username and password.  Masks are optional in the cancer centers. If you would like for your care team to wear a mask while they are taking care of you, please let them know. You may have one support person who is at least 62 years old accompany you for your appointments.  Zoledronic Acid Injection (Cancer) What is this medication? ZOLEDRONIC ACID (ZOE le dron ik AS id) treats high calcium levels in the blood caused by cancer. It may also be used with chemotherapy to treat weakened bones caused by cancer. It works by slowing down the release of calcium from bones. This lowers calcium levels in your blood.  It also makes your bones stronger and less likely to break (fracture). It belongs to a group of medications called bisphosphonates. This medicine may be used for other purposes; ask your health care provider or pharmacist if you have questions. COMMON BRAND NAME(S): Zometa, Zometa Powder What should I tell my care team before I take this medication? They need to  know if you have any of these conditions: Dehydration Dental disease Kidney disease Liver disease Low levels of calcium in the blood Lung or breathing disease, such as asthma Receiving steroids, such as dexamethasone or prednisone An unusual or allergic reaction to zoledronic acid, other medications, foods, dyes, or preservatives Pregnant or trying to get pregnant Breast-feeding How should I use this medication? This medication is injected into a vein. It is given by your care team in a hospital or clinic setting. Talk to your care team about the use of this medication in children. Special care may be needed. Overdosage: If you think you have taken too much of this medicine contact a poison control center or emergency room at once. NOTE: This medicine is only for you. Do not share this medicine with others. What if I miss a dose? Keep appointments for follow-up doses. It is important not to miss your dose. Call your care team if you are unable to keep an appointment. What may interact with this medication? Certain antibiotics given by injection Diuretics, such as bumetanide, furosemide NSAIDs, medications for pain and inflammation, such as ibuprofen or naproxen Teriparatide Thalidomide This list may not describe all possible interactions. Give your health care provider a list of all the medicines, herbs, non-prescription drugs, or dietary supplements you use. Also tell them if you smoke, drink alcohol, or use illegal drugs. Some items may interact with your medicine. What should I watch for while using this medication? Visit your care team for regular checks on your progress. It may be some time before you see the benefit from this medication. Some people who take this medication have severe bone, joint, or muscle pain. This medication may also increase your risk for jaw problems or a broken thigh bone. Tell your care team right away if you have severe pain in your jaw, bones, joints, or  muscles. Tell you care team if you have any pain that does not go away or that gets worse. Tell your dentist and dental surgeon that you are taking this medication. You should not have major dental surgery while on this medication. See your dentist to have a dental exam and fix any dental problems before starting this medication. Take good care of your teeth while on this medication. Make sure you see your dentist for regular follow-up appointments. You should make sure you get enough calcium and vitamin D while you are taking this medication. Discuss the foods you eat and the vitamins you take with your care team. Check with your care team if you have severe diarrhea, nausea, and vomiting, or if you sweat a lot. The loss of too much body fluid may make it dangerous for you to take this medication. You may need bloodwork while taking this medication. Talk to your care team if you wish to become pregnant or think you might be pregnant. This medication can cause serious birth defects. What side effects may I notice from receiving this medication? Side effects that you should report to your care team as soon as possible: Allergic reactions--skin rash, itching, hives, swelling of the face, lips, tongue, or throat Kidney injury--decrease  in the amount of urine, swelling of the ankles, hands, or feet Low calcium level--muscle pain or cramps, confusion, tingling, or numbness in the hands or feet Osteonecrosis of the jaw--pain, swelling, or redness in the mouth, numbness of the jaw, poor healing after dental work, unusual discharge from the mouth, visible bones in the mouth Severe bone, joint, or muscle pain Side effects that usually do not require medical attention (report to your care team if they continue or are bothersome): Constipation Fatigue Fever Loss of appetite Nausea Stomach pain This list may not describe all possible side effects. Call your doctor for medical advice about side effects. You  may report side effects to FDA at 1-800-FDA-1088. Where should I keep my medication? This medication is given in a hospital or clinic. It will not be stored at home. NOTE: This sheet is a summary. It may not cover all possible information. If you have questions about this medicine, talk to your doctor, pharmacist, or health care provider.  2023 Elsevier/Gold Standard (2021-05-26 00:00:00)

## 2022-06-01 ENCOUNTER — Other Ambulatory Visit: Payer: Self-pay | Admitting: Hematology and Oncology

## 2022-06-01 DIAGNOSIS — Z170 Estrogen receptor positive status [ER+]: Secondary | ICD-10-CM

## 2022-06-07 IMAGING — US US AXILLARY NODE CORE BIOPSY LEFT
1 series · 14 of 14 positions shown · non-contrast
Comparison: Previous exam(s).
COMPARISON: Previous exam(s).

Addendum:
CLINICAL DATA: 59-year-old female for tissue sampling of 11 cm
RIGHT breast mass, abnormal RIGHT axillary lymph node and abnormal
LEFT axillary lymph node.

[Series 1: us axillary node core biopsy left · 0.07mm/px · 14 of 14 slices shown]
[im 1/14]
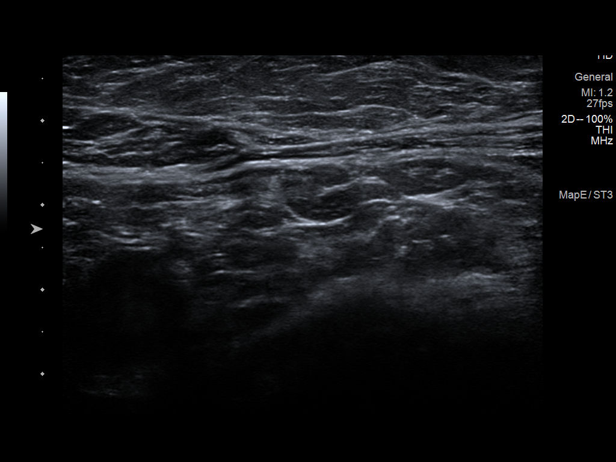
[im 2/14]
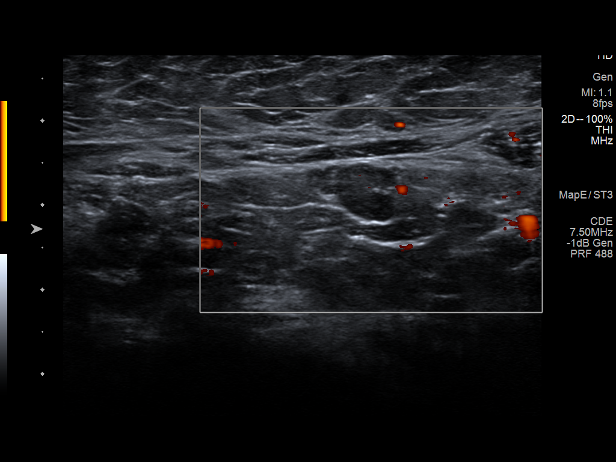
[im 3/14]
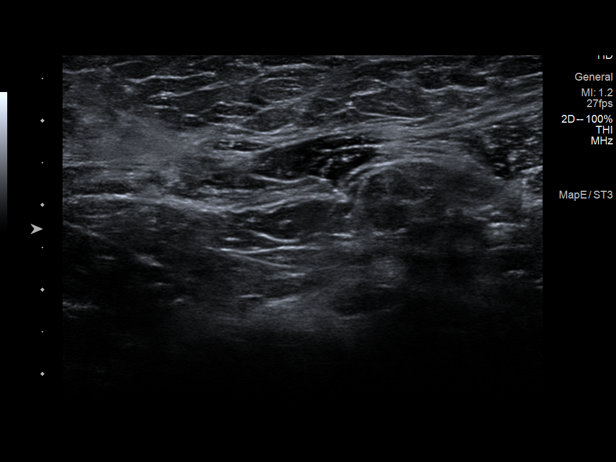
[im 4/14]
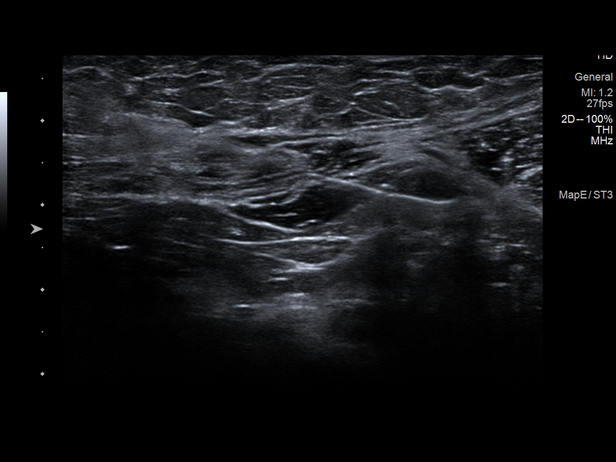
[im 5/14]
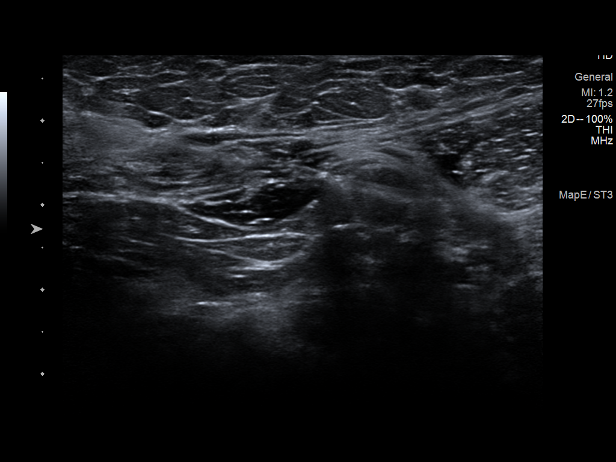
[im 6/14]
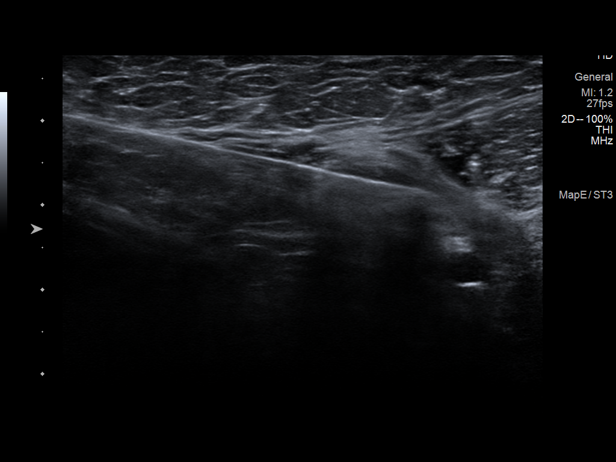
[im 7/14]
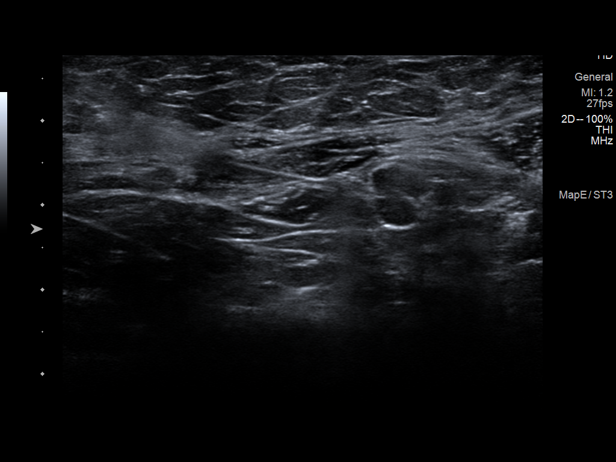
[im 8/14]
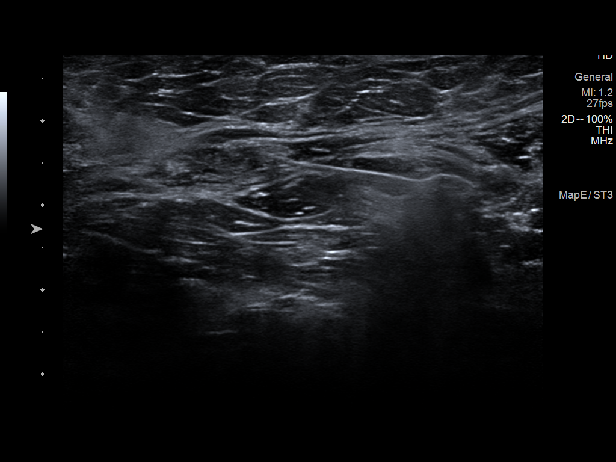
[im 9/14]
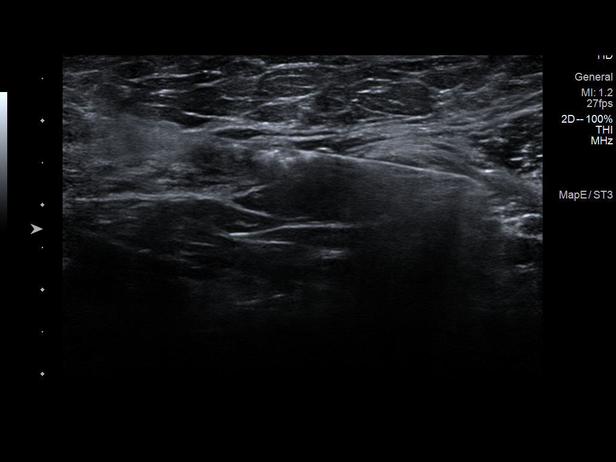
[im 10/14]
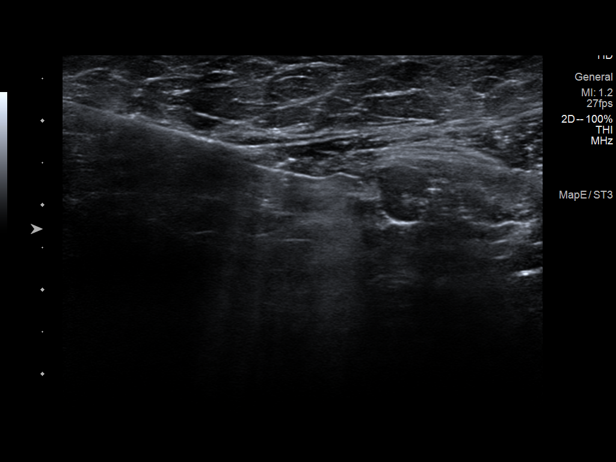
[im 11/14]
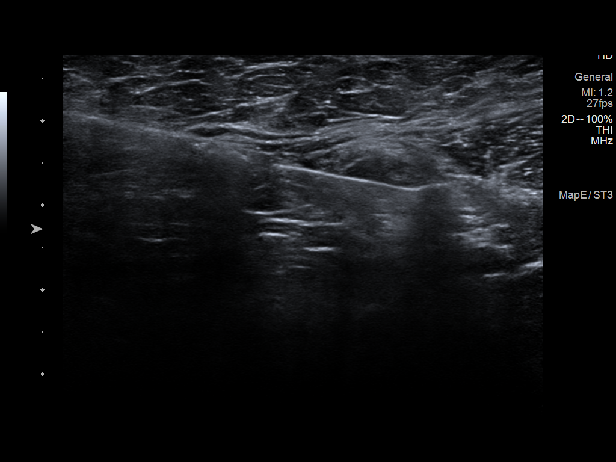
[im 12/14]
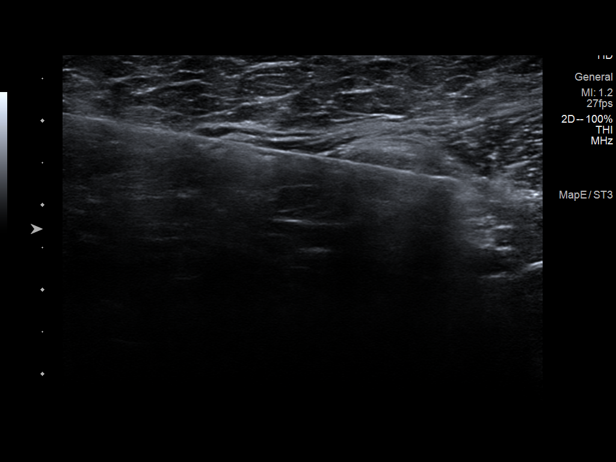
[im 13/14]
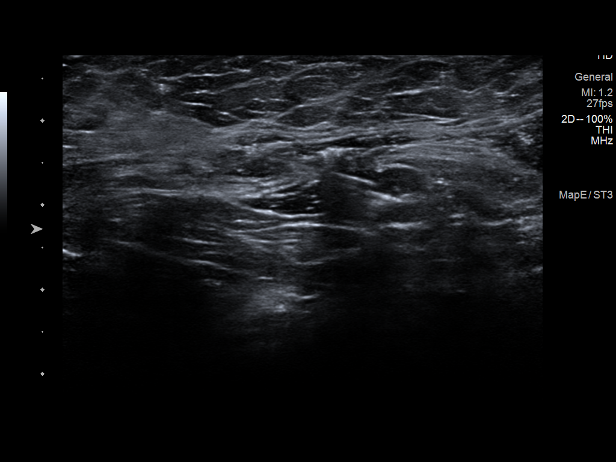
[im 14/14]
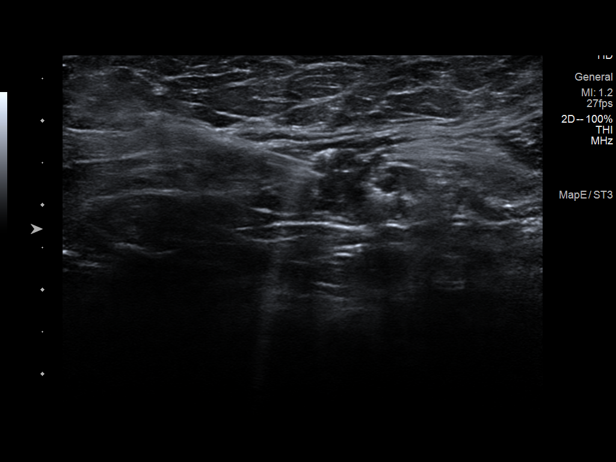

[14 of 14 positions shown; findings below may reference images not displayed]

A abnormal RIGHT axillary lymph node was biopsied instead of the
slightly irregular RIGHT axillary mass/lymph node due to
location/technical difficulties in sampling.

EXAM:
ULTRASOUND GUIDED RIGHT BREAST CORE NEEDLE BIOPSY

ULTRASOUND-GUIDED BIOPSY OF A RIGHT AXILLARY LYMPH NODE

ULTRASOUND-GUIDED BIOPSY OF A LEFT AXILLARY LYMPH NODE
PROCEDURE:
I met with the patient and we discussed the procedure of
ultrasound-guided biopsy, including benefits and alternatives. We
discussed the high likelihood of a successful procedure. We
discussed the risks of the procedure, including infection, bleeding,
tissue injury, clip migration, and inadequate sampling. Informed
written consent was given. The usual time-out protocol was performed
immediately prior to the procedure.

ULTRASOUND-GUIDED RIGHT BREAST CORE NEEDLE BIOPSY (RIBBON CLIP):

Lesion quadrant: UPPER-OUTER RIGHT breast

Using sterile technique and 1% Lidocaine as local anesthetic, under
direct ultrasound visualization, a 12 gauge Ricci device was
used to perform biopsy of the 11 cm mass within the UPPER-OUTER
RIGHT breast using a LATERAL approach. At the conclusion of the
procedure a RIBBON tissue marker clip was deployed into the biopsy
cavity. Follow up 2 view mammogram was performed and dictated
separately.

ULTRASOUND-GUIDED CORE BIOPSY OF A RIGHT AXILLARY LYMPH NODE (Q
clip):

Using sterile technique and 1% Lidocaine as local anesthetic, under
direct ultrasound visualization, a 14 gauge Ricci device was
used to perform biopsy of 1 of the abnormal RIGHT axillary lymph
nodes with cortical thickening using a inferolateral approach. At
the conclusion of the procedure a Q tissue marker clip was deployed
into the biopsy cavity, with satisfactory placement confirmed
sonographically.

ULTRASOUND-GUIDED CORE BIOPSY OF A LEFT AXILLARY LYMPH NODE (Q
clip):

Using sterile technique and 1% Lidocaine as local anesthetic, under
direct ultrasound visualization, a 14 gauge Ricci device was
used to perform biopsy of the abnormal LEFT axillary lymph node with
cortical thickening using a MEDIAL approach. At the conclusion of
the procedure a Q tissue marker clip was deployed into the biopsy
cavity, with satisfactory placement confirmed sonographically.
IMPRESSION: Ultrasound guided biopsies of the 11 cm UPPER-OUTER RIGHT breast
mass, an abnormal RIGHT axillary lymph node and an abnormal LEFT
axillary lymph node. No apparent complications.

ADDENDUM:
Pathology revealed GRADE II INVASIVE MAMMARY CARCINOMA, MAMMARY
CARCINOMA IN SITU WITH CALCIFICATIONS AND NECROSIS of the RIGHT
breast, upper outer. This was found to be concordant by Dr. Botsoma Ageda.

Pathology revealed INVASIVE MAMMARY CARCINOMA of the RIGHT axilla.
Definitive nodal tissue is not identified. This was found to be
concordant by Dr. Botsoma Ageda.

Pathology revealed BENIGN LYMPH NODE of the LEFT axilla. This was
found to be concordant by Dr. Botsoma Ageda.

Pathology results were discussed with the patient by telephone. The
patient reported doing well after the biopsies with tenderness at
the sites. Post biopsy instructions and care were reviewed and
questions were answered. The patient was encouraged to call The
direct phone number was provided.

The patient was referred to [REDACTED]
[REDACTED] at [REDACTED] on
December 03, 2019.

Recommendation for a bilateral breast MRI for further evaluation of
extent of disease and heterogeneously dense breasts.

Pathology results reported by Yoel Tiger, RN on 11/26/2019.

*** End of Addendum ***
A abnormal RIGHT axillary lymph node was biopsied instead of the
slightly irregular RIGHT axillary mass/lymph node due to
location/technical difficulties in sampling.

EXAM:
ULTRASOUND GUIDED RIGHT BREAST CORE NEEDLE BIOPSY

ULTRASOUND-GUIDED BIOPSY OF A RIGHT AXILLARY LYMPH NODE

ULTRASOUND-GUIDED BIOPSY OF A LEFT AXILLARY LYMPH NODE
PROCEDURE:
I met with the patient and we discussed the procedure of
ultrasound-guided biopsy, including benefits and alternatives. We
discussed the high likelihood of a successful procedure. We
discussed the risks of the procedure, including infection, bleeding,
tissue injury, clip migration, and inadequate sampling. Informed
written consent was given. The usual time-out protocol was performed
immediately prior to the procedure.

ULTRASOUND-GUIDED RIGHT BREAST CORE NEEDLE BIOPSY (RIBBON CLIP):

Lesion quadrant: UPPER-OUTER RIGHT breast

Using sterile technique and 1% Lidocaine as local anesthetic, under
direct ultrasound visualization, a 12 gauge Ricci device was
used to perform biopsy of the 11 cm mass within the UPPER-OUTER
RIGHT breast using a LATERAL approach. At the conclusion of the
procedure a RIBBON tissue marker clip was deployed into the biopsy
cavity. Follow up 2 view mammogram was performed and dictated
separately.

ULTRASOUND-GUIDED CORE BIOPSY OF A RIGHT AXILLARY LYMPH NODE (Q
clip):

Using sterile technique and 1% Lidocaine as local anesthetic, under
direct ultrasound visualization, a 14 gauge Ricci device was
used to perform biopsy of 1 of the abnormal RIGHT axillary lymph
nodes with cortical thickening using a inferolateral approach. At
the conclusion of the procedure a Q tissue marker clip was deployed
into the biopsy cavity, with satisfactory placement confirmed
sonographically.

ULTRASOUND-GUIDED CORE BIOPSY OF A LEFT AXILLARY LYMPH NODE (Q
clip):

Using sterile technique and 1% Lidocaine as local anesthetic, under
direct ultrasound visualization, a 14 gauge Ricci device was
used to perform biopsy of the abnormal LEFT axillary lymph node with
cortical thickening using a MEDIAL approach. At the conclusion of
the procedure a Q tissue marker clip was deployed into the biopsy
cavity, with satisfactory placement confirmed sonographically.
IMPRESSION: Ultrasound guided biopsies of the 11 cm UPPER-OUTER RIGHT breast
mass, an abnormal RIGHT axillary lymph node and an abnormal LEFT
axillary lymph node. No apparent complications.

## 2022-06-07 IMAGING — MG MM BREAST LOCALIZATION CLIP
4 series · 4 of 12 positions shown · non-contrast
Comparison: Previous exam(s).

CLINICAL DATA: Evaluate RIBBON biopsy clip placement following
ultrasound-guided RIGHT breast biopsy. Evaluate Q biopsy clip
placement following ultrasound-guided RIGHT axillary lymph node
biopsy.

EXAM:
3D DIAGNOSTIC RIGHT MAMMOGRAM POST ULTRASOUND BIOPSY

[R ML synth-2D]
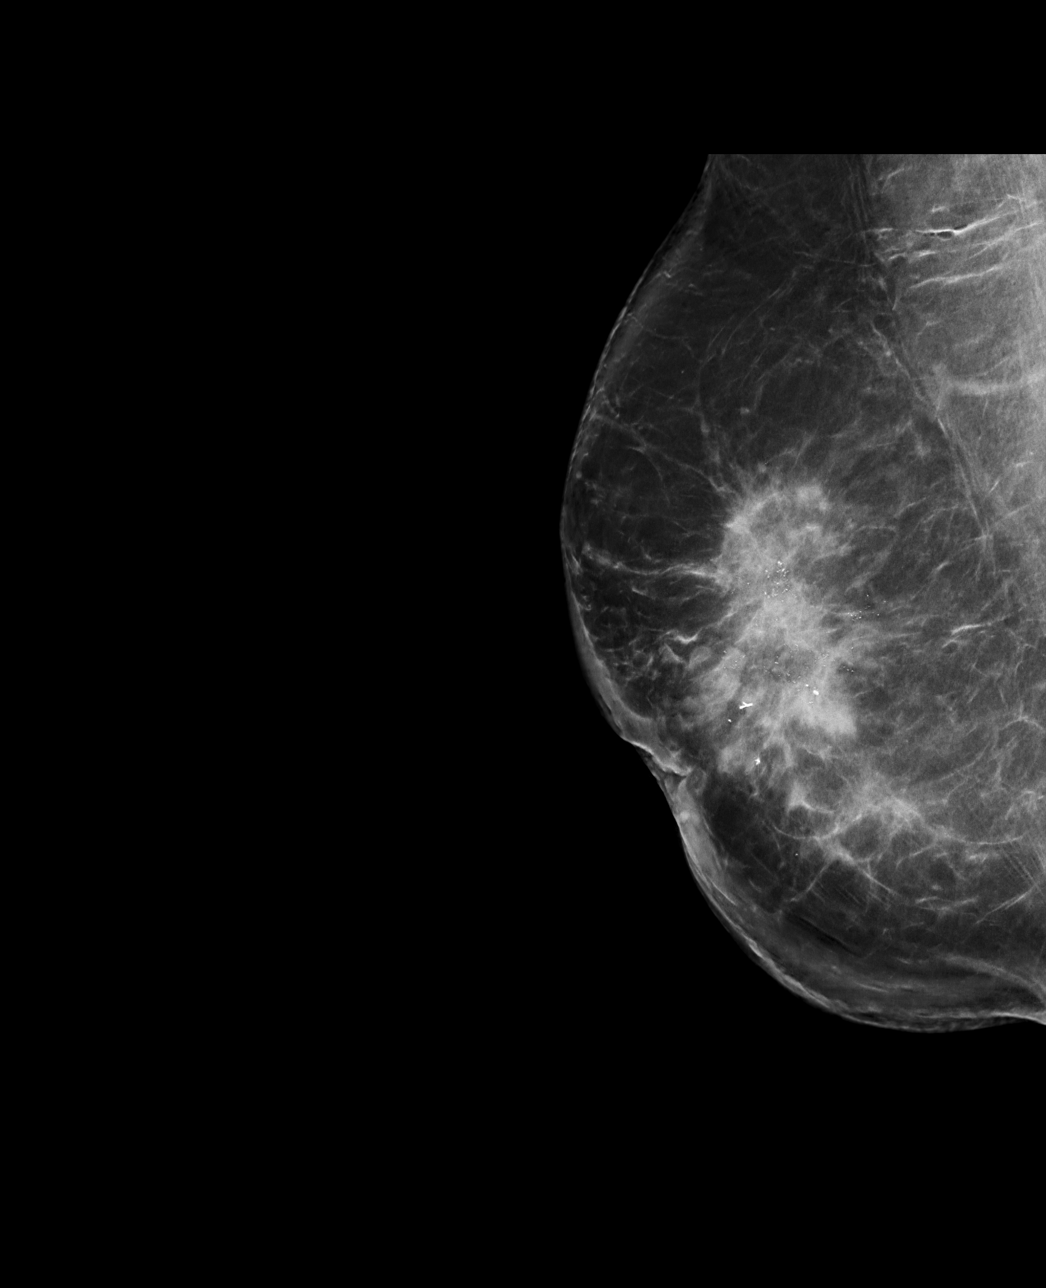

[R CC synth-2D]
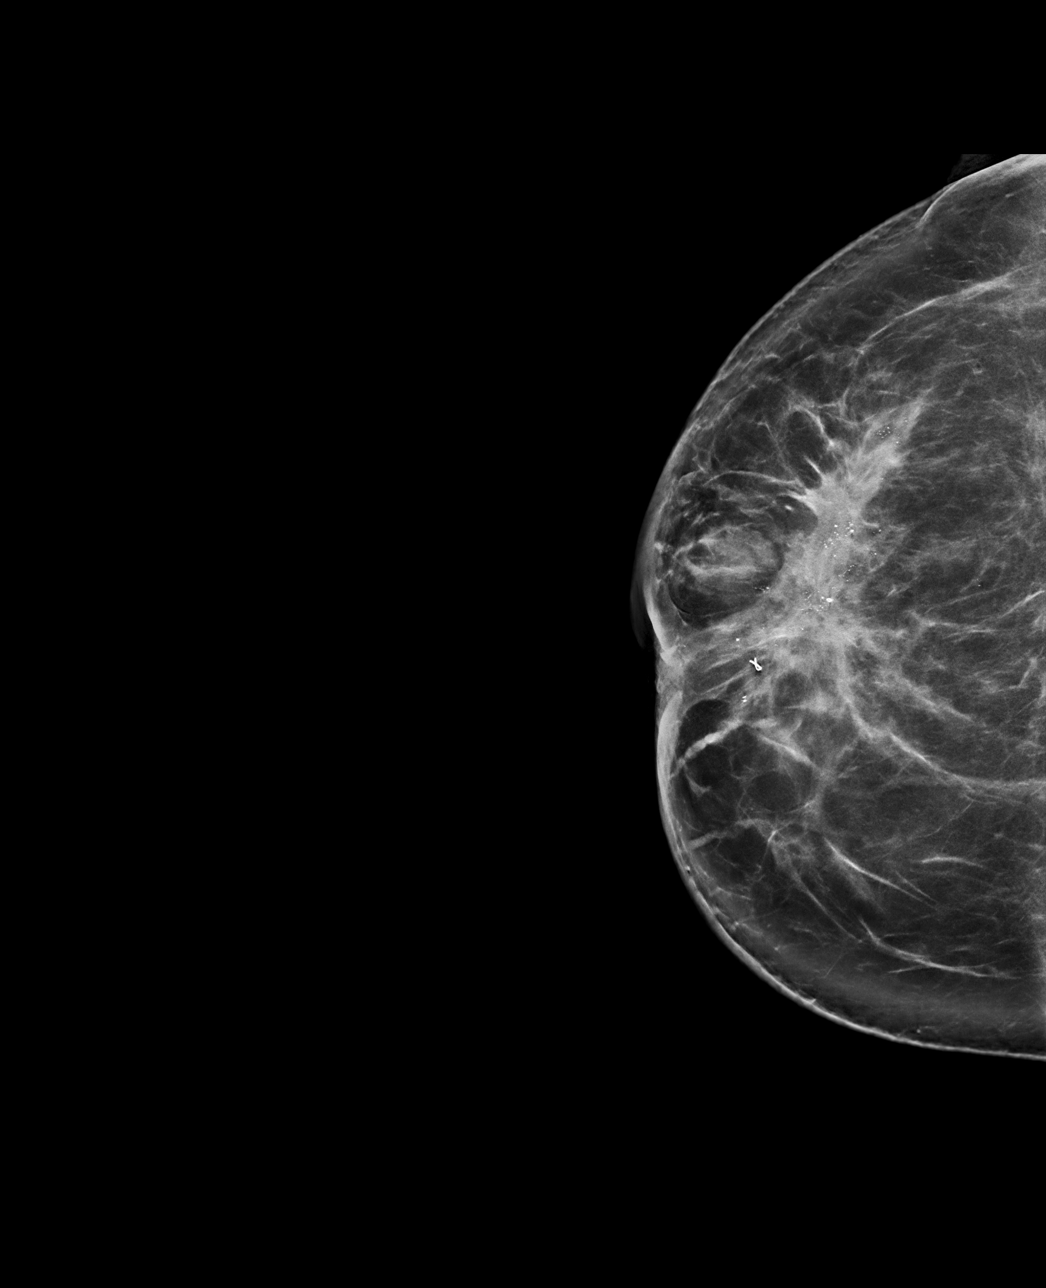

[R ML tomo · tomo slice 57/113.0]
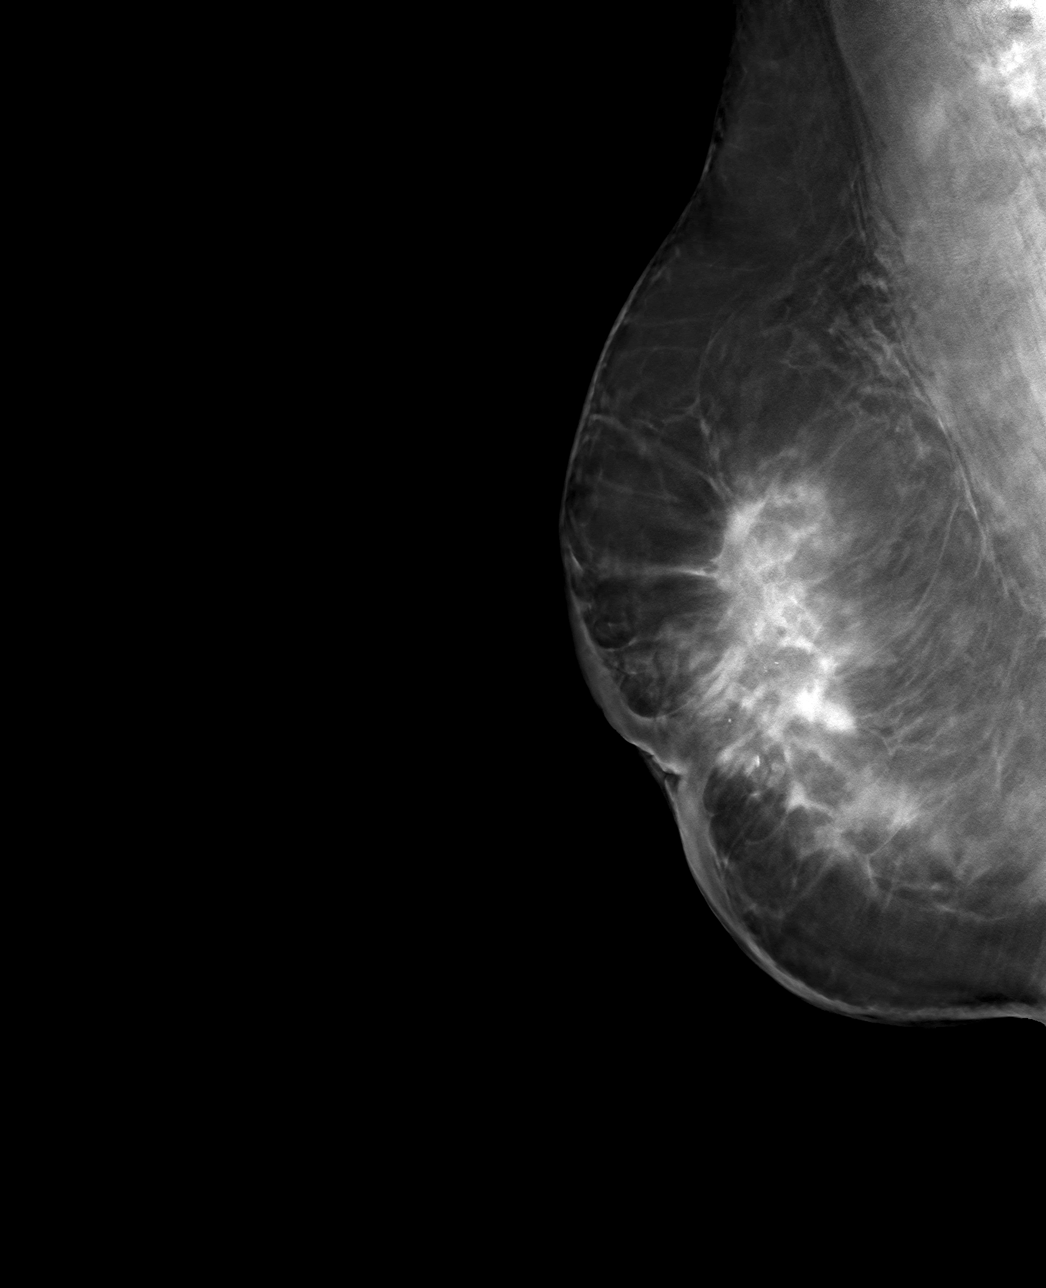

[R CC tomo · tomo slice 52/103.0]
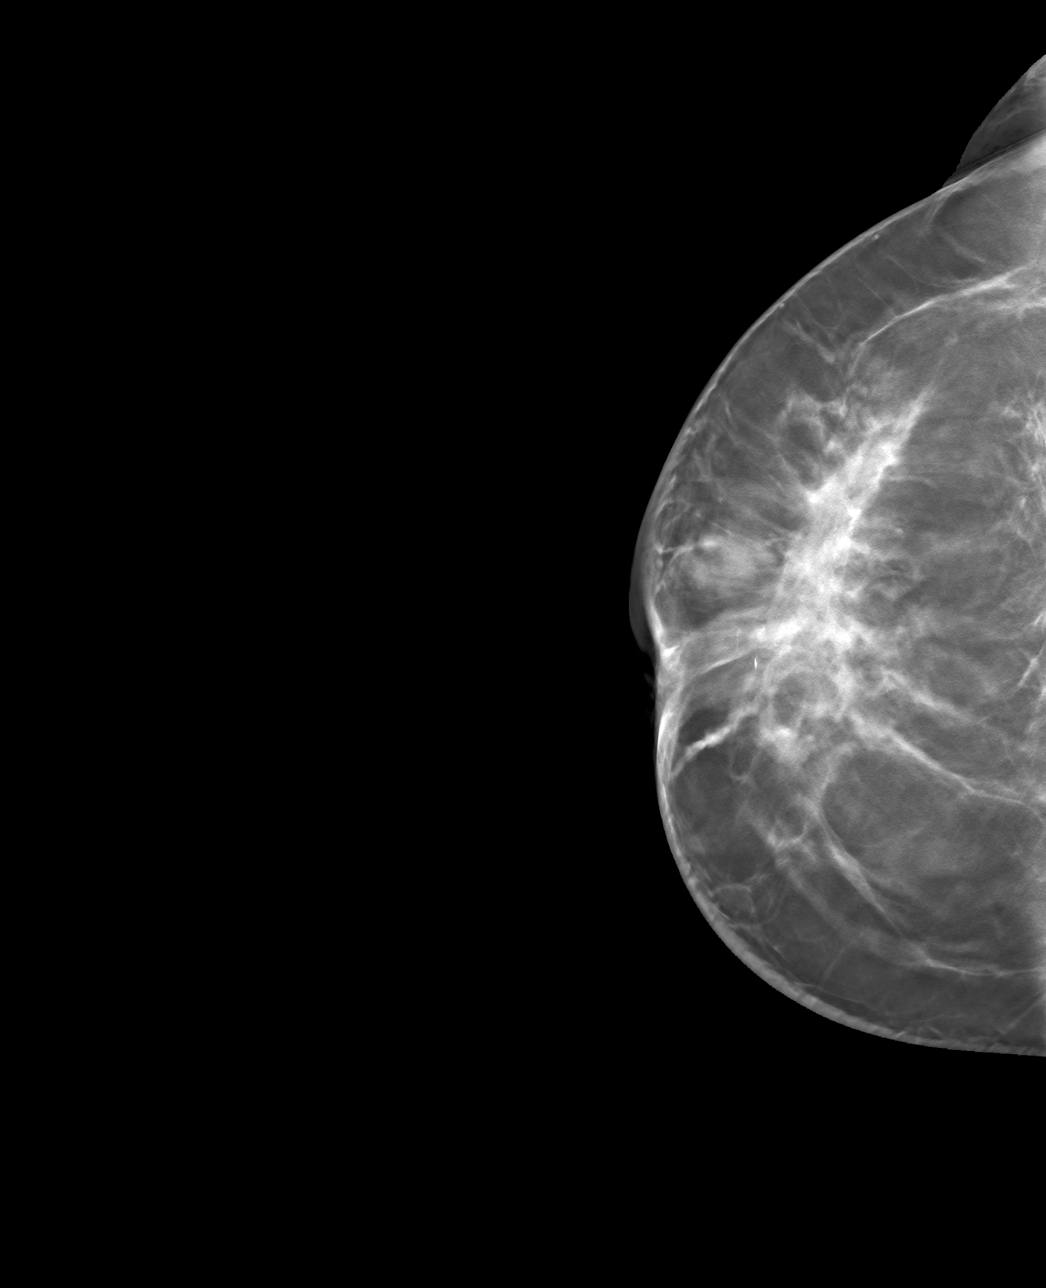

[4 of 12 positions shown; findings below may reference images not displayed]

FINDINGS: 3D Mammographic images were obtained following ultrasound guided
biopsies of a UPPER-OUTER RIGHT breast mass and an abnormal RIGHT
axillary lymph node.

The RIBBON biopsy marking clip is in expected position at the site
of biopsy within the UPPER-OUTER RETROAREOLAR RIGHT breast.

The Q shaped biopsy marking clip is in expected position at the site
of biopsy of a RIGHT axillary lymph node.
IMPRESSION: Appropriate positioning of the RIBBON shaped biopsy marking clip at
the site of biopsy in the UPPER-OUTER RETROAREOLAR RIGHT breast.

Appropriate positioning of the Q shaped biopsy marking clip at the
site of biopsy in the RIGHT axilla.

Please note the due to the location of the biopsied LEFT axillary
lymph node, a LEFT mammogram was not performed. The Q clip was noted
to be in satisfactory position sonographically after the biopsy.

Final Assessment: Post Procedure Mammograms for Marker Placement

## 2022-06-14 ENCOUNTER — Other Ambulatory Visit: Payer: Self-pay

## 2022-06-16 ENCOUNTER — Inpatient Hospital Stay: Payer: Commercial Managed Care - PPO

## 2022-06-16 VITALS — BP 129/76 | HR 79 | Temp 98.2°F | Resp 18 | Ht 67.0 in | Wt 202.0 lb

## 2022-06-16 DIAGNOSIS — C50411 Malignant neoplasm of upper-outer quadrant of right female breast: Secondary | ICD-10-CM

## 2022-06-16 DIAGNOSIS — Z5111 Encounter for antineoplastic chemotherapy: Secondary | ICD-10-CM | POA: Diagnosis not present

## 2022-06-16 MED ORDER — DIPHENHYDRAMINE HCL 25 MG PO CAPS
50.0000 mg | ORAL_CAPSULE | Freq: Once | ORAL | Status: AC
  Administered 2022-06-16: 50 mg via ORAL
  Filled 2022-06-16: qty 2

## 2022-06-16 MED ORDER — PERTUZ-TRASTUZ-HYALURON-ZZXF 60-60-2000 MG-MG-U/ML CHEMO ~~LOC~~ SOLN
10.0000 mL | Freq: Once | SUBCUTANEOUS | Status: AC
  Administered 2022-06-16: 10 mL via SUBCUTANEOUS
  Filled 2022-06-16: qty 10

## 2022-06-16 MED ORDER — ACETAMINOPHEN 325 MG PO TABS
650.0000 mg | ORAL_TABLET | Freq: Once | ORAL | Status: AC
  Administered 2022-06-16: 650 mg via ORAL
  Filled 2022-06-16: qty 2

## 2022-06-16 NOTE — Patient Instructions (Signed)
[facility]   Discharge Instructions: Thank you for choosing [facility] to provide your oncology and hematology care.   If you have a lab appointment with the [facility], please go directly to the [facility] and check in at the registration area.   Wear comfortable clothing and clothing appropriate for easy access to any Portacath or PICC line.   We strive to give you quality time with your provider. You may need to reschedule your appointment if you arrive late (15 or more minutes).  Arriving late affects you and other patients whose appointments are after yours.  Also, if you miss three or more appointments without notifying the office, you may be dismissed from the clinic at the provider's discretion.      For prescription refill requests, have your pharmacy contact our office and allow 72 hours for refills to be completed.    Today you received the following chemotherapy and/or immunotherapy agents Phesgo.      To help prevent nausea and vomiting after your treatment, we encourage you to take your nausea medication as directed.  BELOW ARE SYMPTOMS THAT SHOULD BE REPORTED IMMEDIATELY: *FEVER GREATER THAN 100.4 F (38 C) OR HIGHER *CHILLS OR SWEATING *NAUSEA AND VOMITING THAT IS NOT CONTROLLED WITH YOUR NAUSEA MEDICATION *UNUSUAL SHORTNESS OF BREATH *UNUSUAL BRUISING OR BLEEDING *URINARY PROBLEMS (pain or burning when urinating, or frequent urination) *BOWEL PROBLEMS (unusual diarrhea, constipation, pain near the anus) TENDERNESS IN MOUTH AND THROAT WITH OR WITHOUT PRESENCE OF ULCERS (sore throat, sores in mouth, or a toothache) UNUSUAL RASH, SWELLING OR PAIN  UNUSUAL VAGINAL DISCHARGE OR ITCHING   Items with * indicate a potential emergency and should be followed up as soon as possible or go to the Emergency Department if any problems should occur.  Please show the CHEMOTHERAPY ALERT CARD or IMMUNOTHERAPY ALERT CARD at check-in  to the Emergency Department and triage nurse.  Should you have questions after your visit or need to cancel or reschedule your appointment, please contact [facility]  Dept: [phone]  and follow the prompts.  Office hours are 8:00 a.m. to 4:30 p.m. Monday - Friday. Please note that voicemails left after 4:00 p.m. may not be returned until the following business day.  We are closed weekends and major holidays. You have access to a nurse at all times for urgent questions. Please call the main number to the clinic Dept: [phone] and follow the prompts.   For any non-urgent questions, you may also contact your provider using MyChart. We now offer e-Visits for anyone 20 and older to request care online for non-urgent symptoms. For details visit [email or web address].   Also download the MyChart app! Go to the app store, search "MyChart", open the app, select [facility], and log in with your MyChart username and password.  Pertuzumab; Trastuzumab; Hyaluronidase Injection What is this medication? PERTUZUMAB; TRASTUZUMAB; HYALURONIDASE (per TOOZ ue mab; tras TOO zoo mab; hye al ur ON i dase) treats breast cancer. Pertuzumab and trastuzumab work by blocking a protein that causes cancer cells to grow and multiply. This helps to slow or stop the spread of cancer cells. Hyaluronidase works by increasing the absorption of other medications in the body to help them work better. It is a combination medication that contains two monoclonal antibodies. This medicine may be used for other purposes; ask your health care provider or pharmacist if you have questions. COMMON BRAND NAME(S): PHESGO What should  I tell my care team before I take this medication? They need to know if you have any of these conditions: Heart failure High blood pressure Irregular heartbeat or rhythm Lung disease An unusual or allergic reaction to pertuzumab, trastuzumab, hyaluronidase, other  medications, foods, dyes, or preservatives Pregnant or trying to get pregnant Breast-feeding How should I use this medication? This medication is injected under the skin. It is usually given by your care team in a hospital or clinic setting. It may also be given at home by your care team. Talk to your care team about the use of this medication in children. Special care may be needed. Overdosage: If you think you have taken too much of this medicine contact a poison control center or emergency room at once. NOTE: This medicine is only for you. Do not share this medicine with others. What if I miss a dose? Keep appointments for follow-up doses. It is important not to miss your dose. Call your care team if you are unable to keep an appointment. What may interact with this medication? Certain types of chemotherapy, such as daunorubicin, doxorubicin, epirubicin, idarubicin This list may not describe all possible interactions. Give your health care provider a list of all the medicines, herbs, non-prescription drugs, or dietary supplements you use. Also tell them if you smoke, drink alcohol, or use illegal drugs. Some items may interact with your medicine. What should I watch for while using this medication? Your condition will be monitored carefully while you are receiving this medication. This medication may make you feel generally unwell. This is not uncommon as chemotherapy can affect healthy cells as well as cancer cells. Report any side effects. Continue your course of treatment even though you feel ill unless your care team tells you to stop. This medication may increase your risk of getting an infection. Call your care team for advice if you get a fever, chills, sore throat, or other symptoms of a cold or flu. Do not treat yourself. Try to avoid being around people who are sick. Avoid taking medications that contain aspirin, acetaminophen, ibuprofen, naproxen, or ketoprofen unless instructed by  your care team. These medications may hide a fever. Talk to your care team if you may be pregnant. Serious birth defects can occur if you take this medication during pregnancy and for 7 months after the last dose. You will need a negative pregnancy test before starting this medication. Contraception is recommended while taking this medication and for 7 months after the last dose. Your care team can help you find the option that works for you. Do not breastfeed while taking this medication and for 7 months after the last dose. What side effects may I notice from receiving this medication? Side effects that you should report to your care team as soon as possible: Allergic reactions or angioedema--skin rash, itching or hives, swelling of the face, eyes, lips, tongue, arms, or legs, trouble swallowing or breathing Dry cough, shortness of breath or trouble breathing Heart failure--shortness of breath, swelling of the ankles, feet, or hands, sudden weight gain, unusual weakness or fatigue Infection--fever, chills, cough, or sore throat Infusion reactions--chest pain, shortness of breath or trouble breathing, feeling faint or lightheaded Side effects that usually do not require medical attention (report these to your care team if they continue or are bothersome): Diarrhea Hair loss Nausea Pain, redness, or irritation at injection site Pain, redness, or swelling with sores inside the mouth or throat Unusual weakness or fatigue This list may  not describe all possible side effects. Call your doctor for medical advice about side effects. You may report side effects to FDA at 1-800-FDA-1088. Where should I keep my medication? This medication is given in a hospital or clinic. It will not be stored at home. NOTE: This sheet is a summary. It may not cover all possible information. If you have questions about this medicine, talk to your doctor, pharmacist, or health care provider.  2023 Elsevier/Gold Standard  (2021-08-11 00:00:00)

## 2022-06-28 ENCOUNTER — Other Ambulatory Visit: Payer: Self-pay | Admitting: Hematology

## 2022-06-28 ENCOUNTER — Other Ambulatory Visit: Payer: Self-pay | Admitting: Hematology and Oncology

## 2022-06-28 DIAGNOSIS — Z170 Estrogen receptor positive status [ER+]: Secondary | ICD-10-CM

## 2022-06-28 NOTE — Progress Notes (Signed)
[facility] - Breast  No Change  Continue With Treatment as Ordered.  Original Decision Date/Time: 12/03/2019 21:39     A cycle is every 21 days:     Pertuzumab      Pertuzumab      Trastuzumab-xxxx      Trastuzumab-xxxx      Carboplatin      Docetaxel   **Always confirm dose/schedule in your pharmacy ordering system**  Patient Characteristics: Preoperative or Nonsurgical Candidate (Clinical Staging), Neoadjuvant Therapy followed by Surgery, Invasive Disease, Chemotherapy, HER2 Positive, ER Positive Therapeutic Status: Preoperative or Nonsurgical Candidate (Clinical Staging) AJCC M Category: cM0 AJCC Grade: G2 Breast Surgical Plan: Neoadjuvant Therapy followed by Surgery ER Status: Positive (+) AJCC 8 Stage Grouping: IIA HER2 Status: Positive (+) AJCC T Category: cT3 AJCC N Category: cN1 PR Status: Positive (+) Intent of Therapy: Curative Intent, Discussed with Patient

## 2022-07-07 ENCOUNTER — Encounter: Payer: Self-pay | Admitting: Hematology

## 2022-07-07 ENCOUNTER — Inpatient Hospital Stay: Payer: Commercial Managed Care - PPO

## 2022-07-07 ENCOUNTER — Inpatient Hospital Stay (HOSPITAL_BASED_OUTPATIENT_CLINIC_OR_DEPARTMENT_OTHER): Payer: Commercial Managed Care - PPO | Admitting: Hematology

## 2022-07-07 ENCOUNTER — Inpatient Hospital Stay: Payer: Commercial Managed Care - PPO | Attending: Hematology

## 2022-07-07 VITALS — BP 118/67 | HR 82 | Temp 98.7°F | Resp 16 | Ht 67.0 in | Wt 201.0 lb

## 2022-07-07 DIAGNOSIS — I427 Cardiomyopathy due to drug and external agent: Secondary | ICD-10-CM | POA: Diagnosis not present

## 2022-07-07 DIAGNOSIS — Z79811 Long term (current) use of aromatase inhibitors: Secondary | ICD-10-CM | POA: Insufficient documentation

## 2022-07-07 DIAGNOSIS — C50411 Malignant neoplasm of upper-outer quadrant of right female breast: Secondary | ICD-10-CM

## 2022-07-07 DIAGNOSIS — C7951 Secondary malignant neoplasm of bone: Secondary | ICD-10-CM

## 2022-07-07 DIAGNOSIS — G629 Polyneuropathy, unspecified: Secondary | ICD-10-CM | POA: Insufficient documentation

## 2022-07-07 DIAGNOSIS — I10 Essential (primary) hypertension: Secondary | ICD-10-CM | POA: Diagnosis not present

## 2022-07-07 DIAGNOSIS — Z170 Estrogen receptor positive status [ER+]: Secondary | ICD-10-CM | POA: Insufficient documentation

## 2022-07-07 DIAGNOSIS — T451X5A Adverse effect of antineoplastic and immunosuppressive drugs, initial encounter: Secondary | ICD-10-CM

## 2022-07-07 DIAGNOSIS — Z5111 Encounter for antineoplastic chemotherapy: Secondary | ICD-10-CM | POA: Insufficient documentation

## 2022-07-07 LAB — CBC WITH DIFFERENTIAL (CANCER CENTER ONLY)
Abs Immature Granulocytes: 0.01 10*3/uL (ref 0.00–0.07)
Basophils Absolute: 0 10*3/uL (ref 0.0–0.1)
Basophils Relative: 0 %
Eosinophils Absolute: 0.2 10*3/uL (ref 0.0–0.5)
Eosinophils Relative: 3 %
HCT: 38.1 % (ref 36.0–46.0)
Hemoglobin: 12.8 g/dL (ref 12.0–15.0)
Immature Granulocytes: 0 %
Lymphocytes Relative: 40 %
Lymphs Abs: 2.6 10*3/uL (ref 0.7–4.0)
MCH: 30 pg (ref 26.0–34.0)
MCHC: 33.6 g/dL (ref 30.0–36.0)
MCV: 89.4 fL (ref 80.0–100.0)
Monocytes Absolute: 0.6 10*3/uL (ref 0.1–1.0)
Monocytes Relative: 9 %
Neutro Abs: 3.1 10*3/uL (ref 1.7–7.7)
Neutrophils Relative %: 48 %
Platelet Count: 293 10*3/uL (ref 150–400)
RBC: 4.26 MIL/uL (ref 3.87–5.11)
RDW: 14 % (ref 11.5–15.5)
WBC Count: 6.6 10*3/uL (ref 4.0–10.5)
nRBC: 0 % (ref 0.0–0.2)

## 2022-07-07 LAB — CMP (CANCER CENTER ONLY)
ALT: 8 U/L (ref 0–44)
AST: 14 U/L — ABNORMAL LOW (ref 15–41)
Albumin: 4.2 g/dL (ref 3.5–5.0)
Alkaline Phosphatase: 102 U/L (ref 38–126)
Anion gap: 4 — ABNORMAL LOW (ref 5–15)
BUN: 15 mg/dL (ref 8–23)
CO2: 27 mmol/L (ref 22–32)
Calcium: 8.9 mg/dL (ref 8.9–10.3)
Chloride: 111 mmol/L (ref 98–111)
Creatinine: 0.76 mg/dL (ref 0.44–1.00)
GFR, Estimated: >60 mL/min (ref >60)
Glucose, Bld: 130 mg/dL — ABNORMAL HIGH (ref 70–99)
Potassium: 3.5 mmol/L (ref 3.5–5.1)
Sodium: 142 mmol/L (ref 135–145)
Total Bilirubin: 0.4 mg/dL (ref 0.3–1.2)
Total Protein: 7.1 g/dL (ref 6.5–8.1)

## 2022-07-07 MED ORDER — PROCHLORPERAZINE MALEATE 10 MG PO TABS: 10.0000 mg | ORAL_TABLET | Freq: Once | ORAL | Status: DC

## 2022-07-07 MED ORDER — ACETAMINOPHEN 325 MG PO TABS
650.0000 mg | ORAL_TABLET | Freq: Once | ORAL | Status: AC
  Administered 2022-07-07: 650 mg via ORAL
  Filled 2022-07-07: qty 2

## 2022-07-07 MED ORDER — PERTUZ-TRASTUZ-HYALURON-ZZXF 60-60-2000 MG-MG-U/ML CHEMO ~~LOC~~ SOLN
10.0000 mL | Freq: Once | SUBCUTANEOUS | Status: AC
  Administered 2022-07-07: 10 mL via SUBCUTANEOUS
  Filled 2022-07-07: qty 10

## 2022-07-07 MED ORDER — GABAPENTIN 100 MG PO CAPS: ORAL_CAPSULE | ORAL | 2 refills | Status: DC

## 2022-07-07 MED ORDER — LETROZOLE 2.5 MG PO TABS: 2.5000 mg | ORAL_TABLET | Freq: Every day | ORAL | 1 refills | Status: DC

## 2022-07-07 MED ORDER — POTASSIUM CHLORIDE CRYS ER 10 MEQ PO TBCR: 10.0000 meq | EXTENDED_RELEASE_TABLET | Freq: Every day | ORAL | 1 refills | Status: DC

## 2022-07-07 MED ORDER — HEPARIN SOD (PORK) LOCK FLUSH 100 UNIT/ML IV SOLN
500.0000 [IU] | Freq: Once | INTRAVENOUS | Status: AC
  Administered 2022-07-07: 500 [IU]

## 2022-07-07 MED ORDER — DIPHENHYDRAMINE HCL 25 MG PO CAPS
50.0000 mg | ORAL_CAPSULE | Freq: Once | ORAL | Status: AC
  Administered 2022-07-07: 50 mg via ORAL
  Filled 2022-07-07: qty 2

## 2022-07-07 MED ORDER — SODIUM CHLORIDE 0.9% FLUSH
10.0000 mL | Freq: Once | INTRAVENOUS | Status: AC | PRN
  Administered 2022-07-07: 10 mL

## 2022-07-07 NOTE — Assessment & Plan Note (Signed)
-  from breast cancer  --bone scan on 12/10/19 showed metastatic disease in left acetabulum, L3, and right 10th rib.  --Zometa started 06/18/20, but held after second dose due to loss of insurance. -will restart since she has insurance now  

## 2022-07-07 NOTE — Patient Instructions (Signed)
[facility]   Discharge Instructions: Thank you for choosing [facility] to provide your oncology and hematology care.   If you have a lab appointment with the [facility], please go directly to the [facility] and check in at the registration area.   Wear comfortable clothing and clothing appropriate for easy access to any Portacath or PICC line.   We strive to give you quality time with your provider. You may need to reschedule your appointment if you arrive late (15 or more minutes).  Arriving late affects you and other patients whose appointments are after yours.  Also, if you miss three or more appointments without notifying the office, you may be dismissed from the clinic at the provider's discretion.      For prescription refill requests, have your pharmacy contact our office and allow 72 hours for refills to be completed.    Today you received the following chemotherapy and/or immunotherapy agents Phesgo.      To help prevent nausea and vomiting after your treatment, we encourage you to take your nausea medication as directed.  BELOW ARE SYMPTOMS THAT SHOULD BE REPORTED IMMEDIATELY: *FEVER GREATER THAN 100.4 F (38 C) OR HIGHER *CHILLS OR SWEATING *NAUSEA AND VOMITING THAT IS NOT CONTROLLED WITH YOUR NAUSEA MEDICATION *UNUSUAL SHORTNESS OF BREATH *UNUSUAL BRUISING OR BLEEDING *URINARY PROBLEMS (pain or burning when urinating, or frequent urination) *BOWEL PROBLEMS (unusual diarrhea, constipation, pain near the anus) TENDERNESS IN MOUTH AND THROAT WITH OR WITHOUT PRESENCE OF ULCERS (sore throat, sores in mouth, or a toothache) UNUSUAL RASH, SWELLING OR PAIN  UNUSUAL VAGINAL DISCHARGE OR ITCHING   Items with * indicate a potential emergency and should be followed up as soon as possible or go to the Emergency Department if any problems should occur.  Please show the CHEMOTHERAPY ALERT CARD or IMMUNOTHERAPY ALERT CARD at  check-in to the Emergency Department and triage nurse.  Should you have questions after your visit or need to cancel or reschedule your appointment, please contact [facility]  Dept: [phone]  and follow the prompts.  Office hours are 8:00 a.m. to 4:30 p.m. Monday - Friday. Please note that voicemails left after 4:00 p.m. may not be returned until the following business day.  We are closed weekends and major holidays. You have access to a nurse at all times for urgent questions. Please call the main number to the clinic Dept: [phone] and follow the prompts.   For any non-urgent questions, you may also contact your provider using MyChart. We now offer e-Visits for anyone 90 and older to request care online for non-urgent symptoms. For details visit [email or web address].   Also download the MyChart app! Go to the app store, search "MyChart", open the app, select [facility], and log in with your MyChart username and password.  Pertuzumab; Trastuzumab; Hyaluronidase Injection What is this medication? PERTUZUMAB; TRASTUZUMAB; HYALURONIDASE (per TOOZ ue mab; tras TOO zoo mab; hye al ur ON i dase) treats breast cancer. Pertuzumab and trastuzumab work by blocking a protein that causes cancer cells to grow and multiply. This helps to slow or stop the spread of cancer cells. Hyaluronidase works by increasing the absorption of other medications in the body to help them work better. It is a combination medication that contains two monoclonal antibodies. This medicine may be used for other purposes; ask your health care provider or pharmacist if you have questions. COMMON BRAND NAME(S): PHESGO  What should I tell my care team before I take this medication? They need to know if you have any of these conditions: Heart failure High blood pressure Irregular heartbeat or rhythm Lung disease An unusual or allergic reaction to pertuzumab, trastuzumab, hyaluronidase,  other medications, foods, dyes, or preservatives Pregnant or trying to get pregnant Breast-feeding How should I use this medication? This medication is injected under the skin. It is usually given by your care team in a hospital or clinic setting. It may also be given at home by your care team. Talk to your care team about the use of this medication in children. Special care may be needed. Overdosage: If you think you have taken too much of this medicine contact a poison control center or emergency room at once. NOTE: This medicine is only for you. Do not share this medicine with others. What if I miss a dose? Keep appointments for follow-up doses. It is important not to miss your dose. Call your care team if you are unable to keep an appointment. What may interact with this medication? Certain types of chemotherapy, such as daunorubicin, doxorubicin, epirubicin, idarubicin This list may not describe all possible interactions. Give your health care provider a list of all the medicines, herbs, non-prescription drugs, or dietary supplements you use. Also tell them if you smoke, drink alcohol, or use illegal drugs. Some items may interact with your medicine. What should I watch for while using this medication? Your condition will be monitored carefully while you are receiving this medication. This medication may make you feel generally unwell. This is not uncommon as chemotherapy can affect healthy cells as well as cancer cells. Report any side effects. Continue your course of treatment even though you feel ill unless your care team tells you to stop. This medication may increase your risk of getting an infection. Call your care team for advice if you get a fever, chills, sore throat, or other symptoms of a cold or flu. Do not treat yourself. Try to avoid being around people who are sick. Avoid taking medications that contain aspirin, acetaminophen, ibuprofen, naproxen, or ketoprofen unless instructed  by your care team. These medications may hide a fever. Talk to your care team if you may be pregnant. Serious birth defects can occur if you take this medication during pregnancy and for 7 months after the last dose. You will need a negative pregnancy test before starting this medication. Contraception is recommended while taking this medication and for 7 months after the last dose. Your care team can help you find the option that works for you. Do not breastfeed while taking this medication and for 7 months after the last dose. What side effects may I notice from receiving this medication? Side effects that you should report to your care team as soon as possible: Allergic reactions or angioedema--skin rash, itching or hives, swelling of the face, eyes, lips, tongue, arms, or legs, trouble swallowing or breathing Dry cough, shortness of breath or trouble breathing Heart failure--shortness of breath, swelling of the ankles, feet, or hands, sudden weight gain, unusual weakness or fatigue Infection--fever, chills, cough, or sore throat Infusion reactions--chest pain, shortness of breath or trouble breathing, feeling faint or lightheaded Side effects that usually do not require medical attention (report these to your care team if they continue or are bothersome): Diarrhea Hair loss Nausea Pain, redness, or irritation at injection site Pain, redness, or swelling with sores inside the mouth or throat Unusual weakness or fatigue This  list may not describe all possible side effects. Call your doctor for medical advice about side effects. You may report side effects to FDA at 1-800-FDA-1088. Where should I keep my medication? This medication is given in a hospital or clinic. It will not be stored at home. NOTE: This sheet is a summary. It may not cover all possible information. If you have questions about this medicine, talk to your doctor, pharmacist, or health care provider.  2023 Elsevier/Gold  Standard (2021-08-11 00:00:00)

## 2022-07-07 NOTE — Assessment & Plan Note (Deleted)
-  stage IV [phone]), with bone and possible liver mets,  ER+/PR+/HER2+, Grade II-III   --diagnosed in 11/2019 with large right breast mass measuring up to 11cm and right lymphadenopathy. 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 Left axillary node biopsy was benign.  -01/01/20 Liver biopsy was negative but 01/12/20 Bone Biopsy was positive for metastatic carcinoma from primary breast cancer. ER/PR/HER2 were all negative, possibly related to decalcification. -She received THP 01/03/20 - 2/3/2 -on maintenance HP Phesgo injection every 3 weeks -last staging scan in 08/2020, not repeated due to lack of insurance and she is clinically doing well  -repeated CT and bone scan 05/05/2022 showed good partial response in liver, minimum uptake in bone mets, no concerns for new lesion or evidence of progression.  I personally reviewed his scan images and discussed with patient. -Will continue Phesgo injection every 3 weeks and letrozole, she is tolerating well

## 2022-07-07 NOTE — Assessment & Plan Note (Signed)
-  stage IV [phone]), with bone and possible liver mets,  ER+/PR+/HER2+, Grade II-III   --diagnosed in 11/2019 with large right breast mass measuring up to 11cm and right lymphadenopathy. 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 Left axillary node biopsy was benign.  -01/01/20 Liver biopsy was negative but 01/12/20 Bone Biopsy was positive for metastatic carcinoma from primary breast cancer. ER/PR/HER2 were all negative, possibly related to decalcification. -She received THP 01/03/20 - 2/3/2 -on maintenance HP Phesgo injection every 3 weeks -last staging scan in 08/2020, not repeated due to lack of insurance and she is clinically doing well  -repeated CT and bone scan 05/05/2022 showed good partial response in liver, minimum uptake in bone mets, no concerns for new lesion or evidence of progression.  I personally reviewed his scan images and discussed with patient. -Will continue Phesgo injection every 3 weeks and letrozole, she is tolerating well

## 2022-07-07 NOTE — Assessment & Plan Note (Signed)
--  echo 09/22/20 showed decreased EF. She was started on losartan by Dr. Bensimhon on 10/21/20 and EF improved. -most recent echo on 01/26/22 was stable.  

## 2022-07-07 NOTE — Progress Notes (Signed)
Patient waited 15 minute post iron infusion with no issues.

## 2022-07-07 NOTE — Progress Notes (Signed)
[place]   [phone] Fax:([phone]) [phone]   Clinic Follow up Note   Patient Care Team: Julian Hy, PA-C as PCP - General (Physician Assistant) [name], MD as Consulting Physician (General Surgery) Truitt Merle, MD as Consulting Physician (Hematology) Kyung Rudd, MD as Consulting Physician (Radiation Oncology) Mauro Kaufmann, RN as Oncology Nurse Navigator Rockwell Germany, RN as Oncology Nurse Navigator  Date of Service:  07/07/2022  CHIEF COMPLAINT: f/u of  metastatic breast cancer   CURRENT THERAPY:  -Maintenance Herceptin and Perjeta (Phesgo) q3weeks starting 06/18/20, held 09/22/20-11/24/20 due to low EF -Letrozole 2.5mg  daily starting 06/18/20   ASSESSMENT:  Carolyn Stewart is a 62 y.o. female with   Malignant neoplasm of upper-outer quadrant of right breast in female, estrogen receptor positive ([place]) -stage IV [phone]), with bone and possible liver mets,  ER+/PR+/HER2+, Grade II-III   --diagnosed in 11/2019 with large right breast mass measuring up to 11cm and right lymphadenopathy. 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 Left axillary node biopsy was benign.  -01/01/20 Liver biopsy was negative but 01/12/20 Bone Biopsy was positive for metastatic carcinoma from primary breast cancer. ER/PR/HER2 were all negative, possibly related to decalcification. -She received THP 01/03/20 - 2/3/2 -on maintenance HP Phesgo injection every 3 weeks -last staging scan in 08/2020, not repeated due to lack of insurance and she is clinically doing well  -repeated CT and bone scan 05/05/2022 showed good partial response in liver, minimum uptake in bone mets, no concerns for new lesion or evidence of progression.  I personally reviewed his scan images and discussed with patient. -Will continue Phesgo injection every 3 weeks and letrozole, she is tolerating well   Metastasis to bone ([place]) -from breast cancer   --bone scan on 12/10/19 showed metastatic disease in left acetabulum, L3, and right 10th rib.  --Zometa started 06/18/20, but held after second dose due to loss of insurance. -will restart since she has insurance now   Chemotherapy induced cardiomyopathy ([place]) -echo 09/22/20 showed decreased EF. She was started on losartan by Dr. Haroldine Laws on 10/21/20 and EF improved. -most recent echo on 01/26/22 was stable.        PLAN: -repeat scan in July -lab reviewed -I refill, Letrozole, Gabapentin, and  Potassium -treament at [facility] for next 2 cycles  -lab and f/u with me in 9 weeks - Order  CT  scan for July on next visit -she does echo every 6 month with Dr. Haroldine Laws now, next due in April, she will call for appointment     SUMMARY OF ONCOLOGIC HISTORY: Oncology History Overview Note  Cancer Staging Malignant neoplasm of upper-outer quadrant of right breast in female, estrogen receptor positive ([place]) Staging form: Breast, AJCC 8th Edition - Clinical stage from 11/25/2019: Stage IIA (cT3, cN1, cM0, G2, ER+, PR+, HER2+) - Signed by Truitt Merle, MD on 12/03/2019    Malignant neoplasm of upper-outer quadrant of right breast in female, estrogen receptor positive ([place])  11/13/2019 Mammogram    Diagnostic Mammogram 11/13/19  IMPRESSION: -Highly suspicious mass and calcifications in the right breast centered between 9 and 12 o'clock spanning up to 11 cm.  -Multiple masses in the right axilla. One of the masses contains calcifications, denoted as mass number 2 measuring 9 x 11 by 10 mm. Several of these masses do not appear to represent definitive lymph nodes. Others may be small but abnormal appearing lymph nodes. Taken in total, a cluster of masses  in the right axilla spans up to 3.7 cm.  -There are fibrocystic changes on the left. There are also some solid-appearing masses. A solid appearing mass at 10:30, 6 cm from the nipple measures 9 x 6 x 6 mm. An adjacent solid mass at 10 o'clock, 6 cm from  the nipple measures 8 x 6 by 5 mm. Another solid-appearing mass at 11 o'clock, 1 cm from the nipple measures 5 x 4 by 5 mm. Fibrocystic changes are seen at 10 o'clock, 12 o'clock, and 4 o'clock. Another solid-appearing mass seen at 5 o'clock, 4 cm from the nipple measuring 6 x 3 x 4 mm. There is a single abnormal node in the left axilla with a cortex measuring 5 mm and restriction of the fatty hilum.   11/25/2019 Initial Biopsy   Diagnosis 1. Breast, right, needle core biopsy, upper outer - INVASIVE MAMMARY CARCINOMA - MAMMARY CARCINOMA IN SITU WITH CALCIFICATIONS AND NECROSIS - SEE COMMENT 2. Lymph node, needle/core biopsy, right axilla - INVASIVE MAMMARY CARCINOMA - SEE COMMENT 3. Lymph node, needle/core biopsy, left axilla - BENIGN LYMPH NODE - NO CARCINOMA IDENTIFIED Microscopic Comment 1. The biopsy material shows an infiltrative proliferation of cells arranged linearly and in small clusters. Based on the biopsy, the carcinoma appears Nottingham grade 2 of 3 and measures 1.2 cm in greatest linear extent. E-cadherin and prognostic markers (ER/PR/ki-67/HER2)are pending and will be reported in an addendum. Dr. Tresa Moore reviewed the case and agrees with the above diagnosis. These results were called to [facility] on November 26, 2019. 2. Definitive nodal tissue is not identified. This biopsy has a slightly different architecture morphology than what is seen in part 1. The carcinoma measures 0.7 cm in greatest dimension. E-cadherin and prognostic markers (ER, PR, Ki-67 and HER-2) are pending and will be reported in an addendum.   11/25/2019 Receptors her2   1. PROGNOSTIC INDICATORS Results: IMMUNOHISTOCHEMICAL AND MORPHOMETRIC ANALYSIS PERFORMED MANUALLY The tumor cells are POSITIVE for Her2 (3+). Estrogen Receptor: 90%, POSITIVE, STRONG STAINING INTENSITY Progesterone Receptor: 20%, POSITIVE, STRONG STAINING INTENSITY Proliferation Marker Ki67: 30%  2. PROGNOSTIC  INDICATORS Results: IMMUNOHISTOCHEMICAL AND MORPHOMETRIC ANALYSIS PERFORMED MANUALLY The tumor cells are POSITIVE for Her2 (3+). Estrogen Receptor: 90%, POSITIVE, STRONG STAINING INTENSITY Progesterone Receptor: 25%, POSITIVE, STRONG STAINING INTENSITY Proliferation Marker Ki67: 30%   11/25/2019 Cancer Staging   Staging form: Breast, AJCC 8th Edition - Clinical stage from 11/25/2019: Stage IIA (cT3, cN1, cM0, G2, ER+, PR+, HER2+) - Signed by Truitt Merle, MD on 12/03/2019   12/01/2019 Initial Diagnosis   Malignant neoplasm of upper-outer quadrant of right breast in female, estrogen receptor positive ([place])   12/10/2019 Imaging   Bone Scan  IMPRESSION: Findings concerning for bony metastatic disease in the left acetabulum region, L3 vertebral body region, and anterolateral right tenth rib.   Increased uptake in several large joints likely is of arthropathic etiology.   12/11/2019 Imaging   CT CAP w contrast  IMPRESSION: Prominent glandular tissue in the central right breast with nipple retraction, likely corresponding to the patient's known right breast neoplasm.   Prominent right axillary nodes measuring up to 8 mm short axis, suspicious for nodal metastases in this clinical context.   3 mm subpleural pulmonary nodule in the right lower lobe, likely benign. However, follow-up CT chest is suggested in 3-6 months.   Multiple hepatic lesions, including a dominant 2.4 cm lesion in segment 6, which is considered indeterminate. Metastasis is not excluded. Consider MRI abdomen with/without contrast  for further characterization.   11 mm sclerotic lesion along the left posterior aspect of the T7 vertebral body raises concern for isolated osseous metastasis. Correlate with priors if available. Benign vertebral hemangioma at T11.   12/12/2019 Breast MRI   IMPRESSION: 1. The biopsy proven malignancy in the right breast involves all 4 quadrants and spans up to 9 cm by MRI. There is  enhancement within the skin at the level of the nipple.   2. Multiple matted masses are seen in the right axilla, either abnormal metastatic lymph nodes or a combination of masses and metastatic lymph nodes. One of these has been previously biopsied showing invasive mammary carcinoma without definitive nodal tissue.   3.  There is a 1.5 cm Rotter's lymph node on the right.   4. There is markedly abnormal enhancement and edema in the right pectoralis major muscle spanning 6-7 cm, suspicious for metastatic disease.   5. There is diffuse nodular enhancement throughout the left breast, which may correspond with the solid-appearing masses seen on ultrasound. These all have a similar appearance on MRI.   6.  No findings of left axillary lymphadenopathy.   12/15/2019 Procedure   PAC placed    12/24/2019 Imaging   MRI abdomen  IMPRESSION: 1. Multifocal enhancing bone lesions are noted within the lumbar spine, and bony pelvis compatible with metastatic disease. 2. Four, small peripherally enhancing cystic lesions are noted within the liver worrisome for metastatic disease. 3. 2 enhancing liver lesions are also noted with signal and enhancement characteristics consistent with benign liver hemangioma. 4. Well-circumscribed, enhancing T2 and T1 hypointense structure within the right adnexal region is indeterminate. It is unclear whether not this is arising from the right ovary, broad ligament or right side of uterine fundus. Differential considerations include fibrothecoma, versus pedunculated uterine leiomyoma. Metastatic disease is considered less favored. Attention on follow-up imaging is recommended.     01/03/2020 - 05/27/2020 Chemotherapy   First line THP q3weeks starting 01/03/20-05/27/20   01/05/2020 Imaging   US Abdomen  IMPRESSION: 1. Suspect hematoma in the medial right lobe of the liver in the area of previous biopsy. Note that recent MR does not show lesion in this area. This  area has ill-defined margins and measures 3.9 x 4.0 x 3.5 cm. No perihepatic fluid.   2. Probable hemangiomas elsewhere in the right lobe of the liver. Note that several smaller lesions seen on recent MR are not appreciable by ultrasound.   3.  Study otherwise unremarkable.   01/12/2020 Pathology Results   FINAL MICROSCOPIC DIAGNOSIS:   A. BONE, SCLEROTIC LESION, LEFT ANTERIOR ILIAC SPINE, BIOPSY:  - Metastatic carcinoma, consistent with patient's clinical history of  primary breast carcinoma  - See comment   COMMENT:  Immunohistochemical stains show that the tumor cells are positive for  CK7 and GATA3; and negative for CK20, consistent with above  interpretation.   PROGNOSTIC INDICATOR RESULTS:  The tumor cells are NEGATIVE for Her2 (0); see comment  Estrogen Receptor: NEGATIVE; see comment  Progesterone Receptor: NEGATIVE; see comment    03/23/2020 Imaging   CT CAP IMPRESSION: 1. Right lower lobe perifissural nodule is slightly more conspicuous on today's study. Close attention on follow-up recommended. 2. Interval progression of segment IV liver lesion, concerning for metastatic progression. The remaining visualized liver lesions are stable to smaller in the interval. 3. Interval evolution in appearance of thoracolumbar spinal lesions and left iliac crest lesion, potentially reflecting response to therapy/healing. No definite new osseous lesions on today's study.  4. New small fluid collection right cul-de-sac. 5. Probable fibroid change in the uterus including exophytic right fundal lesion. 6. Aortic Atherosclerosis (ICD10-I70.0).   03/31/2020 Imaging   MR ABD IMPRESSION: 1. Enlarging hepatic lesion in hepatic subsegment IV suspicious for hepatic metastasis. The it is uncertain whether the change in the short interval is due to technical factors with different modalities or true enlargement. 2. Lesion in the posterior RIGHT hepatic lobe with imaging features that are  atypical for hemangioma but stability and signs of enhancement on later phase raising this question. Another more classic appearing may angioma seen inferior to this location in hepatic subsegment VI. Attention on follow-up. 3. Heterogeneous enhancement throughout the spine particularly at L3 as exhibited on the prior study with very patchy marrow signal remains suspicious for bony metastatic lesions in this patient with known bone metastases.   04/01/2020 Imaging   Bone scan IMPRESSION: Good response to therapy with resolution of sites of uptake seen previously at the cervical spine and posterior RIGHT tenth rib as well as a diminished degree of uptake at remaining previously identified osseous metastatic foci.   04/28/2020 PET scan   IMPRESSION: 1. No hypermetabolic lesions to suggest active metastatic disease involving the liver or osseous structures. 2. Stable small low-attenuation liver lesions, likely treated disease.   06/18/2020 -  Chemotherapy   Given her excellent response, I switched to maintenance therapy with Herceptin, Perjeta (Phesgo) q3weeks and Letrozole.   06/18/2020 -  Chemotherapy   Zometa starting 06/18/20   06/18/2020 -  Anti-estrogen oral therapy   Letrozole 2.5mg  once dialy.    06/18/2020 - 06/16/2022 Chemotherapy   Patient is on Treatment Plan : BREAST Trastuzumab + Pertuzumab q21d     09/03/2020 Imaging   Bone Scan IMPRESSION: 1. Significant interval improvement compared to previous studies. Only mild uptake remains in the pelvis as above. There has been significant interval improvement over time.  CT C/A/P IMPRESSION: 1. Hypodense liver lesions are slightly decreased in size compared to prior examination, consistent with treatment response of hepatic metastatic disease. 2. Unchanged sclerotic lesion of the anterior left iliac crest. Unchanged lytic superior endplate deformity of L3, which remains equivocal for metastatic lesion versus mechanical  Schmorl deformity. No CT evidence of new osseous metastatic disease. 3. No evidence of new metastatic disease in the chest, abdomen, or pelvis. 4. Coronary artery disease.   05/05/2022 Imaging    IMPRESSION: 1. Interval decreased size of the largest hepatic metastatic lesion with stable size of the additional subcentimeter bilobar hepatic lesions. Continued decrease in size of the bilobar hepatic metastases. 2. Similar appearance of the osseous metatatic disease. No new aggressive lytic or blastic lesion of bone. 3. No evidence of new or progressive metastatic disease in the chest, abdomen or pelvis.     05/05/2022 Imaging    IMPRESSION: Faint residual radiotracer uptake in the left hemipelvis.   New focus of mild abnormal radiotracer uptake in the right lateral seventh rib without discrete CT correlate on same day examination, nonspecific suggest correlation with history of interval trauma.     07/07/2022 -  Chemotherapy   Patient is on Treatment Plan : BREAST Trastuzumab + Pertuzumab q21d        INTERVAL HISTORY:  Carolyn Stewart is here for a follow up of  metastatic breast cancer  She was last seen by me on 05/05/2022 She presents to the clinic alone. Pt state she had no problems with the last injection. Pt state she still has neuropathy  in her hands and feet and she takes Gabapentin and it seems to help.   All other systems were reviewed with the patient and are negative.  MEDICAL HISTORY:  Past Medical History:  Diagnosis Date   Cancer ([place]) 2021   Hypertension     SURGICAL HISTORY: Past Surgical History:  Procedure Laterality Date   left parotid tumor removal   2006   PORTACATH PLACEMENT Left 12/15/2019   Procedure: INSERTION PORT-A-CATH WITH ULTRASOUND GUIDANCE;  Surgeon: [name], MD;  Location: [facility];  Service: General;  Laterality: Left;   TONSILLECTOMY      I have reviewed the social history and family history with the patient and they are  unchanged from previous note.  ALLERGIES:  has No Known Allergies.  MEDICATIONS:  Current Outpatient Medications  Medication Sig Dispense Refill   acetaminophen (TYLENOL) 500 MG tablet Take 1,000 mg by mouth every 6 (six) hours as needed for moderate pain or headache.     amLODipine (NORVASC) 10 MG tablet Take 2 tablets (20 mg total) by mouth daily. 60 tablet 11   Cholecalciferol (VITAMIN D3) 50 MCG (2000 UT) TABS Take 2,000 Units by mouth daily.      gabapentin (NEURONTIN) 100 MG capsule TAKE 2 CAPSULE BY MOUTH AT BEDTIME 60 capsule 2   letrozole (FEMARA) 2.5 MG tablet Take 1 tablet (2.5 mg total) by mouth daily. 90 tablet 1   lidocaine-prilocaine (EMLA) cream Apply 1 application topically as needed. 30 g 1   losartan (COZAAR) 100 MG tablet TAKE ONE TABLET BY MOUTH DAILY 90 tablet 3   metoprolol succinate (TOPROL-XL) 100 MG 24 hr tablet Take 1 tablet (100 mg total) by mouth daily. 30 tablet 6   omeprazole (PRILOSEC OTC) 20 MG tablet Take 20 mg by mouth daily.     potassium chloride (KLOR-CON M) 10 MEQ tablet Take 1 tablet (10 mEq total) by mouth daily. 90 tablet 1   No current facility-administered medications for this visit.   Facility-Administered Medications Ordered in Other Visits  Medication Dose Route Frequency Provider Last Rate Last Admin   pertuz-trastuz-hyaluron-zzxf ([place]) [id number] MG-MG-U/10ML chemo SQ injection maintenance dose 10 mL  10 mL Subcutaneous Once Truitt Merle, MD       prochlorperazine (COMPAZINE) tablet 10 mg  10 mg Oral Once Truitt Merle, MD        PHYSICAL EXAMINATION: ECOG PERFORMANCE STATUS: 0 - Asymptomatic  Vitals:   07/07/22 1003  BP: 118/67  Pulse: 82  Resp: 16  Temp: 98.7 F (37.1 C)  SpO2: 96%   Wt Readings from Last 3 Encounters:  07/07/22 201 lb (91.2 kg)  06/16/22 202 lb (91.6 kg)  05/26/22 202 lb 3.2 oz (91.7 kg)     GENERAL:alert, no distress and comfortable SKIN: skin color normal, no rashes or significant lesions EYES: normal,  Conjunctiva are pink and non-injected, sclera clear  NEURO: alert & oriented x 3 with fluent speech    LABORATORY DATA:  I have reviewed the data as listed    Latest Ref Rng & Units 07/07/2022    9:39 AM 05/05/2022    9:07 AM 03/24/2022    9:25 AM  CBC  WBC 4.0 - 10.5 K/uL 6.6  5.5  8.6   Hemoglobin 12.0 - 15.0 g/dL 12.8  13.2  12.7   Hematocrit 36.0 - 46.0 % 38.1  40.0  37.8   Platelets 150 - 400 K/uL 293  297  303         Latest Ref  Rng & Units 07/07/2022    9:39 AM 05/05/2022    9:07 AM 03/24/2022    9:25 AM  CMP  Glucose 70 - 99 mg/dL 130  129  123   BUN 8 - 23 mg/dL 15  9  12    Creatinine 0.44 - 1.00 mg/dL 0.76  0.71  0.76   Sodium 135 - 145 mmol/L 142  141  140   Potassium 3.5 - 5.1 mmol/L 3.5  3.3  3.3   Chloride 98 - 111 mmol/L 111  109  109   CO2 22 - 32 mmol/L 27  27  26    Calcium 8.9 - 10.3 mg/dL 8.9  9.0  9.2   Total Protein 6.5 - 8.1 g/dL 7.1  6.9  7.2   Total Bilirubin 0.3 - 1.2 mg/dL 0.4  0.4  0.4   Alkaline Phos 38 - 126 U/L 102  114  106   AST 15 - 41 U/L 14  14  14    ALT 0 - 44 U/L 8  8  12        RADIOGRAPHIC STUDIES: I have personally reviewed the radiological images as listed and agreed with the findings in the report. No results found.    No orders of the defined types were placed in this encounter.  All questions were answered. The patient knows to call the clinic with any problems, questions or concerns. No barriers to learning was detected. The total time spent in the appointment was 30 minutes.     Truitt Merle, MD 07/07/2022   Felicity Coyer, CMA, am acting as scribe for Truitt Merle, MD.   I have reviewed the above documentation for accuracy and completeness, and I agree with the above.

## 2022-07-10 ENCOUNTER — Other Ambulatory Visit (HOSPITAL_COMMUNITY): Payer: Self-pay | Admitting: Cardiology

## 2022-07-10 MED ORDER — AMLODIPINE BESYLATE 10 MG PO TABS: 20.0000 mg | ORAL_TABLET | Freq: Every day | ORAL | 11 refills | Status: DC

## 2022-07-11 ENCOUNTER — Telehealth: Payer: Self-pay | Admitting: Hematology

## 2022-07-11 NOTE — Telephone Encounter (Signed)
Contacted patient to scheduled appointments. Left message with appointment details and a call back number if patient had any questions or could not accommodate the time we provided.   

## 2022-07-25 ENCOUNTER — Other Ambulatory Visit: Payer: Self-pay

## 2022-07-28 ENCOUNTER — Inpatient Hospital Stay: Payer: Commercial Managed Care - PPO | Attending: Hematology

## 2022-07-28 VITALS — BP 128/79 | HR 79 | Temp 98.2°F | Resp 16

## 2022-07-28 DIAGNOSIS — C50411 Malignant neoplasm of upper-outer quadrant of right female breast: Secondary | ICD-10-CM | POA: Diagnosis not present

## 2022-07-28 DIAGNOSIS — Z5111 Encounter for antineoplastic chemotherapy: Secondary | ICD-10-CM | POA: Diagnosis present

## 2022-07-28 DIAGNOSIS — C7951 Secondary malignant neoplasm of bone: Secondary | ICD-10-CM | POA: Diagnosis not present

## 2022-07-28 DIAGNOSIS — Z170 Estrogen receptor positive status [ER+]: Secondary | ICD-10-CM | POA: Diagnosis not present

## 2022-07-28 MED ORDER — PERTUZ-TRASTUZ-HYALURON-ZZXF 60-60-2000 MG-MG-U/ML CHEMO ~~LOC~~ SOLN
10.0000 mL | Freq: Once | SUBCUTANEOUS | Status: AC
  Administered 2022-07-28: 10 mL via SUBCUTANEOUS
  Filled 2022-07-28: qty 10

## 2022-07-28 MED ORDER — DIPHENHYDRAMINE HCL 25 MG PO CAPS
50.0000 mg | ORAL_CAPSULE | Freq: Once | ORAL | Status: AC
  Administered 2022-07-28: 50 mg via ORAL
  Filled 2022-07-28: qty 2

## 2022-07-28 MED ORDER — ACETAMINOPHEN 325 MG PO TABS
650.0000 mg | ORAL_TABLET | Freq: Once | ORAL | Status: AC
  Administered 2022-07-28: 650 mg via ORAL
  Filled 2022-07-28: qty 2

## 2022-07-28 NOTE — Patient Instructions (Signed)
[facility]  Discharge Instructions: Thank you for choosing [facility] to provide your oncology and hematology care.   If you have a lab appointment with the Cancer Center, please go directly to the Cancer Center and check in at the registration area.   Wear comfortable clothing and clothing appropriate for easy access to any Portacath or PICC line.   We strive to give you quality time with your provider. You may need to reschedule your appointment if you arrive late (15 or more minutes).  Arriving late affects you and other patients whose appointments are after yours.  Also, if you miss three or more appointments without notifying the office, you may be dismissed from the clinic at the provider's discretion.      For prescription refill requests, have your pharmacy contact our office and allow 72 hours for refills to be completed.    Today you received the following chemotherapy and/or immunotherapy agents: phesgo      To help prevent nausea and vomiting after your treatment, we encourage you to take your nausea medication as directed.  BELOW ARE SYMPTOMS THAT SHOULD BE REPORTED IMMEDIATELY: *FEVER GREATER THAN 100.4 F (38 C) OR HIGHER *CHILLS OR SWEATING *NAUSEA AND VOMITING THAT IS NOT CONTROLLED WITH YOUR NAUSEA MEDICATION *UNUSUAL SHORTNESS OF BREATH *UNUSUAL BRUISING OR BLEEDING *URINARY PROBLEMS (pain or burning when urinating, or frequent urination) *BOWEL PROBLEMS (unusual diarrhea, constipation, pain near the anus) TENDERNESS IN MOUTH AND THROAT WITH OR WITHOUT PRESENCE OF ULCERS (sore throat, sores in mouth, or a toothache) UNUSUAL RASH, SWELLING OR PAIN  UNUSUAL VAGINAL DISCHARGE OR ITCHING   Items with * indicate a potential emergency and should be followed up as soon as possible or go to the Emergency Department if any problems should occur.  Please show the CHEMOTHERAPY ALERT CARD or IMMUNOTHERAPY ALERT CARD at check-in  to the Emergency Department and triage nurse.  Should you have questions after your visit or need to cancel or reschedule your appointment, please contact [facility]  Dept: [phone]  and follow the prompts.  Office hours are 8:00 a.m. to 4:30 p.m. Monday - Friday. Please note that voicemails left after 4:00 p.m. may not be returned until the following business day.  We are closed weekends and major holidays. You have access to a nurse at all times for urgent questions. Please call the main number to the clinic Dept: [phone] and follow the prompts.   For any non-urgent questions, you may also contact your provider using MyChart. We now offer e-Visits for anyone 10 and older to request care online for non-urgent symptoms. For details visit [email or web address].   Also download the MyChart app! Go to the app store, search "MyChart", open the app, select [place], and log in with your MyChart username and password.

## 2022-07-28 NOTE — Progress Notes (Signed)
Patient will be due for Zometa next visit.

## 2022-07-30 ENCOUNTER — Other Ambulatory Visit: Payer: Self-pay

## 2022-08-11 ENCOUNTER — Other Ambulatory Visit: Payer: Self-pay

## 2022-08-18 ENCOUNTER — Inpatient Hospital Stay: Payer: Commercial Managed Care - PPO

## 2022-08-18 VITALS — BP 110/60 | HR 78 | Temp 98.2°F | Resp 18 | Ht 67.0 in | Wt 201.4 lb

## 2022-08-18 DIAGNOSIS — Z170 Estrogen receptor positive status [ER+]: Secondary | ICD-10-CM

## 2022-08-18 DIAGNOSIS — Z5111 Encounter for antineoplastic chemotherapy: Secondary | ICD-10-CM | POA: Diagnosis not present

## 2022-08-18 DIAGNOSIS — C7951 Secondary malignant neoplasm of bone: Secondary | ICD-10-CM

## 2022-08-18 DIAGNOSIS — T451X5A Adverse effect of antineoplastic and immunosuppressive drugs, initial encounter: Secondary | ICD-10-CM

## 2022-08-18 LAB — CMP (CANCER CENTER ONLY)
ALT: 8 U/L (ref 0–44)
AST: 13 U/L — ABNORMAL LOW (ref 15–41)
Albumin: 4.2 g/dL (ref 3.5–5.0)
Alkaline Phosphatase: 90 U/L (ref 38–126)
Anion gap: 9 (ref 5–15)
BUN: 13 mg/dL (ref 8–23)
CO2: 26 mmol/L (ref 22–32)
Calcium: 8.9 mg/dL (ref 8.9–10.3)
Chloride: 108 mmol/L (ref 98–111)
Creatinine: 0.73 mg/dL (ref 0.44–1.00)
GFR, Estimated: >60 mL/min (ref >60)
Glucose, Bld: 135 mg/dL — ABNORMAL HIGH (ref 70–99)
Potassium: 3.3 mmol/L — ABNORMAL LOW (ref 3.5–5.1)
Sodium: 143 mmol/L (ref 135–145)
Total Bilirubin: 0.5 mg/dL (ref 0.3–1.2)
Total Protein: 6.8 g/dL (ref 6.5–8.1)

## 2022-08-18 MED ORDER — ZOLEDRONIC ACID 4 MG/100ML IV SOLN
4.0000 mg | Freq: Once | INTRAVENOUS | Status: AC
  Administered 2022-08-18: 4 mg via INTRAVENOUS
  Filled 2022-08-18: qty 100

## 2022-08-18 MED ORDER — SODIUM CHLORIDE 0.9 % IV SOLN: Freq: Once | INTRAVENOUS | Status: AC

## 2022-08-18 MED ORDER — PERTUZ-TRASTUZ-HYALURON-ZZXF 60-60-2000 MG-MG-U/ML CHEMO ~~LOC~~ SOLN
10.0000 mL | Freq: Once | SUBCUTANEOUS | Status: AC
  Administered 2022-08-18: 10 mL via SUBCUTANEOUS
  Filled 2022-08-18: qty 10

## 2022-08-18 MED ORDER — SODIUM CHLORIDE 0.9% FLUSH
10.0000 mL | INTRAVENOUS | Status: DC | PRN
  Administered 2022-08-18: 10 mL

## 2022-08-18 MED ORDER — ACETAMINOPHEN 325 MG PO TABS
650.0000 mg | ORAL_TABLET | Freq: Once | ORAL | Status: AC
  Administered 2022-08-18: 650 mg via ORAL
  Filled 2022-08-18: qty 2

## 2022-08-18 MED ORDER — HEPARIN SOD (PORK) LOCK FLUSH 100 UNIT/ML IV SOLN
500.0000 [IU] | Freq: Once | INTRAVENOUS | Status: AC | PRN
  Administered 2022-08-18: 500 [IU]

## 2022-08-18 MED ORDER — DIPHENHYDRAMINE HCL 25 MG PO CAPS
50.0000 mg | ORAL_CAPSULE | Freq: Once | ORAL | Status: AC
  Administered 2022-08-18: 50 mg via ORAL
  Filled 2022-08-18: qty 2

## 2022-08-18 NOTE — Patient Instructions (Signed)
[facility]   Discharge Instructions: Thank you for choosing [facility] to provide your oncology and hematology care.   If you have a lab appointment with the [facility], please go directly to the [facility] and check in at the registration area.   Wear comfortable clothing and clothing appropriate for easy access to any Portacath or PICC line.   We strive to give you quality time with your provider. You may need to reschedule your appointment if you arrive late (15 or more minutes).  Arriving late affects you and other patients whose appointments are after yours.  Also, if you miss three or more appointments without notifying the office, you may be dismissed from the clinic at the provider's discretion.      For prescription refill requests, have your pharmacy contact our office and allow 72 hours for refills to be completed.    Today you received the following chemotherapy and/or immunotherapy agents Phesgo.      To help prevent nausea and vomiting after your treatment, we encourage you to take your nausea medication as directed.  BELOW ARE SYMPTOMS THAT SHOULD BE REPORTED IMMEDIATELY: *FEVER GREATER THAN 100.4 F (38 C) OR HIGHER *CHILLS OR SWEATING *NAUSEA AND VOMITING THAT IS NOT CONTROLLED WITH YOUR NAUSEA MEDICATION *UNUSUAL SHORTNESS OF BREATH *UNUSUAL BRUISING OR BLEEDING *URINARY PROBLEMS (pain or burning when urinating, or frequent urination) *BOWEL PROBLEMS (unusual diarrhea, constipation, pain near the anus) TENDERNESS IN MOUTH AND THROAT WITH OR WITHOUT PRESENCE OF ULCERS (sore throat, sores in mouth, or a toothache) UNUSUAL RASH, SWELLING OR PAIN  UNUSUAL VAGINAL DISCHARGE OR ITCHING   Items with * indicate a potential emergency and should be followed up as soon as possible or go to the Emergency Department if any problems should occur.  Please show the CHEMOTHERAPY ALERT CARD or IMMUNOTHERAPY ALERT CARD at check-in  to the Emergency Department and triage nurse.  Should you have questions after your visit or need to cancel or reschedule your appointment, please contact [facility]  Dept: [phone]  and follow the prompts.  Office hours are 8:00 a.m. to 4:30 p.m. Monday - Friday. Please note that voicemails left after 4:00 p.m. may not be returned until the following business day.  We are closed weekends and major holidays. You have access to a nurse at all times for urgent questions. Please call the main number to the clinic Dept: [phone] and follow the prompts.   For any non-urgent questions, you may also contact your provider using MyChart. We now offer e-Visits for anyone 20 and older to request care online for non-urgent symptoms. For details visit [email or web address].   Also download the MyChart app! Go to the app store, search "MyChart", open the app, select [facility], and log in with your MyChart username and password.  Pertuzumab; Trastuzumab; Hyaluronidase Injection What is this medication? PERTUZUMAB; TRASTUZUMAB; HYALURONIDASE (per TOOZ ue mab; tras TOO zoo mab; hye al ur ON i dase) treats breast cancer. Pertuzumab and trastuzumab work by blocking a protein that causes cancer cells to grow and multiply. This helps to slow or stop the spread of cancer cells. Hyaluronidase works by increasing the absorption of other medications in the body to help them work better. It is a combination medication that contains two monoclonal antibodies. This medicine may be used for other purposes; ask your health care provider or pharmacist if you have questions. COMMON BRAND NAME(S): PHESGO What should  I tell my care team before I take this medication? They need to know if you have any of these conditions: Heart failure High blood pressure Irregular heartbeat or rhythm Lung disease An unusual or allergic reaction to pertuzumab, trastuzumab, hyaluronidase, other  medications, foods, dyes, or preservatives Pregnant or trying to get pregnant Breast-feeding How should I use this medication? This medication is injected under the skin. It is usually given by your care team in a hospital or clinic setting. It may also be given at home by your care team. Talk to your care team about the use of this medication in children. Special care may be needed. Overdosage: If you think you have taken too much of this medicine contact a poison control center or emergency room at once. NOTE: This medicine is only for you. Do not share this medicine with others. What if I miss a dose? Keep appointments for follow-up doses. It is important not to miss your dose. Call your care team if you are unable to keep an appointment. What may interact with this medication? Certain types of chemotherapy, such as daunorubicin, doxorubicin, epirubicin, idarubicin This list may not describe all possible interactions. Give your health care provider a list of all the medicines, herbs, non-prescription drugs, or dietary supplements you use. Also tell them if you smoke, drink alcohol, or use illegal drugs. Some items may interact with your medicine. What should I watch for while using this medication? Your condition will be monitored carefully while you are receiving this medication. This medication may make you feel generally unwell. This is not uncommon as chemotherapy can affect healthy cells as well as cancer cells. Report any side effects. Continue your course of treatment even though you feel ill unless your care team tells you to stop. This medication may increase your risk of getting an infection. Call your care team for advice if you get a fever, chills, sore throat, or other symptoms of a cold or flu. Do not treat yourself. Try to avoid being around people who are sick. Avoid taking medications that contain aspirin, acetaminophen, ibuprofen, naproxen, or ketoprofen unless instructed by  your care team. These medications may hide a fever. Talk to your care team if you may be pregnant. Serious birth defects can occur if you take this medication during pregnancy and for 7 months after the last dose. You will need a negative pregnancy test before starting this medication. Contraception is recommended while taking this medication and for 7 months after the last dose. Your care team can help you find the option that works for you. Do not breastfeed while taking this medication and for 7 months after the last dose. What side effects may I notice from receiving this medication? Side effects that you should report to your care team as soon as possible: Allergic reactions or angioedema--skin rash, itching or hives, swelling of the face, eyes, lips, tongue, arms, or legs, trouble swallowing or breathing Dry cough, shortness of breath or trouble breathing Heart failure--shortness of breath, swelling of the ankles, feet, or hands, sudden weight gain, unusual weakness or fatigue Infection--fever, chills, cough, or sore throat Infusion reactions--chest pain, shortness of breath or trouble breathing, feeling faint or lightheaded Side effects that usually do not require medical attention (report these to your care team if they continue or are bothersome): Diarrhea Hair loss Nausea Pain, redness, or irritation at injection site Pain, redness, or swelling with sores inside the mouth or throat Unusual weakness or fatigue This list may  not describe all possible side effects. Call your doctor for medical advice about side effects. You may report side effects to FDA at 1-800-FDA-1088. Where should I keep my medication? This medication is given in a hospital or clinic. It will not be stored at home. NOTE: This sheet is a summary. It may not cover all possible information. If you have questions about this medicine, talk to your doctor, pharmacist, or health care provider.  2023 Elsevier/Gold Standard  (2021-08-11 00:00:00) Zoledronic Acid Injection (Cancer) What is this medication? ZOLEDRONIC ACID (ZOE le dron ik AS id) treats high calcium levels in the blood caused by cancer. It may also be used with chemotherapy to treat weakened bones caused by cancer. It works by slowing down the release of calcium from bones. This lowers calcium levels in your blood. It also makes your bones stronger and less likely to break (fracture). It belongs to a group of medications called bisphosphonates. This medicine may be used for other purposes; ask your health care provider or pharmacist if you have questions. COMMON BRAND NAME(S): Zometa, Zometa Powder What should I tell my care team before I take this medication? They need to know if you have any of these conditions: Dehydration Dental disease Kidney disease Liver disease Low levels of calcium in the blood Lung or breathing disease, such as asthma Receiving steroids, such as dexamethasone or prednisone An unusual or allergic reaction to zoledronic acid, other medications, foods, dyes, or preservatives Pregnant or trying to get pregnant Breast-feeding How should I use this medication? This medication is injected into a vein. It is given by your care team in a hospital or clinic setting. Talk to your care team about the use of this medication in children. Special care may be needed. Overdosage: If you think you have taken too much of this medicine contact a poison control center or emergency room at once. NOTE: This medicine is only for you. Do not share this medicine with others. What if I miss a dose? Keep appointments for follow-up doses. It is important not to miss your dose. Call your care team if you are unable to keep an appointment. What may interact with this medication? Certain antibiotics given by injection Diuretics, such as bumetanide, furosemide NSAIDs, medications for pain and inflammation, such as ibuprofen or  naproxen Teriparatide Thalidomide This list may not describe all possible interactions. Give your health care provider a list of all the medicines, herbs, non-prescription drugs, or dietary supplements you use. Also tell them if you smoke, drink alcohol, or use illegal drugs. Some items may interact with your medicine. What should I watch for while using this medication? Visit your care team for regular checks on your progress. It may be some time before you see the benefit from this medication. Some people who take this medication have severe bone, joint, or muscle pain. This medication may also increase your risk for jaw problems or a broken thigh bone. Tell your care team right away if you have severe pain in your jaw, bones, joints, or muscles. Tell you care team if you have any pain that does not go away or that gets worse. Tell your dentist and dental surgeon that you are taking this medication. You should not have major dental surgery while on this medication. See your dentist to have a dental exam and fix any dental problems before starting this medication. Take good care of your teeth while on this medication. Make sure you see your dentist for regular follow-up  appointments. You should make sure you get enough calcium and vitamin D while you are taking this medication. Discuss the foods you eat and the vitamins you take with your care team. Check with your care team if you have severe diarrhea, nausea, and vomiting, or if you sweat a lot. The loss of too much body fluid may make it dangerous for you to take this medication. You may need bloodwork while taking this medication. Talk to your care team if you wish to become pregnant or think you might be pregnant. This medication can cause serious birth defects. What side effects may I notice from receiving this medication? Side effects that you should report to your care team as soon as possible: Allergic reactions--skin rash, itching, hives,  swelling of the face, lips, tongue, or throat Kidney injury--decrease in the amount of urine, swelling of the ankles, hands, or feet Low calcium level--muscle pain or cramps, confusion, tingling, or numbness in the hands or feet Osteonecrosis of the jaw--pain, swelling, or redness in the mouth, numbness of the jaw, poor healing after dental work, unusual discharge from the mouth, visible bones in the mouth Severe bone, joint, or muscle pain Side effects that usually do not require medical attention (report to your care team if they continue or are bothersome): Constipation Fatigue Fever Loss of appetite Nausea Stomach pain This list may not describe all possible side effects. Call your doctor for medical advice about side effects. You may report side effects to FDA at 1-800-FDA-1088. Where should I keep my medication? This medication is given in a hospital or clinic. It will not be stored at home. NOTE: This sheet is a summary. It may not cover all possible information. If you have questions about this medicine, talk to your doctor, pharmacist, or health care provider.  2023 Elsevier/Gold Standard (2007-06-01 00:00:00)

## 2022-08-30 ENCOUNTER — Other Ambulatory Visit: Payer: Self-pay

## 2022-08-31 ENCOUNTER — Ambulatory Visit (HOSPITAL_BASED_OUTPATIENT_CLINIC_OR_DEPARTMENT_OTHER)
Admission: RE | Admit: 2022-08-31 | Discharge: 2022-08-31 | Disposition: A | Discharge status: Home / Self Care | Payer: Commercial Managed Care - PPO | Source: Ambulatory Visit | Attending: Internal Medicine | Admitting: Internal Medicine

## 2022-08-31 ENCOUNTER — Encounter (HOSPITAL_COMMUNITY): Payer: Self-pay | Admitting: Internal Medicine

## 2022-08-31 ENCOUNTER — Ambulatory Visit (HOSPITAL_COMMUNITY)
Admission: RE | Admit: 2022-08-31 | Discharge: 2022-08-31 | Disposition: A | Discharge status: Home / Self Care | Payer: Commercial Managed Care - PPO | Source: Ambulatory Visit | Attending: Internal Medicine | Admitting: Internal Medicine

## 2022-08-31 VITALS — BP 120/70 | HR 88 | Wt 201.0 lb

## 2022-08-31 DIAGNOSIS — I10 Essential (primary) hypertension: Secondary | ICD-10-CM | POA: Diagnosis not present

## 2022-08-31 DIAGNOSIS — Z79811 Long term (current) use of aromatase inhibitors: Secondary | ICD-10-CM | POA: Insufficient documentation

## 2022-08-31 DIAGNOSIS — T451X5A Adverse effect of antineoplastic and immunosuppressive drugs, initial encounter: Secondary | ICD-10-CM

## 2022-08-31 DIAGNOSIS — C50919 Malignant neoplasm of unspecified site of unspecified female breast: Secondary | ICD-10-CM | POA: Diagnosis not present

## 2022-08-31 DIAGNOSIS — Z170 Estrogen receptor positive status [ER+]: Secondary | ICD-10-CM

## 2022-08-31 DIAGNOSIS — C7951 Secondary malignant neoplasm of bone: Secondary | ICD-10-CM | POA: Insufficient documentation

## 2022-08-31 DIAGNOSIS — I509 Heart failure, unspecified: Secondary | ICD-10-CM

## 2022-08-31 DIAGNOSIS — C50411 Malignant neoplasm of upper-outer quadrant of right female breast: Secondary | ICD-10-CM | POA: Insufficient documentation

## 2022-08-31 DIAGNOSIS — I110 Hypertensive heart disease with heart failure: Secondary | ICD-10-CM | POA: Insufficient documentation

## 2022-08-31 DIAGNOSIS — I427 Cardiomyopathy due to drug and external agent: Secondary | ICD-10-CM | POA: Insufficient documentation

## 2022-08-31 LAB — ECHOCARDIOGRAM COMPLETE
Area-P 1/2: 4.24 cm2
Calc EF: 54.9 %
S' Lateral: 3.1 cm
Single Plane A2C EF: 55.2 %
Single Plane A4C EF: 55.1 %

## 2022-08-31 MED ORDER — METOPROLOL SUCCINATE ER 100 MG PO TB24: 100.0000 mg | ORAL_TABLET | Freq: Every day | ORAL | 11 refills | Status: DC

## 2022-08-31 MED ORDER — LOSARTAN POTASSIUM 100 MG PO TABS: 100.0000 mg | ORAL_TABLET | Freq: Every day | ORAL | 3 refills | Status: DC

## 2022-08-31 MED ORDER — AMLODIPINE BESYLATE 10 MG PO TABS: 20.0000 mg | ORAL_TABLET | Freq: Every day | ORAL | 11 refills | Status: DC

## 2022-08-31 NOTE — Progress Notes (Signed)
Echocardiogram 2D Echocardiogram has been performed.  Carolyn Stewart 08/31/2022, 9:30 AM

## 2022-08-31 NOTE — Addendum Note (Signed)
Encounter addended by: Suezanne Cheshire, RN on: 08/31/2022 10:38 AM  Actions taken: Order list changed

## 2022-08-31 NOTE — Progress Notes (Signed)
CARDIO-ONCOLOGY NOTE  Referring Physician: Dr. Mosetta Putt Primary Care: None HF Cardiologist: Dr. Gala Romney  HPI:  Carolyn Stewart is 62 y.o. female with HTN and stage IV right breast cancer referred by Dr. Mosetta Putt for enrollment into the Cardio-Oncology program due to reduced EF on echo.   Diagnosed with R breast cancer in 8/21 (triple positive) found to have mets to bone. She received THP 01/03/20-05/27/20. Her restaging PET scan from 04/28/20 showed no hypermetabolic lesions. Given her excellent response, she was switched to maintenance therapy with Herceptin, Perjeta q3weeks and Letrozole starting 06/18/20 which is still ongoing.  Zometa started 06/18/20  In June had echo which showed EF 50-55% so H/P held. Losartan started.   Had covid in July. Received Paxlovid and recovered without need for hospital admission.  Echo 07/22 with EF 55-60%. Herceptin restarted.  Echo 9/22 EF 55% felt to be slightly down from before but still in normal range  Echo 03/09/21 EF 55%  Here for f/u today, continues on maintenance Herceptin every 3 weeks, tolerating well. 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 Doing great. No CP or SOB   Echo today 08/31/22 EF 55-60% GLS -18.0%   ECHO 10/13/21 EF 55-60% GLS -22.4% ECHO 06/16/21; EF 55-60% GLS -20% ECHO 07/22: EF 55-60% GLS 16 ECHO  09/22/20: EF 40-45%  (I felt 50-55%) ECHO 12/21: EF 60-65% GLS -18.2 ECHO 8/21: EF 60-65% G1DD   Review of Systems: Negative except as mentioned in HPI  Past Medical History:  Diagnosis Date   Cancer (HCC) 2021   Hypertension     Current Outpatient Medications  Medication Sig Dispense Refill   acetaminophen (TYLENOL) 500 MG tablet Take 1,000 mg by mouth every 6 (six) hours as needed for moderate pain or headache.     amLODipine (NORVASC) 10 MG tablet Take 2 tablets (20 mg total) by mouth daily. 60 tablet 11   Cholecalciferol (VITAMIN D3) 50 MCG (2000 UT) TABS Take 2,000 Units by mouth daily.      gabapentin (NEURONTIN)  100 MG capsule TAKE 2 CAPSULE BY MOUTH AT BEDTIME 60 capsule 2   letrozole (FEMARA) 2.5 MG tablet Take 1 tablet (2.5 mg total) by mouth daily. 90 tablet 1   lidocaine-prilocaine (EMLA) cream Apply 1 application topically as needed. 30 g 1   losartan (COZAAR) 100 MG tablet TAKE ONE TABLET BY MOUTH DAILY 90 tablet 3   metoprolol succinate (TOPROL-XL) 100 MG 24 hr tablet Take 1 tablet (100 mg total) by mouth daily. 30 tablet 6   omeprazole (PRILOSEC OTC) 20 MG tablet Take 20 mg by mouth daily.     potassium chloride (KLOR-CON M) 10 MEQ tablet Take 1 tablet (10 mEq total) by mouth daily. 90 tablet 1   No current facility-administered medications for this encounter.    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   Tobacco Use   Smoking status: Never   Smokeless tobacco: Never  Vaping Use   Vaping Use: Never used  Substance and Sexual Activity   Alcohol use: Yes    Comment: social drinker    Drug use: No   Sexual activity: Yes  Other Topics Concern   Not on file  Social History Narrative   Not on file   Social Determinants of Health   Financial Resource Strain: Low Risk  (12/03/2019)   Overall Financial Resource Strain (CARDIA)    Difficulty of Paying Living Expenses: Not hard at all  Food Insecurity: No Food Insecurity (12/03/2019)   Hunger Vital Sign    Worried About Running Out of Food in the Last Year: Never true    Ran Out of Food in the Last Year: Never true  Transportation Needs: No Transportation Needs (12/03/2019)   PRAPARE - Administrator, Civil Service (Medical): No    Lack of Transportation (Non-Medical): No  Physical Activity: Not on file  Stress: Not on file  Social Connections: Not on file  Intimate Partner Violence: Not on  file      Family History  Problem Relation Age of Onset   Hypertension Mother    Hypertension Father     Vitals:   08/31/22 0945  BP: 120/70  Pulse: 88  SpO2: 96%  Weight: 91.2 kg (201 lb)    Physical exam:  General:  Well appearing. No resp difficulty HEENT: normal Neck: supple. no JVD. Carotids 2+ bilat; no bruits. No lymphadenopathy or thryomegaly appreciated. Cor: PMI nondisplaced. Regular rate & rhythm. No rubs, gallops or murmurs. Lungs: clear Abdomen: soft, nontender, nondistended. No hepatosplenomegaly. No bruits or masses. Good bowel sounds. Extremities: no cyanosis, clubbing, rash, edema Neuro: alert & orientedx3, cranial nerves grossly intact. moves all 4 extremities w/o difficulty. Affect pleasant   EKG:  NSR, left axis dev. Mod voltage criteria for LVH, anterolateral infarct, age undetermined. 78bpm  ASSESSMENT & PLAN: 1.  Chemotherapy-induced CM - ECHO 8/21: EF 60-65% G1DD - ECHO 12/21: EF 60-65% GLS -18.2 - ECHO  09/22/20: EF 40-45%  (I felt 50-55%) - Herceptin held in June 2022 d/t mild cardiomyopathy. Restarted in August 2022 - ECHO 07/22: EF 55-60% GLS 16 - ECHO 9/22 F 55%. - Echo 03/09/21 EF 55% (completely stable) - Echo 06/16/21; EF 55-60% GLS -20% - Echo 10/13/21 EF 55-60% GLS -22.4% - Echo today 08/31/22 EF 55-60% GLS -18.0%  - Continue losartan 100 mg daily and Toprol 100 daily - Continue Herceptin. Stable, continue echos every 6 months  2. Stage IV Right Breast Cancer (triple positive) - Diagnosed with R breast cancer in 8/21 (triple positive) found to have mets to bone. She received THP 01/03/20-05/27/20. Her restaging PET scan from 04/28/20 showed no hypermetabolic lesions. Given her excellent response, she was switched to maintenance therapy with Herceptin, Perjeta q3weeks and Letrozole starting 06/18/20.  Zometa started 06/18/20, will continue every 3 months. - Herceptin held 09/22/20 due to reduced EF, restarted in August as above.  - Now on  maintenance Herceptin, Perjeta (Phesgo) q3weeks and Letrozole  - Tolerating Herceptin well. EF stable. Continue Herceptin   3. HTN -Blood pressure well controlled. Continue current regimen.   Arvilla Meres, MD  10:21 AM

## 2022-08-31 NOTE — Addendum Note (Signed)
Encounter addended by: Suezanne Cheshire, RN on: 08/31/2022 10:33 AM  Actions taken: Order list changed, Diagnosis association updated, Clinical Note Signed

## 2022-08-31 NOTE — Patient Instructions (Addendum)
Good to see you today!   No medication changes   Your physician recommends that you schedule a follow-up appointment in: 6 months(November) Call office in September to schedule an appointment  If you have any questions or concerns before your next appointment please send [place] a message through [facility] or call our office at [phone].    TO LEAVE A MESSAGE FOR THE NURSE SELECT OPTION 2, PLEASE LEAVE A MESSAGE INCLUDING: YOUR NAME DATE OF BIRTH CALL BACK NUMBER REASON FOR CALL**this is important as we prioritize the call backs  YOU WILL RECEIVE A CALL BACK THE SAME DAY AS LONG AS YOU CALL BEFORE 4:00 PM  At the [facility], you and your health needs are our priority. As part of our continuing mission to provide you with exceptional heart care, we have created designated Provider Care Teams. These Care Teams include your primary Cardiologist (physician) and Advanced Practice Providers (APPs- Physician Assistants and Nurse Practitioners) who all work together to provide you with the care you need, when you need it.   You may see any of the following providers on your designated Care Team at your next follow up: Dr Arvilla Meres Dr Marca Ancona Dr. Marcos Eke, NP Robbie Lis, [place] [facility] [place], [place] Brynda Peon, NP Karle Plumber, PharmD   Please be sure to bring in all your medications bottles to every appointment.    Thank you for choosing [facility]

## 2022-09-08 ENCOUNTER — Inpatient Hospital Stay: Payer: Commercial Managed Care - PPO | Attending: Hematology

## 2022-09-08 ENCOUNTER — Inpatient Hospital Stay: Payer: Commercial Managed Care - PPO | Admitting: Hematology

## 2022-09-08 ENCOUNTER — Encounter: Payer: Self-pay | Admitting: Hematology

## 2022-09-08 ENCOUNTER — Inpatient Hospital Stay: Payer: Commercial Managed Care - PPO

## 2022-09-08 VITALS — BP 120/71 | HR 83 | Temp 98.0°F | Resp 18 | Ht 67.0 in | Wt 202.6 lb

## 2022-09-08 DIAGNOSIS — C50411 Malignant neoplasm of upper-outer quadrant of right female breast: Secondary | ICD-10-CM

## 2022-09-08 DIAGNOSIS — Z170 Estrogen receptor positive status [ER+]: Secondary | ICD-10-CM | POA: Diagnosis not present

## 2022-09-08 DIAGNOSIS — C7951 Secondary malignant neoplasm of bone: Secondary | ICD-10-CM | POA: Insufficient documentation

## 2022-09-08 DIAGNOSIS — Z5111 Encounter for antineoplastic chemotherapy: Secondary | ICD-10-CM | POA: Diagnosis present

## 2022-09-08 DIAGNOSIS — Z79811 Long term (current) use of aromatase inhibitors: Secondary | ICD-10-CM | POA: Insufficient documentation

## 2022-09-08 DIAGNOSIS — I427 Cardiomyopathy due to drug and external agent: Secondary | ICD-10-CM

## 2022-09-08 DIAGNOSIS — T451X5A Adverse effect of antineoplastic and immunosuppressive drugs, initial encounter: Secondary | ICD-10-CM

## 2022-09-08 LAB — CBC WITH DIFFERENTIAL (CANCER CENTER ONLY)
Abs Immature Granulocytes: 0.01 10*3/uL (ref 0.00–0.07)
Basophils Absolute: 0 10*3/uL (ref 0.0–0.1)
Basophils Relative: 1 %
Eosinophils Absolute: 0.2 10*3/uL (ref 0.0–0.5)
Eosinophils Relative: 3 %
HCT: 39.9 % (ref 36.0–46.0)
Hemoglobin: 13.2 g/dL (ref 12.0–15.0)
Immature Granulocytes: 0 %
Lymphocytes Relative: 46 %
Lymphs Abs: 2.7 10*3/uL (ref 0.7–4.0)
MCH: 29.7 pg (ref 26.0–34.0)
MCHC: 33.1 g/dL (ref 30.0–36.0)
MCV: 89.7 fL (ref 80.0–100.0)
Monocytes Absolute: 0.5 10*3/uL (ref 0.1–1.0)
Monocytes Relative: 8 %
Neutro Abs: 2.4 10*3/uL (ref 1.7–7.7)
Neutrophils Relative %: 42 %
Platelet Count: 285 10*3/uL (ref 150–400)
RBC: 4.45 MIL/uL (ref 3.87–5.11)
RDW: 13.8 % (ref 11.5–15.5)
WBC Count: 5.8 10*3/uL (ref 4.0–10.5)
nRBC: 0 % (ref 0.0–0.2)

## 2022-09-08 LAB — CMP (CANCER CENTER ONLY)
ALT: 8 U/L (ref 0–44)
AST: 14 U/L — ABNORMAL LOW (ref 15–41)
Albumin: 4.2 g/dL (ref 3.5–5.0)
Alkaline Phosphatase: 103 U/L (ref 38–126)
Anion gap: 6 (ref 5–15)
BUN: 14 mg/dL (ref 8–23)
CO2: 27 mmol/L (ref 22–32)
Calcium: 8.8 mg/dL — ABNORMAL LOW (ref 8.9–10.3)
Chloride: 109 mmol/L (ref 98–111)
Creatinine: 0.81 mg/dL (ref 0.44–1.00)
GFR, Estimated: >60 mL/min (ref >60)
Glucose, Bld: 137 mg/dL — ABNORMAL HIGH (ref 70–99)
Potassium: 3.3 mmol/L — ABNORMAL LOW (ref 3.5–5.1)
Sodium: 142 mmol/L (ref 135–145)
Total Bilirubin: 0.5 mg/dL (ref 0.3–1.2)
Total Protein: 7 g/dL (ref 6.5–8.1)

## 2022-09-08 MED ORDER — PERTUZ-TRASTUZ-HYALURON-ZZXF 60-60-2000 MG-MG-U/ML CHEMO ~~LOC~~ SOLN
10.0000 mL | Freq: Once | SUBCUTANEOUS | Status: AC
  Administered 2022-09-08: 10 mL via SUBCUTANEOUS
  Filled 2022-09-08: qty 10

## 2022-09-08 MED ORDER — ACETAMINOPHEN 325 MG PO TABS
650.0000 mg | ORAL_TABLET | Freq: Once | ORAL | Status: AC
  Administered 2022-09-08: 650 mg via ORAL
  Filled 2022-09-08: qty 2

## 2022-09-08 MED ORDER — GABAPENTIN 100 MG PO CAPS: ORAL_CAPSULE | ORAL | 2 refills | Status: DC

## 2022-09-08 MED ORDER — DIPHENHYDRAMINE HCL 25 MG PO CAPS
50.0000 mg | ORAL_CAPSULE | Freq: Once | ORAL | Status: AC
  Administered 2022-09-08: 50 mg via ORAL
  Filled 2022-09-08: qty 2

## 2022-09-08 MED ORDER — SODIUM CHLORIDE 0.9% FLUSH
10.0000 mL | Freq: Once | INTRAVENOUS | Status: AC
  Administered 2022-09-08: 10 mL

## 2022-09-08 NOTE — Assessment & Plan Note (Addendum)
-  echo 09/22/20 showed decreased EF. She was started on losartan by Dr. Gala Romney on 10/21/20 and EF improved. -repeated echo 08/31/2022 showed EF 55-60%, stable

## 2022-09-08 NOTE — Assessment & Plan Note (Signed)
-  stage IV c(T3N1M1), with bone and possible liver mets,  ER+/PR+/HER2+, Grade II-III   --diagnosed in 11/2019 with large right breast mass measuring up to 11cm and right lymphadenopathy. 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 Left axillary node biopsy was benign.  -01/01/20 Liver biopsy was negative but 01/12/20 Bone Biopsy was positive for metastatic carcinoma from primary breast cancer. ER/PR/HER2 were all negative, possibly related to decalcification. -She received THP 01/03/20 - 2/3/2 -on maintenance HP Phesgo injection every 3 weeks -last staging scan in 08/2020, not repeated due to lack of insurance and she is clinically doing well  -repeated CT and bone scan 05/05/2022 showed good partial response in liver, minimum uptake in bone mets, no concerns for new lesion or evidence of progression.  I personally reviewed his scan images and discussed with patient. -Will continue Phesgo injection every 3 weeks and letrozole, she is tolerating well  

## 2022-09-08 NOTE — Progress Notes (Signed)
[facility]   [phone] [phone]   Clinic Follow up Note   Patient Care Team: Luciano Cutter, PA-C as PCP - General (Physician Assistant) Almond Lint, MD as Consulting Physician (General Surgery) Malachy Mood, MD as Consulting Physician (Hematology) Dorothy Puffer, MD as Consulting Physician (Radiation Oncology) Pershing Proud, RN as Oncology Nurse Navigator Donnelly Angelica, RN as Oncology Nurse Navigator  Date of Service:  09/08/2022  CHIEF COMPLAINT: f/u of metastatic breast cancer   CURRENT THERAPY:   -Maintenance Herceptin and Perjeta (Phesgo) q3weeks starting 06/18/20, held 09/22/20-11/24/20 due to low EF -Letrozole 2.5mg  daily starting 06/18/20    ASSESSMENT:  Carolyn Stewart is a 62 y.o. female with   Malignant neoplasm of upper-outer quadrant of right breast in female, estrogen receptor positive (HCC) stage IV [phone]), with bone and possible liver mets,  ER+/PR+/HER2+, Grade II-III   --diagnosed in 11/2019 with large right breast mass measuring up to 11cm and right lymphadenopathy. 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 Left axillary node biopsy was benign.  -01/01/20 Liver biopsy was negative but 01/12/20 Bone Biopsy was positive for metastatic carcinoma from primary breast cancer. ER/PR/HER2 were all negative, possibly related to decalcification. -She received THP 01/03/20 - 2/3/2 -on maintenance HP Phesgo injection every 3 weeks -last staging scan in 08/2020, not repeated due to lack of insurance and she is clinically doing well  -repeated CT and bone scan 05/05/2022 showed good partial response in liver, minimum uptake in bone mets, no concerns for new lesion or evidence of progression.  I personally reviewed his scan images and discussed with patient. -Will continue Phesgo injection every 3 weeks and letrozole, she is tolerating well     Metastasis to bone (HCC) -from breast  cancer  --bone scan on 12/10/19 showed metastatic disease in left acetabulum, L3, and right 10th rib.  --Zometa started 06/18/20, but held after second dose due to loss of insurance. -will restart since she has insurance now     Chemotherapy induced cardiomyopathy (HCC) -echo 09/22/20 showed decreased EF. She was started on losartan by Dr. Gala Romney on 10/21/20 and EF improved. -repeated echo 08/31/2022 showed EF 55-60%, stable     PLAN: -lab reviewed, adequate for treatment, will proceed Phesgo injection today and continue every 3 weeks -I refill Gabapentin -I order CT CAP to be done in 10/2022 -I order mammogram to be done in next few months  -lab and f/u in 2 months  SUMMARY OF ONCOLOGIC HISTORY: Oncology History Overview Note  Cancer Staging Malignant neoplasm of upper-outer quadrant of right breast in female, estrogen receptor positive (HCC) Staging form: Breast, AJCC 8th Edition - Clinical stage from 11/25/2019: Stage IIA (cT3, cN1, cM0, G2, ER+, PR+, HER2+) - Signed by Malachy Mood, MD on 12/03/2019    Malignant neoplasm of upper-outer quadrant of right breast in female, estrogen receptor positive (HCC)  11/13/2019 Mammogram    Diagnostic Mammogram 11/13/19  IMPRESSION: -Highly suspicious mass and calcifications in the right breast centered between 9 and 12 o'clock spanning up to 11 cm.  -Multiple masses in the right axilla. One of the masses contains calcifications, denoted as mass number 2 measuring 9 x 11 by 10 mm. Several of these masses do not appear to represent definitive lymph nodes. Others may be small but abnormal appearing lymph nodes. Taken in total, a cluster of masses in the right axilla spans up to 3.7 cm.  -There are fibrocystic changes on the  left. There are also some solid-appearing masses. A solid appearing mass at 10:30, 6 cm from the nipple measures 9 x 6 x 6 mm. An adjacent solid mass at 10 o'clock, 6 cm from the nipple measures 8 x 6 by 5 mm.  Another solid-appearing mass at 11 o'clock, 1 cm from the nipple measures 5 x 4 by 5 mm. Fibrocystic changes are seen at 10 o'clock, 12 o'clock, and 4 o'clock. Another solid-appearing mass seen at 5 o'clock, 4 cm from the nipple measuring 6 x 3 x 4 mm. There is a single abnormal node in the left axilla with a cortex measuring 5 mm and restriction of the fatty hilum.   11/25/2019 Initial Biopsy   Diagnosis 1. Breast, right, needle core biopsy, upper outer - INVASIVE MAMMARY CARCINOMA - MAMMARY CARCINOMA IN SITU WITH CALCIFICATIONS AND NECROSIS - SEE COMMENT 2. Lymph node, needle/core biopsy, right axilla - INVASIVE MAMMARY CARCINOMA - SEE COMMENT 3. Lymph node, needle/core biopsy, left axilla - BENIGN LYMPH NODE - NO CARCINOMA IDENTIFIED Microscopic Comment 1. The biopsy material shows an infiltrative proliferation of cells arranged linearly and in small clusters. Based on the biopsy, the carcinoma appears Nottingham grade 2 of 3 and measures 1.2 cm in greatest linear extent. E-cadherin and prognostic markers (ER/PR/ki-67/HER2)are pending and will be reported in an addendum. Dr. Berneice Heinrich reviewed the case and agrees with the above diagnosis. These results were called to [facility] on November 26, 2019. 2. Definitive nodal tissue is not identified. This biopsy has a slightly different architecture morphology than what is seen in part 1. The carcinoma measures 0.7 cm in greatest dimension. E-cadherin and prognostic markers (ER, PR, Ki-67 and HER-2) are pending and will be reported in an addendum.   11/25/2019 Receptors her2   1. PROGNOSTIC INDICATORS Results: IMMUNOHISTOCHEMICAL AND MORPHOMETRIC ANALYSIS PERFORMED MANUALLY The tumor cells are POSITIVE for Her2 (3+). Estrogen Receptor: 90%, POSITIVE, STRONG STAINING INTENSITY Progesterone Receptor: 20%, POSITIVE, STRONG STAINING INTENSITY Proliferation Marker Ki67: 30%  2. PROGNOSTIC  INDICATORS Results: IMMUNOHISTOCHEMICAL AND MORPHOMETRIC ANALYSIS PERFORMED MANUALLY The tumor cells are POSITIVE for Her2 (3+). Estrogen Receptor: 90%, POSITIVE, STRONG STAINING INTENSITY Progesterone Receptor: 25%, POSITIVE, STRONG STAINING INTENSITY Proliferation Marker Ki67: 30%   11/25/2019 Cancer Staging   Staging form: Breast, AJCC 8th Edition - Clinical stage from 11/25/2019: Stage IIA (cT3, cN1, cM0, G2, ER+, PR+, HER2+) - Signed by Malachy Mood, MD on 12/03/2019   12/01/2019 Initial Diagnosis   Malignant neoplasm of upper-outer quadrant of right breast in female, estrogen receptor positive (HCC)   12/10/2019 Imaging   Bone Scan  IMPRESSION: Findings concerning for bony metastatic disease in the left acetabulum region, L3 vertebral body region, and anterolateral right tenth rib.   Increased uptake in several large joints likely is of arthropathic etiology.   12/11/2019 Imaging   CT CAP w contrast  IMPRESSION: Prominent glandular tissue in the central right breast with nipple retraction, likely corresponding to the patient's known right breast neoplasm.   Prominent right axillary nodes measuring up to 8 mm short axis, suspicious for nodal metastases in this clinical context.   3 mm subpleural pulmonary nodule in the right lower lobe, likely benign. However, follow-up CT chest is suggested in 3-6 months.   Multiple hepatic lesions, including a dominant 2.4 cm lesion in segment 6, which is considered indeterminate. Metastasis is not excluded. Consider MRI abdomen with/without contrast for further characterization.   11 mm sclerotic lesion along the left posterior aspect of the  T7 vertebral body raises concern for isolated osseous metastasis. Correlate with priors if available. Benign vertebral hemangioma at T11.   12/12/2019 Breast MRI   IMPRESSION: 1. The biopsy proven malignancy in the right breast involves all 4 quadrants and spans up to 9 cm by MRI. There is  enhancement within the skin at the level of the nipple.   2. Multiple matted masses are seen in the right axilla, either abnormal metastatic lymph nodes or a combination of masses and metastatic lymph nodes. One of these has been previously biopsied showing invasive mammary carcinoma without definitive nodal tissue.   3.  There is a 1.5 cm Rotter's lymph node on the right.   4. There is markedly abnormal enhancement and edema in the right pectoralis major muscle spanning 6-7 cm, suspicious for metastatic disease.   5. There is diffuse nodular enhancement throughout the left breast, which may correspond with the solid-appearing masses seen on ultrasound. These all have a similar appearance on MRI.   6.  No findings of left axillary lymphadenopathy.   12/15/2019 Procedure   PAC placed    12/24/2019 Imaging   MRI abdomen  IMPRESSION: 1. Multifocal enhancing bone lesions are noted within the lumbar spine, and bony pelvis compatible with metastatic disease. 2. Four, small peripherally enhancing cystic lesions are noted within the liver worrisome for metastatic disease. 3. 2 enhancing liver lesions are also noted with signal and enhancement characteristics consistent with benign liver hemangioma. 4. Well-circumscribed, enhancing T2 and T1 hypointense structure within the right adnexal region is indeterminate. It is unclear whether not this is arising from the right ovary, broad ligament or right side of uterine fundus. Differential considerations include fibrothecoma, versus pedunculated uterine leiomyoma. Metastatic disease is considered less favored. Attention on follow-up imaging is recommended.     01/03/2020 - 05/27/2020 Chemotherapy   First line THP q3weeks starting 01/03/20-05/27/20   01/05/2020 Imaging   US Abdomen  IMPRESSION: 1. Suspect hematoma in the medial right lobe of the liver in the area of previous biopsy. Note that recent MR does not show lesion in this area. This  area has ill-defined margins and measures 3.9 x 4.0 x 3.5 cm. No perihepatic fluid.   2. Probable hemangiomas elsewhere in the right lobe of the liver. Note that several smaller lesions seen on recent MR are not appreciable by ultrasound.   3.  Study otherwise unremarkable.   01/12/2020 Pathology Results   FINAL MICROSCOPIC DIAGNOSIS:   A. BONE, SCLEROTIC LESION, LEFT ANTERIOR ILIAC SPINE, BIOPSY:  - Metastatic carcinoma, consistent with patient's clinical history of  primary breast carcinoma  - See comment   COMMENT:  Immunohistochemical stains show that the tumor cells are positive for  CK7 and GATA3; and negative for CK20, consistent with above  interpretation.   PROGNOSTIC INDICATOR RESULTS:  The tumor cells are NEGATIVE for Her2 (0); see comment  Estrogen Receptor: NEGATIVE; see comment  Progesterone Receptor: NEGATIVE; see comment    03/23/2020 Imaging   CT CAP IMPRESSION: 1. Right lower lobe perifissural nodule is slightly more conspicuous on today's study. Close attention on follow-up recommended. 2. Interval progression of segment IV liver lesion, concerning for metastatic progression. The remaining visualized liver lesions are stable to smaller in the interval. 3. Interval evolution in appearance of thoracolumbar spinal lesions and left iliac crest lesion, potentially reflecting response to therapy/healing. No definite new osseous lesions on today's study. 4. New small fluid collection right cul-de-sac. 5. Probable fibroid change in the uterus including exophytic  right fundal lesion. 6. Aortic Atherosclerosis (ICD10-I70.0).   03/31/2020 Imaging   MR ABD IMPRESSION: 1. Enlarging hepatic lesion in hepatic subsegment IV suspicious for hepatic metastasis. The it is uncertain whether the change in the short interval is due to technical factors with different modalities or true enlargement. 2. Lesion in the posterior RIGHT hepatic lobe with imaging features that are  atypical for hemangioma but stability and signs of enhancement on later phase raising this question. Another more classic appearing may angioma seen inferior to this location in hepatic subsegment VI. Attention on follow-up. 3. Heterogeneous enhancement throughout the spine particularly at L3 as exhibited on the prior study with very patchy marrow signal remains suspicious for bony metastatic lesions in this patient with known bone metastases.   04/01/2020 Imaging   Bone scan IMPRESSION: Good response to therapy with resolution of sites of uptake seen previously at the cervical spine and posterior RIGHT tenth rib as well as a diminished degree of uptake at remaining previously identified osseous metastatic foci.   04/28/2020 PET scan   IMPRESSION: 1. No hypermetabolic lesions to suggest active metastatic disease involving the liver or osseous structures. 2. Stable small low-attenuation liver lesions, likely treated disease.   06/18/2020 -  Chemotherapy   Given her excellent response, I switched to maintenance therapy with Herceptin, Perjeta (Phesgo) q3weeks and Letrozole.   06/18/2020 -  Chemotherapy   Zometa starting 06/18/20   06/18/2020 -  Anti-estrogen oral therapy   Letrozole 2.5mg  once dialy.    06/18/2020 - 06/16/2022 Chemotherapy   Patient is on Treatment Plan : BREAST Trastuzumab + Pertuzumab q21d     09/03/2020 Imaging   Bone Scan IMPRESSION: 1. Significant interval improvement compared to previous studies. Only mild uptake remains in the pelvis as above. There has been significant interval improvement over time.  CT C/A/P IMPRESSION: 1. Hypodense liver lesions are slightly decreased in size compared to prior examination, consistent with treatment response of hepatic metastatic disease. 2. Unchanged sclerotic lesion of the anterior left iliac crest. Unchanged lytic superior endplate deformity of L3, which remains equivocal for metastatic lesion versus mechanical  Schmorl deformity. No CT evidence of new osseous metastatic disease. 3. No evidence of new metastatic disease in the chest, abdomen, or pelvis. 4. Coronary artery disease.   05/05/2022 Imaging    IMPRESSION: 1. Interval decreased size of the largest hepatic metastatic lesion with stable size of the additional subcentimeter bilobar hepatic lesions. Continued decrease in size of the bilobar hepatic metastases. 2. Similar appearance of the osseous metatatic disease. No new aggressive lytic or blastic lesion of bone. 3. No evidence of new or progressive metastatic disease in the chest, abdomen or pelvis.     05/05/2022 Imaging    IMPRESSION: Faint residual radiotracer uptake in the left hemipelvis.   New focus of mild abnormal radiotracer uptake in the right lateral seventh rib without discrete CT correlate on same day examination, nonspecific suggest correlation with history of interval trauma.     07/07/2022 -  Chemotherapy   Patient is on Treatment Plan : BREAST Trastuzumab + Pertuzumab q21d        INTERVAL HISTORY:  Carolyn Stewart is here for a follow up of metastatic breast cancer . She was last seen by me on 07/07/2022. She presents to the clinic alone. Pt has no issues with the injection. Pt had an echo done and it was excellent. Pt reports of left hip pain that's intermittent.     All other systems were reviewed with  the patient and are negative.  MEDICAL HISTORY:  Past Medical History:  Diagnosis Date   Cancer (HCC) 2021   Hypertension     SURGICAL HISTORY: Past Surgical History:  Procedure Laterality Date   left parotid tumor removal   2006   PORTACATH PLACEMENT Left 12/15/2019   Procedure: INSERTION PORT-A-CATH WITH ULTRASOUND GUIDANCE;  Surgeon: Almond Lint, MD;  Location: [facility] OR;  Service: General;  Laterality: Left;   TONSILLECTOMY      I have reviewed the social history and family history with the patient and they are unchanged from previous  note.  ALLERGIES:  has No Known Allergies.  MEDICATIONS:  Current Outpatient Medications  Medication Sig Dispense Refill   acetaminophen (TYLENOL) 500 MG tablet Take 1,000 mg by mouth every 6 (six) hours as needed for moderate pain or headache.     amLODipine (NORVASC) 10 MG tablet Take 2 tablets (20 mg total) by mouth daily. 60 tablet 11   Cholecalciferol (VITAMIN D3) 50 MCG (2000 UT) TABS Take 2,000 Units by mouth daily.      gabapentin (NEURONTIN) 100 MG capsule TAKE 2 CAPSULE BY MOUTH AT BEDTIME 60 capsule 2   letrozole (FEMARA) 2.5 MG tablet Take 1 tablet (2.5 mg total) by mouth daily. 90 tablet 1   lidocaine-prilocaine (EMLA) cream Apply 1 application topically as needed. 30 g 1   losartan (COZAAR) 100 MG tablet Take 1 tablet (100 mg total) by mouth daily. 90 tablet 3   metoprolol succinate (TOPROL-XL) 100 MG 24 hr tablet Take 1 tablet (100 mg total) by mouth daily. 30 tablet 11   omeprazole (PRILOSEC OTC) 20 MG tablet Take 20 mg by mouth daily.     potassium chloride (KLOR-CON M) 10 MEQ tablet Take 1 tablet (10 mEq total) by mouth daily. 90 tablet 1   No current facility-administered medications for this visit.   Facility-Administered Medications Ordered in Other Visits  Medication Dose Route Frequency Provider Last Rate Last Admin   pertuz-trastuz-hyaluron-zzxf (PHESGO) 600-600-20000 MG-MG-U/10ML chemo SQ injection maintenance dose 10 mL  10 mL Subcutaneous Once Malachy Mood, MD        PHYSICAL EXAMINATION: ECOG PERFORMANCE STATUS: 0 - Asymptomatic  Vitals:   09/08/22 0902  BP: 120/71  Pulse: 83  Resp: 18  Temp: 98 F (36.7 C)  SpO2: 99%   Wt Readings from Last 3 Encounters:  09/08/22 202 lb 9.6 oz (91.9 kg)  08/31/22 201 lb (91.2 kg)  08/18/22 201 lb 6.4 oz (91.4 kg)     GENERAL:alert, no distress and comfortable SKIN: skin color normal, no rashes or significant lesions EYES: normal, Conjunctiva are pink and non-injected, sclera clear  NEURO: alert & oriented x 3  with fluent speech LYMPH:  (-) no palpable lymphadenopathy in the cervical, axillary  ABDOMEN:(-)abdomen soft, (-)non-tender and (-)normal bowel sounds Musculoskeletal:no cyanosis of digits and no clubbing  NEURO: alert & oriented x 3 with fluent speech, no focal motor/sensory deficits  LABORATORY DATA:  I have reviewed the data as listed    Latest Ref Rng & Units 09/08/2022    8:38 AM 07/07/2022    9:39 AM 05/05/2022    9:07 AM  CBC  WBC 4.0 - 10.5 K/uL 5.8  6.6  5.5   Hemoglobin 12.0 - 15.0 g/dL 16.1  09.6  04.5   Hematocrit 36.0 - 46.0 % 39.9  38.1  40.0   Platelets 150 - 400 K/uL 285  293  297         Latest  Ref Rng & Units 09/08/2022    8:38 AM 08/18/2022    9:04 AM 07/07/2022    9:39 AM  CMP  Glucose 70 - 99 mg/dL 960  454  098   BUN 8 - 23 mg/dL 14  13  15    Creatinine 0.44 - 1.00 mg/dL 1.19  1.47  8.29   Sodium 135 - 145 mmol/L 142  143  142   Potassium 3.5 - 5.1 mmol/L 3.3  3.3  3.5   Chloride 98 - 111 mmol/L 109  108  111   CO2 22 - 32 mmol/L 27  26  27    Calcium 8.9 - 10.3 mg/dL 8.8  8.9  8.9   Total Protein 6.5 - 8.1 g/dL 7.0  6.8  7.1   Total Bilirubin 0.3 - 1.2 mg/dL 0.5  0.5  0.4   Alkaline Phos 38 - 126 U/L 103  90  102   AST 15 - 41 U/L 14  13  14    ALT 0 - 44 U/L 8  8  8        RADIOGRAPHIC STUDIES: I have personally reviewed the radiological images as listed and agreed with the findings in the report. No results found.    Orders Placed This Encounter  Procedures   CT CHEST ABDOMEN PELVIS W CONTRAST    Standing Status:   Future    Standing Expiration Date:   09/08/2023    Order Specific Question:   If indicated for the ordered procedure, I authorize the administration of contrast media per Radiology protocol    Answer:   Yes    Order Specific Question:   Does the patient have a contrast media/X-ray dye allergy?    Answer:   No    Order Specific Question:   Preferred imaging location?    Answer:   [facility]    Order Specific Question:    Release to patient    Answer:   Immediate    Order Specific Question:   If indicated for the ordered procedure, I authorize the administration of oral contrast media per Radiology protocol    Answer:   Yes   MM 3D DIAGNOSTIC MAMMOGRAM BILATERAL BREAST    Standing Status:   Future    Standing Expiration Date:   09/08/2023    Order Specific Question:   Reason for Exam (SYMPTOM  OR DIAGNOSIS REQUIRED)    Answer:   f/u known breast cancer    Order Specific Question:   Preferred imaging location?    Answer:   [facility]   All questions were answered. The patient knows to call the clinic with any problems, questions or concerns. No barriers to learning was detected. The total time spent in the appointment was 25 minutes.     Malachy Mood, MD 09/08/2022   Carolin Coy, CMA, am acting as scribe for Malachy Mood, MD.   I have reviewed the above documentation for accuracy and completeness, and I agree with the above.

## 2022-09-08 NOTE — Assessment & Plan Note (Signed)
-  from breast cancer  --bone scan on 12/10/19 showed metastatic disease in left acetabulum, L3, and right 10th rib.  --Zometa started 06/18/20, but held after second dose due to loss of insurance. -will restart since she has insurance now  

## 2022-09-08 NOTE — Patient Instructions (Signed)
[facility]   Discharge Instructions: Thank you for choosing [facility] to provide your oncology and hematology care.   If you have a lab appointment with the Cancer Center, please go directly to the Cancer Center and check in at the registration area.   Wear comfortable clothing and clothing appropriate for easy access to any Portacath or PICC line.   We strive to give you quality time with your provider. You may need to reschedule your appointment if you arrive late (15 or more minutes).  Arriving late affects you and other patients whose appointments are after yours.  Also, if you miss three or more appointments without notifying the office, you may be dismissed from the clinic at the provider's discretion.      For prescription refill requests, have your pharmacy contact our office and allow 72 hours for refills to be completed.    Today you received the following chemotherapy and/or immunotherapy agents P[place]go.      To help prevent nausea and vomiting after your treatment, we encourage you to take your nausea medication as directed.  BELOW ARE SYMPTOMS THAT SHOULD BE REPORTED IMMEDIATELY: *FEVER GREATER THAN 100.4 F (38 C) OR HIGHER *CHILLS OR SWEATING *NAUSEA AND VOMITING THAT IS NOT CONTROLLED WITH YOUR NAUSEA MEDICATION *UNUSUAL SHORTNESS OF BREATH *UNUSUAL BRUISING OR BLEEDING *URINARY PROBLEMS (pain or burning when urinating, or frequent urination) *BOWEL PROBLEMS (unusual diarrhea, constipation, pain near the anus) TENDERNESS IN MOUTH AND THROAT WITH OR WITHOUT PRESENCE OF ULCERS (sore throat, sores in mouth, or a toothache) UNUSUAL RASH, SWELLING OR PAIN  UNUSUAL VAGINAL DISCHARGE OR ITCHING   Items with * indicate a potential emergency and should be followed up as soon as possible or go to the Emergency Department if any problems should occur.  Please show the CHEMOTHERAPY ALERT CARD or IMMUNOTHERAPY ALERT CARD at  check-in to the Emergency Department and triage nurse.  Should you have questions after your visit or need to cancel or reschedule your appointment, please contact [facility]  Dept: [phone]  and follow the prompts.  Office hours are 8:00 a.m. to 4:30 p.m. Monday - Friday. Please note that voicemails left after 4:00 p.m. may not be returned until the following business day.  We are closed weekends and major holidays. You have access to a nurse at all times for urgent questions. Please call the main number to the clinic Dept: [phone] and follow the prompts.   For any non-urgent questions, you may also contact your provider using MyChart. We now offer e-Visits for anyone 55 and older to request care online for non-urgent symptoms. For details visit [email or web address].   Also download the MyChart app! Go to the app store, search "MyChart", open the app, select [facility], and log in with your MyChart username and password.  Pertuzumab; Trastuzumab; Hyaluronidase Injection What is this medication? PERTUZUMAB; TRASTUZUMAB; HYALURONIDASE (per TOOZ ue mab; tras TOO zoo mab; hye al ur ON i dase) treats breast cancer. Pertuzumab and trastuzumab work by blocking a protein that causes cancer cells to grow and multiply. This helps to slow or stop the spread of cancer cells. Hyaluronidase works by increasing the absorption of other medications in the body to help them work better. It is a combination medication that contains two monoclonal antibodies. This medicine may be used for other purposes; ask your health care provider or pharmacist if you have questions. COMMON BRAND NAME(S): PHESGO  What should I tell my care team before I take this medication? They need to know if you have any of these conditions: Heart failure High blood pressure Irregular heartbeat or rhythm Lung disease An unusual or allergic reaction to pertuzumab, trastuzumab, hyaluronidase,  other medications, foods, dyes, or preservatives Pregnant or trying to get pregnant Breast-feeding How should I use this medication? This medication is injected under the skin. It is usually given by your care team in a hospital or clinic setting. It may also be given at home by your care team. Talk to your care team about the use of this medication in children. Special care may be needed. Overdosage: If you think you have taken too much of this medicine contact a poison control center or emergency room at once. NOTE: This medicine is only for you. Do not share this medicine with others. What if I miss a dose? Keep appointments for follow-up doses. It is important not to miss your dose. Call your care team if you are unable to keep an appointment. What may interact with this medication? Certain types of chemotherapy, such as daunorubicin, doxorubicin, epirubicin, idarubicin This list may not describe all possible interactions. Give your health care provider a list of all the medicines, herbs, non-prescription drugs, or dietary supplements you use. Also tell them if you smoke, drink alcohol, or use illegal drugs. Some items may interact with your medicine. What should I watch for while using this medication? Your condition will be monitored carefully while you are receiving this medication. This medication may make you feel generally unwell. This is not uncommon as chemotherapy can affect healthy cells as well as cancer cells. Report any side effects. Continue your course of treatment even though you feel ill unless your care team tells you to stop. This medication may increase your risk of getting an infection. Call your care team for advice if you get a fever, chills, sore throat, or other symptoms of a cold or flu. Do not treat yourself. Try to avoid being around people who are sick. Avoid taking medications that contain aspirin, acetaminophen, ibuprofen, naproxen, or ketoprofen unless instructed  by your care team. These medications may hide a fever. Talk to your care team if you may be pregnant. Serious birth defects can occur if you take this medication during pregnancy and for 7 months after the last dose. You will need a negative pregnancy test before starting this medication. Contraception is recommended while taking this medication and for 7 months after the last dose. Your care team can help you find the option that works for you. Do not breastfeed while taking this medication and for 7 months after the last dose. What side effects may I notice from receiving this medication? Side effects that you should report to your care team as soon as possible: Allergic reactions or angioedema--skin rash, itching or hives, swelling of the face, eyes, lips, tongue, arms, or legs, trouble swallowing or breathing Dry cough, shortness of breath or trouble breathing Heart failure--shortness of breath, swelling of the ankles, feet, or hands, sudden weight gain, unusual weakness or fatigue Infection--fever, chills, cough, or sore throat Infusion reactions--chest pain, shortness of breath or trouble breathing, feeling faint or lightheaded Side effects that usually do not require medical attention (report these to your care team if they continue or are bothersome): Diarrhea Hair loss Nausea Pain, redness, or irritation at injection site Pain, redness, or swelling with sores inside the mouth or throat Unusual weakness or fatigue This  list may not describe all possible side effects. Call your doctor for medical advice about side effects. You may report side effects to FDA at 1-800-FDA-1088. Where should I keep my medication? This medication is given in a hospital or clinic. It will not be stored at home. NOTE: This sheet is a summary. It may not cover all possible information. If you have questions about this medicine, talk to your doctor, pharmacist, or health care provider.  2023 Elsevier/Gold  Standard (2021-08-11 00:00:00) Zoledronic Acid Injection (Cancer) What is this medication? ZOLEDRONIC ACID (ZOE le dron ik AS id) treats high calcium levels in the blood caused by cancer. It may also be used with chemotherapy to treat weakened bones caused by cancer. It works by slowing down the release of calcium from bones. This lowers calcium levels in your blood. It also makes your bones stronger and less likely to break (fracture). It belongs to a group of medications called bisphosphonates. This medicine may be used for other purposes; ask your health care provider or pharmacist if you have questions. COMMON BRAND NAME(S): Zometa, Zometa Powder What should I tell my care team before I take this medication? They need to know if you have any of these conditions: Dehydration Dental disease Kidney disease Liver disease Low levels of calcium in the blood Lung or breathing disease, such as asthma Receiving steroids, such as dexamethasone or prednisone An unusual or allergic reaction to zoledronic acid, other medications, foods, dyes, or preservatives Pregnant or trying to get pregnant Breast-feeding How should I use this medication? This medication is injected into a vein. It is given by your care team in a hospital or clinic setting. Talk to your care team about the use of this medication in children. Special care may be needed. Overdosage: If you think you have taken too much of this medicine contact a poison control center or emergency room at once. NOTE: This medicine is only for you. Do not share this medicine with others. What if I miss a dose? Keep appointments for follow-up doses. It is important not to miss your dose. Call your care team if you are unable to keep an appointment. What may interact with this medication? Certain antibiotics given by injection Diuretics, such as bumetanide, furosemide NSAIDs, medications for pain and inflammation, such as ibuprofen or  naproxen Teriparatide Thalidomide This list may not describe all possible interactions. Give your health care provider a list of all the medicines, herbs, non-prescription drugs, or dietary supplements you use. Also tell them if you smoke, drink alcohol, or use illegal drugs. Some items may interact with your medicine. What should I watch for while using this medication? Visit your care team for regular checks on your progress. It may be some time before you see the benefit from this medication. Some people who take this medication have severe bone, joint, or muscle pain. This medication may also increase your risk for jaw problems or a broken thigh bone. Tell your care team right away if you have severe pain in your jaw, bones, joints, or muscles. Tell you care team if you have any pain that does not go away or that gets worse. Tell your dentist and dental surgeon that you are taking this medication. You should not have major dental surgery while on this medication. See your dentist to have a dental exam and fix any dental problems before starting this medication. Take good care of your teeth while on this medication. Make sure you see your dentist for  regular follow-up appointments. You should make sure you get enough calcium and vitamin D while you are taking this medication. Discuss the foods you eat and the vitamins you take with your care team. Check with your care team if you have severe diarrhea, nausea, and vomiting, or if you sweat a lot. The loss of too much body fluid may make it dangerous for you to take this medication. You may need bloodwork while taking this medication. Talk to your care team if you wish to become pregnant or think you might be pregnant. This medication can cause serious birth defects. What side effects may I notice from receiving this medication? Side effects that you should report to your care team as soon as possible: Allergic reactions--skin rash, itching, hives,  swelling of the face, lips, tongue, or throat Kidney injury--decrease in the amount of urine, swelling of the ankles, hands, or feet Low calcium level--muscle pain or cramps, confusion, tingling, or numbness in the hands or feet Osteonecrosis of the jaw--pain, swelling, or redness in the mouth, numbness of the jaw, poor healing after dental work, unusual discharge from the mouth, visible bones in the mouth Severe bone, joint, or muscle pain Side effects that usually do not require medical attention (report to your care team if they continue or are bothersome): Constipation Fatigue Fever Loss of appetite Nausea Stomach pain This list may not describe all possible side effects. Call your doctor for medical advice about side effects. You may report side effects to FDA at 1-800-FDA-1088. Where should I keep my medication? This medication is given in a hospital or clinic. It will not be stored at home. NOTE: This sheet is a summary. It may not cover all possible information. If you have questions about this medicine, talk to your doctor, pharmacist, or health care provider.  2023 Elsevier/Gold Standard (2007-06-01 00:00:00)

## 2022-09-09 LAB — CANCER ANTIGEN 27.29: CA 27.29: 39.2 U/mL — ABNORMAL HIGH (ref 0.0–38.6)

## 2022-09-12 ENCOUNTER — Other Ambulatory Visit: Payer: Self-pay

## 2022-09-25 ENCOUNTER — Encounter: Payer: Self-pay | Admitting: Hematology

## 2022-09-26 ENCOUNTER — Other Ambulatory Visit: Payer: Self-pay | Admitting: Hematology

## 2022-09-26 ENCOUNTER — Ambulatory Visit
Admission: RE | Admit: 2022-09-26 | Discharge: 2022-09-26 | Disposition: A | Discharge status: Home / Self Care | Payer: Commercial Managed Care - PPO | Source: Ambulatory Visit | Attending: Hematology | Admitting: Hematology

## 2022-09-26 DIAGNOSIS — N6320 Unspecified lump in the left breast, unspecified quadrant: Secondary | ICD-10-CM

## 2022-09-26 DIAGNOSIS — Z170 Estrogen receptor positive status [ER+]: Secondary | ICD-10-CM

## 2022-09-29 ENCOUNTER — Encounter: Payer: Self-pay | Admitting: Hematology

## 2022-09-29 ENCOUNTER — Inpatient Hospital Stay: Payer: Commercial Managed Care - PPO | Attending: Hematology

## 2022-09-29 VITALS — BP 111/64 | HR 84 | Temp 98.2°F | Resp 18 | Ht 67.0 in | Wt 201.9 lb

## 2022-09-29 DIAGNOSIS — Z5111 Encounter for antineoplastic chemotherapy: Secondary | ICD-10-CM | POA: Diagnosis present

## 2022-09-29 DIAGNOSIS — Z170 Estrogen receptor positive status [ER+]: Secondary | ICD-10-CM | POA: Diagnosis not present

## 2022-09-29 DIAGNOSIS — C7951 Secondary malignant neoplasm of bone: Secondary | ICD-10-CM | POA: Insufficient documentation

## 2022-09-29 DIAGNOSIS — C50411 Malignant neoplasm of upper-outer quadrant of right female breast: Secondary | ICD-10-CM | POA: Insufficient documentation

## 2022-09-29 MED ORDER — DIPHENHYDRAMINE HCL 25 MG PO CAPS
50.0000 mg | ORAL_CAPSULE | Freq: Once | ORAL | Status: AC
  Administered 2022-09-29: 50 mg via ORAL
  Filled 2022-09-29: qty 2

## 2022-09-29 MED ORDER — ACETAMINOPHEN 325 MG PO TABS
650.0000 mg | ORAL_TABLET | Freq: Once | ORAL | Status: AC
  Administered 2022-09-29: 650 mg via ORAL
  Filled 2022-09-29: qty 2

## 2022-09-29 MED ORDER — PERTUZ-TRASTUZ-HYALURON-ZZXF 60-60-2000 MG-MG-U/ML CHEMO ~~LOC~~ SOLN
10.0000 mL | Freq: Once | SUBCUTANEOUS | Status: AC
  Administered 2022-09-29: 10 mL via SUBCUTANEOUS
  Filled 2022-09-29: qty 10

## 2022-09-29 NOTE — Patient Instructions (Signed)
[facility]   Discharge Instructions: Thank you for choosing [facility] to provide your oncology and hematology care.   If you have a lab appointment with the Cancer Center, please go directly to the Cancer Center and check in at the registration area.   Wear comfortable clothing and clothing appropriate for easy access to any Portacath or PICC line.   We strive to give you quality time with your provider. You may need to reschedule your appointment if you arrive late (15 or more minutes).  Arriving late affects you and other patients whose appointments are after yours.  Also, if you miss three or more appointments without notifying the office, you may be dismissed from the clinic at the provider's discretion.      For prescription refill requests, have your pharmacy contact our office and allow 72 hours for refills to be completed.    Today you received the following chemotherapy and/or immunotherapy agents [place].      To help prevent nausea and vomiting after your treatment, we encourage you to take your nausea medication as directed.  BELOW ARE SYMPTOMS THAT SHOULD BE REPORTED IMMEDIATELY: *FEVER GREATER THAN 100.4 F (38 C) OR HIGHER *CHILLS OR SWEATING *NAUSEA AND VOMITING THAT IS NOT CONTROLLED WITH YOUR NAUSEA MEDICATION *UNUSUAL SHORTNESS OF BREATH *UNUSUAL BRUISING OR BLEEDING *URINARY PROBLEMS (pain or burning when urinating, or frequent urination) *BOWEL PROBLEMS (unusual diarrhea, constipation, pain near the anus) TENDERNESS IN MOUTH AND THROAT WITH OR WITHOUT PRESENCE OF ULCERS (sore throat, sores in mouth, or a toothache) UNUSUAL RASH, SWELLING OR PAIN  UNUSUAL VAGINAL DISCHARGE OR ITCHING   Items with * indicate a potential emergency and should be followed up as soon as possible or go to the Emergency Department if any problems should occur.  Please show the CHEMOTHERAPY ALERT CARD or IMMUNOTHERAPY ALERT CARD at check-in  to the Emergency Department and triage nurse.  Should you have questions after your visit or need to cancel or reschedule your appointment, please contact [facility]  Dept: [phone]  and follow the prompts.  Office hours are 8:00 a.m. to 4:30 p.m. Monday - Friday. Please note that voicemails left after 4:00 p.m. may not be returned until the following business day.  We are closed weekends and major holidays. You have access to a nurse at all times for urgent questions. Please call the main number to the clinic Dept: [phone] and follow the prompts.   For any non-urgent questions, you may also contact your provider using MyChart. We now offer e-Visits for anyone 18 and older to request care online for non-urgent symptoms. For details visit [email or web address].   Also download the MyChart app! Go to the app store, search "MyChart", open the app, select [facility], and log in with your MyChart username and password.  Pertuzumab; Trastuzumab; Hyaluronidase Injection What is this medication? PERTUZUMAB; TRASTUZUMAB; HYALURONIDASE (per TOOZ ue mab; tras TOO zoo mab; hye al ur ON i dase) treats breast cancer. Pertuzumab and trastuzumab work by blocking a protein that causes cancer cells to grow and multiply. This helps to slow or stop the spread of cancer cells. Hyaluronidase works by increasing the absorption of other medications in the body to help them work better. It is a combination medication that contains two monoclonal antibodies. This medicine may be used for other purposes; ask your health care provider or pharmacist if you have questions. COMMON BRAND NAME(S): PHESGO What should   I tell my care team before I take this medication? They need to know if you have any of these conditions: Heart failure High blood pressure Irregular heartbeat or rhythm Lung disease An unusual or allergic reaction to pertuzumab, trastuzumab, hyaluronidase, other  medications, foods, dyes, or preservatives Pregnant or trying to get pregnant Breast-feeding How should I use this medication? This medication is injected under the skin. It is usually given by your care team in a hospital or clinic setting. It may also be given at home by your care team. Talk to your care team about the use of this medication in children. Special care may be needed. Overdosage: If you think you have taken too much of this medicine contact a poison control center or emergency room at once. NOTE: This medicine is only for you. Do not share this medicine with others. What if I miss a dose? Keep appointments for follow-up doses. It is important not to miss your dose. Call your care team if you are unable to keep an appointment. What may interact with this medication? Certain types of chemotherapy, such as daunorubicin, doxorubicin, epirubicin, idarubicin This list may not describe all possible interactions. Give your health care provider a list of all the medicines, herbs, non-prescription drugs, or dietary supplements you use. Also tell them if you smoke, drink alcohol, or use illegal drugs. Some items may interact with your medicine. What should I watch for while using this medication? Your condition will be monitored carefully while you are receiving this medication. This medication may make you feel generally unwell. This is not uncommon as chemotherapy can affect healthy cells as well as cancer cells. Report any side effects. Continue your course of treatment even though you feel ill unless your care team tells you to stop. This medication may increase your risk of getting an infection. Call your care team for advice if you get a fever, chills, sore throat, or other symptoms of a cold or flu. Do not treat yourself. Try to avoid being around people who are sick. Avoid taking medications that contain aspirin, acetaminophen, ibuprofen, naproxen, or ketoprofen unless instructed by  your care team. These medications may hide a fever. Talk to your care team if you may be pregnant. Serious birth defects can occur if you take this medication during pregnancy and for 7 months after the last dose. You will need a negative pregnancy test before starting this medication. Contraception is recommended while taking this medication and for 7 months after the last dose. Your care team can help you find the option that works for you. Do not breastfeed while taking this medication and for 7 months after the last dose. What side effects may I notice from receiving this medication? Side effects that you should report to your care team as soon as possible: Allergic reactions or angioedema--skin rash, itching or hives, swelling of the face, eyes, lips, tongue, arms, or legs, trouble swallowing or breathing Dry cough, shortness of breath or trouble breathing Heart failure--shortness of breath, swelling of the ankles, feet, or hands, sudden weight gain, unusual weakness or fatigue Infection--fever, chills, cough, or sore throat Infusion reactions--chest pain, shortness of breath or trouble breathing, feeling faint or lightheaded Side effects that usually do not require medical attention (report these to your care team if they continue or are bothersome): Diarrhea Hair loss Nausea Pain, redness, or irritation at injection site Pain, redness, or swelling with sores inside the mouth or throat Unusual weakness or fatigue This list may   not describe all possible side effects. Call your doctor for medical advice about side effects. You may report side effects to FDA at 1-800-FDA-1088. Where should I keep my medication? This medication is given in a hospital or clinic. It will not be stored at home. NOTE: This sheet is a summary. It may not cover all possible information. If you have questions about this medicine, talk to your doctor, pharmacist, or health care provider.  2023 Elsevier/Gold Standard  (2021-08-11 00:00:00) Zoledronic Acid Injection (Cancer) What is this medication? ZOLEDRONIC ACID (ZOE le dron ik AS id) treats high calcium levels in the blood caused by cancer. It may also be used with chemotherapy to treat weakened bones caused by cancer. It works by slowing down the release of calcium from bones. This lowers calcium levels in your blood. It also makes your bones stronger and less likely to break (fracture). It belongs to a group of medications called bisphosphonates. This medicine may be used for other purposes; ask your health care provider or pharmacist if you have questions. COMMON BRAND NAME(S): Zometa, Zometa Powder What should I tell my care team before I take this medication? They need to know if you have any of these conditions: Dehydration Dental disease Kidney disease Liver disease Low levels of calcium in the blood Lung or breathing disease, such as asthma Receiving steroids, such as dexamethasone or prednisone An unusual or allergic reaction to zoledronic acid, other medications, foods, dyes, or preservatives Pregnant or trying to get pregnant Breast-feeding How should I use this medication? This medication is injected into a vein. It is given by your care team in a hospital or clinic setting. Talk to your care team about the use of this medication in children. Special care may be needed. Overdosage: If you think you have taken too much of this medicine contact a poison control center or emergency room at once. NOTE: This medicine is only for you. Do not share this medicine with others. What if I miss a dose? Keep appointments for follow-up doses. It is important not to miss your dose. Call your care team if you are unable to keep an appointment. What may interact with this medication? Certain antibiotics given by injection Diuretics, such as bumetanide, furosemide NSAIDs, medications for pain and inflammation, such as ibuprofen or  naproxen Teriparatide Thalidomide This list may not describe all possible interactions. Give your health care provider a list of all the medicines, herbs, non-prescription drugs, or dietary supplements you use. Also tell them if you smoke, drink alcohol, or use illegal drugs. Some items may interact with your medicine. What should I watch for while using this medication? Visit your care team for regular checks on your progress. It may be some time before you see the benefit from this medication. Some people who take this medication have severe bone, joint, or muscle pain. This medication may also increase your risk for jaw problems or a broken thigh bone. Tell your care team right away if you have severe pain in your jaw, bones, joints, or muscles. Tell you care team if you have any pain that does not go away or that gets worse. Tell your dentist and dental surgeon that you are taking this medication. You should not have major dental surgery while on this medication. See your dentist to have a dental exam and fix any dental problems before starting this medication. Take good care of your teeth while on this medication. Make sure you see your dentist for regular follow-up   appointments. You should make sure you get enough calcium and vitamin D while you are taking this medication. Discuss the foods you eat and the vitamins you take with your care team. Check with your care team if you have severe diarrhea, nausea, and vomiting, or if you sweat a lot. The loss of too much body fluid may make it dangerous for you to take this medication. You may need bloodwork while taking this medication. Talk to your care team if you wish to become pregnant or think you might be pregnant. This medication can cause serious birth defects. What side effects may I notice from receiving this medication? Side effects that you should report to your care team as soon as possible: Allergic reactions--skin rash, itching, hives,  swelling of the face, lips, tongue, or throat Kidney injury--decrease in the amount of urine, swelling of the ankles, hands, or feet Low calcium level--muscle pain or cramps, confusion, tingling, or numbness in the hands or feet Osteonecrosis of the jaw--pain, swelling, or redness in the mouth, numbness of the jaw, poor healing after dental work, unusual discharge from the mouth, visible bones in the mouth Severe bone, joint, or muscle pain Side effects that usually do not require medical attention (report to your care team if they continue or are bothersome): Constipation Fatigue Fever Loss of appetite Nausea Stomach pain This list may not describe all possible side effects. Call your doctor for medical advice about side effects. You may report side effects to FDA at 1-800-FDA-1088. Where should I keep my medication? This medication is given in a hospital or clinic. It will not be stored at home. NOTE: This sheet is a summary. It may not cover all possible information. If you have questions about this medicine, talk to your doctor, pharmacist, or health care provider.  2023 Elsevier/Gold Standard (2007-06-01 00:00:00)  

## 2022-10-20 ENCOUNTER — Inpatient Hospital Stay: Payer: Commercial Managed Care - PPO

## 2022-10-20 VITALS — BP 116/67 | HR 78 | Temp 98.1°F | Resp 18 | Ht 67.0 in | Wt 201.0 lb

## 2022-10-20 DIAGNOSIS — Z170 Estrogen receptor positive status [ER+]: Secondary | ICD-10-CM

## 2022-10-20 DIAGNOSIS — Z5111 Encounter for antineoplastic chemotherapy: Secondary | ICD-10-CM | POA: Diagnosis not present

## 2022-10-20 MED ORDER — PERTUZ-TRASTUZ-HYALURON-ZZXF 60-60-2000 MG-MG-U/ML CHEMO ~~LOC~~ SOLN
10.0000 mL | Freq: Once | SUBCUTANEOUS | Status: AC
  Administered 2022-10-20: 10 mL via SUBCUTANEOUS
  Filled 2022-10-20: qty 10

## 2022-10-20 MED ORDER — ACETAMINOPHEN 325 MG PO TABS
650.0000 mg | ORAL_TABLET | Freq: Once | ORAL | Status: AC
  Administered 2022-10-20: 650 mg via ORAL
  Filled 2022-10-20: qty 2

## 2022-10-20 MED ORDER — DIPHENHYDRAMINE HCL 25 MG PO CAPS
50.0000 mg | ORAL_CAPSULE | Freq: Once | ORAL | Status: AC
  Administered 2022-10-20: 50 mg via ORAL
  Filled 2022-10-20: qty 2

## 2022-10-20 NOTE — Patient Instructions (Signed)
[facility]   Discharge Instructions: Thank you for choosing [facility] to provide your oncology and hematology care.   If you have a lab appointment with the Cancer Center, please go directly to the Cancer Center and check in at the registration area.   Wear comfortable clothing and clothing appropriate for easy access to any Portacath or PICC line.   We strive to give you quality time with your provider. You may need to reschedule your appointment if you arrive late (15 or more minutes).  Arriving late affects you and other patients whose appointments are after yours.  Also, if you miss three or more appointments without notifying the office, you may be dismissed from the clinic at the provider's discretion.      For prescription refill requests, have your pharmacy contact our office and allow 72 hours for refills to be completed.    Today you received the following chemotherapy and/or immunotherapy agents Phesgo.      To help prevent nausea and vomiting after your treatment, we encourage you to take your nausea medication as directed.  BELOW ARE SYMPTOMS THAT SHOULD BE REPORTED IMMEDIATELY: *FEVER GREATER THAN 100.4 F (38 C) OR HIGHER *CHILLS OR SWEATING *NAUSEA AND VOMITING THAT IS NOT CONTROLLED WITH YOUR NAUSEA MEDICATION *UNUSUAL SHORTNESS OF BREATH *UNUSUAL BRUISING OR BLEEDING *URINARY PROBLEMS (pain or burning when urinating, or frequent urination) *BOWEL PROBLEMS (unusual diarrhea, constipation, pain near the anus) TENDERNESS IN MOUTH AND THROAT WITH OR WITHOUT PRESENCE OF ULCERS (sore throat, sores in mouth, or a toothache) UNUSUAL RASH, SWELLING OR PAIN  UNUSUAL VAGINAL DISCHARGE OR ITCHING   Items with * indicate a potential emergency and should be followed up as soon as possible or go to the Emergency Department if any problems should occur.  Please show the CHEMOTHERAPY ALERT CARD or IMMUNOTHERAPY ALERT CARD at check-in  to the Emergency Department and triage nurse.  Should you have questions after your visit or need to cancel or reschedule your appointment, please contact [facility]  Dept: [phone]  and follow the prompts.  Office hours are 8:00 a.m. to 4:30 p.m. Monday - Friday. Please note that voicemails left after 4:00 p.m. may not be returned until the following business day.  We are closed weekends and major holidays. You have access to a nurse at all times for urgent questions. Please call the main number to the clinic Dept: [phone] and follow the prompts.   For any non-urgent questions, you may also contact your provider using MyChart. We now offer e-Visits for anyone 18 and older to request care online for non-urgent symptoms. For details visit [email or web address].   Also download the MyChart app! Go to the app store, search "MyChart", open the app, select [place], and log in with your MyChart username and password.  Pertuzumab; Trastuzumab; Hyaluronidase Injection What is this medication? PERTUZUMAB; TRASTUZUMAB; HYALURONIDASE (per TOOZ ue mab; tras TOO zoo mab; hye al ur ON i dase) treats breast cancer. Pertuzumab and trastuzumab work by blocking a protein that causes cancer cells to grow and multiply. This helps to slow or stop the spread of cancer cells. Hyaluronidase works by increasing the absorption of other medications in the body to help them work better. It is a combination medication that contains two monoclonal antibodies. This medicine may be used for other purposes; ask your health care provider or pharmacist if you have questions. COMMON BRAND NAME(S): PHESGO What should   I tell my care team before I take this medication? They need to know if you have any of these conditions: Heart failure High blood pressure Irregular heartbeat or rhythm Lung disease An unusual or allergic reaction to pertuzumab, trastuzumab, hyaluronidase, other  medications, foods, dyes, or preservatives Pregnant or trying to get pregnant Breast-feeding How should I use this medication? This medication is injected under the skin. It is usually given by your care team in a hospital or clinic setting. It may also be given at home by your care team. Talk to your care team about the use of this medication in children. Special care may be needed. Overdosage: If you think you have taken too much of this medicine contact a poison control center or emergency room at once. NOTE: This medicine is only for you. Do not share this medicine with others. What if I miss a dose? Keep appointments for follow-up doses. It is important not to miss your dose. Call your care team if you are unable to keep an appointment. What may interact with this medication? Certain types of chemotherapy, such as daunorubicin, doxorubicin, epirubicin, idarubicin This list may not describe all possible interactions. Give your health care provider a list of all the medicines, herbs, non-prescription drugs, or dietary supplements you use. Also tell them if you smoke, drink alcohol, or use illegal drugs. Some items may interact with your medicine. What should I watch for while using this medication? Your condition will be monitored carefully while you are receiving this medication. This medication may make you feel generally unwell. This is not uncommon as chemotherapy can affect healthy cells as well as cancer cells. Report any side effects. Continue your course of treatment even though you feel ill unless your care team tells you to stop. This medication may increase your risk of getting an infection. Call your care team for advice if you get a fever, chills, sore throat, or other symptoms of a cold or flu. Do not treat yourself. Try to avoid being around people who are sick. Avoid taking medications that contain aspirin, acetaminophen, ibuprofen, naproxen, or ketoprofen unless instructed by  your care team. These medications may hide a fever. Talk to your care team if you may be pregnant. Serious birth defects can occur if you take this medication during pregnancy and for 7 months after the last dose. You will need a negative pregnancy test before starting this medication. Contraception is recommended while taking this medication and for 7 months after the last dose. Your care team can help you find the option that works for you. Do not breastfeed while taking this medication and for 7 months after the last dose. What side effects may I notice from receiving this medication? Side effects that you should report to your care team as soon as possible: Allergic reactions or angioedema--skin rash, itching or hives, swelling of the face, eyes, lips, tongue, arms, or legs, trouble swallowing or breathing Dry cough, shortness of breath or trouble breathing Heart failure--shortness of breath, swelling of the ankles, feet, or hands, sudden weight gain, unusual weakness or fatigue Infection--fever, chills, cough, or sore throat Infusion reactions--chest pain, shortness of breath or trouble breathing, feeling faint or lightheaded Side effects that usually do not require medical attention (report these to your care team if they continue or are bothersome): Diarrhea Hair loss Nausea Pain, redness, or irritation at injection site Pain, redness, or swelling with sores inside the mouth or throat Unusual weakness or fatigue This list may   not describe all possible side effects. Call your doctor for medical advice about side effects. You may report side effects to FDA at 1-800-FDA-1088. Where should I keep my medication? This medication is given in a hospital or clinic. It will not be stored at home. NOTE: This sheet is a summary. It may not cover all possible information. If you have questions about this medicine, talk to your doctor, pharmacist, or health care provider.  2023 Elsevier/Gold Standard  (2021-08-11 00:00:00) Zoledronic Acid Injection (Cancer) What is this medication? ZOLEDRONIC ACID (ZOE le dron ik AS id) treats high calcium levels in the blood caused by cancer. It may also be used with chemotherapy to treat weakened bones caused by cancer. It works by slowing down the release of calcium from bones. This lowers calcium levels in your blood. It also makes your bones stronger and less likely to break (fracture). It belongs to a group of medications called bisphosphonates. This medicine may be used for other purposes; ask your health care provider or pharmacist if you have questions. COMMON BRAND NAME(S): Zometa, Zometa Powder What should I tell my care team before I take this medication? They need to know if you have any of these conditions: Dehydration Dental disease Kidney disease Liver disease Low levels of calcium in the blood Lung or breathing disease, such as asthma Receiving steroids, such as dexamethasone or prednisone An unusual or allergic reaction to zoledronic acid, other medications, foods, dyes, or preservatives Pregnant or trying to get pregnant Breast-feeding How should I use this medication? This medication is injected into a vein. It is given by your care team in a hospital or clinic setting. Talk to your care team about the use of this medication in children. Special care may be needed. Overdosage: If you think you have taken too much of this medicine contact a poison control center or emergency room at once. NOTE: This medicine is only for you. Do not share this medicine with others. What if I miss a dose? Keep appointments for follow-up doses. It is important not to miss your dose. Call your care team if you are unable to keep an appointment. What may interact with this medication? Certain antibiotics given by injection Diuretics, such as bumetanide, furosemide NSAIDs, medications for pain and inflammation, such as ibuprofen or  naproxen Teriparatide Thalidomide This list may not describe all possible interactions. Give your health care provider a list of all the medicines, herbs, non-prescription drugs, or dietary supplements you use. Also tell them if you smoke, drink alcohol, or use illegal drugs. Some items may interact with your medicine. What should I watch for while using this medication? Visit your care team for regular checks on your progress. It may be some time before you see the benefit from this medication. Some people who take this medication have severe bone, joint, or muscle pain. This medication may also increase your risk for jaw problems or a broken thigh bone. Tell your care team right away if you have severe pain in your jaw, bones, joints, or muscles. Tell you care team if you have any pain that does not go away or that gets worse. Tell your dentist and dental surgeon that you are taking this medication. You should not have major dental surgery while on this medication. See your dentist to have a dental exam and fix any dental problems before starting this medication. Take good care of your teeth while on this medication. Make sure you see your dentist for regular follow-up   appointments. You should make sure you get enough calcium and vitamin D while you are taking this medication. Discuss the foods you eat and the vitamins you take with your care team. Check with your care team if you have severe diarrhea, nausea, and vomiting, or if you sweat a lot. The loss of too much body fluid may make it dangerous for you to take this medication. You may need bloodwork while taking this medication. Talk to your care team if you wish to become pregnant or think you might be pregnant. This medication can cause serious birth defects. What side effects may I notice from receiving this medication? Side effects that you should report to your care team as soon as possible: Allergic reactions--skin rash, itching, hives,  swelling of the face, lips, tongue, or throat Kidney injury--decrease in the amount of urine, swelling of the ankles, hands, or feet Low calcium level--muscle pain or cramps, confusion, tingling, or numbness in the hands or feet Osteonecrosis of the jaw--pain, swelling, or redness in the mouth, numbness of the jaw, poor healing after dental work, unusual discharge from the mouth, visible bones in the mouth Severe bone, joint, or muscle pain Side effects that usually do not require medical attention (report to your care team if they continue or are bothersome): Constipation Fatigue Fever Loss of appetite Nausea Stomach pain This list may not describe all possible side effects. Call your doctor for medical advice about side effects. You may report side effects to FDA at 1-800-FDA-1088. Where should I keep my medication? This medication is given in a hospital or clinic. It will not be stored at home. NOTE: This sheet is a summary. It may not cover all possible information. If you have questions about this medicine, talk to your doctor, pharmacist, or health care provider.  2023 Elsevier/Gold Standard (2007-06-01 00:00:00)  

## 2022-11-09 ENCOUNTER — Ambulatory Visit (HOSPITAL_COMMUNITY)
Admission: RE | Admit: 2022-11-09 | Discharge: 2022-11-09 | Disposition: A | Discharge status: Home / Self Care | Payer: Commercial Managed Care - PPO | Source: Ambulatory Visit | Attending: Hematology | Admitting: Hematology

## 2022-11-09 DIAGNOSIS — C7951 Secondary malignant neoplasm of bone: Secondary | ICD-10-CM

## 2022-11-09 DIAGNOSIS — C50411 Malignant neoplasm of upper-outer quadrant of right female breast: Secondary | ICD-10-CM

## 2022-11-09 DIAGNOSIS — Z170 Estrogen receptor positive status [ER+]: Secondary | ICD-10-CM | POA: Insufficient documentation

## 2022-11-09 MED ORDER — IOHEXOL 300 MG/ML  SOLN
100.0000 mL | Freq: Once | INTRAMUSCULAR | Status: AC | PRN
  Administered 2022-11-09: 100 mL via INTRAVENOUS

## 2022-11-10 ENCOUNTER — Encounter: Payer: Self-pay | Admitting: Hematology

## 2022-11-10 ENCOUNTER — Inpatient Hospital Stay: Payer: Commercial Managed Care - PPO

## 2022-11-10 ENCOUNTER — Other Ambulatory Visit: Payer: Self-pay

## 2022-11-10 ENCOUNTER — Inpatient Hospital Stay: Payer: Commercial Managed Care - PPO | Attending: Hematology | Admitting: Hematology

## 2022-11-10 VITALS — BP 129/72 | HR 87 | Temp 98.2°F | Resp 18 | Ht 67.0 in | Wt 203.6 lb

## 2022-11-10 DIAGNOSIS — C50411 Malignant neoplasm of upper-outer quadrant of right female breast: Secondary | ICD-10-CM | POA: Insufficient documentation

## 2022-11-10 DIAGNOSIS — C7951 Secondary malignant neoplasm of bone: Secondary | ICD-10-CM

## 2022-11-10 DIAGNOSIS — Z5111 Encounter for antineoplastic chemotherapy: Secondary | ICD-10-CM | POA: Insufficient documentation

## 2022-11-10 DIAGNOSIS — T451X5A Adverse effect of antineoplastic and immunosuppressive drugs, initial encounter: Secondary | ICD-10-CM

## 2022-11-10 DIAGNOSIS — Z170 Estrogen receptor positive status [ER+]: Secondary | ICD-10-CM | POA: Diagnosis not present

## 2022-11-10 DIAGNOSIS — Z95828 Presence of other vascular implants and grafts: Secondary | ICD-10-CM

## 2022-11-10 LAB — CMP (CANCER CENTER ONLY)
ALT: 11 U/L (ref 0–44)
AST: 15 U/L (ref 15–41)
Albumin: 4.2 g/dL (ref 3.5–5.0)
Alkaline Phosphatase: 96 U/L (ref 38–126)
Anion gap: 5 (ref 5–15)
BUN: 11 mg/dL (ref 8–23)
CO2: 28 mmol/L (ref 22–32)
Calcium: 9.7 mg/dL (ref 8.9–10.3)
Chloride: 108 mmol/L (ref 98–111)
Creatinine: 0.9 mg/dL (ref 0.44–1.00)
GFR, Estimated: >60 mL/min (ref >60)
Glucose, Bld: 93 mg/dL (ref 70–99)
Potassium: 3.5 mmol/L (ref 3.5–5.1)
Sodium: 141 mmol/L (ref 135–145)
Total Bilirubin: 0.4 mg/dL (ref 0.3–1.2)
Total Protein: 7.1 g/dL (ref 6.5–8.1)

## 2022-11-10 LAB — CBC WITH DIFFERENTIAL (CANCER CENTER ONLY)
Abs Immature Granulocytes: 0.02 10*3/uL (ref 0.00–0.07)
Basophils Absolute: 0 10*3/uL (ref 0.0–0.1)
Basophils Relative: 1 %
Eosinophils Absolute: 0.1 10*3/uL (ref 0.0–0.5)
Eosinophils Relative: 2 %
HCT: 38.4 % (ref 36.0–46.0)
Hemoglobin: 13 g/dL (ref 12.0–15.0)
Immature Granulocytes: 0 %
Lymphocytes Relative: 39 %
Lymphs Abs: 2.7 10*3/uL (ref 0.7–4.0)
MCH: 30 pg (ref 26.0–34.0)
MCHC: 33.9 g/dL (ref 30.0–36.0)
MCV: 88.5 fL (ref 80.0–100.0)
Monocytes Absolute: 0.8 10*3/uL (ref 0.1–1.0)
Monocytes Relative: 11 %
Neutro Abs: 3.2 10*3/uL (ref 1.7–7.7)
Neutrophils Relative %: 47 %
Platelet Count: 294 10*3/uL (ref 150–400)
RBC: 4.34 MIL/uL (ref 3.87–5.11)
RDW: 13.9 % (ref 11.5–15.5)
WBC Count: 6.8 10*3/uL (ref 4.0–10.5)
nRBC: 0 % (ref 0.0–0.2)

## 2022-11-10 MED ORDER — SODIUM CHLORIDE 0.9 % IV SOLN: Freq: Once | INTRAVENOUS | Status: AC

## 2022-11-10 MED ORDER — LETROZOLE 2.5 MG PO TABS: 2.5000 mg | ORAL_TABLET | Freq: Every day | ORAL | 3 refills | Status: DC

## 2022-11-10 MED ORDER — DIPHENHYDRAMINE HCL 25 MG PO CAPS
50.0000 mg | ORAL_CAPSULE | Freq: Once | ORAL | Status: AC
  Administered 2022-11-10: 50 mg via ORAL

## 2022-11-10 MED ORDER — SODIUM CHLORIDE 0.9% FLUSH
3.0000 mL | Freq: Once | INTRAVENOUS | Status: AC | PRN
  Administered 2022-11-10: 3 mL

## 2022-11-10 MED ORDER — POTASSIUM CHLORIDE CRYS ER 10 MEQ PO TBCR: 10.0000 meq | EXTENDED_RELEASE_TABLET | Freq: Every day | ORAL | 1 refills | Status: DC

## 2022-11-10 MED ORDER — PERTUZ-TRASTUZ-HYALURON-ZZXF 60-60-2000 MG-MG-U/ML CHEMO ~~LOC~~ SOLN
10.0000 mL | Freq: Once | SUBCUTANEOUS | Status: AC
  Administered 2022-11-10: 10 mL via SUBCUTANEOUS
  Filled 2022-11-10: qty 10

## 2022-11-10 MED ORDER — ACETAMINOPHEN 325 MG PO TABS
650.0000 mg | ORAL_TABLET | Freq: Once | ORAL | Status: AC
  Administered 2022-11-10: 650 mg via ORAL

## 2022-11-10 MED ORDER — SODIUM CHLORIDE 0.9% FLUSH: 10.0000 mL | INTRAVENOUS | Status: AC | PRN

## 2022-11-10 MED ORDER — ZOLEDRONIC ACID 4 MG/100ML IV SOLN
4.0000 mg | Freq: Once | INTRAVENOUS | Status: AC
  Administered 2022-11-10: 4 mg via INTRAVENOUS

## 2022-11-10 NOTE — Patient Instructions (Signed)
[facility]  Discharge Instructions: Thank you for choosing [facility] to provide your oncology and hematology care.   If you have a lab appointment with the Cancer Center, please go directly to the Cancer Center and check in at the registration area.   Wear comfortable clothing and clothing appropriate for easy access to any Portacath or PICC line.   We strive to give you quality time with your provider. You may need to reschedule your appointment if you arrive late (15 or more minutes).  Arriving late affects you and other patients whose appointments are after yours.  Also, if you miss three or more appointments without notifying the office, you may be dismissed from the clinic at the provider's discretion.      For prescription refill requests, have your pharmacy contact our office and allow 72 hours for refills to be completed.    Today you received the following chemotherapy and/or immunotherapy agents phesgo      To help prevent nausea and vomiting after your treatment, we encourage you to take your nausea medication as directed.  BELOW ARE SYMPTOMS THAT SHOULD BE REPORTED IMMEDIATELY: *FEVER GREATER THAN 100.4 F (38 C) OR HIGHER *CHILLS OR SWEATING *NAUSEA AND VOMITING THAT IS NOT CONTROLLED WITH YOUR NAUSEA MEDICATION *UNUSUAL SHORTNESS OF BREATH *UNUSUAL BRUISING OR BLEEDING *URINARY PROBLEMS (pain or burning when urinating, or frequent urination) *BOWEL PROBLEMS (unusual diarrhea, constipation, pain near the anus) TENDERNESS IN MOUTH AND THROAT WITH OR WITHOUT PRESENCE OF ULCERS (sore throat, sores in mouth, or a toothache) UNUSUAL RASH, SWELLING OR PAIN  UNUSUAL VAGINAL DISCHARGE OR ITCHING   Items with * indicate a potential emergency and should be followed up as soon as possible or go to the Emergency Department if any problems should occur.  Please show the CHEMOTHERAPY ALERT CARD or IMMUNOTHERAPY ALERT CARD at check-in  to the Emergency Department and triage nurse.  Should you have questions after your visit or need to cancel or reschedule your appointment, please contact [facility]  Dept: [phone]  and follow the prompts.  Office hours are 8:00 a.m. to 4:30 p.m. Monday - Friday. Please note that voicemails left after 4:00 p.m. may not be returned until the following business day.  We are closed weekends and major holidays. You have access to a nurse at all times for urgent questions. Please call the main number to the clinic Dept: [phone] and follow the prompts.   For any non-urgent questions, you may also contact your provider using MyChart. We now offer e-Visits for anyone 74 and older to request care online for non-urgent symptoms. For details visit [email or web address].   Also download the MyChart app! Go to the app store, search "MyChart", open the app, select [place], and log in with your MyChart username and password.

## 2022-11-10 NOTE — Progress Notes (Signed)
[facility]   [phone] [phone]   Clinic Follow up Note   Patient Care Team: Luciano Cutter, PA-C as PCP - General (Physician Assistant) Almond Lint, MD as Consulting Physician (General Surgery) Malachy Mood, MD as Consulting Physician (Hematology) Dorothy Puffer, MD as Consulting Physician (Radiation Oncology) Pershing Proud, RN as Oncology Nurse Navigator Donnelly Angelica, RN as Oncology Nurse Navigator  Date of Service:  11/10/2022  CHIEF COMPLAINT: f/u of metastatic breast cancer     CURRENT THERAPY:  -Maintenance Herceptin and Perjeta (Phesgo) q3weeks starting 06/18/20, held 09/22/20-11/24/20 due to low EF -Letrozole 2.5mg  daily starting 06/18/20    ASSESSMENT:  Carolyn Stewart is a 62 y.o. female with    Malignant neoplasm of upper-outer quadrant of right breast in female, estrogen receptor positive (HCC) stage IV [phone]), with bone and possible liver mets,  ER+/PR+/HER2+, Grade II-III   --diagnosed in 11/2019 with large right breast mass measuring up to 11cm and right lymphadenopathy. 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 Left axillary node biopsy was benign.  -01/01/20 Liver biopsy was negative but 01/12/20 Bone Biopsy was positive for metastatic carcinoma from primary breast cancer. ER/PR/HER2 were all negative, possibly related to decalcification. -She received THP 01/03/20 - 2/3/2 -on maintenance HP Phesgo injection every 3 weeks -last staging scan in 08/2020, not repeated due to lack of insurance and she is clinically doing well  -repeated CT scan 11/09/2022 showed stable disease in bone and liver, no concerns for new lesion or evidence of progression.  I personally reviewed his scan images and discussed with patient -Will continue Phesgo injection every 3 weeks and letrozole, she is tolerating well      Metastasis to bone (HCC) -from breast cancer  --bone scan on 12/10/19 showed  metastatic disease in left acetabulum, L3, and right 10th rib.  --Zometa started 06/18/20, but held after second dose due to loss of insurance. -will restart since she has insurance now      Chemotherapy induced cardiomyopathy (HCC) -echo 09/22/20 showed decreased EF. She was started on losartan by Dr. Gala Romney on 10/21/20 and EF improved. -repeated echo 08/31/2022 showed EF 55-60%, stable         PLAN: -Continue Phesgo injection every 3 weeks -reviewed CT scan w/ pt -I refill Potassium  -I refill Letrozole -f/u in 9 weeks   SUMMARY OF ONCOLOGIC HISTORY: Oncology History Overview Note  Cancer Staging Malignant neoplasm of upper-outer quadrant of right breast in female, estrogen receptor positive (HCC) Staging form: Breast, AJCC 8th Edition - Clinical stage from 11/25/2019: Stage IIA (cT3, cN1, cM0, G2, ER+, PR+, HER2+) - Signed by Malachy Mood, MD on 12/03/2019    Malignant neoplasm of upper-outer quadrant of right breast in female, estrogen receptor positive (HCC)  11/13/2019 Mammogram    Diagnostic Mammogram 11/13/19  IMPRESSION: -Highly suspicious mass and calcifications in the right breast centered between 9 and 12 o'clock spanning up to 11 cm.  -Multiple masses in the right axilla. One of the masses contains calcifications, denoted as mass number 2 measuring 9 x 11 by 10 mm. Several of these masses do not appear to represent definitive lymph nodes. Others may be small but abnormal appearing lymph nodes. Taken in total, a cluster of masses in the right axilla spans up to 3.7 cm.  -There are fibrocystic changes on the left. There are also some solid-appearing masses. A solid appearing mass at 10:30, 6 cm from the nipple measures  9 x 6 x 6 mm. An adjacent solid mass at 10 o'clock, 6 cm from the nipple measures 8 x 6 by 5 mm. Another solid-appearing mass at 11 o'clock, 1 cm from the nipple measures 5 x 4 by 5 mm. Fibrocystic changes are seen at 10 o'clock, 12 o'clock, and 4 o'clock.  Another solid-appearing mass seen at 5 o'clock, 4 cm from the nipple measuring 6 x 3 x 4 mm. There is a single abnormal node in the left axilla with a cortex measuring 5 mm and restriction of the fatty hilum.   11/25/2019 Initial Biopsy   Diagnosis 1. Breast, right, needle core biopsy, upper outer - INVASIVE MAMMARY CARCINOMA - MAMMARY CARCINOMA IN SITU WITH CALCIFICATIONS AND NECROSIS - SEE COMMENT 2. Lymph node, needle/core biopsy, right axilla - INVASIVE MAMMARY CARCINOMA - SEE COMMENT 3. Lymph node, needle/core biopsy, left axilla - BENIGN LYMPH NODE - NO CARCINOMA IDENTIFIED Microscopic Comment 1. The biopsy material shows an infiltrative proliferation of cells arranged linearly and in small clusters. Based on the biopsy, the carcinoma appears Nottingham grade 2 of 3 and measures 1.2 cm in greatest linear extent. E-cadherin and prognostic markers (ER/PR/ki-67/HER2)are pending and will be reported in an addendum. Dr. Berneice Heinrich reviewed the case and agrees with the above diagnosis. These results were called to [facility] on November 26, 2019. 2. Definitive nodal tissue is not identified. This biopsy has a slightly different architecture morphology than what is seen in part 1. The carcinoma measures 0.7 cm in greatest dimension. E-cadherin and prognostic markers (ER, PR, Ki-67 and HER-2) are pending and will be reported in an addendum.   11/25/2019 Receptors her2   1. PROGNOSTIC INDICATORS Results: IMMUNOHISTOCHEMICAL AND MORPHOMETRIC ANALYSIS PERFORMED MANUALLY The tumor cells are POSITIVE for Her2 (3+). Estrogen Receptor: 90%, POSITIVE, STRONG STAINING INTENSITY Progesterone Receptor: 20%, POSITIVE, STRONG STAINING INTENSITY Proliferation Marker Ki67: 30%  2. PROGNOSTIC INDICATORS Results: IMMUNOHISTOCHEMICAL AND MORPHOMETRIC ANALYSIS PERFORMED MANUALLY The tumor cells are POSITIVE for Her2 (3+). Estrogen Receptor: 90%, POSITIVE, STRONG STAINING  INTENSITY Progesterone Receptor: 25%, POSITIVE, STRONG STAINING INTENSITY Proliferation Marker Ki67: 30%   11/25/2019 Cancer Staging   Staging form: Breast, AJCC 8th Edition - Clinical stage from 11/25/2019: Stage IIA (cT3, cN1, cM0, G2, ER+, PR+, HER2+) - Signed by Malachy Mood, MD on 12/03/2019   12/01/2019 Initial Diagnosis   Malignant neoplasm of upper-outer quadrant of right breast in female, estrogen receptor positive (HCC)   12/10/2019 Imaging   Bone Scan  IMPRESSION: Findings concerning for bony metastatic disease in the left acetabulum region, L3 vertebral body region, and anterolateral right tenth rib.   Increased uptake in several large joints likely is of arthropathic etiology.   12/11/2019 Imaging   CT CAP w contrast  IMPRESSION: Prominent glandular tissue in the central right breast with nipple retraction, likely corresponding to the patient's known right breast neoplasm.   Prominent right axillary nodes measuring up to 8 mm short axis, suspicious for nodal metastases in this clinical context.   3 mm subpleural pulmonary nodule in the right lower lobe, likely benign. However, follow-up CT chest is suggested in 3-6 months.   Multiple hepatic lesions, including a dominant 2.4 cm lesion in segment 6, which is considered indeterminate. Metastasis is not excluded. Consider MRI abdomen with/without contrast for further characterization.   11 mm sclerotic lesion along the left posterior aspect of the T7 vertebral body raises concern for isolated osseous metastasis. Correlate with priors if available. Benign vertebral hemangioma at T11.  12/12/2019 Breast MRI   IMPRESSION: 1. The biopsy proven malignancy in the right breast involves all 4 quadrants and spans up to 9 cm by MRI. There is enhancement within the skin at the level of the nipple.   2. Multiple matted masses are seen in the right axilla, either abnormal metastatic lymph nodes or a combination of masses  and metastatic lymph nodes. One of these has been previously biopsied showing invasive mammary carcinoma without definitive nodal tissue.   3.  There is a 1.5 cm Rotter's lymph node on the right.   4. There is markedly abnormal enhancement and edema in the right pectoralis major muscle spanning 6-7 cm, suspicious for metastatic disease.   5. There is diffuse nodular enhancement throughout the left breast, which may correspond with the solid-appearing masses seen on ultrasound. These all have a similar appearance on MRI.   6.  No findings of left axillary lymphadenopathy.   12/15/2019 Procedure   PAC placed    12/24/2019 Imaging   MRI abdomen  IMPRESSION: 1. Multifocal enhancing bone lesions are noted within the lumbar spine, and bony pelvis compatible with metastatic disease. 2. Four, small peripherally enhancing cystic lesions are noted within the liver worrisome for metastatic disease. 3. 2 enhancing liver lesions are also noted with signal and enhancement characteristics consistent with benign liver hemangioma. 4. Well-circumscribed, enhancing T2 and T1 hypointense structure within the right adnexal region is indeterminate. It is unclear whether not this is arising from the right ovary, broad ligament or right side of uterine fundus. Differential considerations include fibrothecoma, versus pedunculated uterine leiomyoma. Metastatic disease is considered less favored. Attention on follow-up imaging is recommended.     01/03/2020 - 05/27/2020 Chemotherapy   First line THP q3weeks starting 01/03/20-05/27/20   01/05/2020 Imaging   US Abdomen  IMPRESSION: 1. Suspect hematoma in the medial right lobe of the liver in the area of previous biopsy. Note that recent MR does not show lesion in this area. This area has ill-defined margins and measures 3.9 x 4.0 x 3.5 cm. No perihepatic fluid.   2. Probable hemangiomas elsewhere in the right lobe of the liver. Note that several smaller  lesions seen on recent MR are not appreciable by ultrasound.   3.  Study otherwise unremarkable.   01/12/2020 Pathology Results   FINAL MICROSCOPIC DIAGNOSIS:   A. BONE, SCLEROTIC LESION, LEFT ANTERIOR ILIAC SPINE, BIOPSY:  - Metastatic carcinoma, consistent with patient's clinical history of  primary breast carcinoma  - See comment   COMMENT:  Immunohistochemical stains show that the tumor cells are positive for  CK7 and GATA3; and negative for CK20, consistent with above  interpretation.   PROGNOSTIC INDICATOR RESULTS:  The tumor cells are NEGATIVE for Her2 (0); see comment  Estrogen Receptor: NEGATIVE; see comment  Progesterone Receptor: NEGATIVE; see comment    03/23/2020 Imaging   CT CAP IMPRESSION: 1. Right lower lobe perifissural nodule is slightly more conspicuous on today's study. Close attention on follow-up recommended. 2. Interval progression of segment IV liver lesion, concerning for metastatic progression. The remaining visualized liver lesions are stable to smaller in the interval. 3. Interval evolution in appearance of thoracolumbar spinal lesions and left iliac crest lesion, potentially reflecting response to therapy/healing. No definite new osseous lesions on today's study. 4. New small fluid collection right cul-de-sac. 5. Probable fibroid change in the uterus including exophytic right fundal lesion. 6. Aortic Atherosclerosis (ICD10-I70.0).   03/31/2020 Imaging   MR ABD IMPRESSION: 1. Enlarging hepatic lesion in  hepatic subsegment IV suspicious for hepatic metastasis. The it is uncertain whether the change in the short interval is due to technical factors with different modalities or true enlargement. 2. Lesion in the posterior RIGHT hepatic lobe with imaging features that are atypical for hemangioma but stability and signs of enhancement on later phase raising this question. Another more classic appearing may angioma seen inferior to this location  in hepatic subsegment VI. Attention on follow-up. 3. Heterogeneous enhancement throughout the spine particularly at L3 as exhibited on the prior study with very patchy marrow signal remains suspicious for bony metastatic lesions in this patient with known bone metastases.   04/01/2020 Imaging   Bone scan IMPRESSION: Good response to therapy with resolution of sites of uptake seen previously at the cervical spine and posterior RIGHT tenth rib as well as a diminished degree of uptake at remaining previously identified osseous metastatic foci.   04/28/2020 PET scan   IMPRESSION: 1. No hypermetabolic lesions to suggest active metastatic disease involving the liver or osseous structures. 2. Stable small low-attenuation liver lesions, likely treated disease.   06/18/2020 -  Chemotherapy   Given her excellent response, I switched to maintenance therapy with Herceptin, Perjeta (Phesgo) q3weeks and Letrozole.   06/18/2020 -  Chemotherapy   Zometa starting 06/18/20   06/18/2020 -  Anti-estrogen oral therapy   Letrozole 2.5mg  once dialy.    06/18/2020 - 06/16/2022 Chemotherapy   Patient is on Treatment Plan : BREAST Trastuzumab + Pertuzumab q21d     09/03/2020 Imaging   Bone Scan IMPRESSION: 1. Significant interval improvement compared to previous studies. Only mild uptake remains in the pelvis as above. There has been significant interval improvement over time.  CT C/A/P IMPRESSION: 1. Hypodense liver lesions are slightly decreased in size compared to prior examination, consistent with treatment response of hepatic metastatic disease. 2. Unchanged sclerotic lesion of the anterior left iliac crest. Unchanged lytic superior endplate deformity of L3, which remains equivocal for metastatic lesion versus mechanical Schmorl deformity. No CT evidence of new osseous metastatic disease. 3. No evidence of new metastatic disease in the chest, abdomen, or pelvis. 4. Coronary artery disease.    05/05/2022 Imaging    IMPRESSION: 1. Interval decreased size of the largest hepatic metastatic lesion with stable size of the additional subcentimeter bilobar hepatic lesions. Continued decrease in size of the bilobar hepatic metastases. 2. Similar appearance of the osseous metatatic disease. No new aggressive lytic or blastic lesion of bone. 3. No evidence of new or progressive metastatic disease in the chest, abdomen or pelvis.     05/05/2022 Imaging    IMPRESSION: Faint residual radiotracer uptake in the left hemipelvis.   New focus of mild abnormal radiotracer uptake in the right lateral seventh rib without discrete CT correlate on same day examination, nonspecific suggest correlation with history of interval trauma.     07/07/2022 -  Chemotherapy   Patient is on Treatment Plan : BREAST Trastuzumab + Pertuzumab q21d        INTERVAL HISTORY:  Carolyn Stewart is here for a follow up of metastatic breast cancer . She was last seen by me on 09/08/2022. She presents to the clinic alone. Pt state that she is having issue with her left thumb.She has some swelling and she been having in a brace for about 2 weeks.      All other systems were reviewed with the patient and are negative.  MEDICAL HISTORY:  Past Medical History:  Diagnosis Date   Cancer (HCC)  2021   Hypertension     SURGICAL HISTORY: Past Surgical History:  Procedure Laterality Date   left parotid tumor removal   2006   PORTACATH PLACEMENT Left 12/15/2019   Procedure: INSERTION PORT-A-CATH WITH ULTRASOUND GUIDANCE;  Surgeon: Almond Lint, MD;  Location: [facility] OR;  Service: General;  Laterality: Left;   TONSILLECTOMY      I have reviewed the social history and family history with the patient and they are unchanged from previous note.  ALLERGIES:  has No Known Allergies.  MEDICATIONS:  Current Outpatient Medications  Medication Sig Dispense Refill   acetaminophen (TYLENOL) 500 MG tablet Take 1,000 mg by  mouth every 6 (six) hours as needed for moderate pain or headache.     amLODipine (NORVASC) 10 MG tablet Take 2 tablets (20 mg total) by mouth daily. 60 tablet 11   Cholecalciferol (VITAMIN D3) 50 MCG (2000 UT) TABS Take 2,000 Units by mouth daily.      gabapentin (NEURONTIN) 100 MG capsule TAKE 2 CAPSULE BY MOUTH AT BEDTIME 60 capsule 2   letrozole (FEMARA) 2.5 MG tablet Take 1 tablet (2.5 mg total) by mouth daily. 90 tablet 3   lidocaine-prilocaine (EMLA) cream Apply 1 application topically as needed. 30 g 1   losartan (COZAAR) 100 MG tablet Take 1 tablet (100 mg total) by mouth daily. 90 tablet 3   metoprolol succinate (TOPROL-XL) 100 MG 24 hr tablet Take 1 tablet (100 mg total) by mouth daily. 30 tablet 11   omeprazole (PRILOSEC OTC) 20 MG tablet Take 20 mg by mouth daily.     potassium chloride (KLOR-CON M) 10 MEQ tablet Take 1 tablet (10 mEq total) by mouth daily. 90 tablet 1   No current facility-administered medications for this visit.   Facility-Administered Medications Ordered in Other Visits  Medication Dose Route Frequency Provider Last Rate Last Admin   sodium chloride flush (NS) 0.9 % injection 10 mL  10 mL Intravenous PRN Malachy Mood, MD        PHYSICAL EXAMINATION: ECOG PERFORMANCE STATUS: 0 - Asymptomatic  Vitals:   11/10/22 0954  BP: 129/72  Pulse: 87  Resp: 18  Temp: 98.2 F (36.8 C)  SpO2: 96%   Wt Readings from Last 3 Encounters:  11/10/22 203 lb 9.6 oz (92.4 kg)  10/20/22 201 lb (91.2 kg)  09/29/22 201 lb 14.4 oz (91.6 kg)      GENERAL:alert, no distress and comfortable SKIN: skin color normal, no rashes or significant lesions EYES: normal, Conjunctiva are pink and non-injected, sclera clear  NEURO: alert & oriented x 3 with fluent speech  LABORATORY DATA:  I have reviewed the data as listed    Latest Ref Rng & Units 11/10/2022    8:43 AM 09/08/2022    8:38 AM 07/07/2022    9:39 AM  CBC  WBC 4.0 - 10.5 K/uL 6.8  5.8  6.6   Hemoglobin 12.0 - 15.0  g/dL 16.1  09.6  04.5   Hematocrit 36.0 - 46.0 % 38.4  39.9  38.1   Platelets 150 - 400 K/uL 294  285  293         Latest Ref Rng & Units 11/10/2022    8:43 AM 09/08/2022    8:38 AM 08/18/2022    9:04 AM  CMP  Glucose 70 - 99 mg/dL 93  409  811   BUN 8 - 23 mg/dL 11  14  13    Creatinine 0.44 - 1.00 mg/dL 9.14  7.82  9.56  Sodium 135 - 145 mmol/L 141  142  143   Potassium 3.5 - 5.1 mmol/L 3.5  3.3  3.3   Chloride 98 - 111 mmol/L 108  109  108   CO2 22 - 32 mmol/L 28  27  26    Calcium 8.9 - 10.3 mg/dL 9.7  8.8  8.9   Total Protein 6.5 - 8.1 g/dL 7.1  7.0  6.8   Total Bilirubin 0.3 - 1.2 mg/dL 0.4  0.5  0.5   Alkaline Phos 38 - 126 U/L 96  103  90   AST 15 - 41 U/L 15  14  13    ALT 0 - 44 U/L 11  8  8        RADIOGRAPHIC STUDIES: I have personally reviewed the radiological images as listed and agreed with the findings in the report. CT CHEST ABDOMEN PELVIS W CONTRAST  Result Date: 11/09/2022 CLINICAL DATA:  History of metastatic breast cancer. Follow-up. * Tracking Code: BO * EXAM: CT CHEST, ABDOMEN, AND PELVIS WITH CONTRAST TECHNIQUE: Multidetector CT imaging of the chest, abdomen and pelvis was performed following the standard protocol during bolus administration of intravenous contrast. RADIATION DOSE REDUCTION: This exam was performed according to the departmental dose-optimization program which includes automated exposure control, adjustment of the mA and/or kV according to patient size and/or use of iterative reconstruction technique. CONTRAST:  OMNIPAQUE IOHEXOL 300 MG/ML  SOLN COMPARISON:  Multiple priors including most recent CT May 04, 2022 FINDINGS: CT CHEST FINDINGS Cardiovascular: Left chest Port-A-Cath with tip at the superior cavoatrial junction. Scattered aortic atherosclerosis. No central pulmonary embolus on this nondedicated study. Normal size heart. No significant pericardial effusion/thickening. Mediastinum/Nodes: No suspicious thyroid nodule. No  pathologically enlarged mediastinal, hilar or axillary lymph nodes. The esophagus is grossly unremarkable. Lungs/Pleura: Stable 2 mm right lower lobe perifissural pulmonary nodule on image 71/5. No new suspicious pulmonary nodules or masses. No pleural effusion. No pneumothorax. Musculoskeletal: T11 vertebral body hemangioma. No new suspicious lytic or blastic lesion of bone. CT ABDOMEN PELVIS FINDINGS Hepatobiliary: No new suspicious hepatic lesion. Previously indexed lesions are as follows: -largest lesion in the right lobe of the liver measures 11 x 8 mm on image 58/2 previously 14 x 9 mm. -anterior right lobe of the liver lesion in segment V/VIII measures 6 mm on image 52/2, unchanged when remeasured for consistency. Gallbladder is unremarkable.  No biliary ductal dilation. Pancreas: No pancreatic ductal dilation or evidence of acute inflammation. Spleen: No splenomegaly. Adrenals/Urinary Tract: Bilateral adrenal glands appear normal. No hydronephrosis. Kidneys demonstrate symmetric enhancement. Urinary bladder is unremarkable for degree of distension. Stomach/Bowel: No radiopaque enteric contrast material was administered. Stomach is unremarkable for degree of distension. No pathologic dilation of small or large bowel. No evidence of acute bowel inflammation. Vascular/Lymphatic: Normal caliber abdominal aorta. Smooth IVC contours. Aortic atherosclerosis. The portal, splenic and superior mesenteric veins are patent. No pathologically enlarged abdominal or pelvic lymph nodes. Reproductive: Uterine leiomyomas including a broad ligament fibroid in the right adnexa. Other: No significant abdominopelvic free fluid. Small fat containing paraumbilical hernia. Musculoskeletal: Similar appearance of the sclerotic lesion in the anterior iliac crest in the lytic L3 superior endplate lesion. Similar lucency in the posterior elements at T12 and L4 as well as in the left acetabulum. No new suspicious lytic or blastic lesion  of bone. IMPRESSION: 1. Stable examination without new or progressive metastatic disease in the chest, abdomen or pelvis. 2. Overall similar appearance of the hepatic and osseous metastatic disease. 3.  Aortic Atherosclerosis (ICD10-I70.0). Electronically Signed   By: Maudry Mayhew M.D.   On: 11/09/2022 16:35      No orders of the defined types were placed in this encounter.  All questions were answered. The patient knows to call the clinic with any problems, questions or concerns. No barriers to learning was detected. The total time spent in the appointment was 30 minutes.     Malachy Mood, MD 11/10/2022   Carolin Coy, CMA, am acting as scribe for Malachy Mood, MD.   I have reviewed the above documentation for accuracy and completeness, and I agree with the above.

## 2022-11-13 ENCOUNTER — Other Ambulatory Visit: Payer: Self-pay

## 2022-11-21 ENCOUNTER — Other Ambulatory Visit: Payer: Self-pay

## 2022-11-26 ENCOUNTER — Other Ambulatory Visit: Payer: Self-pay

## 2022-11-30 ENCOUNTER — Ambulatory Visit: Payer: Commercial Managed Care - PPO

## 2022-12-01 ENCOUNTER — Inpatient Hospital Stay: Payer: Commercial Managed Care - PPO | Attending: Hematology

## 2022-12-01 VITALS — BP 130/73 | HR 71 | Temp 98.2°F | Resp 18 | Ht 67.0 in | Wt 205.6 lb

## 2022-12-01 DIAGNOSIS — C7951 Secondary malignant neoplasm of bone: Secondary | ICD-10-CM | POA: Insufficient documentation

## 2022-12-01 DIAGNOSIS — Z170 Estrogen receptor positive status [ER+]: Secondary | ICD-10-CM | POA: Insufficient documentation

## 2022-12-01 DIAGNOSIS — C50411 Malignant neoplasm of upper-outer quadrant of right female breast: Secondary | ICD-10-CM | POA: Insufficient documentation

## 2022-12-01 DIAGNOSIS — Z5111 Encounter for antineoplastic chemotherapy: Secondary | ICD-10-CM | POA: Insufficient documentation

## 2022-12-01 MED ORDER — ACETAMINOPHEN 325 MG PO TABS
650.0000 mg | ORAL_TABLET | Freq: Once | ORAL | Status: AC
  Administered 2022-12-01: 650 mg via ORAL
  Filled 2022-12-01: qty 2

## 2022-12-01 MED ORDER — SODIUM CHLORIDE 0.9 % IV SOLN: Freq: Once | INTRAVENOUS | Status: DC

## 2022-12-01 MED ORDER — DIPHENHYDRAMINE HCL 25 MG PO CAPS
50.0000 mg | ORAL_CAPSULE | Freq: Once | ORAL | Status: AC
  Administered 2022-12-01: 50 mg via ORAL
  Filled 2022-12-01: qty 2

## 2022-12-01 MED ORDER — PERTUZ-TRASTUZ-HYALURON-ZZXF 60-60-2000 MG-MG-U/ML CHEMO ~~LOC~~ SOLN
10.0000 mL | Freq: Once | SUBCUTANEOUS | Status: AC
  Administered 2022-12-01: 10 mL via SUBCUTANEOUS
  Filled 2022-12-01: qty 10

## 2022-12-01 NOTE — Patient Instructions (Signed)
[facility]  Discharge Instructions: Thank you for choosing [facility] to provide your oncology and hematology care.   If you have a lab appointment with the Cancer Center, please go directly to the Cancer Center and check in at the registration area.   Wear comfortable clothing and clothing appropriate for easy access to any Portacath or PICC line.   We strive to give you quality time with your provider. You may need to reschedule your appointment if you arrive late (15 or more minutes).  Arriving late affects you and other patients whose appointments are after yours.  Also, if you miss three or more appointments without notifying the office, you may be dismissed from the clinic at the provider's discretion.      For prescription refill requests, have your pharmacy contact our office and allow 72 hours for refills to be completed.    Today you received the following chemotherapy and/or immunotherapy agents P[place]      To help prevent nausea and vomiting after your treatment, we encourage you to take your nausea medication as directed.  BELOW ARE SYMPTOMS THAT SHOULD BE REPORTED IMMEDIATELY: *FEVER GREATER THAN 100.4 F (38 C) OR HIGHER *CHILLS OR SWEATING *NAUSEA AND VOMITING THAT IS NOT CONTROLLED WITH YOUR NAUSEA MEDICATION *UNUSUAL SHORTNESS OF BREATH *UNUSUAL BRUISING OR BLEEDING *URINARY PROBLEMS (pain or burning when urinating, or frequent urination) *BOWEL PROBLEMS (unusual diarrhea, constipation, pain near the anus) TENDERNESS IN MOUTH AND THROAT WITH OR WITHOUT PRESENCE OF ULCERS (sore throat, sores in mouth, or a toothache) UNUSUAL RASH, SWELLING OR PAIN  UNUSUAL VAGINAL DISCHARGE OR ITCHING   Items with * indicate a potential emergency and should be followed up as soon as possible or go to the Emergency Department if any problems should occur.  Please show the CHEMOTHERAPY ALERT CARD or IMMUNOTHERAPY ALERT CARD at check-in to  the Emergency Department and triage nurse.  Should you have questions after your visit or need to cancel or reschedule your appointment, please contact [facility]  Dept: [phone]  and follow the prompts.  Office hours are 8:00 a.m. to 4:30 p.m. Monday - Friday. Please note that voicemails left after 4:00 p.m. may not be returned until the following business day.  We are closed weekends and major holidays. You have access to a nurse at all times for urgent questions. Please call the main number to the clinic Dept: [phone] and follow the prompts.   For any non-urgent questions, you may also contact your provider using MyChart. We now offer e-Visits for anyone 10 and older to request care online for non-urgent symptoms. For details visit [email or web address].   Also download the MyChart app! Go to the app store, search "MyChart", open the app, select [place], and log in with your MyChart username and password.

## 2022-12-21 ENCOUNTER — Ambulatory Visit: Payer: Commercial Managed Care - PPO

## 2022-12-22 ENCOUNTER — Inpatient Hospital Stay: Payer: Commercial Managed Care - PPO

## 2022-12-22 VITALS — BP 122/73 | HR 82 | Temp 98.1°F | Resp 18 | Ht 67.0 in | Wt 204.7 lb

## 2022-12-22 DIAGNOSIS — C50411 Malignant neoplasm of upper-outer quadrant of right female breast: Secondary | ICD-10-CM

## 2022-12-22 DIAGNOSIS — Z5111 Encounter for antineoplastic chemotherapy: Secondary | ICD-10-CM | POA: Diagnosis not present

## 2022-12-22 MED ORDER — DIPHENHYDRAMINE HCL 25 MG PO CAPS
50.0000 mg | ORAL_CAPSULE | Freq: Once | ORAL | Status: AC
  Administered 2022-12-22: 50 mg via ORAL
  Filled 2022-12-22: qty 2

## 2022-12-22 MED ORDER — ACETAMINOPHEN 325 MG PO TABS
650.0000 mg | ORAL_TABLET | Freq: Once | ORAL | Status: AC
  Administered 2022-12-22: 650 mg via ORAL
  Filled 2022-12-22: qty 2

## 2022-12-22 MED ORDER — PERTUZ-TRASTUZ-HYALURON-ZZXF 60-60-2000 MG-MG-U/ML CHEMO ~~LOC~~ SOLN
10.0000 mL | Freq: Once | SUBCUTANEOUS | Status: AC
  Administered 2022-12-22: 10 mL via SUBCUTANEOUS
  Filled 2022-12-22: qty 10

## 2022-12-22 NOTE — Patient Instructions (Signed)
 [facility]  Discharge Instructions: Thank you for choosing [facility] to provide your oncology and hematology care.   If you have a lab appointment with the Cancer Center, please go directly to the Cancer Center and check in at the registration area.   Wear comfortable clothing and clothing appropriate for easy access to any Portacath or PICC line.   We strive to give you quality time with your provider. You may need to reschedule your appointment if you arrive late (15 or more minutes).  Arriving late affects you and other patients whose appointments are after yours.  Also, if you miss three or more appointments without notifying the office, you may be dismissed from the clinic at the provider's discretion.      For prescription refill requests, have your pharmacy contact our office and allow 72 hours for refills to be completed.    Today you received the following chemotherapy and/or immunotherapy agents [place]      To help prevent nausea and vomiting after your treatment, we encourage you to take your nausea medication as directed.  BELOW ARE SYMPTOMS THAT SHOULD BE REPORTED IMMEDIATELY: *FEVER GREATER THAN 100.4 F (38 C) OR HIGHER *CHILLS OR SWEATING *NAUSEA AND VOMITING THAT IS NOT CONTROLLED WITH YOUR NAUSEA MEDICATION *UNUSUAL SHORTNESS OF BREATH *UNUSUAL BRUISING OR BLEEDING *URINARY PROBLEMS (pain or burning when urinating, or frequent urination) *BOWEL PROBLEMS (unusual diarrhea, constipation, pain near the anus) TENDERNESS IN MOUTH AND THROAT WITH OR WITHOUT PRESENCE OF ULCERS (sore throat, sores in mouth, or a toothache) UNUSUAL RASH, SWELLING OR PAIN  UNUSUAL VAGINAL DISCHARGE OR ITCHING   Items with * indicate a potential emergency and should be followed up as soon as possible or go to the Emergency Department if any problems should occur.  Please show the CHEMOTHERAPY ALERT CARD or IMMUNOTHERAPY ALERT CARD at check-in to  the Emergency Department and triage nurse.  Should you have questions after your visit or need to cancel or reschedule your appointment, please contact [facility]  Dept: [phone]  and follow the prompts.  Office hours are 8:00 a.m. to 4:30 p.m. Monday - Friday. Please note that voicemails left after 4:00 p.m. may not be returned until the following business day.  We are closed weekends and major holidays. You have access to a nurse at all times for urgent questions. Please call the main number to the clinic Dept: [phone] and follow the prompts.   For any non-urgent questions, you may also contact your provider using MyChart. We now offer e-Visits for anyone 10 and older to request care online for non-urgent symptoms. For details visit [email or web address].   Also download the MyChart app! Go to the app store, search "MyChart", open the app, select [place], and log in with your MyChart username and password.

## 2022-12-24 ENCOUNTER — Other Ambulatory Visit: Payer: Self-pay

## 2023-01-03 ENCOUNTER — Other Ambulatory Visit: Payer: Self-pay

## 2023-01-11 ENCOUNTER — Ambulatory Visit: Payer: Commercial Managed Care - PPO | Admitting: Hematology

## 2023-01-11 ENCOUNTER — Other Ambulatory Visit: Payer: Commercial Managed Care - PPO

## 2023-01-11 ENCOUNTER — Ambulatory Visit: Payer: Commercial Managed Care - PPO

## 2023-01-11 ENCOUNTER — Other Ambulatory Visit: Payer: Self-pay

## 2023-01-12 ENCOUNTER — Inpatient Hospital Stay: Payer: Commercial Managed Care - PPO | Attending: Hematology

## 2023-01-12 ENCOUNTER — Inpatient Hospital Stay: Payer: Commercial Managed Care - PPO | Attending: Hematology | Admitting: Physician Assistant

## 2023-01-12 ENCOUNTER — Inpatient Hospital Stay: Payer: Commercial Managed Care - PPO

## 2023-01-12 VITALS — BP 121/82 | HR 81 | Resp 16

## 2023-01-12 VITALS — BP 138/81 | HR 92 | Temp 97.7°F | Resp 16 | Ht 67.0 in | Wt 205.4 lb

## 2023-01-12 DIAGNOSIS — C7951 Secondary malignant neoplasm of bone: Secondary | ICD-10-CM | POA: Insufficient documentation

## 2023-01-12 DIAGNOSIS — I427 Cardiomyopathy due to drug and external agent: Secondary | ICD-10-CM

## 2023-01-12 DIAGNOSIS — Z79811 Long term (current) use of aromatase inhibitors: Secondary | ICD-10-CM | POA: Diagnosis not present

## 2023-01-12 DIAGNOSIS — C50411 Malignant neoplasm of upper-outer quadrant of right female breast: Secondary | ICD-10-CM | POA: Diagnosis not present

## 2023-01-12 DIAGNOSIS — Z170 Estrogen receptor positive status [ER+]: Secondary | ICD-10-CM | POA: Diagnosis not present

## 2023-01-12 DIAGNOSIS — Z5111 Encounter for antineoplastic chemotherapy: Secondary | ICD-10-CM | POA: Diagnosis present

## 2023-01-12 LAB — CBC WITH DIFFERENTIAL (CANCER CENTER ONLY)
Abs Immature Granulocytes: 0.02 10*3/uL (ref 0.00–0.07)
Basophils Absolute: 0 10*3/uL (ref 0.0–0.1)
Basophils Relative: 0 %
Eosinophils Absolute: 0.2 10*3/uL (ref 0.0–0.5)
Eosinophils Relative: 3 %
HCT: 39.7 % (ref 36.0–46.0)
Hemoglobin: 12.8 g/dL (ref 12.0–15.0)
Immature Granulocytes: 0 %
Lymphocytes Relative: 38 %
Lymphs Abs: 2.6 10*3/uL (ref 0.7–4.0)
MCH: 29.2 pg (ref 26.0–34.0)
MCHC: 32.2 g/dL (ref 30.0–36.0)
MCV: 90.4 fL (ref 80.0–100.0)
Monocytes Absolute: 0.7 10*3/uL (ref 0.1–1.0)
Monocytes Relative: 10 %
Neutro Abs: 3.4 10*3/uL (ref 1.7–7.7)
Neutrophils Relative %: 49 %
Platelet Count: 368 10*3/uL (ref 150–400)
RBC: 4.39 MIL/uL (ref 3.87–5.11)
RDW: 13.7 % (ref 11.5–15.5)
WBC Count: 6.9 10*3/uL (ref 4.0–10.5)
nRBC: 0 % (ref 0.0–0.2)

## 2023-01-12 LAB — CMP (CANCER CENTER ONLY)
ALT: 13 U/L (ref 0–44)
AST: 15 U/L (ref 15–41)
Albumin: 4.2 g/dL (ref 3.5–5.0)
Alkaline Phosphatase: 82 U/L (ref 38–126)
Anion gap: 6 (ref 5–15)
BUN: 12 mg/dL (ref 8–23)
CO2: 27 mmol/L (ref 22–32)
Calcium: 9.3 mg/dL (ref 8.9–10.3)
Chloride: 109 mmol/L (ref 98–111)
Creatinine: 0.81 mg/dL (ref 0.44–1.00)
GFR, Estimated: >60 mL/min (ref >60)
Glucose, Bld: 113 mg/dL — ABNORMAL HIGH (ref 70–99)
Potassium: 3.5 mmol/L (ref 3.5–5.1)
Sodium: 142 mmol/L (ref 135–145)
Total Bilirubin: 0.5 mg/dL (ref 0.3–1.2)
Total Protein: 7.2 g/dL (ref 6.5–8.1)

## 2023-01-12 MED ORDER — ACETAMINOPHEN 325 MG PO TABS
650.0000 mg | ORAL_TABLET | Freq: Once | ORAL | Status: AC
  Administered 2023-01-12: 650 mg via ORAL
  Filled 2023-01-12: qty 2

## 2023-01-12 MED ORDER — DIPHENHYDRAMINE HCL 25 MG PO CAPS
50.0000 mg | ORAL_CAPSULE | Freq: Once | ORAL | Status: AC
  Administered 2023-01-12: 50 mg via ORAL
  Filled 2023-01-12: qty 2

## 2023-01-12 MED ORDER — HEPARIN SOD (PORK) LOCK FLUSH 100 UNIT/ML IV SOLN
500.0000 [IU] | Freq: Once | INTRAVENOUS | Status: AC
  Administered 2023-01-12: 500 [IU]

## 2023-01-12 MED ORDER — PERTUZ-TRASTUZ-HYALURON-ZZXF 60-60-2000 MG-MG-U/ML CHEMO ~~LOC~~ SOLN
10.0000 mL | Freq: Once | SUBCUTANEOUS | Status: AC
  Administered 2023-01-12: 10 mL via SUBCUTANEOUS
  Filled 2023-01-12: qty 10

## 2023-01-12 MED ORDER — SODIUM CHLORIDE 0.9% FLUSH
10.0000 mL | Freq: Once | INTRAVENOUS | Status: AC
  Administered 2023-01-12: 10 mL

## 2023-01-12 NOTE — Progress Notes (Signed)
[facility]   Telephone:([phone]) [phone] Fax:([phone]) [phone]   Clinic Follow up Note   Patient Care Team: Luciano Cutter, PA-C as PCP - General (Physician Assistant) Almond Lint, MD as Consulting Physician (General Surgery) Malachy Mood, MD as Consulting Physician (Hematology) Dorothy Puffer, MD as Consulting Physician (Radiation Oncology) Pershing Proud, RN as Oncology Nurse Navigator Donnelly Angelica, RN as Oncology Nurse Navigator  Date of Service:  01/12/2023  CHIEF COMPLAINT: f/u of metastatic breast cancer    CURRENT THERAPY:  -Maintenance Herceptin and Perjeta (Phesgo) q3weeks starting 06/18/20, held 09/22/20-11/24/20 due to low EF -Letrozole 2.5mg  daily starting 06/18/20   SUMMARY OF ONCOLOGIC HISTORY: Oncology History Overview Note  Cancer Staging Malignant neoplasm of upper-outer quadrant of right breast in female, estrogen receptor positive (HCC) Staging form: Breast, AJCC 8th Edition - Clinical stage from 11/25/2019: Stage IIA (cT3, cN1, cM0, G2, ER+, PR+, HER2+) - Signed by Malachy Mood, MD on 12/03/2019    Malignant neoplasm of upper-outer quadrant of right breast in female, estrogen receptor positive (HCC)  11/13/2019 Mammogram    Diagnostic Mammogram 11/13/19  IMPRESSION: -Highly suspicious mass and calcifications in the right breast centered between 9 and 12 o'clock spanning up to 11 cm.  -Multiple masses in the right axilla. One of the masses contains calcifications, denoted as mass number 2 measuring 9 x 11 by 10 mm. Several of these masses do not appear to represent definitive lymph nodes. Others may be small but abnormal appearing lymph nodes. Taken in total, a cluster of masses in the right axilla spans up to 3.7 cm.  -There are fibrocystic changes on the left. There are also some solid-appearing masses. A solid appearing mass at 10:30, 6 cm from the nipple measures 9 x 6 x 6 mm. An adjacent solid mass at 10 o'clock, 6 cm from the nipple measures 8 x 6 by  5 mm. Another solid-appearing mass at 11 o'clock, 1 cm from the nipple measures 5 x 4 by 5 mm. Fibrocystic changes are seen at 10 o'clock, 12 o'clock, and 4 o'clock. Another solid-appearing mass seen at 5 o'clock, 4 cm from the nipple measuring 6 x 3 x 4 mm. There is a single abnormal node in the left axilla with a cortex measuring 5 mm and restriction of the fatty hilum.   11/25/2019 Initial Biopsy   Diagnosis 1. Breast, right, needle core biopsy, upper outer - INVASIVE MAMMARY CARCINOMA - MAMMARY CARCINOMA IN SITU WITH CALCIFICATIONS AND NECROSIS - SEE COMMENT 2. Lymph node, needle/core biopsy, right axilla - INVASIVE MAMMARY CARCINOMA - SEE COMMENT 3. Lymph node, needle/core biopsy, left axilla - BENIGN LYMPH NODE - NO CARCINOMA IDENTIFIED Microscopic Comment 1. The biopsy material shows an infiltrative proliferation of cells arranged linearly and in small clusters. Based on the biopsy, the carcinoma appears Nottingham grade 2 of 3 and measures 1.2 cm in greatest linear extent. E-cadherin and prognostic markers (ER/PR/ki-67/HER2)are pending and will be reported in an addendum. Dr. Berneice Heinrich reviewed the case and agrees with the above diagnosis. These results were called to [facility] on November 26, 2019. 2. Definitive nodal tissue is not identified. This biopsy has a slightly different architecture morphology than what is seen in part 1. The carcinoma measures 0.7 cm in greatest dimension. E-cadherin and prognostic markers (ER, PR, Ki-67 and HER-2) are pending and will be reported in an addendum.   11/25/2019 Receptors her2   1. PROGNOSTIC INDICATORS Results: IMMUNOHISTOCHEMICAL AND MORPHOMETRIC ANALYSIS PERFORMED MANUALLY The tumor  cells are POSITIVE for Her2 (3+). Estrogen Receptor: 90%, POSITIVE, STRONG STAINING INTENSITY Progesterone Receptor: 20%, POSITIVE, STRONG STAINING INTENSITY Proliferation Marker Ki67: 30%  2. PROGNOSTIC  INDICATORS Results: IMMUNOHISTOCHEMICAL AND MORPHOMETRIC ANALYSIS PERFORMED MANUALLY The tumor cells are POSITIVE for Her2 (3+). Estrogen Receptor: 90%, POSITIVE, STRONG STAINING INTENSITY Progesterone Receptor: 25%, POSITIVE, STRONG STAINING INTENSITY Proliferation Marker Ki67: 30%   11/25/2019 Cancer Staging   Staging form: Breast, AJCC 8th Edition - Clinical stage from 11/25/2019: Stage IIA (cT3, cN1, cM0, G2, ER+, PR+, HER2+) - Signed by Malachy Mood, MD on 12/03/2019   12/01/2019 Initial Diagnosis   Malignant neoplasm of upper-outer quadrant of right breast in female, estrogen receptor positive (HCC)   12/10/2019 Imaging   Bone Scan  IMPRESSION: Findings concerning for bony metastatic disease in the left acetabulum region, L3 vertebral body region, and anterolateral right tenth rib.   Increased uptake in several large joints likely is of arthropathic etiology.   12/11/2019 Imaging   CT CAP w contrast  IMPRESSION: Prominent glandular tissue in the central right breast with nipple retraction, likely corresponding to the patient's known right breast neoplasm.   Prominent right axillary nodes measuring up to 8 mm short axis, suspicious for nodal metastases in this clinical context.   3 mm subpleural pulmonary nodule in the right lower lobe, likely benign. However, follow-up CT chest is suggested in 3-6 months.   Multiple hepatic lesions, including a dominant 2.4 cm lesion in segment 6, which is considered indeterminate. Metastasis is not excluded. Consider MRI abdomen with/without contrast for further characterization.   11 mm sclerotic lesion along the left posterior aspect of the T7 vertebral body raises concern for isolated osseous metastasis. Correlate with priors if available. Benign vertebral hemangioma at T11.   12/12/2019 Breast MRI   IMPRESSION: 1. The biopsy proven malignancy in the right breast involves all 4 quadrants and spans up to 9 cm by MRI. There is  enhancement within the skin at the level of the nipple.   2. Multiple matted masses are seen in the right axilla, either abnormal metastatic lymph nodes or a combination of masses and metastatic lymph nodes. One of these has been previously biopsied showing invasive mammary carcinoma without definitive nodal tissue.   3.  There is a 1.5 cm Rotter's lymph node on the right.   4. There is markedly abnormal enhancement and edema in the right pectoralis major muscle spanning 6-7 cm, suspicious for metastatic disease.   5. There is diffuse nodular enhancement throughout the left breast, which may correspond with the solid-appearing masses seen on ultrasound. These all have a similar appearance on MRI.   6.  No findings of left axillary lymphadenopathy.   12/15/2019 Procedure   PAC placed    12/24/2019 Imaging   MRI abdomen  IMPRESSION: 1. Multifocal enhancing bone lesions are noted within the lumbar spine, and bony pelvis compatible with metastatic disease. 2. Four, small peripherally enhancing cystic lesions are noted within the liver worrisome for metastatic disease. 3. 2 enhancing liver lesions are also noted with signal and enhancement characteristics consistent with benign liver hemangioma. 4. Well-circumscribed, enhancing T2 and T1 hypointense structure within the right adnexal region is indeterminate. It is unclear whether not this is arising from the right ovary, broad ligament or right side of uterine fundus. Differential considerations include fibrothecoma, versus pedunculated uterine leiomyoma. Metastatic disease is considered less favored. Attention on follow-up imaging is recommended.     01/03/2020 - 05/27/2020 Chemotherapy   First line THP q3weeks  starting 01/03/20-05/27/20   01/05/2020 Imaging   US Abdomen  IMPRESSION: 1. Suspect hematoma in the medial right lobe of the liver in the area of previous biopsy. Note that recent MR does not show lesion in this area. This  area has ill-defined margins and measures 3.9 x 4.0 x 3.5 cm. No perihepatic fluid.   2. Probable hemangiomas elsewhere in the right lobe of the liver. Note that several smaller lesions seen on recent MR are not appreciable by ultrasound.   3.  Study otherwise unremarkable.   01/12/2020 Pathology Results   FINAL MICROSCOPIC DIAGNOSIS:   A. BONE, SCLEROTIC LESION, LEFT ANTERIOR ILIAC SPINE, BIOPSY:  - Metastatic carcinoma, consistent with patient's clinical history of  primary breast carcinoma  - See comment   COMMENT:  Immunohistochemical stains show that the tumor cells are positive for  CK7 and GATA3; and negative for CK20, consistent with above  interpretation.   PROGNOSTIC INDICATOR RESULTS:  The tumor cells are NEGATIVE for Her2 (0); see comment  Estrogen Receptor: NEGATIVE; see comment  Progesterone Receptor: NEGATIVE; see comment    03/23/2020 Imaging   CT CAP IMPRESSION: 1. Right lower lobe perifissural nodule is slightly more conspicuous on today's study. Close attention on follow-up recommended. 2. Interval progression of segment IV liver lesion, concerning for metastatic progression. The remaining visualized liver lesions are stable to smaller in the interval. 3. Interval evolution in appearance of thoracolumbar spinal lesions and left iliac crest lesion, potentially reflecting response to therapy/healing. No definite new osseous lesions on today's study. 4. New small fluid collection right cul-de-sac. 5. Probable fibroid change in the uterus including exophytic right fundal lesion. 6. Aortic Atherosclerosis (ICD10-I70.0).   03/31/2020 Imaging   MR ABD IMPRESSION: 1. Enlarging hepatic lesion in hepatic subsegment IV suspicious for hepatic metastasis. The it is uncertain whether the change in the short interval is due to technical factors with different modalities or true enlargement. 2. Lesion in the posterior RIGHT hepatic lobe with imaging features that are  atypical for hemangioma but stability and signs of enhancement on later phase raising this question. Another more classic appearing may angioma seen inferior to this location in hepatic subsegment VI. Attention on follow-up. 3. Heterogeneous enhancement throughout the spine particularly at L3 as exhibited on the prior study with very patchy marrow signal remains suspicious for bony metastatic lesions in this patient with known bone metastases.   04/01/2020 Imaging   Bone scan IMPRESSION: Good response to therapy with resolution of sites of uptake seen previously at the cervical spine and posterior RIGHT tenth rib as well as a diminished degree of uptake at remaining previously identified osseous metastatic foci.   04/28/2020 PET scan   IMPRESSION: 1. No hypermetabolic lesions to suggest active metastatic disease involving the liver or osseous structures. 2. Stable small low-attenuation liver lesions, likely treated disease.   06/18/2020 -  Chemotherapy   Given her excellent response, I switched to maintenance therapy with Herceptin, Perjeta (Phesgo) q3weeks and Letrozole.   06/18/2020 -  Chemotherapy   Zometa starting 06/18/20   06/18/2020 -  Anti-estrogen oral therapy   Letrozole 2.5mg  once dialy.    06/18/2020 - 06/16/2022 Chemotherapy   Patient is on Treatment Plan : BREAST Trastuzumab + Pertuzumab q21d     09/03/2020 Imaging   Bone Scan IMPRESSION: 1. Significant interval improvement compared to previous studies. Only mild uptake remains in the pelvis as above. There has been significant interval improvement over time.  CT C/A/P IMPRESSION: 1. Hypodense liver lesions  are slightly decreased in size compared to prior examination, consistent with treatment response of hepatic metastatic disease. 2. Unchanged sclerotic lesion of the anterior left iliac crest. Unchanged lytic superior endplate deformity of L3, which remains equivocal for metastatic lesion versus mechanical  Schmorl deformity. No CT evidence of new osseous metastatic disease. 3. No evidence of new metastatic disease in the chest, abdomen, or pelvis. 4. Coronary artery disease.   05/05/2022 Imaging    IMPRESSION: 1. Interval decreased size of the largest hepatic metastatic lesion with stable size of the additional subcentimeter bilobar hepatic lesions. Continued decrease in size of the bilobar hepatic metastases. 2. Similar appearance of the osseous metatatic disease. No new aggressive lytic or blastic lesion of bone. 3. No evidence of new or progressive metastatic disease in the chest, abdomen or pelvis.     05/05/2022 Imaging    IMPRESSION: Faint residual radiotracer uptake in the left hemipelvis.   New focus of mild abnormal radiotracer uptake in the right lateral seventh rib without discrete CT correlate on same day examination, nonspecific suggest correlation with history of interval trauma.     07/07/2022 -  Chemotherapy   Patient is on Treatment Plan : BREAST Trastuzumab + Pertuzumab q21d        INTERVAL HISTORY:  Carolyn Stewart is here for a follow up of metastatic breast cancer . She was last seen by me on 11/10/2022. She presents to the clinic alone.   Ms. Parron reports that she does get tired more easily but continues to complete all her daily activities on her own.  Her appetite and energy are stable.  She denies any nausea, vomiting or abdominal pain.  Her bowel habits are unchanged without recurrent episodes of diarrhea or constipation.  She denies easy bruising or signs of active bleeding.  Fevers, chills, sweats, shortness of breath, chest pain or cough.  She has no other complaints.  All other systems were reviewed with the patient and are negative.  MEDICAL HISTORY:  Past Medical History:  Diagnosis Date   Cancer (HCC) 2021   Hypertension     SURGICAL HISTORY: Past Surgical History:  Procedure Laterality Date   left parotid tumor removal   2006    PORTACATH PLACEMENT Left 12/15/2019   Procedure: INSERTION PORT-A-CATH WITH ULTRASOUND GUIDANCE;  Surgeon: Almond Lint, MD;  Location: [facility] OR;  Service: General;  Laterality: Left;   TONSILLECTOMY      I have reviewed the social history and family history with the patient and they are unchanged from previous note.  ALLERGIES:  has No Known Allergies.  MEDICATIONS:  Current Outpatient Medications  Medication Sig Dispense Refill   acetaminophen (TYLENOL) 500 MG tablet Take 1,000 mg by mouth every 6 (six) hours as needed for moderate pain or headache.     amLODipine (NORVASC) 10 MG tablet Take 2 tablets (20 mg total) by mouth daily. 60 tablet 11   Cholecalciferol (VITAMIN D3) 50 MCG (2000 UT) TABS Take 2,000 Units by mouth daily.      gabapentin (NEURONTIN) 100 MG capsule TAKE 2 CAPSULE BY MOUTH AT BEDTIME 60 capsule 2   letrozole (FEMARA) 2.5 MG tablet Take 1 tablet (2.5 mg total) by mouth daily. 90 tablet 3   lidocaine-prilocaine (EMLA) cream Apply 1 application topically as needed. 30 g 1   losartan (COZAAR) 100 MG tablet Take 1 tablet (100 mg total) by mouth daily. 90 tablet 3   metoprolol succinate (TOPROL-XL) 100 MG 24 hr tablet Take 1 tablet (100 mg total)  by mouth daily. 30 tablet 11   omeprazole (PRILOSEC OTC) 20 MG tablet Take 20 mg by mouth daily.     potassium chloride (KLOR-CON M) 10 MEQ tablet Take 1 tablet (10 mEq total) by mouth daily. 90 tablet 1   No current facility-administered medications for this visit.   Facility-Administered Medications Ordered in Other Visits  Medication Dose Route Frequency Provider Last Rate Last Admin   sodium chloride flush (NS) 0.9 % injection 10 mL  10 mL Intravenous PRN Malachy Mood, MD        PHYSICAL EXAMINATION: ECOG PERFORMANCE STATUS: 0 - Asymptomatic  Vitals:   01/12/23 0835  BP: 138/81  Pulse: 92  Resp: 16  Temp: 97.7 F (36.5 C)  SpO2: 98%   Wt Readings from Last 3 Encounters:  01/12/23 205 lb 6.4 oz (93.2 kg)  12/22/22  204 lb 11.2 oz (92.9 kg)  12/01/22 205 lb 9 oz (93.2 kg)      GENERAL:alert, no distress and comfortable SKIN: skin color normal, no rashes or significant lesions EYES: normal, Conjunctiva are pink and non-injected, sclera clear  NEURO: alert & oriented x 3 with fluent speech  LABORATORY DATA:  I have reviewed the data as listed    Latest Ref Rng & Units 11/10/2022    8:43 AM 09/08/2022    8:38 AM 07/07/2022    9:39 AM  CBC  WBC 4.0 - 10.5 K/uL 6.8  5.8  6.6   Hemoglobin 12.0 - 15.0 g/dL 96.0  45.4  09.8   Hematocrit 36.0 - 46.0 % 38.4  39.9  38.1   Platelets 150 - 400 K/uL 294  285  293         Latest Ref Rng & Units 11/10/2022    8:43 AM 09/08/2022    8:38 AM 08/18/2022    9:04 AM  CMP  Glucose 70 - 99 mg/dL 93  119  147   BUN 8 - 23 mg/dL 11  14  13    Creatinine 0.44 - 1.00 mg/dL 8.29  5.62  1.30   Sodium 135 - 145 mmol/L 141  142  143   Potassium 3.5 - 5.1 mmol/L 3.5  3.3  3.3   Chloride 98 - 111 mmol/L 108  109  108   CO2 22 - 32 mmol/L 28  27  26    Calcium 8.9 - 10.3 mg/dL 9.7  8.8  8.9   Total Protein 6.5 - 8.1 g/dL 7.1  7.0  6.8   Total Bilirubin 0.3 - 1.2 mg/dL 0.4  0.5  0.5   Alkaline Phos 38 - 126 U/L 96  103  90   AST 15 - 41 U/L 15  14  13    ALT 0 - 44 U/L 11  8  8        RADIOGRAPHIC STUDIES: I have personally reviewed the radiological images as listed and agreed with the findings in the report. No results found.     ASSESSMENT:  Carolyn Stewart is a 62 y.o. female who returns for continued management of breast cancer.    Malignant neoplasm of upper-outer quadrant of right breast in female, estrogen receptor positive (HCC) stage IV Q([id number]), with bone and possible liver mets,  ER+/PR+/HER2+, Grade II-III   --diagnosed in 11/2019 with large right breast mass measuring up to 11cm and right lymphadenopathy. 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 Left axillary node biopsy was benign.  -01/01/20 Liver  biopsy was negative  but 01/12/20 Bone Biopsy was positive for metastatic carcinoma from primary breast cancer. ER/PR/HER2 were all negative, possibly related to decalcification. -She received THP 01/03/20 - 2/3/2 -on maintenance HP Phesgo injection every 3 weeks -repeated CT scan 11/09/2022 showed stable disease in bone and liver, no concerns for new lesion or evidence of progression.    Metastasis to bone (HCC) -from breast cancer  --bone scan on 12/10/19 showed metastatic disease in left acetabulum, L3, and right 10th rib.  --Zometa started 06/18/20, but held after second dose due to loss of insurance. -will restart since she has insurance now      Chemotherapy induced cardiomyopathy (HCC) -echo 09/22/20 showed decreased EF. She was started on losartan by Dr. Gala Romney on 10/21/20 and EF improved. -Most recent echo from 08/31/2022 showed EF 55-60%, stable  -Per cardiology, repeat echos every 6 months, next one will be due around [facility] 2024.    PLAN: -Due for Cycle 10, Day 1 of Phesgo injection.  -Labs from today were reviewed and adequate for treatment. No cytopenias, normal LFTs and creatinine. -Proceed with treatment today without any dose modifications.  -Continue with Phesgo injection every 3 weeks -RTC for labs and toxicity check in 9 weeks.   No orders of the defined types were placed in this encounter.  All questions were answered. The patient knows to call the clinic with any problems, questions or concerns. No barriers to learning was detected.  I have spent a total of 30 minutes minutes of face-to-face and non-face-to-face time, preparing to see the patient,  performing a medically appropriate examination, counseling and educating the patient, documenting clinical information in the electronic health record, independently interpreting results and communicating results to the patient, and care coordination.   Georga Kaufmann PA-C Dept of Hematology and Oncology [facility] Phone: [phone]

## 2023-01-12 NOTE — Progress Notes (Signed)
Pt observed for 15 minutes post Phesgo injection. Pt tolerated Tx well w/out incident. VSS at discharge.  Ambulatory to lobby.

## 2023-01-12 NOTE — Patient Instructions (Signed)
[place] [facility]  Discharge Instructions: Thank you for choosing [facility] to provide your oncology and hematology care.   If you have a lab appointment with the Cancer Center, please go directly to the Cancer Center and check in at the registration area.   Wear comfortable clothing and clothing appropriate for easy access to any Portacath or PICC line.   We strive to give you quality time with your provider. You may need to reschedule your appointment if you arrive late (15 or more minutes).  Arriving late affects you and other patients whose appointments are after yours.  Also, if you miss three or more appointments without notifying the office, you may be dismissed from the clinic at the provider's discretion.      For prescription refill requests, have your pharmacy contact our office and allow 72 hours for refills to be completed.    Today you received the following chemotherapy and/or immunotherapy agents: Phesgo.      To help prevent nausea and vomiting after your treatment, we encourage you to take your nausea medication as directed.  BELOW ARE SYMPTOMS THAT SHOULD BE REPORTED IMMEDIATELY: *FEVER GREATER THAN 100.4 F (38 C) OR HIGHER *CHILLS OR SWEATING *NAUSEA AND VOMITING THAT IS NOT CONTROLLED WITH YOUR NAUSEA MEDICATION *UNUSUAL SHORTNESS OF BREATH *UNUSUAL BRUISING OR BLEEDING *URINARY PROBLEMS (pain or burning when urinating, or frequent urination) *BOWEL PROBLEMS (unusual diarrhea, constipation, pain near the anus) TENDERNESS IN MOUTH AND THROAT WITH OR WITHOUT PRESENCE OF ULCERS (sore throat, sores in mouth, or a toothache) UNUSUAL RASH, SWELLING OR PAIN  UNUSUAL VAGINAL DISCHARGE OR ITCHING   Items with * indicate a potential emergency and should be followed up as soon as possible or go to the Emergency Department if any problems should occur.  Please show the CHEMOTHERAPY ALERT CARD or IMMUNOTHERAPY ALERT CARD at  check-in to the Emergency Department and triage nurse.  Should you have questions after your visit or need to cancel or reschedule your appointment, please contact [place] [facility]  Dept: [phone]  and follow the prompts.  Office hours are 8:00 a.m. to 4:30 p.m. Monday - Friday. Please note that voicemails left after 4:00 p.m. may not be returned until the following business day.  We are closed weekends and major holidays. You have access to a nurse at all times for urgent questions. Please call the main number to the clinic Dept: [phone] and follow the prompts.   For any non-urgent questions, you may also contact your provider using MyChart. We now offer e-Visits for anyone 38 and older to request care online for non-urgent symptoms. For details visit [email or web address].   Also download the MyChart app! Go to the app store, search "MyChart", open the app, select [place], and log in with your MyChart username and password.

## 2023-01-13 ENCOUNTER — Other Ambulatory Visit: Payer: Self-pay

## 2023-02-01 ENCOUNTER — Ambulatory Visit: Payer: Commercial Managed Care - PPO

## 2023-02-01 ENCOUNTER — Ambulatory Visit: Payer: Commercial Managed Care - PPO | Admitting: Hematology

## 2023-02-01 ENCOUNTER — Other Ambulatory Visit: Payer: Commercial Managed Care - PPO

## 2023-02-02 ENCOUNTER — Inpatient Hospital Stay: Payer: Commercial Managed Care - PPO | Attending: Hematology

## 2023-02-02 VITALS — BP 116/76 | HR 78 | Temp 98.5°F | Resp 18 | Ht 67.0 in | Wt 205.3 lb

## 2023-02-02 DIAGNOSIS — Z170 Estrogen receptor positive status [ER+]: Secondary | ICD-10-CM | POA: Diagnosis not present

## 2023-02-02 DIAGNOSIS — C7951 Secondary malignant neoplasm of bone: Secondary | ICD-10-CM | POA: Insufficient documentation

## 2023-02-02 DIAGNOSIS — Z5111 Encounter for antineoplastic chemotherapy: Secondary | ICD-10-CM | POA: Insufficient documentation

## 2023-02-02 DIAGNOSIS — C50411 Malignant neoplasm of upper-outer quadrant of right female breast: Secondary | ICD-10-CM | POA: Diagnosis not present

## 2023-02-02 MED ORDER — PERTUZ-TRASTUZ-HYALURON-ZZXF 60-60-2000 MG-MG-U/ML CHEMO ~~LOC~~ SOLN
10.0000 mL | Freq: Once | SUBCUTANEOUS | Status: AC
  Administered 2023-02-02: 10 mL via SUBCUTANEOUS
  Filled 2023-02-02: qty 10

## 2023-02-02 MED ORDER — SODIUM CHLORIDE 0.9% FLUSH
10.0000 mL | Freq: Once | INTRAVENOUS | Status: AC
  Administered 2023-02-02: 10 mL

## 2023-02-02 MED ORDER — HEPARIN SOD (PORK) LOCK FLUSH 100 UNIT/ML IV SOLN
500.0000 [IU] | Freq: Once | INTRAVENOUS | Status: AC
  Administered 2023-02-02: 500 [IU]

## 2023-02-02 MED ORDER — SODIUM CHLORIDE 0.9 % IV SOLN: Freq: Once | INTRAVENOUS | Status: AC

## 2023-02-02 MED ORDER — ZOLEDRONIC ACID 4 MG/100ML IV SOLN
4.0000 mg | Freq: Once | INTRAVENOUS | Status: AC
  Administered 2023-02-02: 4 mg via INTRAVENOUS
  Filled 2023-02-02: qty 100

## 2023-02-02 MED ORDER — DIPHENHYDRAMINE HCL 25 MG PO CAPS
50.0000 mg | ORAL_CAPSULE | Freq: Once | ORAL | Status: AC
  Administered 2023-02-02: 50 mg via ORAL
  Filled 2023-02-02: qty 2

## 2023-02-02 MED ORDER — ACETAMINOPHEN 325 MG PO TABS
650.0000 mg | ORAL_TABLET | Freq: Once | ORAL | Status: AC
  Administered 2023-02-02: 650 mg via ORAL
  Filled 2023-02-02: qty 2

## 2023-02-02 NOTE — Patient Instructions (Signed)
[facility]  Discharge Instructions: Thank you for choosing [facility] to provide your oncology and hematology care.   If you have a lab appointment with the Cancer Center, please go directly to the Cancer Center and check in at the registration area.   Wear comfortable clothing and clothing appropriate for easy access to any Portacath or PICC line.   We strive to give you quality time with your provider. You may need to reschedule your appointment if you arrive late (15 or more minutes).  Arriving late affects you and other patients whose appointments are after yours.  Also, if you miss three or more appointments without notifying the office, you may be dismissed from the clinic at the provider's discretion.      For prescription refill requests, have your pharmacy contact our office and allow 72 hours for refills to be completed.    Today you received the following chemotherapy and/or immunotherapy agents P[place]      To help prevent nausea and vomiting after your treatment, we encourage you to take your nausea medication as directed.  BELOW ARE SYMPTOMS THAT SHOULD BE REPORTED IMMEDIATELY: *FEVER GREATER THAN 100.4 F (38 C) OR HIGHER *CHILLS OR SWEATING *NAUSEA AND VOMITING THAT IS NOT CONTROLLED WITH YOUR NAUSEA MEDICATION *UNUSUAL SHORTNESS OF BREATH *UNUSUAL BRUISING OR BLEEDING *URINARY PROBLEMS (pain or burning when urinating, or frequent urination) *BOWEL PROBLEMS (unusual diarrhea, constipation, pain near the anus) TENDERNESS IN MOUTH AND THROAT WITH OR WITHOUT PRESENCE OF ULCERS (sore throat, sores in mouth, or a toothache) UNUSUAL RASH, SWELLING OR PAIN  UNUSUAL VAGINAL DISCHARGE OR ITCHING   Items with * indicate a potential emergency and should be followed up as soon as possible or go to the Emergency Department if any problems should occur.  Please show the CHEMOTHERAPY ALERT CARD or IMMUNOTHERAPY ALERT CARD at check-in to  the Emergency Department and triage nurse.  Should you have questions after your visit or need to cancel or reschedule your appointment, please contact [facility]  Dept: [phone]  and follow the prompts.  Office hours are 8:00 a.m. to 4:30 p.m. Monday - Friday. Please note that voicemails left after 4:00 p.m. may not be returned until the following business day.  We are closed weekends and major holidays. You have access to a nurse at all times for urgent questions. Please call the main number to the clinic Dept: [phone] and follow the prompts.   For any non-urgent questions, you may also contact your provider using MyChart. We now offer e-Visits for anyone 10 and older to request care online for non-urgent symptoms. For details visit [email or web address].   Also download the MyChart app! Go to the app store, search "MyChart", open the app, select [place], and log in with your MyChart username and password.

## 2023-02-06 ENCOUNTER — Other Ambulatory Visit: Payer: Self-pay

## 2023-02-08 ENCOUNTER — Other Ambulatory Visit: Payer: Self-pay | Admitting: Hematology

## 2023-02-23 ENCOUNTER — Inpatient Hospital Stay: Payer: Commercial Managed Care - PPO | Attending: Hematology

## 2023-02-23 VITALS — BP 117/71 | HR 86 | Temp 97.4°F | Resp 18 | Ht 67.0 in | Wt 208.4 lb

## 2023-02-23 DIAGNOSIS — C50411 Malignant neoplasm of upper-outer quadrant of right female breast: Secondary | ICD-10-CM | POA: Insufficient documentation

## 2023-02-23 DIAGNOSIS — C787 Secondary malignant neoplasm of liver and intrahepatic bile duct: Secondary | ICD-10-CM | POA: Diagnosis not present

## 2023-02-23 DIAGNOSIS — C7951 Secondary malignant neoplasm of bone: Secondary | ICD-10-CM | POA: Diagnosis not present

## 2023-02-23 DIAGNOSIS — Z170 Estrogen receptor positive status [ER+]: Secondary | ICD-10-CM | POA: Insufficient documentation

## 2023-02-23 DIAGNOSIS — Z5111 Encounter for antineoplastic chemotherapy: Secondary | ICD-10-CM | POA: Insufficient documentation

## 2023-02-23 MED ORDER — DIPHENHYDRAMINE HCL 25 MG PO CAPS
50.0000 mg | ORAL_CAPSULE | Freq: Once | ORAL | Status: AC
  Administered 2023-02-23: 50 mg via ORAL
  Filled 2023-02-23: qty 2

## 2023-02-23 MED ORDER — ACETAMINOPHEN 325 MG PO TABS
650.0000 mg | ORAL_TABLET | Freq: Once | ORAL | Status: AC
  Administered 2023-02-23: 650 mg via ORAL
  Filled 2023-02-23: qty 2

## 2023-02-23 MED ORDER — PERTUZ-TRASTUZ-HYALURON-ZZXF 60-60-2000 MG-MG-U/ML CHEMO ~~LOC~~ SOLN
10.0000 mL | Freq: Once | SUBCUTANEOUS | Status: AC
  Administered 2023-02-23: 10 mL via SUBCUTANEOUS
  Filled 2023-02-23: qty 10

## 2023-02-23 NOTE — Patient Instructions (Signed)
[facility]  Discharge Instructions: Thank you for choosing [facility] to provide your oncology and hematology care.   If you have a lab appointment with the Cancer Center, please go directly to the Cancer Center and check in at the registration area.   Wear comfortable clothing and clothing appropriate for easy access to any Portacath or PICC line.   We strive to give you quality time with your provider. You may need to reschedule your appointment if you arrive late (15 or more minutes).  Arriving late affects you and other patients whose appointments are after yours.  Also, if you miss three or more appointments without notifying the office, you may be dismissed from the clinic at the provider's discretion.      For prescription refill requests, have your pharmacy contact our office and allow 72 hours for refills to be completed.    Today you received the following chemotherapy and/or immunotherapy agents P[place]      To help prevent nausea and vomiting after your treatment, we encourage you to take your nausea medication as directed.  BELOW ARE SYMPTOMS THAT SHOULD BE REPORTED IMMEDIATELY: *FEVER GREATER THAN 100.4 F (38 C) OR HIGHER *CHILLS OR SWEATING *NAUSEA AND VOMITING THAT IS NOT CONTROLLED WITH YOUR NAUSEA MEDICATION *UNUSUAL SHORTNESS OF BREATH *UNUSUAL BRUISING OR BLEEDING *URINARY PROBLEMS (pain or burning when urinating, or frequent urination) *BOWEL PROBLEMS (unusual diarrhea, constipation, pain near the anus) TENDERNESS IN MOUTH AND THROAT WITH OR WITHOUT PRESENCE OF ULCERS (sore throat, sores in mouth, or a toothache) UNUSUAL RASH, SWELLING OR PAIN  UNUSUAL VAGINAL DISCHARGE OR ITCHING   Items with * indicate a potential emergency and should be followed up as soon as possible or go to the Emergency Department if any problems should occur.  Please show the CHEMOTHERAPY ALERT CARD or IMMUNOTHERAPY ALERT CARD at check-in to  the Emergency Department and triage nurse.  Should you have questions after your visit or need to cancel or reschedule your appointment, please contact [facility]  Dept: [phone]  and follow the prompts.  Office hours are 8:00 a.m. to 4:30 p.m. Monday - Friday. Please note that voicemails left after 4:00 p.m. may not be returned until the following business day.  We are closed weekends and major holidays. You have access to a nurse at all times for urgent questions. Please call the main number to the clinic Dept: [phone] and follow the prompts.   For any non-urgent questions, you may also contact your provider using MyChart. We now offer e-Visits for anyone 10 and older to request care online for non-urgent symptoms. For details visit [email or web address].   Also download the MyChart app! Go to the app store, search "MyChart", open the app, select [place], and log in with your MyChart username and password.

## 2023-02-27 NOTE — Progress Notes (Signed)
CARDIO-ONCOLOGY NOTE  Referring Physician: Dr. Mosetta Putt Primary Care: None HF Cardiologist: Dr. Gala Romney  HPI:  Carolyn Stewart is 62 y.o. female with HTN and stage IV right breast cancer referred by Dr. Mosetta Putt for enrollment into the Cardio-Oncology program due to reduced EF on echo.   Diagnosed with R breast cancer in 8/21 (triple positive) found to have mets to bone. She received THP 01/03/20-05/27/20. Her restaging PET scan from 04/28/20 showed no hypermetabolic lesions. Given her excellent response, she was switched to maintenance therapy with Herceptin, Perjeta q3weeks and Letrozole starting 06/18/20 which is still ongoing.  Zometa started 06/18/20  In June had echo which showed EF 50-55% so H/P held. Losartan started.   Had covid in July. Received Paxlovid and recovered without need for hospital admission.  Echo 07/22 with EF 55-60%. Herceptin restarted.  Echo 9/22 EF 55% felt to be slightly down from before but still in normal range  Echo 03/09/21 EF 55%  Here for f/u today, continues on maintenance Herceptin every 3 weeks, tolerating well. 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 Doing great. No CP or SOB   Echo today 08/31/22 EF 55-60% GLS -18.0%   ECHO 10/13/21 EF 55-60% GLS -22.4% ECHO 06/16/21; EF 55-60% GLS -20% ECHO 07/22: EF 55-60% GLS 16 ECHO  09/22/20: EF 40-45%  (I felt 50-55%) ECHO 12/21: EF 60-65% GLS -18.2 ECHO 8/21: EF 60-65% G1DD   Review of Systems: Negative except as mentioned in HPI  Past Medical History:  Diagnosis Date   Cancer (HCC) 2021   Hypertension     Current Outpatient Medications  Medication Sig Dispense Refill   acetaminophen (TYLENOL) 500 MG tablet Take 1,000 mg by mouth every 6 (six) hours as needed for moderate pain or headache.     amLODipine (NORVASC) 10 MG tablet Take 2 tablets (20 mg total) by mouth daily. 60 tablet 11   Cholecalciferol (VITAMIN D3) 50 MCG (2000 UT) TABS Take 2,000 Units by mouth daily.      gabapentin (NEURONTIN)  100 MG capsule TAKE 2 CAPSULES BY MOUTH EVERY DAY AT BEDTIME 60 capsule 2   letrozole (FEMARA) 2.5 MG tablet Take 1 tablet (2.5 mg total) by mouth daily. 90 tablet 3   lidocaine-prilocaine (EMLA) cream Apply 1 application topically as needed. 30 g 1   losartan (COZAAR) 100 MG tablet Take 1 tablet (100 mg total) by mouth daily. 90 tablet 3   metoprolol succinate (TOPROL-XL) 100 MG 24 hr tablet Take 1 tablet (100 mg total) by mouth daily. 30 tablet 11   omeprazole (PRILOSEC OTC) 20 MG tablet Take 20 mg by mouth daily.     potassium chloride (KLOR-CON M) 10 MEQ tablet Take 1 tablet (10 mEq total) by mouth daily. 90 tablet 1   No current facility-administered medications for this visit.   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   Tobacco Use   Smoking status: Never   Smokeless tobacco: Never  Vaping Use   Vaping status: Never Used  Substance and Sexual Activity   Alcohol use: Yes    Comment: social drinker    Drug use: No   Sexual activity: Yes  Other Topics Concern   Not on file  Social History Narrative   Not on file   Social Determinants of Health   Financial Resource Strain: Low Risk  (12/03/2019)   Overall Financial Resource Strain (CARDIA)    Difficulty of Paying Living Expenses: Not hard at all  Food Insecurity: No Food Insecurity (12/03/2019)   Hunger Vital Sign    Worried About Running Out of Food in the Last Year: Never true    Ran Out of Food in the Last Year: Never true  Transportation Needs: No Transportation Needs  (12/03/2019)   PRAPARE - Administrator, Civil Service (Medical): No    Lack of Transportation (Non-Medical): No  Physical Activity: Not on file  Stress: Not on file  Social Connections: Not on file  Intimate Partner Violence: Not on file      Family History  Problem Relation Age of Onset   Hypertension Mother    Hypertension Father     There were no vitals filed for this visit.   Physical exam:  General:  Well appearing. No resp difficulty HEENT: normal Neck: supple. no JVD. Carotids 2+ bilat; no bruits. No lymphadenopathy or thryomegaly appreciated. Cor: PMI nondisplaced. Regular rate & rhythm. No rubs, gallops or murmurs. Lungs: clear Abdomen: soft, nontender, nondistended. No hepatosplenomegaly. No bruits or masses. Good bowel sounds. Extremities: no cyanosis, clubbing, rash, edema Neuro: alert & orientedx3, cranial nerves grossly intact. moves all 4 extremities w/o difficulty. Affect pleasant   EKG:  NSR, left axis dev. Mod voltage criteria for LVH, anterolateral infarct, age undetermined. 78bpm  ASSESSMENT & PLAN: 1.  Chemotherapy-induced CM - ECHO 8/21: EF 60-65% G1DD - ECHO 12/21: EF 60-65% GLS -18.2 - ECHO  09/22/20: EF 40-45%  (I felt 50-55%) - Herceptin held in June 2022 d/t mild cardiomyopathy. Restarted in August 2022 - ECHO 07/22: EF 55-60% GLS 16 - ECHO 9/22 F 55%. - Echo 03/09/21 EF 55% (completely stable) - Echo 06/16/21; EF 55-60% GLS -20% - Echo 10/13/21 EF 55-60% GLS -22.4% - Echo today 08/31/22 EF 55-60% GLS -18.0%  - Continue losartan 100 mg daily and Toprol 100 daily - Continue Herceptin. Stable, continue echos every 6 months  2. Stage IV Right Breast Cancer (triple positive) - Diagnosed with R breast cancer in 8/21 (triple positive) found to have mets to bone. She received THP 01/03/20-05/27/20. Her restaging PET scan from 04/28/20 showed no hypermetabolic lesions. Given her excellent response, she was switched to maintenance therapy with  Herceptin, Perjeta q3weeks and Letrozole starting 06/18/20.  Zometa started 06/18/20, will continue every 3 months. - Herceptin held 09/22/20 due to reduced EF, restarted in August as above.  - Now on maintenance Herceptin, Perjeta (Phesgo) q3weeks and Letrozole  - Tolerating Herceptin well. EF stable. Continue Herceptin   3. HTN -Blood pressure well controlled. Continue current regimen.   Jacklynn Ganong, FNP  10:04 AM

## 2023-02-28 ENCOUNTER — Ambulatory Visit (HOSPITAL_COMMUNITY)
Admission: RE | Admit: 2023-02-28 | Discharge: 2023-02-28 | Disposition: A | Discharge status: Home / Self Care | Payer: Commercial Managed Care - PPO | Source: Ambulatory Visit | Attending: Internal Medicine | Admitting: Internal Medicine

## 2023-02-28 ENCOUNTER — Ambulatory Visit (HOSPITAL_BASED_OUTPATIENT_CLINIC_OR_DEPARTMENT_OTHER)
Admission: RE | Admit: 2023-02-28 | Discharge: 2023-02-28 | Disposition: A | Discharge status: Home / Self Care | Payer: Commercial Managed Care - PPO | Source: Ambulatory Visit | Attending: Internal Medicine | Admitting: Internal Medicine

## 2023-02-28 ENCOUNTER — Encounter (HOSPITAL_COMMUNITY): Payer: Self-pay

## 2023-02-28 VITALS — BP 144/84 | HR 91 | Wt 207.8 lb

## 2023-02-28 DIAGNOSIS — T451X5A Adverse effect of antineoplastic and immunosuppressive drugs, initial encounter: Secondary | ICD-10-CM

## 2023-02-28 DIAGNOSIS — I110 Hypertensive heart disease with heart failure: Secondary | ICD-10-CM | POA: Diagnosis not present

## 2023-02-28 DIAGNOSIS — C50919 Malignant neoplasm of unspecified site of unspecified female breast: Secondary | ICD-10-CM

## 2023-02-28 DIAGNOSIS — Z79899 Other long term (current) drug therapy: Secondary | ICD-10-CM | POA: Diagnosis not present

## 2023-02-28 DIAGNOSIS — Z79811 Long term (current) use of aromatase inhibitors: Secondary | ICD-10-CM | POA: Diagnosis not present

## 2023-02-28 DIAGNOSIS — I509 Heart failure, unspecified: Secondary | ICD-10-CM | POA: Diagnosis not present

## 2023-02-28 DIAGNOSIS — I427 Cardiomyopathy due to drug and external agent: Secondary | ICD-10-CM | POA: Insufficient documentation

## 2023-02-28 DIAGNOSIS — I10 Essential (primary) hypertension: Secondary | ICD-10-CM | POA: Diagnosis not present

## 2023-02-28 DIAGNOSIS — C7951 Secondary malignant neoplasm of bone: Secondary | ICD-10-CM | POA: Diagnosis present

## 2023-02-28 DIAGNOSIS — C50911 Malignant neoplasm of unspecified site of right female breast: Secondary | ICD-10-CM | POA: Diagnosis present

## 2023-02-28 LAB — ECHOCARDIOGRAM COMPLETE
Area-P 1/2: 4.08 cm2
S' Lateral: 3 cm

## 2023-02-28 NOTE — Patient Instructions (Signed)
Medication Changes:  No Changes In Medications at this time.   Testing/Procedures:  Your physician has requested that you have an echocardiogram in 6 months. Echocardiography is a painless test that uses sound waves to create images of your heart. It provides your doctor with information about the size and shape of your heart and how well your heart's chambers and valves are working. This procedure takes approximately one hour. There are no restrictions for this procedure. Please do NOT wear cologne, perfume, aftershave, or lotions (deodorant is allowed). Please arrive 15 minutes prior to your appointment time.  Please note: We ask at that you not bring children with you during ultrasound (echo/ vascular) testing. Due to room size and safety concerns, children are not allowed in the ultrasound rooms during exams. Our front office staff cannot provide observation of children in our lobby area while testing is being conducted. An adult accompanying a patient to their appointment will only be allowed in the ultrasound room at the discretion of the ultrasound technician under special circumstances. We apologize for any inconvenience.  Follow-Up in: 6 MONTHS PLEASE CALL OUR OFFICE AROUND MARCH 2025 TO GET SCHEDULED FOR YOUR APPOINTMENT. PHONE NUMBER IS [phone] OPTION 2   At the [facility], you and your health needs are our priority. We have a designated team specialized in the treatment of Heart Failure. This Care Team includes your primary Heart Failure Specialized Cardiologist (physician), Advanced Practice Providers (APPs- Physician Assistants and Nurse Practitioners), and Pharmacist who all work together to provide you with the care you need, when you need it.   You may see any of the following providers on your designated Care Team at your next follow up:  Dr. Arvilla Meres Dr. Marca Ancona Dr. Dorthula Nettles Dr. Theresia Bough Tonye Becket, NP Robbie Lis,  [place] [facility] [place], [place] Brynda Peon, NP [place] Lee, NP Karle Plumber, PharmD   Please be sure to bring in all your medications bottles to every appointment.   Need to Contact us:  If you have any questions or concerns before your next appointment please send [place] a message through [place] or call our office at [phone].    TO LEAVE A MESSAGE FOR THE NURSE SELECT OPTION 2, PLEASE LEAVE A MESSAGE INCLUDING: YOUR NAME DATE OF BIRTH CALL BACK NUMBER REASON FOR CALL**this is important as we prioritize the call backs  YOU WILL RECEIVE A CALL BACK THE SAME DAY AS LONG AS YOU CALL BEFORE 4:00 PM

## 2023-03-02 ENCOUNTER — Other Ambulatory Visit: Payer: Self-pay | Admitting: Medical Genetics

## 2023-03-02 DIAGNOSIS — Z006 Encounter for examination for normal comparison and control in clinical research program: Secondary | ICD-10-CM

## 2023-03-15 ENCOUNTER — Encounter: Payer: Self-pay | Admitting: Hematology

## 2023-03-15 NOTE — Assessment & Plan Note (Addendum)
stage IV [phone]), with bone and possible liver mets,  ER+/PR+/HER2+, Grade II-III   --diagnosed in 11/2019 with large right breast mass measuring up to 11cm and right lymphadenopathy. 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 Left axillary node biopsy was benign.  -01/01/20 Liver biopsy was negative but 01/12/20 Bone Biopsy was positive for metastatic carcinoma from primary breast cancer. ER/PR/HER2 were all negative, possibly related to decalcification. -She received THP 01/03/20 - 2/3/2 -on maintenance Herceptin and Phesgo injection every 3 weeks -repeated CT scan 11/09/2022 showed stable disease in bone and liver, no concerns for new lesion or evidence of progression.  -Echocardiogram done 02/28/2023 showed EF  55 - 60% with no changes from previous study done 08/2022.  -today, 03/16/2023, she presents or Cycle 13 day 1

## 2023-03-15 NOTE — Telephone Encounter (Signed)
Telephone call  

## 2023-03-15 NOTE — Progress Notes (Unsigned)
Patient Care Team: Luciano Cutter, PA-C as PCP - General (Physician Assistant) Almond Lint, MD as Consulting Physician (General Surgery) Malachy Mood, MD as Consulting Physician (Hematology) Dorothy Puffer, MD as Consulting Physician (Radiation Oncology) Pershing Proud, RN as Oncology Nurse Navigator Donnelly Angelica, RN as Oncology Nurse Navigator  Clinic Day:  03/18/2023  Referring physician: Malachy Mood, MD  ASSESSMENT & PLAN:   Assessment & Plan: Malignant neoplasm of upper-outer quadrant of right breast in female, estrogen receptor positive (HCC) stage IV Z([id number]), with bone and possible liver mets,  ER+/PR+/HER2+, Grade II-III   --diagnosed in 11/2019 with large right breast mass measuring up to 11cm and right lymphadenopathy. 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 Left axillary node biopsy was benign.  -01/01/20 Liver biopsy was negative but 01/12/20 Bone Biopsy was positive for metastatic carcinoma from primary breast cancer. ER/PR/HER2 were all negative, possibly related to decalcification. -She received THP 01/03/20 - 2/3/2 -on maintenance Herceptin and Phesgo injection every 3 weeks -repeated CT scan 11/09/2022 showed stable disease in bone and liver, no concerns for new lesion or evidence of progression.  -Echocardiogram done 02/28/2023 showed EF  55 - 60% with no changes from previous study done 08/2022.  -today, 03/16/2023, she presents or Cycle 13 day 1    Plan: Labs reviewed  -CBC showing WBC 7.3; Hgb 13.5.; Hct 39.7; Plt 315; Anc 3.3. -CMP - K 3.5; glucose 118; BUN 16; Creatinine 0.75; eGFR > 60; Ca 9.2; LFTs normal.   -Reviewed echocardiogram done 02/28/2023 showing normal ejection fraction of 55 to 60% with no changes from prior study done in May 2024. Overall, doing well and tolerating trastuzumab and pertuzumab well. Proceed with cycle 13-day 1 today. Labs/flush, follow-up, and treatment in 3 weeks.   The  patient understands the plans discussed today and is in agreement with them.  She knows to contact our office if she develops concerns prior to her next appointment.  I provided 25 minutes of face-to-face time during this encounter and > 50% was spent counseling as documented under my assessment and plan.    Carlean Jews, NP  [facility] - A DEPT [facility] [address] [place] [address] Dept: [phone] Dept Fax: [phone]   No orders of the defined types were placed in this encounter.     CHIEF COMPLAINT:  CC: right breast cancer  Current Treatment:  trastuzumab and pertuzumab every 21 days  INTERVAL HISTORY:  Alexica is here today for repeat clinical assessment. She was last seen by [place], [place] on 11/11/2022. Has had echocardiogram 02/28/2023 showing EF of 55-60% and unchanged from prior echocardiogram.  Reports good toleration of current treatment.  Her appetite is good she denies joint pain.  She denies nausea vomiting.  Overall states she is doing well.  She denies fevers or chills. She denies pain. Her appetite is good. Her weight has been stable.  I have reviewed the past medical history, past surgical history, social history and family history with the patient and they are unchanged from previous note.  ALLERGIES:  has No Known Allergies.  MEDICATIONS:  Current Outpatient Medications  Medication Sig Dispense Refill   acetaminophen (TYLENOL) 500 MG tablet Take 1,000 mg by mouth every 6 (six) hours as needed for moderate pain or headache.     amLODipine (NORVASC) 10 MG tablet Take 2 tablets (20 mg total) by mouth daily.  60 tablet 11   Cholecalciferol (VITAMIN D3) 50 MCG (2000 UT) TABS Take 2,000 Units by mouth daily.      gabapentin (NEURONTIN) 100 MG capsule TAKE 2 CAPSULES BY MOUTH EVERY DAY AT BEDTIME 60 capsule 2   letrozole (FEMARA) 2.5 MG tablet Take 1 tablet (2.5 mg total) by mouth  daily. 90 tablet 3   losartan (COZAAR) 100 MG tablet Take 1 tablet (100 mg total) by mouth daily. 90 tablet 3   metoprolol succinate (TOPROL-XL) 100 MG 24 hr tablet Take 1 tablet (100 mg total) by mouth daily. 30 tablet 11   omeprazole (PRILOSEC OTC) 20 MG tablet Take 20 mg by mouth daily.     potassium chloride (KLOR-CON M) 10 MEQ tablet Take 1 tablet (10 mEq total) by mouth daily. 90 tablet 1   lidocaine-prilocaine (EMLA) cream Apply 1 Application topically as needed. 30 g 1   No current facility-administered medications for this visit.   Facility-Administered Medications Ordered in Other Visits  Medication Dose Route Frequency Provider Last Rate Last Admin   sodium chloride flush (NS) 0.9 % injection 10 mL  10 mL Intravenous PRN Malachy Mood, MD        HISTORY OF PRESENT ILLNESS:   Oncology History Overview Note  Cancer Staging Malignant neoplasm of upper-outer quadrant of right breast in female, estrogen receptor positive (HCC) Staging form: Breast, AJCC 8th Edition - Clinical stage from 11/25/2019: Stage IIA (cT3, cN1, cM0, G2, ER+, PR+, HER2+) - Signed by Malachy Mood, MD on 12/03/2019    Malignant neoplasm of upper-outer quadrant of right breast in female, estrogen receptor positive (HCC)  11/13/2019 Mammogram    Diagnostic Mammogram 11/13/19  IMPRESSION: -Highly suspicious mass and calcifications in the right breast centered between 9 and 12 o'clock spanning up to 11 cm.  -Multiple masses in the right axilla. One of the masses contains calcifications, denoted as mass number 2 measuring 9 x 11 by 10 mm. Several of these masses do not appear to represent definitive lymph nodes. Others may be small but abnormal appearing lymph nodes. Taken in total, a cluster of masses in the right axilla spans up to 3.7 cm.  -There are fibrocystic changes on the left. There are also some solid-appearing masses. A solid appearing mass at 10:30, 6 cm from the nipple measures 9 x 6 x 6 mm. An adjacent solid  mass at 10 o'clock, 6 cm from the nipple measures 8 x 6 by 5 mm. Another solid-appearing mass at 11 o'clock, 1 cm from the nipple measures 5 x 4 by 5 mm. Fibrocystic changes are seen at 10 o'clock, 12 o'clock, and 4 o'clock. Another solid-appearing mass seen at 5 o'clock, 4 cm from the nipple measuring 6 x 3 x 4 mm. There is a single abnormal node in the left axilla with a cortex measuring 5 mm and restriction of the fatty hilum.   11/25/2019 Initial Biopsy   Diagnosis 1. Breast, right, needle core biopsy, upper outer - INVASIVE MAMMARY CARCINOMA - MAMMARY CARCINOMA IN SITU WITH CALCIFICATIONS AND NECROSIS - SEE COMMENT 2. Lymph node, needle/core biopsy, right axilla - INVASIVE MAMMARY CARCINOMA - SEE COMMENT 3. Lymph node, needle/core biopsy, left axilla - BENIGN LYMPH NODE - NO CARCINOMA IDENTIFIED Microscopic Comment 1. The biopsy material shows an infiltrative proliferation of cells arranged linearly and in small clusters. Based on the biopsy, the carcinoma appears Nottingham grade 2 of 3 and measures 1.2 cm in greatest linear extent. E-cadherin and prognostic markers (ER/PR/ki-67/HER2)are pending and  will be reported in an addendum. Dr. Berneice Heinrich reviewed the case and agrees with the above diagnosis. These results were called to [facility] on November 26, 2019. 2. Definitive nodal tissue is not identified. This biopsy has a slightly different architecture morphology than what is seen in part 1. The carcinoma measures 0.7 cm in greatest dimension. E-cadherin and prognostic markers (ER, PR, Ki-67 and HER-2) are pending and will be reported in an addendum.   11/25/2019 Receptors her2   1. PROGNOSTIC INDICATORS Results: IMMUNOHISTOCHEMICAL AND MORPHOMETRIC ANALYSIS PERFORMED MANUALLY The tumor cells are POSITIVE for Her2 (3+). Estrogen Receptor: 90%, POSITIVE, STRONG STAINING INTENSITY Progesterone Receptor: 20%, POSITIVE, STRONG STAINING INTENSITY Proliferation  Marker Ki67: 30%  2. PROGNOSTIC INDICATORS Results: IMMUNOHISTOCHEMICAL AND MORPHOMETRIC ANALYSIS PERFORMED MANUALLY The tumor cells are POSITIVE for Her2 (3+). Estrogen Receptor: 90%, POSITIVE, STRONG STAINING INTENSITY Progesterone Receptor: 25%, POSITIVE, STRONG STAINING INTENSITY Proliferation Marker Ki67: 30%   11/25/2019 Cancer Staging   Staging form: Breast, AJCC 8th Edition - Clinical stage from 11/25/2019: Stage IIA (cT3, cN1, cM0, G2, ER+, PR+, HER2+) - Signed by Malachy Mood, MD on 12/03/2019   12/01/2019 Initial Diagnosis   Malignant neoplasm of upper-outer quadrant of right breast in female, estrogen receptor positive (HCC)   12/10/2019 Imaging   Bone Scan  IMPRESSION: Findings concerning for bony metastatic disease in the left acetabulum region, L3 vertebral body region, and anterolateral right tenth rib.   Increased uptake in several large joints likely is of arthropathic etiology.   12/11/2019 Imaging   CT CAP w contrast  IMPRESSION: Prominent glandular tissue in the central right breast with nipple retraction, likely corresponding to the patient's known right breast neoplasm.   Prominent right axillary nodes measuring up to 8 mm short axis, suspicious for nodal metastases in this clinical context.   3 mm subpleural pulmonary nodule in the right lower lobe, likely benign. However, follow-up CT chest is suggested in 3-6 months.   Multiple hepatic lesions, including a dominant 2.4 cm lesion in segment 6, which is considered indeterminate. Metastasis is not excluded. Consider MRI abdomen with/without contrast for further characterization.   11 mm sclerotic lesion along the left posterior aspect of the T7 vertebral body raises concern for isolated osseous metastasis. Correlate with priors if available. Benign vertebral hemangioma at T11.   12/12/2019 Breast MRI   IMPRESSION: 1. The biopsy proven malignancy in the right breast involves all 4 quadrants and spans up  to 9 cm by MRI. There is enhancement within the skin at the level of the nipple.   2. Multiple matted masses are seen in the right axilla, either abnormal metastatic lymph nodes or a combination of masses and metastatic lymph nodes. One of these has been previously biopsied showing invasive mammary carcinoma without definitive nodal tissue.   3.  There is a 1.5 cm Rotter's lymph node on the right.   4. There is markedly abnormal enhancement and edema in the right pectoralis major muscle spanning 6-7 cm, suspicious for metastatic disease.   5. There is diffuse nodular enhancement throughout the left breast, which may correspond with the solid-appearing masses seen on ultrasound. These all have a similar appearance on MRI.   6.  No findings of left axillary lymphadenopathy.   12/15/2019 Procedure   PAC placed    12/24/2019 Imaging   MRI abdomen  IMPRESSION: 1. Multifocal enhancing bone lesions are noted within the lumbar spine, and bony pelvis compatible with metastatic disease. 2. Four, small peripherally enhancing  cystic lesions are noted within the liver worrisome for metastatic disease. 3. 2 enhancing liver lesions are also noted with signal and enhancement characteristics consistent with benign liver hemangioma. 4. Well-circumscribed, enhancing T2 and T1 hypointense structure within the right adnexal region is indeterminate. It is unclear whether not this is arising from the right ovary, broad ligament or right side of uterine fundus. Differential considerations include fibrothecoma, versus pedunculated uterine leiomyoma. Metastatic disease is considered less favored. Attention on follow-up imaging is recommended.     01/03/2020 - 05/27/2020 Chemotherapy   First line THP q3weeks starting 01/03/20-05/27/20   01/05/2020 Imaging   US Abdomen  IMPRESSION: 1. Suspect hematoma in the medial right lobe of the liver in the area of previous biopsy. Note that recent MR does not show  lesion in this area. This area has ill-defined margins and measures 3.9 x 4.0 x 3.5 cm. No perihepatic fluid.   2. Probable hemangiomas elsewhere in the right lobe of the liver. Note that several smaller lesions seen on recent MR are not appreciable by ultrasound.   3.  Study otherwise unremarkable.   01/12/2020 Pathology Results   FINAL MICROSCOPIC DIAGNOSIS:   A. BONE, SCLEROTIC LESION, LEFT ANTERIOR ILIAC SPINE, BIOPSY:  - Metastatic carcinoma, consistent with patient's clinical history of  primary breast carcinoma  - See comment   COMMENT:  Immunohistochemical stains show that the tumor cells are positive for  CK7 and GATA3; and negative for CK20, consistent with above  interpretation.   PROGNOSTIC INDICATOR RESULTS:  The tumor cells are NEGATIVE for Her2 (0); see comment  Estrogen Receptor: NEGATIVE; see comment  Progesterone Receptor: NEGATIVE; see comment    03/23/2020 Imaging   CT CAP IMPRESSION: 1. Right lower lobe perifissural nodule is slightly more conspicuous on today's study. Close attention on follow-up recommended. 2. Interval progression of segment IV liver lesion, concerning for metastatic progression. The remaining visualized liver lesions are stable to smaller in the interval. 3. Interval evolution in appearance of thoracolumbar spinal lesions and left iliac crest lesion, potentially reflecting response to therapy/healing. No definite new osseous lesions on today's study. 4. New small fluid collection right cul-de-sac. 5. Probable fibroid change in the uterus including exophytic right fundal lesion. 6. Aortic Atherosclerosis (ICD10-I70.0).   03/31/2020 Imaging   MR ABD IMPRESSION: 1. Enlarging hepatic lesion in hepatic subsegment IV suspicious for hepatic metastasis. The it is uncertain whether the change in the short interval is due to technical factors with different modalities or true enlargement. 2. Lesion in the posterior RIGHT hepatic lobe with  imaging features that are atypical for hemangioma but stability and signs of enhancement on later phase raising this question. Another more classic appearing may angioma seen inferior to this location in hepatic subsegment VI. Attention on follow-up. 3. Heterogeneous enhancement throughout the spine particularly at L3 as exhibited on the prior study with very patchy marrow signal remains suspicious for bony metastatic lesions in this patient with known bone metastases.   04/01/2020 Imaging   Bone scan IMPRESSION: Good response to therapy with resolution of sites of uptake seen previously at the cervical spine and posterior RIGHT tenth rib as well as a diminished degree of uptake at remaining previously identified osseous metastatic foci.   04/28/2020 PET scan   IMPRESSION: 1. No hypermetabolic lesions to suggest active metastatic disease involving the liver or osseous structures. 2. Stable small low-attenuation liver lesions, likely treated disease.   06/18/2020 -  Chemotherapy   Given her excellent response, I switched to  maintenance therapy with Herceptin, Perjeta (Phesgo) q3weeks and Letrozole.   06/18/2020 -  Chemotherapy   Zometa starting 06/18/20   06/18/2020 -  Anti-estrogen oral therapy   Letrozole 2.5mg  once dialy.    06/18/2020 - 06/16/2022 Chemotherapy   Patient is on Treatment Plan : BREAST Trastuzumab + Pertuzumab q21d     09/03/2020 Imaging   Bone Scan IMPRESSION: 1. Significant interval improvement compared to previous studies. Only mild uptake remains in the pelvis as above. There has been significant interval improvement over time.  CT C/A/P IMPRESSION: 1. Hypodense liver lesions are slightly decreased in size compared to prior examination, consistent with treatment response of hepatic metastatic disease. 2. Unchanged sclerotic lesion of the anterior left iliac crest. Unchanged lytic superior endplate deformity of L3, which remains equivocal for metastatic  lesion versus mechanical Schmorl deformity. No CT evidence of new osseous metastatic disease. 3. No evidence of new metastatic disease in the chest, abdomen, or pelvis. 4. Coronary artery disease.   05/05/2022 Imaging    IMPRESSION: 1. Interval decreased size of the largest hepatic metastatic lesion with stable size of the additional subcentimeter bilobar hepatic lesions. Continued decrease in size of the bilobar hepatic metastases. 2. Similar appearance of the osseous metatatic disease. No new aggressive lytic or blastic lesion of bone. 3. No evidence of new or progressive metastatic disease in the chest, abdomen or pelvis.     05/05/2022 Imaging    IMPRESSION: Faint residual radiotracer uptake in the left hemipelvis.   New focus of mild abnormal radiotracer uptake in the right lateral seventh rib without discrete CT correlate on same day examination, nonspecific suggest correlation with history of interval trauma.     07/07/2022 -  Chemotherapy   Patient is on Treatment Plan : BREAST Trastuzumab + Pertuzumab q21d     02/28/2023 Echocardiogram   IMPRESSIONS   1. Left ventricular ejection fraction, by estimation, is 55 to 60%. The left ventricle has normal function. The left ventricle has no regional wall motion abnormalities. Left ventricular diastolic parameters are consistent with Grade I diastolic  dysfunction (impaired relaxation). The average left ventricular global longitudinal strain is -17.1 %. The global longitudinal strain is normal.   2. Right ventricular systolic function is normal. The right ventricular size is normal. There is normal pulmonary artery systolic pressure. The estimated right ventricular systolic pressure is 28.8 mmHg.   3. The mitral valve is normal in structure. No evidence of mitral valve regurgitation. No evidence of mitral stenosis.   4. The aortic valve is tricuspid. Aortic valve regurgitation is trivial. No aortic stenosis is present.   5. The  inferior vena cava is normal in size with greater than 50% respiratory variability, suggesting right atrial pressure of 3 mmHg.  Comparison(s): No significant change from prior study. Prior GLS: -18.0.          REVIEW OF SYSTEMS:   Constitutional: Denies fevers, chills or abnormal weight loss Eyes: Denies blurriness of vision Ears, nose, mouth, throat, and face: Denies mucositis or sore throat Respiratory: Denies cough, dyspnea or wheezes Cardiovascular: Denies palpitation, chest discomfort or lower extremity swelling Gastrointestinal:  Denies nausea, heartburn or change in bowel habits Skin: Denies abnormal skin rashes Lymphatics: Denies new lymphadenopathy or easy bruising Neurological:Denies numbness, tingling or new weaknesses Behavioral/Psych: Mood is stable, no new changes  All other systems were reviewed with the patient and are negative.   VITALS:   Today's Vitals   03/16/23 0823 03/16/23 0826 03/16/23 0851  BP: (!) 142/71 Marland Kitchen)  124/57   Pulse: 91    Resp: 16    Temp: (!) 97.4 F (36.3 C)    TempSrc: Temporal    SpO2: 100%    Weight: 208 lb 9.6 oz (94.6 kg)    Height: 5\' 7"  (1.702 m)    PainSc:   0-No pain   Body mass index is 32.67 kg/m.   Wt Readings from Last 3 Encounters:  03/16/23 208 lb 9.6 oz (94.6 kg)  02/28/23 207 lb 12.8 oz (94.3 kg)  02/23/23 208 lb 6.4 oz (94.5 kg)    Body mass index is 32.67 kg/m.  Performance status (ECOG): 1 - Symptomatic but completely ambulatory  PHYSICAL EXAM:   GENERAL:alert, no distress and comfortable SKIN: skin color, texture, turgor are normal, no rashes or significant lesions EYES: normal, Conjunctiva are pink and non-injected, sclera clear OROPHARYNX:no exudate, no erythema and lips, buccal mucosa, and tongue normal  NECK: supple, thyroid normal size, non-tender, without nodularity LYMPH:  no palpable lymphadenopathy in the cervical, axillary or inguinal LUNGS: clear to auscultation and percussion with normal  breathing effort HEART: regular rate & rhythm and no murmurs and no lower extremity edema ABDOMEN:abdomen soft, non-tender and normal bowel sounds Musculoskeletal:no cyanosis of digits and no clubbing  NEURO: alert & oriented x 3 with fluent speech, no focal motor/sensory deficits  LABORATORY DATA:  I have reviewed the data as listed    Component Value Date/Time   NA 141 03/16/2023 0755   K 3.5 03/16/2023 0755   CL 110 03/16/2023 0755   CO2 26 03/16/2023 0755   GLUCOSE 118 (H) 03/16/2023 0755   BUN 16 03/16/2023 0755   CREATININE 0.75 03/16/2023 0755   CALCIUM 9.2 03/16/2023 0755   PROT 7.1 03/16/2023 0755   ALBUMIN 4.2 03/16/2023 0755   AST 16 03/16/2023 0755   ALT 10 03/16/2023 0755   ALKPHOS 91 03/16/2023 0755   BILITOT 0.5 03/16/2023 0755   GFRNONAA >60 03/16/2023 0755   GFRAA >60 01/22/2020 0824    Lab Results  Component Value Date   WBC 7.3 03/16/2023   NEUTROABS 3.3 03/16/2023   HGB 13.5 03/16/2023   HCT 39.7 03/16/2023   MCV 89.4 03/16/2023   PLT 315 03/16/2023     RADIOGRAPHIC STUDIES: ECHOCARDIOGRAM COMPLETE  Result Date: 02/28/2023    ECHOCARDIOGRAM REPORT   Patient Name:   LURIE CETRONE Date of Exam: 02/28/2023 Medical Rec #:  [id number]      Height:       67.0 in Accession #:    [id number]     Weight:       208.4 lb Date of Birth:  1960-05-15      BSA:          2.058 m Patient Age:    62 years       BP:           118/74 mmHg Patient Gender: F              HR:           99 bpm. Exam Location:  Outpatient Procedure: 2D Echo, Color Doppler, Cardiac Doppler and Strain Analysis Indications:    I50.9* Heart failure (unspecified)  History:        Patient has prior history of Echocardiogram examinations, most                 recent 08/31/2022. Risk Factors:Hypertension and Breast Cancer.  Sonographer:    Irving Burton Senior RDCS Referring Phys: [phone] DANIEL R  BENSIMHON  Sonographer Comments: Suboptimal apical window due to scar tissue IMPRESSIONS  1. Left ventricular ejection  fraction, by estimation, is 55 to 60%. The left ventricle has normal function. The left ventricle has no regional wall motion abnormalities. Left ventricular diastolic parameters are consistent with Grade I diastolic dysfunction (impaired relaxation). The average left ventricular global longitudinal strain is -17.1 %. The global longitudinal strain is normal.  2. Right ventricular systolic function is normal. The right ventricular size is normal. There is normal pulmonary artery systolic pressure. The estimated right ventricular systolic pressure is 28.8 mmHg.  3. The mitral valve is normal in structure. No evidence of mitral valve regurgitation. No evidence of mitral stenosis.  4. The aortic valve is tricuspid. Aortic valve regurgitation is trivial. No aortic stenosis is present.  5. The inferior vena cava is normal in size with greater than 50% respiratory variability, suggesting right atrial pressure of 3 mmHg. Comparison(s): No significant change from prior study. Prior GLS: -18.0. FINDINGS  Left Ventricle: Left ventricular ejection fraction, by estimation, is 55 to 60%. The left ventricle has normal function. The left ventricle has no regional wall motion abnormalities. The average left ventricular global longitudinal strain is -17.1 %. The global longitudinal strain is normal. The left ventricular internal cavity size was normal in size. There is no left ventricular hypertrophy. Left ventricular diastolic parameters are consistent with Grade I diastolic dysfunction (impaired relaxation). Right Ventricle: The right ventricular size is normal. No increase in right ventricular wall thickness. Right ventricular systolic function is normal. There is normal pulmonary artery systolic pressure. The tricuspid regurgitant velocity is 2.54 m/s, and  with an assumed right atrial pressure of 3 mmHg, the estimated right ventricular systolic pressure is 28.8 mmHg. Left Atrium: Left atrial size was normal in size. Right  Atrium: Right atrial size was normal in size. Pericardium: There is no evidence of pericardial effusion. Mitral Valve: The mitral valve is normal in structure. No evidence of mitral valve regurgitation. No evidence of mitral valve stenosis. Tricuspid Valve: The tricuspid valve is normal in structure. Tricuspid valve regurgitation is mild . No evidence of tricuspid stenosis. Aortic Valve: The aortic valve is tricuspid. Aortic valve regurgitation is trivial. No aortic stenosis is present. Pulmonic Valve: The pulmonic valve was normal in structure. Pulmonic valve regurgitation is trivial. No evidence of pulmonic stenosis. Aorta: The aortic root is normal in size and structure. Venous: The inferior vena cava is normal in size with greater than 50% respiratory variability, suggesting right atrial pressure of 3 mmHg. IAS/Shunts: No atrial level shunt detected by color flow Doppler.  LEFT VENTRICLE PLAX 2D LVIDd:         4.70 cm   Diastology LVIDs:         3.00 cm   LV e' lateral:   5.44 cm/s LV PW:         1.00 cm   LV E/e' lateral: 10.0 LV IVS:        1.00 cm LVOT diam:     2.10 cm   2D Longitudinal Strain LV SV:         61        2D Strain GLS Avg:     -17.1 % LV SV Index:   30 LVOT Area:     3.46 cm  RIGHT VENTRICLE RV S prime:     15.30 cm/s TAPSE (M-mode): 1.9 cm LEFT ATRIUM             Index  RIGHT ATRIUM           Index LA diam:        3.50 cm 1.70 cm/m   RA Area:     13.30 cm LA Vol (A2C):   39.7 ml 19.29 ml/m  RA Volume:   28.90 ml  14.04 ml/m LA Vol (A4C):   49.7 ml 24.15 ml/m LA Biplane Vol: 44.5 ml 21.62 ml/m  AORTIC VALVE LVOT Vmax:   89.90 cm/s LVOT Vmean:  71.100 cm/s LVOT VTI:    0.177 m  AORTA Ao Root diam: 3.30 cm Ao Asc diam:  3.80 cm MITRAL VALVE               TRICUSPID VALVE MV Area (PHT): 4.08 cm    TR Peak grad:   25.8 mmHg MV Decel Time: 186 msec    TR Vmax:        254.00 cm/s MV E velocity: 54.60 cm/s MV A velocity: 92.50 cm/s  SHUNTS MV E/A ratio:  0.59        Systemic VTI:  0.18 m                             Systemic Diam: 2.10 cm Donato Schultz MD Electronically signed by Donato Schultz MD Signature Date/Time: 02/28/2023/10:53:10 AM    Final

## 2023-03-16 ENCOUNTER — Other Ambulatory Visit: Payer: Self-pay | Admitting: Hematology and Oncology

## 2023-03-16 ENCOUNTER — Inpatient Hospital Stay: Payer: Commercial Managed Care - PPO

## 2023-03-16 ENCOUNTER — Inpatient Hospital Stay: Payer: Commercial Managed Care - PPO | Admitting: Nurse Practitioner

## 2023-03-16 VITALS — BP 117/75 | HR 78 | Temp 97.5°F | Resp 16

## 2023-03-16 VITALS — BP 124/57 | HR 91 | Temp 97.4°F | Resp 16 | Ht 67.0 in | Wt 208.6 lb

## 2023-03-16 DIAGNOSIS — Z170 Estrogen receptor positive status [ER+]: Secondary | ICD-10-CM

## 2023-03-16 DIAGNOSIS — C50411 Malignant neoplasm of upper-outer quadrant of right female breast: Secondary | ICD-10-CM | POA: Diagnosis not present

## 2023-03-16 DIAGNOSIS — Z5111 Encounter for antineoplastic chemotherapy: Secondary | ICD-10-CM | POA: Diagnosis not present

## 2023-03-16 LAB — CMP (CANCER CENTER ONLY)
ALT: 10 U/L (ref 0–44)
AST: 16 U/L (ref 15–41)
Albumin: 4.2 g/dL (ref 3.5–5.0)
Alkaline Phosphatase: 91 U/L (ref 38–126)
Anion gap: 5 (ref 5–15)
BUN: 16 mg/dL (ref 8–23)
CO2: 26 mmol/L (ref 22–32)
Calcium: 9.2 mg/dL (ref 8.9–10.3)
Chloride: 110 mmol/L (ref 98–111)
Creatinine: 0.75 mg/dL (ref 0.44–1.00)
GFR, Estimated: >60 mL/min (ref >60)
Glucose, Bld: 118 mg/dL — ABNORMAL HIGH (ref 70–99)
Potassium: 3.5 mmol/L (ref 3.5–5.1)
Sodium: 141 mmol/L (ref 135–145)
Total Bilirubin: 0.5 mg/dL (ref <1.2)
Total Protein: 7.1 g/dL (ref 6.5–8.1)

## 2023-03-16 LAB — CBC WITH DIFFERENTIAL (CANCER CENTER ONLY)
Abs Immature Granulocytes: 0.02 10*3/uL (ref 0.00–0.07)
Basophils Absolute: 0 10*3/uL (ref 0.0–0.1)
Basophils Relative: 0 %
Eosinophils Absolute: 0.2 10*3/uL (ref 0.0–0.5)
Eosinophils Relative: 3 %
HCT: 39.7 % (ref 36.0–46.0)
Hemoglobin: 13.5 g/dL (ref 12.0–15.0)
Immature Granulocytes: 0 %
Lymphocytes Relative: 44 %
Lymphs Abs: 3.2 10*3/uL (ref 0.7–4.0)
MCH: 30.4 pg (ref 26.0–34.0)
MCHC: 34 g/dL (ref 30.0–36.0)
MCV: 89.4 fL (ref 80.0–100.0)
Monocytes Absolute: 0.5 10*3/uL (ref 0.1–1.0)
Monocytes Relative: 7 %
Neutro Abs: 3.3 10*3/uL (ref 1.7–7.7)
Neutrophils Relative %: 46 %
Platelet Count: 315 10*3/uL (ref 150–400)
RBC: 4.44 MIL/uL (ref 3.87–5.11)
RDW: 14.2 % (ref 11.5–15.5)
WBC Count: 7.3 10*3/uL (ref 4.0–10.5)
nRBC: 0 % (ref 0.0–0.2)

## 2023-03-16 MED ORDER — PERTUZ-TRASTUZ-HYALURON-ZZXF 60-60-2000 MG-MG-U/ML CHEMO ~~LOC~~ SOLN
10.0000 mL | Freq: Once | SUBCUTANEOUS | Status: AC
  Administered 2023-03-16: 10 mL via SUBCUTANEOUS
  Filled 2023-03-16: qty 10

## 2023-03-16 MED ORDER — SODIUM CHLORIDE 0.9% FLUSH
10.0000 mL | Freq: Once | INTRAVENOUS | Status: AC
Start: 2023-03-16 — End: 2023-03-16
  Administered 2023-03-16: 10 mL

## 2023-03-16 MED ORDER — ACETAMINOPHEN 325 MG PO TABS
650.0000 mg | ORAL_TABLET | Freq: Once | ORAL | Status: AC
  Administered 2023-03-16: 650 mg via ORAL
  Filled 2023-03-16: qty 2

## 2023-03-16 MED ORDER — LIDOCAINE-PRILOCAINE 2.5-2.5 % EX CREA: 1.0000 | TOPICAL_CREAM | CUTANEOUS | 1 refills | Status: AC | PRN

## 2023-03-16 MED ORDER — DIPHENHYDRAMINE HCL 25 MG PO CAPS
50.0000 mg | ORAL_CAPSULE | Freq: Once | ORAL | Status: AC
  Administered 2023-03-16: 50 mg via ORAL
  Filled 2023-03-16: qty 2

## 2023-03-16 NOTE — Patient Instructions (Signed)
[facility] - A DEPT OF [facility]  Discharge Instructions: Thank you for choosing [facility] to provide your oncology and hematology care.   If you have a lab appointment with the Cancer Center, please go directly to the Cancer Center and check in at the registration area.   Wear comfortable clothing and clothing appropriate for easy access to any Portacath or PICC line.   We strive to give you quality time with your provider. You may need to reschedule your appointment if you arrive late (15 or more minutes).  Arriving late affects you and other patients whose appointments are after yours.  Also, if you miss three or more appointments without notifying the office, you may be dismissed from the clinic at the provider's discretion.      For prescription refill requests, have your pharmacy contact our office and allow 72 hours for refills to be completed.    Today you received the following chemotherapy and/or immunotherapy agent: Pertuzumab-Traztusumab hyluronidase (Phesgo)      To help prevent nausea and vomiting after your treatment, we encourage you to take your nausea medication as directed.  BELOW ARE SYMPTOMS THAT SHOULD BE REPORTED IMMEDIATELY: *FEVER GREATER THAN 100.4 F (38 C) OR HIGHER *CHILLS OR SWEATING *NAUSEA AND VOMITING THAT IS NOT CONTROLLED WITH YOUR NAUSEA MEDICATION *UNUSUAL SHORTNESS OF BREATH *UNUSUAL BRUISING OR BLEEDING *URINARY PROBLEMS (pain or burning when urinating, or frequent urination) *BOWEL PROBLEMS (unusual diarrhea, constipation, pain near the anus) TENDERNESS IN MOUTH AND THROAT WITH OR WITHOUT PRESENCE OF ULCERS (sore throat, sores in mouth, or a toothache) UNUSUAL RASH, SWELLING OR PAIN  UNUSUAL VAGINAL DISCHARGE OR ITCHING   Items with * indicate a potential emergency and should be followed up as soon as possible or go to the Emergency Department if any problems should occur.  Please show the  CHEMOTHERAPY ALERT CARD or IMMUNOTHERAPY ALERT CARD at check-in to the Emergency Department and triage nurse.  Should you have questions after your visit or need to cancel or reschedule your appointment, please contact [facility] - A DEPT OF [facility]  Dept: [phone]  and follow the prompts.  Office hours are 8:00 a.m. to 4:30 p.m. Monday - Friday. Please note that voicemails left after 4:00 p.m. may not be returned until the following business day.  We are closed weekends and major holidays. You have access to a nurse at all times for urgent questions. Please call the main number to the clinic Dept: [phone] and follow the prompts.   For any non-urgent questions, you may also contact your provider using MyChart. We now offer e-Visits for anyone 78 and older to request care online for non-urgent symptoms. For details visit [email or web address].   Also download the MyChart app! Go to the app store, search "MyChart", open the app, select , and log in with your MyChart username and password.  Pertuzumab; Trastuzumab; Hyaluronidase Injection What is this medication? PERTUZUMAB; TRASTUZUMAB; HYALURONIDASE (per TOOZ ue mab; tras TOO zoo mab; hye al ur ON i dase) treats breast cancer. Pertuzumab and trastuzumab work by blocking a protein that causes cancer cells to grow and multiply. This helps to slow or stop the spread of cancer cells. Hyaluronidase works by increasing the absorption of other medications in the body to help them work better. It is a combination medication that contains two monoclonal antibodies. This medicine may be used for other purposes; ask your health care  provider or pharmacist if you have questions. COMMON BRAND NAME(S): PHESGO What should I tell my care team before I take this medication? They need to know if you have any of these conditions: Heart failure High blood pressure Irregular heartbeat or rhythm Lung  disease An unusual or allergic reaction to pertuzumab, trastuzumab, hyaluronidase, other medications, foods, dyes, or preservatives Pregnant or trying to get pregnant Breast-feeding How should I use this medication? This medication is injected under the skin. It is usually given by your care team in a hospital or clinic setting. It may also be given at home by your care team. Talk to your care team about the use of this medication in children. Special care may be needed. Overdosage: If you think you have taken too much of this medicine contact a poison control center or emergency room at once. NOTE: This medicine is only for you. Do not share this medicine with others. What if I miss a dose? Keep appointments for follow-up doses. It is important not to miss your dose. Call your care team if you are unable to keep an appointment. What may interact with this medication? Certain types of chemotherapy, such as daunorubicin, doxorubicin, epirubicin, idarubicin This list may not describe all possible interactions. Give your health care provider a list of all the medicines, herbs, non-prescription drugs, or dietary supplements you use. Also tell them if you smoke, drink alcohol, or use illegal drugs. Some items may interact with your medicine. What should I watch for while using this medication? Your condition will be monitored carefully while you are receiving this medication. This medication may make you feel generally unwell. This is not uncommon as chemotherapy can affect healthy cells as well as cancer cells. Report any side effects. Continue your course of treatment even though you feel ill unless your care team tells you to stop. This medication may increase your risk of getting an infection. Call your care team for advice if you get a fever, chills, sore throat, or other symptoms of a cold or flu. Do not treat yourself. Try to avoid being around people who are sick. Avoid taking medications that  contain aspirin, acetaminophen, ibuprofen, naproxen, or ketoprofen unless instructed by your care team. These medications may hide a fever. Talk to your care team if you may be pregnant. Serious birth defects can occur if you take this medication during pregnancy and for 7 months after the last dose. You will need a negative pregnancy test before starting this medication. Contraception is recommended while taking this medication and for 7 months after the last dose. Your care team can help you find the option that works for you. Do not breastfeed while taking this medication and for 7 months after the last dose. What side effects may I notice from receiving this medication? Side effects that you should report to your care team as soon as possible: Allergic reactions or angioedema--skin rash, itching or hives, swelling of the face, eyes, lips, tongue, arms, or legs, trouble swallowing or breathing Dry cough, shortness of breath or trouble breathing Heart failure--shortness of breath, swelling of the ankles, feet, or hands, sudden weight gain, unusual weakness or fatigue Infection--fever, chills, cough, or sore throat Infusion reactions--chest pain, shortness of breath or trouble breathing, feeling faint or lightheaded Side effects that usually do not require medical attention (report these to your care team if they continue or are bothersome): Diarrhea Hair loss Nausea Pain, redness, or irritation at injection site Pain, redness, or swelling with  sores inside the mouth or throat Unusual weakness or fatigue This list may not describe all possible side effects. Call your doctor for medical advice about side effects. You may report side effects to FDA at 1-800-FDA-1088. Where should I keep my medication? This medication is given in a hospital or clinic. It will not be stored at home. NOTE: This sheet is a summary. It may not cover all possible information. If you have questions about this medicine,  talk to your doctor, pharmacist, or health care provider.  2024 Elsevier/Gold Standard (2021-08-26 00:00:00)

## 2023-03-17 ENCOUNTER — Other Ambulatory Visit: Payer: Self-pay

## 2023-03-17 LAB — CANCER ANTIGEN 27.29: CA 27.29: 41.8 U/mL — ABNORMAL HIGH (ref 0.0–38.6)

## 2023-03-18 ENCOUNTER — Encounter: Payer: Self-pay | Admitting: Hematology

## 2023-03-18 ENCOUNTER — Encounter: Payer: Self-pay | Admitting: Nurse Practitioner

## 2023-03-19 ENCOUNTER — Encounter: Payer: Self-pay | Admitting: Nurse Practitioner

## 2023-04-03 ENCOUNTER — Other Ambulatory Visit: Payer: Self-pay

## 2023-04-06 ENCOUNTER — Inpatient Hospital Stay: Payer: Commercial Managed Care - PPO | Attending: Hematology

## 2023-04-06 VITALS — BP 127/75 | HR 71 | Temp 98.2°F | Resp 18 | Ht 67.0 in | Wt 212.4 lb

## 2023-04-06 DIAGNOSIS — Z5111 Encounter for antineoplastic chemotherapy: Secondary | ICD-10-CM | POA: Insufficient documentation

## 2023-04-06 DIAGNOSIS — C7951 Secondary malignant neoplasm of bone: Secondary | ICD-10-CM | POA: Insufficient documentation

## 2023-04-06 DIAGNOSIS — Z170 Estrogen receptor positive status [ER+]: Secondary | ICD-10-CM | POA: Diagnosis not present

## 2023-04-06 DIAGNOSIS — C50411 Malignant neoplasm of upper-outer quadrant of right female breast: Secondary | ICD-10-CM | POA: Insufficient documentation

## 2023-04-06 MED ORDER — DIPHENHYDRAMINE HCL 25 MG PO CAPS
50.0000 mg | ORAL_CAPSULE | Freq: Once | ORAL | Status: AC
  Administered 2023-04-06: 50 mg via ORAL
  Filled 2023-04-06: qty 2

## 2023-04-06 MED ORDER — ACETAMINOPHEN 325 MG PO TABS
650.0000 mg | ORAL_TABLET | Freq: Once | ORAL | Status: AC
  Administered 2023-04-06: 650 mg via ORAL
  Filled 2023-04-06: qty 2

## 2023-04-06 MED ORDER — PERTUZ-TRASTUZ-HYALURON-ZZXF 60-60-2000 MG-MG-U/ML CHEMO ~~LOC~~ SOLN
10.0000 mL | Freq: Once | SUBCUTANEOUS | Status: AC
  Administered 2023-04-06: 10 mL via SUBCUTANEOUS
  Filled 2023-04-06: qty 10

## 2023-04-06 NOTE — Patient Instructions (Signed)
[facility] - A DEPT OF [facility]  Discharge Instructions: Thank you for choosing [facility] to provide your oncology and hematology care.   If you have a lab appointment with the Cancer Center, please go directly to the Cancer Center and check in at the registration area.   Wear comfortable clothing and clothing appropriate for easy access to any Portacath or PICC line.   We strive to give you quality time with your provider. You may need to reschedule your appointment if you arrive late (15 or more minutes).  Arriving late affects you and other patients whose appointments are after yours.  Also, if you miss three or more appointments without notifying the office, you may be dismissed from the clinic at the provider's discretion.      For prescription refill requests, have your pharmacy contact our office and allow 72 hours for refills to be completed.    Today you received the following chemotherapy and/or immunotherapy agents Phesgo      To help prevent nausea and vomiting after your treatment, we encourage you to take your nausea medication as directed.  BELOW ARE SYMPTOMS THAT SHOULD BE REPORTED IMMEDIATELY: *FEVER GREATER THAN 100.4 F (38 C) OR HIGHER *CHILLS OR SWEATING *NAUSEA AND VOMITING THAT IS NOT CONTROLLED WITH YOUR NAUSEA MEDICATION *UNUSUAL SHORTNESS OF BREATH *UNUSUAL BRUISING OR BLEEDING *URINARY PROBLEMS (pain or burning when urinating, or frequent urination) *BOWEL PROBLEMS (unusual diarrhea, constipation, pain near the anus) TENDERNESS IN MOUTH AND THROAT WITH OR WITHOUT PRESENCE OF ULCERS (sore throat, sores in mouth, or a toothache) UNUSUAL RASH, SWELLING OR PAIN  UNUSUAL VAGINAL DISCHARGE OR ITCHING   Items with * indicate a potential emergency and should be followed up as soon as possible or go to the Emergency Department if any problems should occur.  Please show the CHEMOTHERAPY ALERT CARD or IMMUNOTHERAPY  ALERT CARD at check-in to the Emergency Department and triage nurse.  Should you have questions after your visit or need to cancel or reschedule your appointment, please contact [facility] - [facility]  Dept: [phone]  and follow the prompts.  Office hours are 8:00 a.m. to 4:30 p.m. Monday - Friday. Please note that voicemails left after 4:00 p.m. may not be returned until the following business day.  We are closed weekends and major holidays. You have access to a nurse at all times for urgent questions. Please call the main number to the clinic Dept: [phone] and follow the prompts.   For any non-urgent questions, you may also contact your provider using MyChart. We now offer e-Visits for anyone 62 and older to request care online for non-urgent symptoms. For details visit [email or web address].   Also download the MyChart app! Go to the app store, search "MyChart", open the app, select [place], and log in with your MyChart username and password.

## 2023-04-20 ENCOUNTER — Other Ambulatory Visit (HOSPITAL_COMMUNITY)
Admission: RE | Admit: 2023-04-20 | Discharge: 2023-04-20 | Disposition: A | Discharge status: Home / Self Care | Payer: Commercial Managed Care - PPO | Source: Ambulatory Visit | Attending: Oncology | Admitting: Oncology

## 2023-04-20 DIAGNOSIS — Z006 Encounter for examination for normal comparison and control in clinical research program: Secondary | ICD-10-CM | POA: Insufficient documentation

## 2023-04-27 ENCOUNTER — Inpatient Hospital Stay: Payer: Commercial Managed Care - PPO | Attending: Hematology

## 2023-04-27 VITALS — BP 120/71 | HR 81 | Temp 98.2°F | Resp 18 | Ht 67.0 in | Wt 213.5 lb

## 2023-04-27 DIAGNOSIS — Z5111 Encounter for antineoplastic chemotherapy: Secondary | ICD-10-CM | POA: Diagnosis present

## 2023-04-27 DIAGNOSIS — C50411 Malignant neoplasm of upper-outer quadrant of right female breast: Secondary | ICD-10-CM | POA: Insufficient documentation

## 2023-04-27 DIAGNOSIS — Z170 Estrogen receptor positive status [ER+]: Secondary | ICD-10-CM | POA: Diagnosis not present

## 2023-04-27 DIAGNOSIS — C7951 Secondary malignant neoplasm of bone: Secondary | ICD-10-CM | POA: Insufficient documentation

## 2023-04-27 MED ORDER — DIPHENHYDRAMINE HCL 25 MG PO CAPS
50.0000 mg | ORAL_CAPSULE | Freq: Once | ORAL | Status: AC
Start: 2023-04-27 — End: 2023-04-27
  Administered 2023-04-27: 50 mg via ORAL
  Filled 2023-04-27: qty 2

## 2023-04-27 MED ORDER — ACETAMINOPHEN 325 MG PO TABS
650.0000 mg | ORAL_TABLET | Freq: Once | ORAL | Status: AC
  Administered 2023-04-27: 650 mg via ORAL
  Filled 2023-04-27: qty 2

## 2023-04-27 MED ORDER — PERTUZ-TRASTUZ-HYALURON-ZZXF 60-60-2000 MG-MG-U/ML CHEMO ~~LOC~~ SOLN
10.0000 mL | Freq: Once | SUBCUTANEOUS | Status: AC
Start: 2023-04-27 — End: 2023-04-27
  Administered 2023-04-27: 10 mL via SUBCUTANEOUS
  Filled 2023-04-27: qty 10

## 2023-04-27 NOTE — Patient Instructions (Signed)
 [facility]  Discharge Instructions: Thank you for choosing [facility] to provide your oncology and hematology care.   If you have a lab appointment with the Cancer Center, please go directly to the Cancer Center and check in at the registration area.   Wear comfortable clothing and clothing appropriate for easy access to any Portacath or PICC line.   We strive to give you quality time with your provider. You may need to reschedule your appointment if you arrive late (15 or more minutes).  Arriving late affects you and other patients whose appointments are after yours.  Also, if you miss three or more appointments without notifying the office, you may be dismissed from the clinic at the provider's discretion.      For prescription refill requests, have your pharmacy contact our office and allow 72 hours for refills to be completed.    Today you received the following chemotherapy and/or immunotherapy agents Phesgo      To help prevent nausea and vomiting after your treatment, we encourage you to take your nausea medication as directed.  BELOW ARE SYMPTOMS THAT SHOULD BE REPORTED IMMEDIATELY: *FEVER GREATER THAN 100.4 F (38 C) OR HIGHER *CHILLS OR SWEATING *NAUSEA AND VOMITING THAT IS NOT CONTROLLED WITH YOUR NAUSEA MEDICATION *UNUSUAL SHORTNESS OF BREATH *UNUSUAL BRUISING OR BLEEDING *URINARY PROBLEMS (pain or burning when urinating, or frequent urination) *BOWEL PROBLEMS (unusual diarrhea, constipation, pain near the anus) TENDERNESS IN MOUTH AND THROAT WITH OR WITHOUT PRESENCE OF ULCERS (sore throat, sores in mouth, or a toothache) UNUSUAL RASH, SWELLING OR PAIN  UNUSUAL VAGINAL DISCHARGE OR ITCHING   Items with * indicate a potential emergency and should be followed up as soon as possible or go to the Emergency Department if any problems should occur.  Please show the CHEMOTHERAPY ALERT CARD or IMMUNOTHERAPY  ALERT CARD at check-in to the Emergency Department and triage nurse.  Should you have questions after your visit or need to cancel or reschedule your appointment, please contact [facility]  Dept: [phone]  and follow the prompts.  Office hours are 8:00 a.m. to 4:30 p.m. Monday - Friday. Please note that voicemails left after 4:00 p.m. may not be returned until the following business day.  We are closed weekends and major holidays. You have access to a nurse at all times for urgent questions. Please call the main number to the clinic Dept: [phone] and follow the prompts.   For any non-urgent questions, you may also contact your provider using MyChart. We now offer e-Visits for anyone 62 and older to request care online for non-urgent symptoms. For details visit [email or web address].   Also download the MyChart app! Go to the app store, search "MyChart", open the app, select [place], and log in with your MyChart username and password.

## 2023-04-30 LAB — GENECONNECT MOLECULAR SCREEN: Genetic Analysis Overall Interpretation: NEGATIVE

## 2023-05-16 NOTE — Assessment & Plan Note (Addendum)
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 Left axillary node biopsy was benign.  -01/01/20 Liver biopsy was negative but 01/12/20 Bone Biopsy was positive for metastatic carcinoma from primary breast cancer. ER/PR/HER2 were all negative, possibly related to decalcification. -She received THP 01/03/20 - 2/3/2 -on maintenance Herceptin and Phesgo injection every 3 weeks -repeated CT scan 11/09/2022 showed stable disease in bone and liver, no concerns for new lesion or evidence of progression.  -Echocardiogram done 02/28/2023 showed EF  55 - 60% with no changes from previous study done 08/2022.  -Per review of cardiology notes, next echocardiogram due in May 2025. -Cycle 14 given 04/06/2023, and cycle 15 given 04/27/2023, both given at [facility]. -05/18/2023 -today is cycle 16 day 1 -CT chest abdomen pelvis with contrast to be done in approximately 6 weeks. -Diagnostic mammogram due in June 2025. -Continue Herceptin and Perjeta every 3 weeks.   -- Follow-up with Dr. Mosetta Putt in 9 weeks with treatment.

## 2023-05-16 NOTE — Progress Notes (Signed)
Patient Care Team: Luciano Cutter, PA-C as PCP - General (Physician Assistant) Almond Lint, MD as Consulting Physician (General Surgery) Malachy Mood, MD as Consulting Physician (Hematology) Dorothy Puffer, MD as Consulting Physician (Radiation Oncology) Pershing Proud, RN as Oncology Nurse Navigator Donnelly Angelica, RN as Oncology Nurse Navigator  Clinic Day:  05/18/2023  Referring physician: Malachy Mood, MD  ASSESSMENT & PLAN:   Assessment & Plan: Malignant neoplasm of upper-outer quadrant of right breast in female, estrogen receptor positive (HCC) stage IV Z([id number]), with bone and possible liver mets,  ER+/PR+/HER2+, Grade II-III   --diagnosed in 11/2019 with large right breast mass measuring up to 11cm and right lymphadenopathy. 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 Left axillary node biopsy was benign.  -01/01/20 Liver biopsy was negative but 01/12/20 Bone Biopsy was positive for metastatic carcinoma from primary breast cancer. ER/PR/HER2 were all negative, possibly related to decalcification. -She received THP 01/03/20 - 2/3/2 -on maintenance Herceptin and Phesgo injection every 3 weeks -repeated CT scan 11/09/2022 showed stable disease in bone and liver, no concerns for new lesion or evidence of progression.  -Echocardiogram done 02/28/2023 showed EF  55 - 60% with no changes from previous study done 08/2022.  -Per review of cardiology notes, next echocardiogram due in May 2025. -Cycle 14 given 04/06/2023, and cycle 15 given 04/27/2023, both given at [facility]. -05/18/2023 -today is cycle 16 day 1 -CT chest abdomen pelvis with contrast to be done in approximately 6 weeks. -Diagnostic mammogram due in June 2025. -Continue Herceptin and Perjeta every 3 weeks.   -- Follow-up with Dr. Mosetta Putt in 9 weeks with treatment.    Plan: Labs reviewed  -CBC showing WBC 5.8; Hgb 13.1; Hct 39.9; Plt 301; Anc 2.4 -CMP - K 3.5;  glucose 118; BUN 11; Creatinine 0.9; eGFR > 60; Ca 9.2; LFTs normal.   -Ca 27.29 - pending -new CT CAP ordered today and to be scheduled in about 6 weeks  -set up for continued treatments every 3 weeks at [facility] -labs/flush, follow up with Dr. Mosetta Putt, and treatment in 9 weeks here.  -labs and patient condition are satisfactory for treatment. Proceed with Cycle 16 day 1 herceptin and perjeta    The patient understands the plans discussed today and is in agreement with them.  She knows to contact our office if she develops concerns prior to her next appointment.  I provided 25 minutes of face-to-face time during this encounter and > 50% was spent counseling as documented under my assessment and plan.    Carlean Jews, NP  [facility] - A DEPT OF [facility] [address] [place] [address] Dept: [phone] Dept Fax: [phone]   Orders Placed This Encounter  Procedures   CT CHEST ABDOMEN PELVIS W CONTRAST    Standing Status:   Future    Expected Date:   06/29/2023    Expiration Date:   05/17/2024    If indicated for the ordered procedure, I authorize the administration of contrast media per Radiology protocol:   Yes    Does the patient have a contrast media/X-ray dye allergy?:   No    Preferred imaging location?:   [facility]    If indicated for the ordered procedure, I authorize the administration of oral contrast media per Radiology protocol:   Yes   CBC with Differential (Cancer Center  Only)    Standing Status:   Future    Expected Date:   06/07/2023    Expiration Date:   06/06/2024   CMP (Cancer Center only)    Standing Status:   Future    Expected Date:   06/07/2023    Expiration Date:   06/06/2024   CA 27.29    Standing Status:   Future    Expected Date:   06/07/2023    Expiration Date:   06/06/2024   CBC with Differential (Cancer Center Only)    Standing Status:   Future     Expected Date:   06/28/2023    Expiration Date:   06/27/2024   CMP (Cancer Center only)    Standing Status:   Future    Expected Date:   06/28/2023    Expiration Date:   06/27/2024   CA 27.29    Standing Status:   Future    Expected Date:   06/28/2023    Expiration Date:   06/27/2024   CBC with Differential (Cancer Center Only)    Standing Status:   Future    Expected Date:   07/19/2023    Expiration Date:   07/18/2024   CMP (Cancer Center only)    Standing Status:   Future    Expected Date:   07/19/2023    Expiration Date:   07/18/2024   CA 27.29    Standing Status:   Future    Expected Date:   07/19/2023    Expiration Date:   07/18/2024   CBC with Differential (Cancer Center Only)    Standing Status:   Future    Expected Date:   08/09/2023    Expiration Date:   08/08/2024   CMP (Cancer Center only)    Standing Status:   Future    Expected Date:   08/09/2023    Expiration Date:   08/08/2024   CA 27.29    Standing Status:   Future    Expected Date:   08/09/2023    Expiration Date:   08/08/2024   CBC with Differential (Cancer Center Only)    Standing Status:   Future    Expected Date:   08/30/2023    Expiration Date:   08/29/2024   CMP (Cancer Center only)    Standing Status:   Future    Expected Date:   08/30/2023    Expiration Date:   08/29/2024   CA 27.29    Standing Status:   Future    Expected Date:   08/30/2023    Expiration Date:   08/29/2024   CBC with Differential (Cancer Center Only)    Standing Status:   Future    Expected Date:   09/20/2023    Expiration Date:   09/19/2024   CMP (Cancer Center only)    Standing Status:   Future    Expected Date:   09/20/2023    Expiration Date:   09/19/2024   CA 27.29    Standing Status:   Future    Expected Date:   09/20/2023    Expiration Date:   09/19/2024      CHIEF COMPLAINT:  CC: right breast cancer, estrogen receptor positive  Current Treatment: Trastuzumab and pertuzumab  INTERVAL HISTORY:  Carolyn Stewart is here today for repeat clinical  assessment.  She was last seen by myself on 03/16/2023.  Echocardiogram on 02/28/2023 showed normal EF 55 to 60% with no changes from prior study done 08/2022. Per review cardiology notes, echocardiogram  should be every 6 months unless clinical presentation changes. Today is cycle 16 day 1.  She received most recent infusion on 04/27/2023 at [facility].  The patient reports doing very well.  Only complaint is difficulty with weight loss. She denies chest pain, chest pressure, or shortness of breath. She denies headaches or visual disturbances. She denies abdominal pain, nausea, vomiting, or changes in bowel or bladder habits.   Denies fevers or chills. She denies pain. Her appetite is good. Her weight has been stable.  I have reviewed the past medical history, past surgical history, social history and family history with the patient and they are unchanged from previous note.  ALLERGIES:  has no known allergies.  MEDICATIONS:  Current Outpatient Medications  Medication Sig Dispense Refill   acetaminophen (TYLENOL) 500 MG tablet Take 1,000 mg by mouth every 6 (six) hours as needed for moderate pain or headache.     amLODipine (NORVASC) 10 MG tablet Take 2 tablets (20 mg total) by mouth daily. 60 tablet 11   Cholecalciferol (VITAMIN D3) 50 MCG (2000 UT) TABS Take 2,000 Units by mouth daily.      gabapentin (NEURONTIN) 100 MG capsule TAKE 2 CAPSULES BY MOUTH EVERY DAY AT BEDTIME 60 capsule 2   letrozole (FEMARA) 2.5 MG tablet Take 1 tablet (2.5 mg total) by mouth daily. 90 tablet 3   lidocaine-prilocaine (EMLA) cream Apply 1 Application topically as needed. 30 g 1   losartan (COZAAR) 100 MG tablet Take 1 tablet (100 mg total) by mouth daily. 90 tablet 3   metoprolol succinate (TOPROL-XL) 100 MG 24 hr tablet Take 1 tablet (100 mg total) by mouth daily. 30 tablet 11   omeprazole (PRILOSEC OTC) 20 MG tablet Take 20 mg by mouth daily.     potassium chloride (KLOR-CON M) 10 MEQ tablet Take 1  tablet (10 mEq total) by mouth daily. 90 tablet 1   No current facility-administered medications for this visit.   Facility-Administered Medications Ordered in Other Visits  Medication Dose Route Frequency Provider Last Rate Last Admin   sodium chloride flush (NS) 0.9 % injection 10 mL  10 mL Intravenous PRN Malachy Mood, MD       sodium chloride flush (NS) 0.9 % injection 10 mL  10 mL Intracatheter PRN Malachy Mood, MD   10 mL at 05/18/23 0950    HISTORY OF PRESENT ILLNESS:   Oncology History Overview Note  Cancer Staging Malignant neoplasm of upper-outer quadrant of right breast in female, estrogen receptor positive (HCC) Staging form: Breast, AJCC 8th Edition - Clinical stage from 11/25/2019: Stage IIA (cT3, cN1, cM0, G2, ER+, PR+, HER2+) - Signed by Malachy Mood, MD on 12/03/2019    Malignant neoplasm of upper-outer quadrant of right breast in female, estrogen receptor positive (HCC)  11/13/2019 Mammogram    Diagnostic Mammogram 11/13/19  IMPRESSION: -Highly suspicious mass and calcifications in the right breast centered between 9 and 12 o'clock spanning up to 11 cm.  -Multiple masses in the right axilla. One of the masses contains calcifications, denoted as mass number 2 measuring 9 x 11 by 10 mm. Several of these masses do not appear to represent definitive lymph nodes. Others may be small but abnormal appearing lymph nodes. Taken in total, a cluster of masses in the right axilla spans up to 3.7 cm.  -There are fibrocystic changes on the left. There are also some solid-appearing masses. A solid appearing mass at 10:30, 6 cm from the nipple measures 9 x 6  x 6 mm. An adjacent solid mass at 10 o'clock, 6 cm from the nipple measures 8 x 6 by 5 mm. Another solid-appearing mass at 11 o'clock, 1 cm from the nipple measures 5 x 4 by 5 mm. Fibrocystic changes are seen at 10 o'clock, 12 o'clock, and 4 o'clock. Another solid-appearing mass seen at 5 o'clock, 4 cm from the nipple measuring 6 x 3 x 4  mm. There is a single abnormal node in the left axilla with a cortex measuring 5 mm and restriction of the fatty hilum.   11/25/2019 Initial Biopsy   Diagnosis 1. Breast, right, needle core biopsy, upper outer - INVASIVE MAMMARY CARCINOMA - MAMMARY CARCINOMA IN SITU WITH CALCIFICATIONS AND NECROSIS - SEE COMMENT 2. Lymph node, needle/core biopsy, right axilla - INVASIVE MAMMARY CARCINOMA - SEE COMMENT 3. Lymph node, needle/core biopsy, left axilla - BENIGN LYMPH NODE - NO CARCINOMA IDENTIFIED Microscopic Comment 1. The biopsy material shows an infiltrative proliferation of cells arranged linearly and in small clusters. Based on the biopsy, the carcinoma appears Nottingham grade 2 of 3 and measures 1.2 cm in greatest linear extent. E-cadherin and prognostic markers (ER/PR/ki-67/HER2)are pending and will be reported in an addendum. Dr. Berneice Heinrich reviewed the case and agrees with the above diagnosis. These results were called to [facility] on November 26, 2019. 2. Definitive nodal tissue is not identified. This biopsy has a slightly different architecture morphology than what is seen in part 1. The carcinoma measures 0.7 cm in greatest dimension. E-cadherin and prognostic markers (ER, PR, Ki-67 and HER-2) are pending and will be reported in an addendum.   11/25/2019 Receptors her2   1. PROGNOSTIC INDICATORS Results: IMMUNOHISTOCHEMICAL AND MORPHOMETRIC ANALYSIS PERFORMED MANUALLY The tumor cells are POSITIVE for Her2 (3+). Estrogen Receptor: 90%, POSITIVE, STRONG STAINING INTENSITY Progesterone Receptor: 20%, POSITIVE, STRONG STAINING INTENSITY Proliferation Marker Ki67: 30%  2. PROGNOSTIC INDICATORS Results: IMMUNOHISTOCHEMICAL AND MORPHOMETRIC ANALYSIS PERFORMED MANUALLY The tumor cells are POSITIVE for Her2 (3+). Estrogen Receptor: 90%, POSITIVE, STRONG STAINING INTENSITY Progesterone Receptor: 25%, POSITIVE, STRONG STAINING INTENSITY Proliferation Marker Ki67:  30%   11/25/2019 Cancer Staging   Staging form: Breast, AJCC 8th Edition - Clinical stage from 11/25/2019: Stage IIA (cT3, cN1, cM0, G2, ER+, PR+, HER2+) - Signed by Malachy Mood, MD on 12/03/2019   12/01/2019 Initial Diagnosis   Malignant neoplasm of upper-outer quadrant of right breast in female, estrogen receptor positive (HCC)   12/10/2019 Imaging   Bone Scan  IMPRESSION: Findings concerning for bony metastatic disease in the left acetabulum region, L3 vertebral body region, and anterolateral right tenth rib.   Increased uptake in several large joints likely is of arthropathic etiology.   12/11/2019 Imaging   CT CAP w contrast  IMPRESSION: Prominent glandular tissue in the central right breast with nipple retraction, likely corresponding to the patient's known right breast neoplasm.   Prominent right axillary nodes measuring up to 8 mm short axis, suspicious for nodal metastases in this clinical context.   3 mm subpleural pulmonary nodule in the right lower lobe, likely benign. However, follow-up CT chest is suggested in 3-6 months.   Multiple hepatic lesions, including a dominant 2.4 cm lesion in segment 6, which is considered indeterminate. Metastasis is not excluded. Consider MRI abdomen with/without contrast for further characterization.   11 mm sclerotic lesion along the left posterior aspect of the T7 vertebral body raises concern for isolated osseous metastasis. Correlate with priors if available. Benign vertebral hemangioma at T11.   12/12/2019  Breast MRI   IMPRESSION: 1. The biopsy proven malignancy in the right breast involves all 4 quadrants and spans up to 9 cm by MRI. There is enhancement within the skin at the level of the nipple.   2. Multiple matted masses are seen in the right axilla, either abnormal metastatic lymph nodes or a combination of masses and metastatic lymph nodes. One of these has been previously biopsied showing invasive mammary carcinoma  without definitive nodal tissue.   3.  There is a 1.5 cm Rotter's lymph node on the right.   4. There is markedly abnormal enhancement and edema in the right pectoralis major muscle spanning 6-7 cm, suspicious for metastatic disease.   5. There is diffuse nodular enhancement throughout the left breast, which may correspond with the solid-appearing masses seen on ultrasound. These all have a similar appearance on MRI.   6.  No findings of left axillary lymphadenopathy.   12/15/2019 Procedure   PAC placed    12/24/2019 Imaging   MRI abdomen  IMPRESSION: 1. Multifocal enhancing bone lesions are noted within the lumbar spine, and bony pelvis compatible with metastatic disease. 2. Four, small peripherally enhancing cystic lesions are noted within the liver worrisome for metastatic disease. 3. 2 enhancing liver lesions are also noted with signal and enhancement characteristics consistent with benign liver hemangioma. 4. Well-circumscribed, enhancing T2 and T1 hypointense structure within the right adnexal region is indeterminate. It is unclear whether not this is arising from the right ovary, broad ligament or right side of uterine fundus. Differential considerations include fibrothecoma, versus pedunculated uterine leiomyoma. Metastatic disease is considered less favored. Attention on follow-up imaging is recommended.     01/03/2020 - 05/27/2020 Chemotherapy   First line THP q3weeks starting 01/03/20-05/27/20   01/05/2020 Imaging   US Abdomen  IMPRESSION: 1. Suspect hematoma in the medial right lobe of the liver in the area of previous biopsy. Note that recent MR does not show lesion in this area. This area has ill-defined margins and measures 3.9 x 4.0 x 3.5 cm. No perihepatic fluid.   2. Probable hemangiomas elsewhere in the right lobe of the liver. Note that several smaller lesions seen on recent MR are not appreciable by ultrasound.   3.  Study otherwise unremarkable.    01/12/2020 Pathology Results   FINAL MICROSCOPIC DIAGNOSIS:   A. BONE, SCLEROTIC LESION, LEFT ANTERIOR ILIAC SPINE, BIOPSY:  - Metastatic carcinoma, consistent with patient's clinical history of  primary breast carcinoma  - See comment   COMMENT:  Immunohistochemical stains show that the tumor cells are positive for  CK7 and GATA3; and negative for CK20, consistent with above  interpretation.   PROGNOSTIC INDICATOR RESULTS:  The tumor cells are NEGATIVE for Her2 (0); see comment  Estrogen Receptor: NEGATIVE; see comment  Progesterone Receptor: NEGATIVE; see comment    03/23/2020 Imaging   CT CAP IMPRESSION: 1. Right lower lobe perifissural nodule is slightly more conspicuous on today's study. Close attention on follow-up recommended. 2. Interval progression of segment IV liver lesion, concerning for metastatic progression. The remaining visualized liver lesions are stable to smaller in the interval. 3. Interval evolution in appearance of thoracolumbar spinal lesions and left iliac crest lesion, potentially reflecting response to therapy/healing. No definite new osseous lesions on today's study. 4. New small fluid collection right cul-de-sac. 5. Probable fibroid change in the uterus including exophytic right fundal lesion. 6. Aortic Atherosclerosis (ICD10-I70.0).   03/31/2020 Imaging   MR ABD IMPRESSION: 1. Enlarging hepatic lesion in hepatic  subsegment IV suspicious for hepatic metastasis. The it is uncertain whether the change in the short interval is due to technical factors with different modalities or true enlargement. 2. Lesion in the posterior RIGHT hepatic lobe with imaging features that are atypical for hemangioma but stability and signs of enhancement on later phase raising this question. Another more classic appearing may angioma seen inferior to this location in hepatic subsegment VI. Attention on follow-up. 3. Heterogeneous enhancement throughout the spine  particularly at L3 as exhibited on the prior study with very patchy marrow signal remains suspicious for bony metastatic lesions in this patient with known bone metastases.   04/01/2020 Imaging   Bone scan IMPRESSION: Good response to therapy with resolution of sites of uptake seen previously at the cervical spine and posterior RIGHT tenth rib as well as a diminished degree of uptake at remaining previously identified osseous metastatic foci.   04/28/2020 PET scan   IMPRESSION: 1. No hypermetabolic lesions to suggest active metastatic disease involving the liver or osseous structures. 2. Stable small low-attenuation liver lesions, likely treated disease.   06/18/2020 -  Chemotherapy   Given her excellent response, I switched to maintenance therapy with Herceptin, Perjeta (Phesgo) q3weeks and Letrozole.   06/18/2020 -  Chemotherapy   Zometa starting 06/18/20   06/18/2020 -  Anti-estrogen oral therapy   Letrozole 2.5mg  once dialy.    06/18/2020 - 06/16/2022 Chemotherapy   Patient is on Treatment Plan : BREAST Trastuzumab + Pertuzumab q21d     09/03/2020 Imaging   Bone Scan IMPRESSION: 1. Significant interval improvement compared to previous studies. Only mild uptake remains in the pelvis as above. There has been significant interval improvement over time.  CT C/A/P IMPRESSION: 1. Hypodense liver lesions are slightly decreased in size compared to prior examination, consistent with treatment response of hepatic metastatic disease. 2. Unchanged sclerotic lesion of the anterior left iliac crest. Unchanged lytic superior endplate deformity of L3, which remains equivocal for metastatic lesion versus mechanical Schmorl deformity. No CT evidence of new osseous metastatic disease. 3. No evidence of new metastatic disease in the chest, abdomen, or pelvis. 4. Coronary artery disease.   05/05/2022 Imaging    IMPRESSION: 1. Interval decreased size of the largest hepatic metastatic  lesion with stable size of the additional subcentimeter bilobar hepatic lesions. Continued decrease in size of the bilobar hepatic metastases. 2. Similar appearance of the osseous metatatic disease. No new aggressive lytic or blastic lesion of bone. 3. No evidence of new or progressive metastatic disease in the chest, abdomen or pelvis.     05/05/2022 Imaging    IMPRESSION: Faint residual radiotracer uptake in the left hemipelvis.   New focus of mild abnormal radiotracer uptake in the right lateral seventh rib without discrete CT correlate on same day examination, nonspecific suggest correlation with history of interval trauma.     07/07/2022 -  Chemotherapy   Patient is on Treatment Plan : BREAST Trastuzumab + Pertuzumab q21d     02/28/2023 Echocardiogram   IMPRESSIONS   1. Left ventricular ejection fraction, by estimation, is 55 to 60%. The left ventricle has normal function. The left ventricle has no regional wall motion abnormalities. Left ventricular diastolic parameters are consistent with Grade I diastolic  dysfunction (impaired relaxation). The average left ventricular global longitudinal strain is -17.1 %. The global longitudinal strain is normal.   2. Right ventricular systolic function is normal. The right ventricular size is normal. There is normal pulmonary artery systolic pressure. The estimated right ventricular  systolic pressure is 28.8 mmHg.   3. The mitral valve is normal in structure. No evidence of mitral valve regurgitation. No evidence of mitral stenosis.   4. The aortic valve is tricuspid. Aortic valve regurgitation is trivial. No aortic stenosis is present.   5. The inferior vena cava is normal in size with greater than 50% respiratory variability, suggesting right atrial pressure of 3 mmHg.  Comparison(s): No significant change from prior study. Prior GLS: -18.0.          REVIEW OF SYSTEMS:   Constitutional: Denies fevers, chills or abnormal weight  loss Eyes: Denies blurriness of vision Ears, nose, mouth, throat, and face: Denies mucositis or sore throat Respiratory: Denies cough, dyspnea or wheezes Cardiovascular: Denies palpitation, chest discomfort or lower extremity swelling Gastrointestinal:  Denies nausea, heartburn or change in bowel habits Skin: Denies abnormal skin rashes Lymphatics: Denies new lymphadenopathy or easy bruising Neurological:Denies numbness, tingling or new weaknesses Behavioral/Psych: Mood is stable, no new changes  All other systems were reviewed with the patient and are negative.   VITALS:   Today's Vitals   05/18/23 0819 05/18/23 0827  BP: 110/61   Pulse: 91   Resp: 16   Temp: 97.6 F (36.4 C)   TempSrc: Temporal   SpO2: 100%   Weight: 214 lb (97.1 kg)   Height: 5\' 7"  (1.702 m)   PainSc:  0-No pain   Body mass index is 33.52 kg/m.   Wt Readings from Last 3 Encounters:  05/18/23 214 lb (97.1 kg)  04/27/23 213 lb 8 oz (96.8 kg)  04/06/23 212 lb 7 oz (96.4 kg)    Body mass index is 33.52 kg/m.  Performance status (ECOG): 0 - Asymptomatic  PHYSICAL EXAM:   GENERAL:alert, no distress and comfortable SKIN: skin color, texture, turgor are normal, no rashes or significant lesions EYES: normal, Conjunctiva are pink and non-injected, sclera clear OROPHARYNX:no exudate, no erythema and lips, buccal mucosa, and tongue normal  NECK: supple, thyroid normal size, non-tender, without nodularity LYMPH:  no palpable lymphadenopathy in the cervical, axillary or inguinal LUNGS: clear to auscultation and percussion with normal breathing effort HEART: regular rate & rhythm and no murmurs and no lower extremity edema ABDOMEN:abdomen soft, non-tender and normal bowel sounds Musculoskeletal:no cyanosis of digits and no clubbing  NEURO: alert & oriented x 3 with fluent speech, no focal motor/sensory deficits BREAST: Bilateral nipple inversion which is baseline.  No nipple discharge S/P right lateral  lumpectomy scar which is well-healed.  No lumps or masses noted in the right breast.  There is no axillary lymphadenopathy on the right side.  Left breast is without lumps or masses.  No nipple discharge.  There is no axillary lymphadenopathy on the left side.  LABORATORY DATA:  I have reviewed the data as listed    Component Value Date/Time   NA 142 05/18/2023 0756   K 3.5 05/18/2023 0756   CL 110 05/18/2023 0756   CO2 26 05/18/2023 0756   GLUCOSE 118 (H) 05/18/2023 0756   BUN 11 05/18/2023 0756   CREATININE 0.90 05/18/2023 0756   CALCIUM 9.2 05/18/2023 0756   PROT 7.0 05/18/2023 0756   ALBUMIN 4.2 05/18/2023 0756   AST 14 (L) 05/18/2023 0756   ALT 8 05/18/2023 0756   ALKPHOS 84 05/18/2023 0756   BILITOT 0.4 05/18/2023 0756   GFRNONAA >60 05/18/2023 0756   GFRAA >60 01/22/2020 0824     Lab Results  Component Value Date   WBC 5.8 05/18/2023  NEUTROABS 2.4 05/18/2023   HGB 13.1 05/18/2023   HCT 39.9 05/18/2023   MCV 90.9 05/18/2023   PLT 301 05/18/2023

## 2023-05-18 ENCOUNTER — Inpatient Hospital Stay: Payer: Commercial Managed Care - PPO

## 2023-05-18 ENCOUNTER — Encounter: Payer: Self-pay | Admitting: Hematology

## 2023-05-18 ENCOUNTER — Inpatient Hospital Stay: Payer: Commercial Managed Care - PPO | Admitting: Nurse Practitioner

## 2023-05-18 ENCOUNTER — Encounter: Payer: Self-pay | Admitting: Nurse Practitioner

## 2023-05-18 VITALS — BP 110/61 | HR 91 | Temp 97.6°F | Resp 16 | Ht 67.0 in | Wt 214.0 lb

## 2023-05-18 DIAGNOSIS — C50411 Malignant neoplasm of upper-outer quadrant of right female breast: Secondary | ICD-10-CM

## 2023-05-18 DIAGNOSIS — Z170 Estrogen receptor positive status [ER+]: Secondary | ICD-10-CM

## 2023-05-18 DIAGNOSIS — Z5111 Encounter for antineoplastic chemotherapy: Secondary | ICD-10-CM | POA: Diagnosis not present

## 2023-05-18 LAB — CBC WITH DIFFERENTIAL (CANCER CENTER ONLY)
Abs Immature Granulocytes: 0.01 10*3/uL (ref 0.00–0.07)
Basophils Absolute: 0 10*3/uL (ref 0.0–0.1)
Basophils Relative: 0 %
Eosinophils Absolute: 0.2 10*3/uL (ref 0.0–0.5)
Eosinophils Relative: 3 %
HCT: 39.9 % (ref 36.0–46.0)
Hemoglobin: 13.1 g/dL (ref 12.0–15.0)
Immature Granulocytes: 0 %
Lymphocytes Relative: 46 %
Lymphs Abs: 2.7 10*3/uL (ref 0.7–4.0)
MCH: 29.8 pg (ref 26.0–34.0)
MCHC: 32.8 g/dL (ref 30.0–36.0)
MCV: 90.9 fL (ref 80.0–100.0)
Monocytes Absolute: 0.5 10*3/uL (ref 0.1–1.0)
Monocytes Relative: 9 %
Neutro Abs: 2.4 10*3/uL (ref 1.7–7.7)
Neutrophils Relative %: 42 %
Platelet Count: 301 10*3/uL (ref 150–400)
RBC: 4.39 MIL/uL (ref 3.87–5.11)
RDW: 13.6 % (ref 11.5–15.5)
WBC Count: 5.8 10*3/uL (ref 4.0–10.5)
nRBC: 0 % (ref 0.0–0.2)

## 2023-05-18 LAB — CMP (CANCER CENTER ONLY)
ALT: 8 U/L (ref 0–44)
AST: 14 U/L — ABNORMAL LOW (ref 15–41)
Albumin: 4.2 g/dL (ref 3.5–5.0)
Alkaline Phosphatase: 84 U/L (ref 38–126)
Anion gap: 6 (ref 5–15)
BUN: 11 mg/dL (ref 8–23)
CO2: 26 mmol/L (ref 22–32)
Calcium: 9.2 mg/dL (ref 8.9–10.3)
Chloride: 110 mmol/L (ref 98–111)
Creatinine: 0.9 mg/dL (ref 0.44–1.00)
GFR, Estimated: >60 mL/min (ref >60)
Glucose, Bld: 118 mg/dL — ABNORMAL HIGH (ref 70–99)
Potassium: 3.5 mmol/L (ref 3.5–5.1)
Sodium: 142 mmol/L (ref 135–145)
Total Bilirubin: 0.4 mg/dL (ref 0.0–1.2)
Total Protein: 7 g/dL (ref 6.5–8.1)

## 2023-05-18 MED ORDER — ZOLEDRONIC ACID 4 MG/100ML IV SOLN
4.0000 mg | Freq: Once | INTRAVENOUS | Status: AC
  Administered 2023-05-18: 4 mg via INTRAVENOUS
  Filled 2023-05-18: qty 100

## 2023-05-18 MED ORDER — PERTUZ-TRASTUZ-HYALURON-ZZXF 60-60-2000 MG-MG-U/ML CHEMO ~~LOC~~ SOLN
10.0000 mL | Freq: Once | SUBCUTANEOUS | Status: AC
  Administered 2023-05-18: 10 mL via SUBCUTANEOUS
  Filled 2023-05-18: qty 10

## 2023-05-18 MED ORDER — SODIUM CHLORIDE 0.9% FLUSH
10.0000 mL | INTRAVENOUS | Status: DC | PRN
  Administered 2023-05-18: 10 mL

## 2023-05-18 MED ORDER — DIPHENHYDRAMINE HCL 25 MG PO CAPS
50.0000 mg | ORAL_CAPSULE | Freq: Once | ORAL | Status: AC
  Administered 2023-05-18: 50 mg via ORAL
  Filled 2023-05-18: qty 2

## 2023-05-18 MED ORDER — ACETAMINOPHEN 325 MG PO TABS
650.0000 mg | ORAL_TABLET | Freq: Once | ORAL | Status: AC
  Administered 2023-05-18: 650 mg via ORAL
  Filled 2023-05-18: qty 2

## 2023-05-18 MED ORDER — SODIUM CHLORIDE 0.9 % IV SOLN: Freq: Once | INTRAVENOUS | Status: AC

## 2023-05-18 MED ORDER — HEPARIN SOD (PORK) LOCK FLUSH 100 UNIT/ML IV SOLN
500.0000 [IU] | Freq: Once | INTRAVENOUS | Status: AC | PRN
  Administered 2023-05-18: 500 [IU]

## 2023-05-18 MED ORDER — SODIUM CHLORIDE 0.9% FLUSH
10.0000 mL | Freq: Once | INTRAVENOUS | Status: AC
  Administered 2023-05-18: 10 mL

## 2023-05-18 NOTE — Patient Instructions (Signed)
[facility] - A DEPT OF [facility]  Discharge Instructions: Thank you for choosing [facility] to provide your oncology and hematology care.   If you have a lab appointment with the Cancer Center, please go directly to the Cancer Center and check in at the registration area.   Wear comfortable clothing and clothing appropriate for easy access to any Portacath or PICC line.   We strive to give you quality time with your provider. You may need to reschedule your appointment if you arrive late (15 or more minutes).  Arriving late affects you and other patients whose appointments are after yours.  Also, if you miss three or more appointments without notifying the office, you may be dismissed from the clinic at the provider's discretion.      For prescription refill requests, have your pharmacy contact our office and allow 72 hours for refills to be completed.    Today you received the following chemotherapy and/or immunotherapy agents: Phesgo      To help prevent nausea and vomiting after your treatment, we encourage you to take your nausea medication as directed.  BELOW ARE SYMPTOMS THAT SHOULD BE REPORTED IMMEDIATELY: *FEVER GREATER THAN 100.4 F (38 C) OR HIGHER *CHILLS OR SWEATING *NAUSEA AND VOMITING THAT IS NOT CONTROLLED WITH YOUR NAUSEA MEDICATION *UNUSUAL SHORTNESS OF BREATH *UNUSUAL BRUISING OR BLEEDING *URINARY PROBLEMS (pain or burning when urinating, or frequent urination) *BOWEL PROBLEMS (unusual diarrhea, constipation, pain near the anus) TENDERNESS IN MOUTH AND THROAT WITH OR WITHOUT PRESENCE OF ULCERS (sore throat, sores in mouth, or a toothache) UNUSUAL RASH, SWELLING OR PAIN  UNUSUAL VAGINAL DISCHARGE OR ITCHING   Items with * indicate a potential emergency and should be followed up as soon as possible or go to the Emergency Department if any problems should occur.  Please show the CHEMOTHERAPY ALERT CARD or IMMUNOTHERAPY  ALERT CARD at check-in to the Emergency Department and triage nurse.  Should you have questions after your visit or need to cancel or reschedule your appointment, please contact [facility] - A DEPT OF [facility]  Dept: [phone]  and follow the prompts.  Office hours are 8:00 a.m. to 4:30 p.m. Monday - Friday. Please note that voicemails left after 4:00 p.m. may not be returned until the following business day.  We are closed weekends and major holidays. You have access to a nurse at all times for urgent questions. Please call the main number to the clinic Dept: [phone] and follow the prompts.   For any non-urgent questions, you may also contact your provider using MyChart. We now offer e-Visits for anyone 85 and older to request care online for non-urgent symptoms. For details visit [email or web address].   Also download the MyChart app! Go to the app store, search "MyChart", open the app, select [facility], and log in with your MyChart username and password.  Zoledronic Acid Injection (Cancer) What is this medication? ZOLEDRONIC ACID (ZOE le dron ik AS id) treats high calcium levels in the blood caused by cancer. It may also be used with chemotherapy to treat weakened bones caused by cancer. It works by slowing down the release of calcium from bones. This lowers calcium levels in your blood. It also makes your bones stronger and less likely to break (fracture). It belongs to a group of medications called bisphosphonates. This medicine may be used for other purposes; ask your health care provider  or pharmacist if you have questions. COMMON BRAND NAME(S): Zometa, Zometa Powder What should I tell my care team before I take this medication? They need to know if you have any of these conditions: Dehydration Dental disease Kidney disease Liver disease Low levels of calcium in the blood Lung or breathing disease, such as asthma Receiving steroids, such as  dexamethasone or prednisone An unusual or allergic reaction to zoledronic acid, other medications, foods, dyes, or preservatives Pregnant or trying to get pregnant Breast-feeding How should I use this medication? This medication is injected into a vein. It is given by your care team in a hospital or clinic setting. Talk to your care team about the use of this medication in children. Special care may be needed. Overdosage: If you think you have taken too much of this medicine contact a poison control center or emergency room at once. NOTE: This medicine is only for you. Do not share this medicine with others. What if I miss a dose? Keep appointments for follow-up doses. It is important not to miss your dose. Call your care team if you are unable to keep an appointment. What may interact with this medication? Certain antibiotics given by injection Diuretics, such as bumetanide, furosemide NSAIDs, medications for pain and inflammation, such as ibuprofen or naproxen Teriparatide Thalidomide This list may not describe all possible interactions. Give your health care provider a list of all the medicines, herbs, non-prescription drugs, or dietary supplements you use. Also tell them if you smoke, drink alcohol, or use illegal drugs. Some items may interact with your medicine. What should I watch for while using this medication? Visit your care team for regular checks on your progress. It may be some time before you see the benefit from this medication. Some people who take this medication have severe bone, joint, or muscle pain. This medication may also increase your risk for jaw problems or a broken thigh bone. Tell your care team right away if you have severe pain in your jaw, bones, joints, or muscles. Tell you care team if you have any pain that does not go away or that gets worse. Tell your dentist and dental surgeon that you are taking this medication. You should not have major dental surgery  while on this medication. See your dentist to have a dental exam and fix any dental problems before starting this medication. Take good care of your teeth while on this medication. Make sure you see your dentist for regular follow-up appointments. You should make sure you get enough calcium and vitamin D while you are taking this medication. Discuss the foods you eat and the vitamins you take with your care team. Check with your care team if you have severe diarrhea, nausea, and vomiting, or if you sweat a lot. The loss of too much body fluid may make it dangerous for you to take this medication. You may need bloodwork while taking this medication. Talk to your care team if you wish to become pregnant or think you might be pregnant. This medication can cause serious birth defects. What side effects may I notice from receiving this medication? Side effects that you should report to your care team as soon as possible: Allergic reactions--skin rash, itching, hives, swelling of the face, lips, tongue, or throat Kidney injury--decrease in the amount of urine, swelling of the ankles, hands, or feet Low calcium level--muscle pain or cramps, confusion, tingling, or numbness in the hands or feet Osteonecrosis of the jaw--pain, swelling, or redness  in the mouth, numbness of the jaw, poor healing after dental work, unusual discharge from the mouth, visible bones in the mouth Severe bone, joint, or muscle pain Side effects that usually do not require medical attention (report to your care team if they continue or are bothersome): Constipation Fatigue Fever Loss of appetite Nausea Stomach pain This list may not describe all possible side effects. Call your doctor for medical advice about side effects. You may report side effects to FDA at 1-800-FDA-1088. Where should I keep my medication? This medication is given in a hospital or clinic. It will not be stored at home. NOTE: This sheet is a summary. It may  not cover all possible information. If you have questions about this medicine, talk to your doctor, pharmacist, or health care provider.  2024 Elsevier/Gold Standard (2021-06-03 00:00:00)

## 2023-05-18 NOTE — Progress Notes (Signed)
Pt remained for 15 min observation following Phesgo injection.  Tolerated treatment well without incident.  Ambulated to lobby.

## 2023-05-19 LAB — CANCER ANTIGEN 27.29: CA 27.29: 42.2 U/mL — ABNORMAL HIGH (ref 0.0–38.6)

## 2023-05-21 ENCOUNTER — Other Ambulatory Visit: Payer: Self-pay

## 2023-05-23 ENCOUNTER — Other Ambulatory Visit: Payer: Self-pay

## 2023-05-27 ENCOUNTER — Other Ambulatory Visit: Payer: Self-pay

## 2023-05-28 ENCOUNTER — Other Ambulatory Visit: Payer: Self-pay | Admitting: Hematology

## 2023-06-04 ENCOUNTER — Other Ambulatory Visit: Payer: Self-pay

## 2023-06-07 ENCOUNTER — Ambulatory Visit: Payer: Commercial Managed Care - PPO

## 2023-06-08 ENCOUNTER — Inpatient Hospital Stay: Payer: Commercial Managed Care - PPO | Attending: Hematology

## 2023-06-08 VITALS — BP 119/76 | HR 83 | Temp 98.3°F | Resp 18 | Wt 211.9 lb

## 2023-06-08 DIAGNOSIS — Z1731 Human epidermal growth factor receptor 2 positive status: Secondary | ICD-10-CM | POA: Diagnosis not present

## 2023-06-08 DIAGNOSIS — Z170 Estrogen receptor positive status [ER+]: Secondary | ICD-10-CM | POA: Insufficient documentation

## 2023-06-08 DIAGNOSIS — Z1721 Progesterone receptor positive status: Secondary | ICD-10-CM | POA: Insufficient documentation

## 2023-06-08 DIAGNOSIS — C7951 Secondary malignant neoplasm of bone: Secondary | ICD-10-CM | POA: Diagnosis not present

## 2023-06-08 DIAGNOSIS — Z5111 Encounter for antineoplastic chemotherapy: Secondary | ICD-10-CM | POA: Diagnosis present

## 2023-06-08 DIAGNOSIS — C50411 Malignant neoplasm of upper-outer quadrant of right female breast: Secondary | ICD-10-CM | POA: Insufficient documentation

## 2023-06-08 MED ORDER — DIPHENHYDRAMINE HCL 25 MG PO CAPS
50.0000 mg | ORAL_CAPSULE | Freq: Once | ORAL | Status: AC
  Administered 2023-06-08: 50 mg via ORAL
  Filled 2023-06-08: qty 2

## 2023-06-08 MED ORDER — ACETAMINOPHEN 325 MG PO TABS
650.0000 mg | ORAL_TABLET | Freq: Once | ORAL | Status: AC
  Administered 2023-06-08: 650 mg via ORAL
  Filled 2023-06-08: qty 2

## 2023-06-08 MED ORDER — PERTUZ-TRASTUZ-HYALURON-ZZXF 60-60-2000 MG-MG-U/ML CHEMO ~~LOC~~ SOLN
10.0000 mL | Freq: Once | SUBCUTANEOUS | Status: AC
  Administered 2023-06-08: 10 mL via SUBCUTANEOUS
  Filled 2023-06-08: qty 10

## 2023-06-08 NOTE — Patient Instructions (Signed)
[facility]  Discharge Instructions: Thank you for choosing [facility] to provide your oncology and hematology care.   If you have a lab appointment with the Cancer Center, please go directly to the Cancer Center and check in at the registration area.   Wear comfortable clothing and clothing appropriate for easy access to any Portacath or PICC line.   We strive to give you quality time with your provider. You may need to reschedule your appointment if you arrive late (15 or more minutes).  Arriving late affects you and other patients whose appointments are after yours.  Also, if you miss three or more appointments without notifying the office, you may be dismissed from the clinic at the provider's discretion.      For prescription refill requests, have your pharmacy contact our office and allow 72 hours for refills to be completed.    Today you received the following chemotherapy and/or immunotherapy agents: Phesgo      To help prevent nausea and vomiting after your treatment, we encourage you to take your nausea medication as directed.  BELOW ARE SYMPTOMS THAT SHOULD BE REPORTED IMMEDIATELY: *FEVER GREATER THAN 100.4 F (38 C) OR HIGHER *CHILLS OR SWEATING *NAUSEA AND VOMITING THAT IS NOT CONTROLLED WITH YOUR NAUSEA MEDICATION *UNUSUAL SHORTNESS OF BREATH *UNUSUAL BRUISING OR BLEEDING *URINARY PROBLEMS (pain or burning when urinating, or frequent urination) *BOWEL PROBLEMS (unusual diarrhea, constipation, pain near the anus) TENDERNESS IN MOUTH AND THROAT WITH OR WITHOUT PRESENCE OF ULCERS (sore throat, sores in mouth, or a toothache) UNUSUAL RASH, SWELLING OR PAIN  UNUSUAL VAGINAL DISCHARGE OR ITCHING   Items with * indicate a potential emergency and should be followed up as soon as possible or go to the Emergency Department if any problems should occur.  Please show the CHEMOTHERAPY ALERT CARD or IMMUNOTHERAPY  ALERT CARD at check-in to the Emergency Department and triage nurse.  Should you have questions after your visit or need to cancel or reschedule your appointment, please contact [facility] - [facility]  Dept: [phone]  and follow the prompts.  Office hours are 8:00 a.m. to 4:30 p.m. Monday - Friday. Please note that voicemails left after 4:00 p.m. may not be returned until the following business day.  We are closed weekends and major holidays. You have access to a nurse at all times for urgent questions. Please call the main number to the clinic Dept: [phone] and follow the prompts.   For any non-urgent questions, you may also contact your provider using MyChart. We now offer e-Visits for anyone 15 and older to request care online for non-urgent symptoms. For details visit [email or web address].   Also download the MyChart app! Go to the app store, search "MyChart", open the app, select [facility], and log in with your MyChart username and password.  Zoledronic Acid Injection (Cancer) What is this medication? ZOLEDRONIC ACID (ZOE le dron ik AS id) treats high calcium levels in the blood caused by cancer. It may also be used with chemotherapy to treat weakened bones caused by cancer. It works by slowing down the release of calcium from bones. This lowers calcium levels in your blood. It also makes your bones stronger and less likely to break (fracture). It belongs to a group of medications called bisphosphonates. This medicine may be used for other purposes; ask your health care provider or pharmacist if you  have questions. COMMON BRAND NAME(S): Zometa, Zometa Powder What should I tell my care team before I take this medication? They need to know if you have any of these conditions: Dehydration Dental disease Kidney disease Liver disease Low levels of calcium in the blood Lung or breathing disease, such as asthma Receiving steroids, such as  dexamethasone or prednisone An unusual or allergic reaction to zoledronic acid, other medications, foods, dyes, or preservatives Pregnant or trying to get pregnant Breast-feeding How should I use this medication? This medication is injected into a vein. It is given by your care team in a hospital or clinic setting. Talk to your care team about the use of this medication in children. Special care may be needed. Overdosage: If you think you have taken too much of this medicine contact a poison control center or emergency room at once. NOTE: This medicine is only for you. Do not share this medicine with others. What if I miss a dose? Keep appointments for follow-up doses. It is important not to miss your dose. Call your care team if you are unable to keep an appointment. What may interact with this medication? Certain antibiotics given by injection Diuretics, such as bumetanide, furosemide NSAIDs, medications for pain and inflammation, such as ibuprofen or naproxen Teriparatide Thalidomide This list may not describe all possible interactions. Give your health care provider a list of all the medicines, herbs, non-prescription drugs, or dietary supplements you use. Also tell them if you smoke, drink alcohol, or use illegal drugs. Some items may interact with your medicine. What should I watch for while using this medication? Visit your care team for regular checks on your progress. It may be some time before you see the benefit from this medication. Some people who take this medication have severe bone, joint, or muscle pain. This medication may also increase your risk for jaw problems or a broken thigh bone. Tell your care team right away if you have severe pain in your jaw, bones, joints, or muscles. Tell you care team if you have any pain that does not go away or that gets worse. Tell your dentist and dental surgeon that you are taking this medication. You should not have major dental surgery  while on this medication. See your dentist to have a dental exam and fix any dental problems before starting this medication. Take good care of your teeth while on this medication. Make sure you see your dentist for regular follow-up appointments. You should make sure you get enough calcium and vitamin D while you are taking this medication. Discuss the foods you eat and the vitamins you take with your care team. Check with your care team if you have severe diarrhea, nausea, and vomiting, or if you sweat a lot. The loss of too much body fluid may make it dangerous for you to take this medication. You may need bloodwork while taking this medication. Talk to your care team if you wish to become pregnant or think you might be pregnant. This medication can cause serious birth defects. What side effects may I notice from receiving this medication? Side effects that you should report to your care team as soon as possible: Allergic reactions--skin rash, itching, hives, swelling of the face, lips, tongue, or throat Kidney injury--decrease in the amount of urine, swelling of the ankles, hands, or feet Low calcium level--muscle pain or cramps, confusion, tingling, or numbness in the hands or feet Osteonecrosis of the jaw--pain, swelling, or redness in the mouth, numbness  of the jaw, poor healing after dental work, unusual discharge from the mouth, visible bones in the mouth Severe bone, joint, or muscle pain Side effects that usually do not require medical attention (report to your care team if they continue or are bothersome): Constipation Fatigue Fever Loss of appetite Nausea Stomach pain This list may not describe all possible side effects. Call your doctor for medical advice about side effects. You may report side effects to FDA at 1-800-FDA-1088. Where should I keep my medication? This medication is given in a hospital or clinic. It will not be stored at home. NOTE: This sheet is a summary. It may  not cover all possible information. If you have questions about this medicine, talk to your doctor, pharmacist, or health care provider.  2024 Elsevier/Gold Standard (2021-06-03 00:00:00)

## 2023-06-20 ENCOUNTER — Encounter: Payer: Self-pay | Admitting: Nurse Practitioner

## 2023-06-28 ENCOUNTER — Ambulatory Visit: Payer: Commercial Managed Care - PPO

## 2023-06-29 ENCOUNTER — Inpatient Hospital Stay: Payer: Commercial Managed Care - PPO | Attending: Hematology

## 2023-06-29 VITALS — BP 131/76 | HR 75 | Temp 97.9°F | Resp 18 | Wt 209.1 lb

## 2023-06-29 DIAGNOSIS — Z5111 Encounter for antineoplastic chemotherapy: Secondary | ICD-10-CM | POA: Insufficient documentation

## 2023-06-29 DIAGNOSIS — Z1731 Human epidermal growth factor receptor 2 positive status: Secondary | ICD-10-CM | POA: Insufficient documentation

## 2023-06-29 DIAGNOSIS — C50411 Malignant neoplasm of upper-outer quadrant of right female breast: Secondary | ICD-10-CM | POA: Diagnosis not present

## 2023-06-29 DIAGNOSIS — Z170 Estrogen receptor positive status [ER+]: Secondary | ICD-10-CM | POA: Insufficient documentation

## 2023-06-29 DIAGNOSIS — Z1721 Progesterone receptor positive status: Secondary | ICD-10-CM | POA: Diagnosis not present

## 2023-06-29 DIAGNOSIS — C7951 Secondary malignant neoplasm of bone: Secondary | ICD-10-CM | POA: Diagnosis not present

## 2023-06-29 MED ORDER — ACETAMINOPHEN 325 MG PO TABS
650.0000 mg | ORAL_TABLET | Freq: Once | ORAL | Status: AC
  Administered 2023-06-29: 650 mg via ORAL
  Filled 2023-06-29: qty 2

## 2023-06-29 MED ORDER — PERTUZ-TRASTUZ-HYALURON-ZZXF 60-60-2000 MG-MG-U/ML CHEMO ~~LOC~~ SOLN
10.0000 mL | Freq: Once | SUBCUTANEOUS | Status: AC
  Administered 2023-06-29: 10 mL via SUBCUTANEOUS
  Filled 2023-06-29: qty 10

## 2023-06-29 MED ORDER — DIPHENHYDRAMINE HCL 25 MG PO CAPS
50.0000 mg | ORAL_CAPSULE | Freq: Once | ORAL | Status: AC
  Administered 2023-06-29: 50 mg via ORAL
  Filled 2023-06-29: qty 2

## 2023-06-29 NOTE — Patient Instructions (Signed)
 [facility]  Discharge Instructions: Thank you for choosing [facility] to provide your oncology and hematology care.   If you have a lab appointment with the Cancer Center, please go directly to the Cancer Center and check in at the registration area.   Wear comfortable clothing and clothing appropriate for easy access to any Portacath or PICC line.   We strive to give you quality time with your provider. You may need to reschedule your appointment if you arrive late (15 or more minutes).  Arriving late affects you and other patients whose appointments are after yours.  Also, if you miss three or more appointments without notifying the office, you may be dismissed from the clinic at the provider's discretion.      For prescription refill requests, have your pharmacy contact our office and allow 72 hours for refills to be completed.    Today you received the following chemotherapy and/or immunotherapy agents Phesgo      To help prevent nausea and vomiting after your treatment, we encourage you to take your nausea medication as directed.  BELOW ARE SYMPTOMS THAT SHOULD BE REPORTED IMMEDIATELY: *FEVER GREATER THAN 100.4 F (38 C) OR HIGHER *CHILLS OR SWEATING *NAUSEA AND VOMITING THAT IS NOT CONTROLLED WITH YOUR NAUSEA MEDICATION *UNUSUAL SHORTNESS OF BREATH *UNUSUAL BRUISING OR BLEEDING *URINARY PROBLEMS (pain or burning when urinating, or frequent urination) *BOWEL PROBLEMS (unusual diarrhea, constipation, pain near the anus) TENDERNESS IN MOUTH AND THROAT WITH OR WITHOUT PRESENCE OF ULCERS (sore throat, sores in mouth, or a toothache) UNUSUAL RASH, SWELLING OR PAIN  UNUSUAL VAGINAL DISCHARGE OR ITCHING   Items with * indicate a potential emergency and should be followed up as soon as possible or go to the Emergency Department if any problems should occur.  Please show the CHEMOTHERAPY ALERT CARD or IMMUNOTHERAPY  ALERT CARD at check-in to the Emergency Department and triage nurse.  Should you have questions after your visit or need to cancel or reschedule your appointment, please contact [facility]  Dept: [phone]  and follow the prompts.  Office hours are 8:00 a.m. to 4:30 p.m. Monday - Friday. Please note that voicemails left after 4:00 p.m. may not be returned until the following business day.  We are closed weekends and major holidays. You have access to a nurse at all times for urgent questions. Please call the main number to the clinic Dept: [phone] and follow the prompts.   For any non-urgent questions, you may also contact your provider using MyChart. We now offer e-Visits for anyone 62 and older to request care online for non-urgent symptoms. For details visit [email or web address].   Also download the MyChart app! Go to the app store, search "MyChart", open the app, select [place], and log in with your MyChart username and password.

## 2023-07-02 ENCOUNTER — Ambulatory Visit (HOSPITAL_COMMUNITY)
Admission: RE | Admit: 2023-07-02 | Discharge: 2023-07-02 | Disposition: A | Discharge status: Home / Self Care | Payer: Commercial Managed Care - PPO | Source: Ambulatory Visit | Attending: Nurse Practitioner | Admitting: Nurse Practitioner

## 2023-07-02 DIAGNOSIS — Z170 Estrogen receptor positive status [ER+]: Secondary | ICD-10-CM | POA: Insufficient documentation

## 2023-07-02 DIAGNOSIS — C50411 Malignant neoplasm of upper-outer quadrant of right female breast: Secondary | ICD-10-CM | POA: Insufficient documentation

## 2023-07-02 MED ORDER — HEPARIN SOD (PORK) LOCK FLUSH 100 UNIT/ML IV SOLN
500.0000 [IU] | Freq: Once | INTRAVENOUS | Status: AC
  Administered 2023-07-02: 500 [IU] via INTRAVENOUS

## 2023-07-02 MED ORDER — IOHEXOL 300 MG/ML  SOLN
100.0000 mL | Freq: Once | INTRAMUSCULAR | Status: AC | PRN
  Administered 2023-07-02: 100 mL via INTRAVENOUS

## 2023-07-07 ENCOUNTER — Other Ambulatory Visit: Payer: Self-pay | Admitting: Hematology

## 2023-07-07 ENCOUNTER — Other Ambulatory Visit (HOSPITAL_COMMUNITY): Payer: Self-pay | Admitting: Internal Medicine

## 2023-07-09 ENCOUNTER — Encounter: Payer: Self-pay | Admitting: Hematology

## 2023-07-19 ENCOUNTER — Ambulatory Visit: Payer: Commercial Managed Care - PPO | Admitting: Hematology

## 2023-07-19 ENCOUNTER — Other Ambulatory Visit: Payer: Commercial Managed Care - PPO

## 2023-07-19 ENCOUNTER — Ambulatory Visit: Payer: Commercial Managed Care - PPO

## 2023-07-19 NOTE — Assessment & Plan Note (Signed)
 stage IV [phone]), with bone and possible liver mets,  ER+/PR+/HER2+, Grade II-III   --diagnosed in 11/2019 with large right breast mass measuring up to 11cm and right lymphadenopathy. 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 Left axillary node biopsy was benign.  -01/01/20 Liver biopsy was negative but 01/12/20 Bone Biopsy was positive for metastatic carcinoma from primary breast cancer. ER/PR/HER2 were all negative, possibly related to decalcification. -She received THP 01/03/20 - 2/3/2 -on maintenance Herceptin and Phesgo injection every 3 weeks -repeated CT scan 11/09/2022 showed stable disease in bone and liver, no concerns for new lesion or evidence of progression.  -Echocardiogram done 02/28/2023 showed EF  55 - 60% with no changes from previous study done 08/2022.  -Per review of cardiology notes, next echocardiogram due in May 2025. -Cycle 14 given 04/06/2023, and cycle 15 given 04/27/2023, both given at [facility]. -05/18/2023 -today is cycle 16 day 1 -CT chest abdomen pelvis with contrast to be done in approximately 6 weeks. -Diagnostic mammogram due in June 2025. -echocardiogram due in May 2025 and has been ordered by cardiology/oncology  -most recent CT CAP done 07/02/2023. Results show no evidence of metastatic disease in the chest.  Small liver hypodensities are stable.  Metastatic lesions in the skeleton are not well-visualized on this exam.  No further evidence of recurrence or metastatic disease. -Continue Herceptin and Perjeta every 3 weeks.

## 2023-07-19 NOTE — Progress Notes (Unsigned)
 Patient Care Team: Luciano Cutter, PA-C as PCP - General (Physician Assistant) Almond Lint, MD as Consulting Physician (General Surgery) Malachy Mood, MD as Consulting Physician (Hematology) Dorothy Puffer, MD as Consulting Physician (Radiation Oncology) Pershing Proud, RN as Oncology Nurse Navigator Donnelly Angelica, RN as Oncology Nurse Navigator  Clinic Day:  07/20/2023  Referring physician: Malachy Mood, MD  ASSESSMENT & PLAN:   Assessment & Plan: Malignant neoplasm of upper-outer quadrant of right breast in female, estrogen receptor positive (HCC) stage IV Y([id number]), with bone and possible liver mets,  ER+/PR+/HER2+, Grade II-III   --diagnosed in 11/2019 with large right breast mass measuring up to 11cm and right lymphadenopathy. 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 Left axillary node biopsy was benign.  -01/01/20 Liver biopsy was negative but 01/12/20 Bone Biopsy was positive for metastatic carcinoma from primary breast cancer. ER/PR/HER2 were all negative, possibly related to decalcification. -She received THP 01/03/20 - 2/3/2 -on maintenance Herceptin and Phesgo injection every 3 weeks -repeated CT scan 11/09/2022 showed stable disease in bone and liver, no concerns for new lesion or evidence of progression.  -Echocardiogram done 02/28/2023 showed EF  55 - 60% with no changes from previous study done 08/2022.  -Per review of cardiology notes, next echocardiogram due in May 2025. -Cycle 14 given 04/06/2023, and cycle 15 given 04/27/2023, both given at [facility]. -05/18/2023 -today is cycle 16 day 1 -CT chest abdomen pelvis with contrast to be done in approximately 6 weeks. -Diagnostic mammogram due in June 2025. -echocardiogram due in May 2025 and has been ordered by cardiology/oncology  -most recent CT CAP done 07/02/2023. Results show no evidence of metastatic disease in the chest.  Small liver hypodensities are  stable.  Metastatic lesions in the skeleton are not well-visualized on this exam.  No further evidence of recurrence or metastatic disease. -Continue Herceptin and Perjeta every 3 weeks.       Plan:  Labs reviewed. Mild elevation of platelets with other labs stable and unremarkable.  Reviewed CT CAP showing no evidence of new progressive disease.  New echo has been ordered by cardio/oncology provider.  Continue with herceptin and perjeta every 3 weeks. Next 2 treatments to be given at [facility]. Labs/flush, follow up, and treatment as scheduled here 08/2023.   The patient understands the plans discussed today and is in agreement with them.  She knows to contact our office if she develops concerns prior to her next appointment.  I provided 25 minutes of face-to-face time during this encounter and > 50% was spent counseling as documented under my assessment and plan.    Carlean Jews, NP  [facility] - A DEPT OF [facility] [address] [place] [address] Dept: [phone] Dept Fax: [phone]   No orders of the defined types were placed in this encounter.     CHIEF COMPLAINT:  CC: Right breast cancer, estrogen receptor positive  Current Treatment: Herceptin and Perjeta every 3 weeks  INTERVAL HISTORY:  Carolyn Stewart is here today for repeat clinical assessment.  On 05/18/2023.  She had new CT CAP on 07/02/2023.  There is no evidence of recurrent disease in the chest.  No evidence of metastatic disease in the liver.  Small hypodensities are stable.  Metastatic lesions in the skeleton are not well-visualized on this exam.  No further evidence of recurrence or metastatic disease.  New echocardiogram due May 2025. This has been ordered per cardiology/oncology. She denies chest pain, chest pressure, or shortness of breath. She denies headaches or visual disturbances. She denies abdominal pain, nausea,  vomiting, or changes in bowel or bladder habits.   She denies fevers or chills. She denies pain. Her appetite is good. Her weight has been stable.  I have reviewed the past medical history, past surgical history, social history and family history with the patient and they are unchanged from previous note.  ALLERGIES:  has no known allergies.  MEDICATIONS:  Current Outpatient Medications  Medication Sig Dispense Refill   acetaminophen (TYLENOL) 500 MG tablet Take 1,000 mg by mouth every 6 (six) hours as needed for moderate pain or headache.     amLODipine (NORVASC) 10 MG tablet Take 2 tablets (20 mg total) by mouth daily. 60 tablet 11   Cholecalciferol (VITAMIN D3) 50 MCG (2000 UT) TABS Take 2,000 Units by mouth daily.      gabapentin (NEURONTIN) 100 MG capsule TAKE 2 CAPSULES BY MOUTH EVERY DAY AT BEDTIME 60 capsule 2   KLOR-CON M10 10 MEQ tablet TAKE 1 TABLET BY MOUTH EVERY DAY 90 tablet 1   letrozole (FEMARA) 2.5 MG tablet Take 1 tablet (2.5 mg total) by mouth daily. 90 tablet 3   lidocaine-prilocaine (EMLA) cream Apply 1 Application topically as needed. 30 g 1   losartan (COZAAR) 100 MG tablet Take 1 tablet (100 mg total) by mouth daily. 90 tablet 3   metoprolol succinate (TOPROL-XL) 100 MG 24 hr tablet TAKE 1 TABLET BY MOUTH EVERY DAY 90 tablet 3   omeprazole (PRILOSEC OTC) 20 MG tablet Take 20 mg by mouth daily.     No current facility-administered medications for this visit.   Facility-Administered Medications Ordered in Other Visits  Medication Dose Route Frequency Provider Last Rate Last Admin   0.9 %  sodium chloride infusion   Intravenous Once Malachy Mood, MD       heparin lock flush 100 unit/mL  500 Units Intracatheter Once PRN Malachy Mood, MD       pertuz-trastuz-hyaluron-zzxf (PHESGO) 600-600-20000 MG-MG-U/10ML chemo SQ injection maintenance dose 10 mL  10 mL Subcutaneous Once Malachy Mood, MD       sodium chloride flush (NS) 0.9 % injection 10 mL  10 mL Intravenous PRN Malachy Mood, MD        sodium chloride flush (NS) 0.9 % injection 10 mL  10 mL Intracatheter PRN Malachy Mood, MD        HISTORY OF PRESENT ILLNESS:   Oncology History Overview Note  Cancer Staging Malignant neoplasm of upper-outer quadrant of right breast in female, estrogen receptor positive (HCC) Staging form: Breast, AJCC 8th Edition - Clinical stage from 11/25/2019: Stage IIA (cT3, cN1, cM0, G2, ER+, PR+, HER2+) - Signed by Malachy Mood, MD on 12/03/2019    Malignant neoplasm of upper-outer quadrant of right breast in female, estrogen receptor positive (HCC)  11/13/2019 Mammogram    Diagnostic Mammogram 11/13/19  IMPRESSION: -Highly suspicious mass and calcifications in the right breast centered between 9 and 12 o'clock spanning up to 11 cm.  -Multiple masses in the right axilla. One of the masses contains calcifications, denoted as mass number 2 measuring 9 x 11 by 10 mm. Several of these masses do not appear to represent definitive lymph nodes. Others may be small but abnormal appearing lymph nodes. Taken in total, a cluster of masses in the right axilla spans up to 3.7 cm.  -There are  fibrocystic changes on the left. There are also some solid-appearing masses. A solid appearing mass at 10:30, 6 cm from the nipple measures 9 x 6 x 6 mm. An adjacent solid mass at 10 o'clock, 6 cm from the nipple measures 8 x 6 by 5 mm. Another solid-appearing mass at 11 o'clock, 1 cm from the nipple measures 5 x 4 by 5 mm. Fibrocystic changes are seen at 10 o'clock, 12 o'clock, and 4 o'clock. Another solid-appearing mass seen at 5 o'clock, 4 cm from the nipple measuring 6 x 3 x 4 mm. There is a single abnormal node in the left axilla with a cortex measuring 5 mm and restriction of the fatty hilum.   11/25/2019 Initial Biopsy   Diagnosis 1. Breast, right, needle core biopsy, upper outer - INVASIVE MAMMARY CARCINOMA - MAMMARY CARCINOMA IN SITU WITH CALCIFICATIONS AND NECROSIS - SEE COMMENT 2. Lymph node, needle/core  biopsy, right axilla - INVASIVE MAMMARY CARCINOMA - SEE COMMENT 3. Lymph node, needle/core biopsy, left axilla - BENIGN LYMPH NODE - NO CARCINOMA IDENTIFIED Microscopic Comment 1. The biopsy material shows an infiltrative proliferation of cells arranged linearly and in small clusters. Based on the biopsy, the carcinoma appears Nottingham grade 2 of 3 and measures 1.2 cm in greatest linear extent. E-cadherin and prognostic markers (ER/PR/ki-67/HER2)are pending and will be reported in an addendum. Dr. Berneice Heinrich reviewed the case and agrees with the above diagnosis. These results were called to [facility] on November 26, 2019. 2. Definitive nodal tissue is not identified. This biopsy has a slightly different architecture morphology than what is seen in part 1. The carcinoma measures 0.7 cm in greatest dimension. E-cadherin and prognostic markers (ER, PR, Ki-67 and HER-2) are pending and will be reported in an addendum.   11/25/2019 Receptors her2   1. PROGNOSTIC INDICATORS Results: IMMUNOHISTOCHEMICAL AND MORPHOMETRIC ANALYSIS PERFORMED MANUALLY The tumor cells are POSITIVE for Her2 (3+). Estrogen Receptor: 90%, POSITIVE, STRONG STAINING INTENSITY Progesterone Receptor: 20%, POSITIVE, STRONG STAINING INTENSITY Proliferation Marker Ki67: 30%  2. PROGNOSTIC INDICATORS Results: IMMUNOHISTOCHEMICAL AND MORPHOMETRIC ANALYSIS PERFORMED MANUALLY The tumor cells are POSITIVE for Her2 (3+). Estrogen Receptor: 90%, POSITIVE, STRONG STAINING INTENSITY Progesterone Receptor: 25%, POSITIVE, STRONG STAINING INTENSITY Proliferation Marker Ki67: 30%   11/25/2019 Cancer Staging   Staging form: Breast, AJCC 8th Edition - Clinical stage from 11/25/2019: Stage IIA (cT3, cN1, cM0, G2, ER+, PR+, HER2+) - Signed by Malachy Mood, MD on 12/03/2019   12/01/2019 Initial Diagnosis   Malignant neoplasm of upper-outer quadrant of right breast in female, estrogen receptor positive (HCC)   12/10/2019 Imaging    Bone Scan  IMPRESSION: Findings concerning for bony metastatic disease in the left acetabulum region, L3 vertebral body region, and anterolateral right tenth rib.   Increased uptake in several large joints likely is of arthropathic etiology.   12/11/2019 Imaging   CT CAP w contrast  IMPRESSION: Prominent glandular tissue in the central right breast with nipple retraction, likely corresponding to the patient's known right breast neoplasm.   Prominent right axillary nodes measuring up to 8 mm short axis, suspicious for nodal metastases in this clinical context.   3 mm subpleural pulmonary nodule in the right lower lobe, likely benign. However, follow-up CT chest is suggested in 3-6 months.   Multiple hepatic lesions, including a dominant 2.4 cm lesion in segment 6, which is considered indeterminate. Metastasis is not excluded. Consider MRI abdomen with/without contrast for further characterization.   11 mm sclerotic lesion along the left  posterior aspect of the T7 vertebral body raises concern for isolated osseous metastasis. Correlate with priors if available. Benign vertebral hemangioma at T11.   12/12/2019 Breast MRI   IMPRESSION: 1. The biopsy proven malignancy in the right breast involves all 4 quadrants and spans up to 9 cm by MRI. There is enhancement within the skin at the level of the nipple.   2. Multiple matted masses are seen in the right axilla, either abnormal metastatic lymph nodes or a combination of masses and metastatic lymph nodes. One of these has been previously biopsied showing invasive mammary carcinoma without definitive nodal tissue.   3.  There is a 1.5 cm Rotter's lymph node on the right.   4. There is markedly abnormal enhancement and edema in the right pectoralis major muscle spanning 6-7 cm, suspicious for metastatic disease.   5. There is diffuse nodular enhancement throughout the left breast, which may correspond with the  solid-appearing masses seen on ultrasound. These all have a similar appearance on MRI.   6.  No findings of left axillary lymphadenopathy.   12/15/2019 Procedure   PAC placed    12/24/2019 Imaging   MRI abdomen  IMPRESSION: 1. Multifocal enhancing bone lesions are noted within the lumbar spine, and bony pelvis compatible with metastatic disease. 2. Four, small peripherally enhancing cystic lesions are noted within the liver worrisome for metastatic disease. 3. 2 enhancing liver lesions are also noted with signal and enhancement characteristics consistent with benign liver hemangioma. 4. Well-circumscribed, enhancing T2 and T1 hypointense structure within the right adnexal region is indeterminate. It is unclear whether not this is arising from the right ovary, broad ligament or right side of uterine fundus. Differential considerations include fibrothecoma, versus pedunculated uterine leiomyoma. Metastatic disease is considered less favored. Attention on follow-up imaging is recommended.     01/03/2020 - 05/27/2020 Chemotherapy   First line THP q3weeks starting 01/03/20-05/27/20   01/05/2020 Imaging   US Abdomen  IMPRESSION: 1. Suspect hematoma in the medial right lobe of the liver in the area of previous biopsy. Note that recent MR does not show lesion in this area. This area has ill-defined margins and measures 3.9 x 4.0 x 3.5 cm. No perihepatic fluid.   2. Probable hemangiomas elsewhere in the right lobe of the liver. Note that several smaller lesions seen on recent MR are not appreciable by ultrasound.   3.  Study otherwise unremarkable.   01/12/2020 Pathology Results   FINAL MICROSCOPIC DIAGNOSIS:   A. BONE, SCLEROTIC LESION, LEFT ANTERIOR ILIAC SPINE, BIOPSY:  - Metastatic carcinoma, consistent with patient's clinical history of  primary breast carcinoma  - See comment   COMMENT:  Immunohistochemical stains show that the tumor cells are positive for  CK7 and GATA3; and  negative for CK20, consistent with above  interpretation.   PROGNOSTIC INDICATOR RESULTS:  The tumor cells are NEGATIVE for Her2 (0); see comment  Estrogen Receptor: NEGATIVE; see comment  Progesterone Receptor: NEGATIVE; see comment    03/23/2020 Imaging   CT CAP IMPRESSION: 1. Right lower lobe perifissural nodule is slightly more conspicuous on today's study. Close attention on follow-up recommended. 2. Interval progression of segment IV liver lesion, concerning for metastatic progression. The remaining visualized liver lesions are stable to smaller in the interval. 3. Interval evolution in appearance of thoracolumbar spinal lesions and left iliac crest lesion, potentially reflecting response to therapy/healing. No definite new osseous lesions on today's study. 4. New small fluid collection right cul-de-sac. 5. Probable fibroid change in  the uterus including exophytic right fundal lesion. 6. Aortic Atherosclerosis (ICD10-I70.0).   03/31/2020 Imaging   MR ABD IMPRESSION: 1. Enlarging hepatic lesion in hepatic subsegment IV suspicious for hepatic metastasis. The it is uncertain whether the change in the short interval is due to technical factors with different modalities or true enlargement. 2. Lesion in the posterior RIGHT hepatic lobe with imaging features that are atypical for hemangioma but stability and signs of enhancement on later phase raising this question. Another more classic appearing may angioma seen inferior to this location in hepatic subsegment VI. Attention on follow-up. 3. Heterogeneous enhancement throughout the spine particularly at L3 as exhibited on the prior study with very patchy marrow signal remains suspicious for bony metastatic lesions in this patient with known bone metastases.   04/01/2020 Imaging   Bone scan IMPRESSION: Good response to therapy with resolution of sites of uptake seen previously at the cervical spine and posterior RIGHT tenth rib  as well as a diminished degree of uptake at remaining previously identified osseous metastatic foci.   04/28/2020 PET scan   IMPRESSION: 1. No hypermetabolic lesions to suggest active metastatic disease involving the liver or osseous structures. 2. Stable small low-attenuation liver lesions, likely treated disease.   06/18/2020 -  Chemotherapy   Given her excellent response, I switched to maintenance therapy with Herceptin, Perjeta (Phesgo) q3weeks and Letrozole.   06/18/2020 -  Chemotherapy   Zometa starting 06/18/20   06/18/2020 -  Anti-estrogen oral therapy   Letrozole 2.5mg  once dialy.    06/18/2020 - 06/16/2022 Chemotherapy   Patient is on Treatment Plan : BREAST Trastuzumab + Pertuzumab q21d     09/03/2020 Imaging   Bone Scan IMPRESSION: 1. Significant interval improvement compared to previous studies. Only mild uptake remains in the pelvis as above. There has been significant interval improvement over time.  CT C/A/P IMPRESSION: 1. Hypodense liver lesions are slightly decreased in size compared to prior examination, consistent with treatment response of hepatic metastatic disease. 2. Unchanged sclerotic lesion of the anterior left iliac crest. Unchanged lytic superior endplate deformity of L3, which remains equivocal for metastatic lesion versus mechanical Schmorl deformity. No CT evidence of new osseous metastatic disease. 3. No evidence of new metastatic disease in the chest, abdomen, or pelvis. 4. Coronary artery disease.   05/05/2022 Imaging    IMPRESSION: 1. Interval decreased size of the largest hepatic metastatic lesion with stable size of the additional subcentimeter bilobar hepatic lesions. Continued decrease in size of the bilobar hepatic metastases. 2. Similar appearance of the osseous metatatic disease. No new aggressive lytic or blastic lesion of bone. 3. No evidence of new or progressive metastatic disease in the chest, abdomen or pelvis.      05/05/2022 Imaging    IMPRESSION: Faint residual radiotracer uptake in the left hemipelvis.   New focus of mild abnormal radiotracer uptake in the right lateral seventh rib without discrete CT correlate on same day examination, nonspecific suggest correlation with history of interval trauma.     07/07/2022 -  Chemotherapy   Patient is on Treatment Plan : BREAST Trastuzumab + Pertuzumab q21d     02/28/2023 Echocardiogram   IMPRESSIONS   1. Left ventricular ejection fraction, by estimation, is 55 to 60%. The left ventricle has normal function. The left ventricle has no regional wall motion abnormalities. Left ventricular diastolic parameters are consistent with Grade I diastolic  dysfunction (impaired relaxation). The average left ventricular global longitudinal strain is -17.1 %. The global longitudinal strain is normal.  2. Right ventricular systolic function is normal. The right ventricular size is normal. There is normal pulmonary artery systolic pressure. The estimated right ventricular systolic pressure is 28.8 mmHg.   3. The mitral valve is normal in structure. No evidence of mitral valve regurgitation. No evidence of mitral stenosis.   4. The aortic valve is tricuspid. Aortic valve regurgitation is trivial. No aortic stenosis is present.   5. The inferior vena cava is normal in size with greater than 50% respiratory variability, suggesting right atrial pressure of 3 mmHg.  Comparison(s): No significant change from prior study. Prior GLS: -18.0.      07/02/2023 Imaging   CT CAP with contrast  IMPRESSION: CHEST:  No evidence of breast cancer metastasis in the chest.  PELVIS:  1. No evidence recurrent breast cancer within liver. Stable small hypodensities. 2. Previous described metastatic skeletal lesions are not well appreciated. 3. No new or progressive disease.       REVIEW OF SYSTEMS:   Constitutional: Denies fevers, chills or abnormal weight loss Eyes: Denies  blurriness of vision Ears, nose, mouth, throat, and face: Denies mucositis or sore throat Respiratory: Denies cough, dyspnea or wheezes Cardiovascular: Denies palpitation, chest discomfort or lower extremity swelling Gastrointestinal:  Denies nausea, heartburn or change in bowel habits Skin: Denies abnormal skin rashes Lymphatics: Denies new lymphadenopathy or easy bruising Neurological:Denies numbness, tingling or new weaknesses Behavioral/Psych: Mood is stable, no new changes  All other systems were reviewed with the patient and are negative.   VITALS:   Today's Vitals   07/20/23 1314 07/20/23 1315  BP: 120/80   Pulse: (!) 103   Resp: 18   Temp: 98 F (36.7 C)   TempSrc: Temporal   SpO2: 96%   Weight: 208 lb 6.4 oz (94.5 kg)   Height: 5\' 7"  (1.702 m)   PainSc:  0-No pain   Body mass index is 32.64 kg/m.   Wt Readings from Last 3 Encounters:  07/20/23 208 lb 6.4 oz (94.5 kg)  06/29/23 209 lb 1.6 oz (94.8 kg)  06/08/23 211 lb 14.4 oz (96.1 kg)    Body mass index is 32.64 kg/m.  Performance status (ECOG): 0 - Asymptomatic  PHYSICAL EXAM:   GENERAL:alert, no distress and comfortable SKIN: skin color, texture, turgor are normal, no rashes or significant lesions EYES: normal, Conjunctiva are pink and non-injected, sclera clear OROPHARYNX:no exudate, no erythema and lips, buccal mucosa, and tongue normal  NECK: supple, thyroid normal size, non-tender, without nodularity LYMPH:  no palpable lymphadenopathy in the cervical, axillary or inguinal LUNGS: clear to auscultation and percussion with normal breathing effort HEART: regular rate & rhythm and no murmurs and no lower extremity edema ABDOMEN:abdomen soft, non-tender and normal bowel sounds Musculoskeletal:no cyanosis of digits and no clubbing  NEURO: alert & oriented x 3 with fluent speech, no focal motor/sensory deficits  LABORATORY DATA:  I have reviewed the data as listed    Component Value Date/Time   NA 143  07/20/2023 1249   K 3.8 07/20/2023 1249   CL 109 07/20/2023 1249   CO2 28 07/20/2023 1249   GLUCOSE 104 (H) 07/20/2023 1249   BUN 15 07/20/2023 1249   CREATININE 0.91 07/20/2023 1249   CALCIUM 9.9 07/20/2023 1249   PROT 7.7 07/20/2023 1249   ALBUMIN 4.3 07/20/2023 1249   AST 14 (L) 07/20/2023 1249   ALT 16 07/20/2023 1249   ALKPHOS 111 07/20/2023 1249   BILITOT 0.4 07/20/2023 1249   GFRNONAA >60 07/20/2023 1249  GFRAA >60 01/22/2020 0824   Lab Results  Component Value Date   WBC 8.1 07/20/2023   NEUTROABS 4.4 07/20/2023   HGB 12.9 07/20/2023   HCT 39.2 07/20/2023   MCV 89.1 07/20/2023   PLT 454 (H) 07/20/2023       RADIOGRAPHIC STUDIES: CT CHEST ABDOMEN PELVIS W CONTRAST Result Date: 07/20/2023 CLINICAL DATA:  Invasive breast cancer. Stage IV breast cancer * Tracking Code: BO * EXAM: CT CHEST, ABDOMEN, AND PELVIS WITH CONTRAST TECHNIQUE: Multidetector CT imaging of the chest, abdomen and pelvis was performed following the standard protocol during bolus administration of intravenous contrast. RADIATION DOSE REDUCTION: This exam was performed according to the departmental dose-optimization program which includes automated exposure control, adjustment of the mA and/or kV according to patient size and/or use of iterative reconstruction technique. CONTRAST:  OMNIPAQUE IOHEXOL 300 MG/ML  SOLN COMPARISON:  None Available. FINDINGS: CT CHEST FINDINGS Cardiovascular: No significant vascular findings. Normal heart size. No pericardial effusion. Port in the anterior chest wall with tip in distal SVC. Mediastinum/Nodes: No axillary or supraclavicular adenopathy. No mediastinal or hilar adenopathy. No pericardial fluid. Esophagus normal. Lungs/Pleura: No suspicious pulmonary nodules. Normal pleural. Airways normal. Musculoskeletal: No aggressive osseous lesion. CT ABDOMEN AND PELVIS FINDINGS Hepatobiliary: Multiple small hypodense lesions in the liver not changed from prior. No enlarging  enhancing lesion liver. Normal biliary tree. Pancreas: Pancreas is normal. No ductal dilatation. No pancreatic inflammation. Spleen: Normal spleen Adrenals/urinary tract: Adrenal glands and kidneys are normal. The ureters and bladder normal. Stomach/Bowel: Stomach, small bowel, appendix, and cecum are normal. The colon and rectosigmoid colon are normal. Vascular/Lymphatic: Abdominal aorta is normal caliber with atherosclerotic calcification. There is no retroperitoneal or periportal lymphadenopathy. No pelvic lymphadenopathy. Reproductive: Uterus and adnexa unremarkable. Other: No peritoneal or omental metastasis. Musculoskeletal: No aggressive osseous lesion. Hemangioma in the T12 vertebral body. Large Schmorl's node in the superior endplate of L3. Previous described skeletal metastasis are not well appreciated on current exam. IMPRESSION: CHEST: No evidence of breast cancer metastasis in the chest. PELVIS: 1. No evidence recurrent breast cancer within liver. Stable small hypodensities. 2. Previous described metastatic skeletal lesions are not well appreciated. 3. No new or progressive disease. Electronically Signed   By: Genevive Bi M.D.   On: 07/20/2023 12:05

## 2023-07-20 ENCOUNTER — Inpatient Hospital Stay: Payer: Commercial Managed Care - PPO

## 2023-07-20 ENCOUNTER — Inpatient Hospital Stay: Payer: Commercial Managed Care - PPO | Admitting: Nurse Practitioner

## 2023-07-20 ENCOUNTER — Encounter: Payer: Self-pay | Admitting: Nurse Practitioner

## 2023-07-20 VITALS — HR 88

## 2023-07-20 VITALS — BP 120/80 | HR 103 | Temp 98.0°F | Resp 18 | Ht 67.0 in | Wt 208.4 lb

## 2023-07-20 DIAGNOSIS — C50411 Malignant neoplasm of upper-outer quadrant of right female breast: Secondary | ICD-10-CM

## 2023-07-20 DIAGNOSIS — Z5111 Encounter for antineoplastic chemotherapy: Secondary | ICD-10-CM | POA: Diagnosis not present

## 2023-07-20 DIAGNOSIS — Z170 Estrogen receptor positive status [ER+]: Secondary | ICD-10-CM | POA: Diagnosis not present

## 2023-07-20 LAB — CBC WITH DIFFERENTIAL (CANCER CENTER ONLY)
Abs Immature Granulocytes: 0.02 10*3/uL (ref 0.00–0.07)
Basophils Absolute: 0 10*3/uL (ref 0.0–0.1)
Basophils Relative: 0 %
Eosinophils Absolute: 0.2 10*3/uL (ref 0.0–0.5)
Eosinophils Relative: 3 %
HCT: 39.2 % (ref 36.0–46.0)
Hemoglobin: 12.9 g/dL (ref 12.0–15.0)
Immature Granulocytes: 0 %
Lymphocytes Relative: 34 %
Lymphs Abs: 2.7 10*3/uL (ref 0.7–4.0)
MCH: 29.3 pg (ref 26.0–34.0)
MCHC: 32.9 g/dL (ref 30.0–36.0)
MCV: 89.1 fL (ref 80.0–100.0)
Monocytes Absolute: 0.7 10*3/uL (ref 0.1–1.0)
Monocytes Relative: 9 %
Neutro Abs: 4.4 10*3/uL (ref 1.7–7.7)
Neutrophils Relative %: 54 %
Platelet Count: 454 10*3/uL — ABNORMAL HIGH (ref 150–400)
RBC: 4.4 MIL/uL (ref 3.87–5.11)
RDW: 13.7 % (ref 11.5–15.5)
WBC Count: 8.1 10*3/uL (ref 4.0–10.5)
nRBC: 0 % (ref 0.0–0.2)

## 2023-07-20 LAB — CMP (CANCER CENTER ONLY)
ALT: 16 U/L (ref 0–44)
AST: 14 U/L — ABNORMAL LOW (ref 15–41)
Albumin: 4.3 g/dL (ref 3.5–5.0)
Alkaline Phosphatase: 111 U/L (ref 38–126)
Anion gap: 6 (ref 5–15)
BUN: 15 mg/dL (ref 8–23)
CO2: 28 mmol/L (ref 22–32)
Calcium: 9.9 mg/dL (ref 8.9–10.3)
Chloride: 109 mmol/L (ref 98–111)
Creatinine: 0.91 mg/dL (ref 0.44–1.00)
GFR, Estimated: >60 mL/min (ref >60)
Glucose, Bld: 104 mg/dL — ABNORMAL HIGH (ref 70–99)
Potassium: 3.8 mmol/L (ref 3.5–5.1)
Sodium: 143 mmol/L (ref 135–145)
Total Bilirubin: 0.4 mg/dL (ref 0.0–1.2)
Total Protein: 7.7 g/dL (ref 6.5–8.1)

## 2023-07-20 MED ORDER — DIPHENHYDRAMINE HCL 25 MG PO CAPS
50.0000 mg | ORAL_CAPSULE | Freq: Once | ORAL | Status: AC
  Administered 2023-07-20: 50 mg via ORAL
  Filled 2023-07-20: qty 2

## 2023-07-20 MED ORDER — PERTUZ-TRASTUZ-HYALURON-ZZXF 60-60-2000 MG-MG-U/ML CHEMO ~~LOC~~ SOLN
10.0000 mL | Freq: Once | SUBCUTANEOUS | Status: AC
  Administered 2023-07-20: 10 mL via SUBCUTANEOUS
  Filled 2023-07-20: qty 10

## 2023-07-20 MED ORDER — SODIUM CHLORIDE 0.9 % IV SOLN: Freq: Once | INTRAVENOUS | Status: DC

## 2023-07-20 MED ORDER — ACETAMINOPHEN 325 MG PO TABS
650.0000 mg | ORAL_TABLET | Freq: Once | ORAL | Status: AC
  Administered 2023-07-20: 650 mg via ORAL
  Filled 2023-07-20: qty 2

## 2023-07-20 MED ORDER — SODIUM CHLORIDE 0.9% FLUSH
10.0000 mL | INTRAVENOUS | Status: DC | PRN
Start: 2023-07-20 — End: 2023-07-20

## 2023-07-20 MED ORDER — HEPARIN SOD (PORK) LOCK FLUSH 100 UNIT/ML IV SOLN
500.0000 [IU] | Freq: Once | INTRAVENOUS | Status: DC | PRN
Start: 2023-07-20 — End: 2023-07-20

## 2023-07-20 MED ORDER — SODIUM CHLORIDE 0.9% FLUSH
10.0000 mL | Freq: Once | INTRAVENOUS | Status: AC
Start: 2023-07-20 — End: 2023-07-20
  Administered 2023-07-20: 10 mL

## 2023-07-20 NOTE — Patient Instructions (Signed)
 [facility]  Discharge Instructions: Thank you for choosing [facility] to provide your oncology and hematology care.   If you have a lab appointment with the Cancer Center, please go directly to the Cancer Center and check in at the registration area.   Wear comfortable clothing and clothing appropriate for easy access to any Portacath or PICC line.   We strive to give you quality time with your provider. You may need to reschedule your appointment if you arrive late (15 or more minutes).  Arriving late affects you and other patients whose appointments are after yours.  Also, if you miss three or more appointments without notifying the office, you may be dismissed from the clinic at the provider's discretion.      For prescription refill requests, have your pharmacy contact our office and allow 72 hours for refills to be completed.    Today you received the following chemotherapy and/or immunotherapy agents: Phesgo.      To help prevent nausea and vomiting after your treatment, we encourage you to take your nausea medication as directed.  BELOW ARE SYMPTOMS THAT SHOULD BE REPORTED IMMEDIATELY: *FEVER GREATER THAN 100.4 F (38 C) OR HIGHER *CHILLS OR SWEATING *NAUSEA AND VOMITING THAT IS NOT CONTROLLED WITH YOUR NAUSEA MEDICATION *UNUSUAL SHORTNESS OF BREATH *UNUSUAL BRUISING OR BLEEDING *URINARY PROBLEMS (pain or burning when urinating, or frequent urination) *BOWEL PROBLEMS (unusual diarrhea, constipation, pain near the anus) TENDERNESS IN MOUTH AND THROAT WITH OR WITHOUT PRESENCE OF ULCERS (sore throat, sores in mouth, or a toothache) UNUSUAL RASH, SWELLING OR PAIN  UNUSUAL VAGINAL DISCHARGE OR ITCHING   Items with * indicate a potential emergency and should be followed up as soon as possible or go to the Emergency Department if any problems should occur.  Please show the CHEMOTHERAPY ALERT CARD or IMMUNOTHERAPY  ALERT CARD at check-in to the Emergency Department and triage nurse.  Should you have questions after your visit or need to cancel or reschedule your appointment, please contact [facility] - A DEPT OF [facility]  Dept: [phone]  and follow the prompts.  Office hours are 8:00 a.m. to 4:30 p.m. Monday - Friday. Please note that voicemails left after 4:00 p.m. may not be returned until the following business day.  We are closed weekends and major holidays. You have access to a nurse at all times for urgent questions. Please call the main number to the clinic Dept: [phone] and follow the prompts.   For any non-urgent questions, you may also contact your provider using MyChart. We now offer e-Visits for anyone 2 and older to request care online for non-urgent symptoms. For details visit [email or web address].   Also download the MyChart app! Go to the app store, search "MyChart", open the app, select [place], and log in with your MyChart username and password.

## 2023-07-21 LAB — CANCER ANTIGEN 27.29: CA 27.29: 38.8 U/mL — ABNORMAL HIGH (ref 0.0–38.6)

## 2023-08-09 ENCOUNTER — Ambulatory Visit: Payer: Commercial Managed Care - PPO

## 2023-08-10 ENCOUNTER — Encounter: Payer: Self-pay | Admitting: Hematology

## 2023-08-10 ENCOUNTER — Inpatient Hospital Stay: Payer: Commercial Managed Care - PPO | Attending: Hematology

## 2023-08-10 VITALS — BP 118/73 | HR 80 | Temp 98.2°F | Resp 18 | Ht 67.0 in | Wt 208.5 lb

## 2023-08-10 DIAGNOSIS — Z1721 Progesterone receptor positive status: Secondary | ICD-10-CM | POA: Diagnosis not present

## 2023-08-10 DIAGNOSIS — Z170 Estrogen receptor positive status [ER+]: Secondary | ICD-10-CM | POA: Insufficient documentation

## 2023-08-10 DIAGNOSIS — Z5111 Encounter for antineoplastic chemotherapy: Secondary | ICD-10-CM | POA: Diagnosis present

## 2023-08-10 DIAGNOSIS — Z1731 Human epidermal growth factor receptor 2 positive status: Secondary | ICD-10-CM | POA: Insufficient documentation

## 2023-08-10 DIAGNOSIS — C7951 Secondary malignant neoplasm of bone: Secondary | ICD-10-CM | POA: Diagnosis not present

## 2023-08-10 DIAGNOSIS — C50411 Malignant neoplasm of upper-outer quadrant of right female breast: Secondary | ICD-10-CM | POA: Diagnosis not present

## 2023-08-10 MED ORDER — PERTUZ-TRASTUZ-HYALURON-ZZXF 60-60-2000 MG-MG-U/ML CHEMO ~~LOC~~ SOLN
10.0000 mL | Freq: Once | SUBCUTANEOUS | Status: AC
  Administered 2023-08-10: 10 mL via SUBCUTANEOUS
  Filled 2023-08-10: qty 10

## 2023-08-10 MED ORDER — ZOLEDRONIC ACID 4 MG/100ML IV SOLN
4.0000 mg | Freq: Once | INTRAVENOUS | Status: AC
  Administered 2023-08-10: 4 mg via INTRAVENOUS
  Filled 2023-08-10: qty 100

## 2023-08-10 MED ORDER — SODIUM CHLORIDE 0.9 % IV SOLN: Freq: Once | INTRAVENOUS | Status: AC

## 2023-08-10 MED ORDER — SODIUM CHLORIDE 0.9% FLUSH
10.0000 mL | Freq: Once | INTRAVENOUS | Status: AC
  Administered 2023-08-10: 10 mL

## 2023-08-10 MED ORDER — ACETAMINOPHEN 325 MG PO TABS
650.0000 mg | ORAL_TABLET | Freq: Once | ORAL | Status: AC
  Administered 2023-08-10: 650 mg via ORAL
  Filled 2023-08-10: qty 2

## 2023-08-10 MED ORDER — DIPHENHYDRAMINE HCL 25 MG PO CAPS
50.0000 mg | ORAL_CAPSULE | Freq: Once | ORAL | Status: AC
  Administered 2023-08-10: 50 mg via ORAL
  Filled 2023-08-10: qty 2

## 2023-08-10 MED ORDER — HEPARIN SOD (PORK) LOCK FLUSH 100 UNIT/ML IV SOLN
500.0000 [IU] | Freq: Once | INTRAVENOUS | Status: AC
  Administered 2023-08-10: 500 [IU]

## 2023-08-10 NOTE — Patient Instructions (Addendum)
 [facility] - A DEPT OF [facility]   Discharge Instructions: Thank you for choosing [facility] to provide your oncology and hematology care.   If you have a lab appointment with the Cancer Center, please go directly to the Cancer Center and check in at the registration area.   Wear comfortable clothing and clothing appropriate for easy access to any Portacath or PICC line.   We strive to give you quality time with your provider. You may need to reschedule your appointment if you arrive late (15 or more minutes).  Arriving late affects you and other patients whose appointments are after yours.  Also, if you miss three or more appointments without notifying the office, you may be dismissed from the clinic at the provider's discretion.      For prescription refill requests, have your pharmacy contact our office and allow 72 hours for refills to be completed.    Today you received the following chemotherapy and/or immunotherapy agents Phesgo /Zometa        To help prevent nausea and vomiting after your treatment, we encourage you to take your nausea medication as directed.  BELOW ARE SYMPTOMS THAT SHOULD BE REPORTED IMMEDIATELY: *FEVER GREATER THAN 100.4 F (38 C) OR HIGHER *CHILLS OR SWEATING *NAUSEA AND VOMITING THAT IS NOT CONTROLLED WITH YOUR NAUSEA MEDICATION *UNUSUAL SHORTNESS OF BREATH *UNUSUAL BRUISING OR BLEEDING *URINARY PROBLEMS (pain or burning when urinating, or frequent urination) *BOWEL PROBLEMS (unusual diarrhea, constipation, pain near the anus) TENDERNESS IN MOUTH AND THROAT WITH OR WITHOUT PRESENCE OF ULCERS (sore throat, sores in mouth, or a toothache) UNUSUAL RASH, SWELLING OR PAIN  UNUSUAL VAGINAL DISCHARGE OR ITCHING   Items with * indicate a potential emergency and should be followed up as soon as possible or go to the Emergency Department if any problems should occur.  Please show the CHEMOTHERAPY ALERT CARD or  IMMUNOTHERAPY ALERT CARD at check-in to the Emergency Department and triage nurse.  Should you have questions after your visit or need to cancel or reschedule your appointment, please contact [facility]  Dept: [phone]  and follow the prompts.  Office hours are 8:00 a.m. to 4:30 p.m. Monday - Friday. Please note that voicemails left after 4:00 p.m. may not be returned until the following business day.  We are closed weekends and major holidays. You have access to a nurse at all times for urgent questions. Please call the main number to the clinic Dept: [phone] and follow the prompts.   For any non-urgent questions, you may also contact your provider using MyChart. We now offer e-Visits for anyone 73 and older to request care online for non-urgent symptoms. For details visit [email or web address].   Also download the MyChart app! Go to the app store, search MyChart, open the app, select [place], and log in with your MyChart username and password.  Pertuzumab ; Trastuzumab ; Hyaluronidase Injection What is this medication? PERTUZUMAB ; TRASTUZUMAB ; HYALURONIDASE (per TOOZ ue mab; tras TOO zoo mab; hye al ur ON i dase) treats breast cancer. Pertuzumab  and trastuzumab  work by blocking a protein that causes cancer cells to grow and multiply. This helps to slow or stop the spread of cancer cells. Hyaluronidase works by increasing the absorption of other medications in the body to help them work better. It is a combination medication that contains two monoclonal antibodies. This medicine may be used for other purposes; ask your health care  provider or pharmacist if you have questions. COMMON BRAND NAME(S): PHESGO  What should I tell my care team before I take this medication? They need to know if you have any of these conditions: Heart failure High blood pressure Irregular heartbeat or rhythm Lung disease An unusual or allergic  reaction to pertuzumab , trastuzumab , hyaluronidase, other medications, foods, dyes, or preservatives Pregnant or trying to get pregnant Breast-feeding How should I use this medication? This medication is injected under the skin. It is usually given by your care team in a hospital or clinic setting. It may also be given at home by your care team. Talk to your care team about the use of this medication in children. Special care may be needed. Overdosage: If you think you have taken too much of this medicine contact a poison control center or emergency room at once. NOTE: This medicine is only for you. Do not share this medicine with others. What if I miss a dose? Keep appointments for follow-up doses. It is important not to miss your dose. Call your care team if you are unable to keep an appointment. What may interact with this medication? Certain types of chemotherapy, such as daunorubicin, doxorubicin, epirubicin, idarubicin This list may not describe all possible interactions. Give your health care provider a list of all the medicines, herbs, non-prescription drugs, or dietary supplements you use. Also tell them if you smoke, drink alcohol, or use illegal drugs. Some items may interact with your medicine. What should I watch for while using this medication? Your condition will be monitored carefully while you are receiving this medication. This medication may make you feel generally unwell. This is not uncommon as chemotherapy can affect healthy cells as well as cancer cells. Report any side effects. Continue your course of treatment even though you feel ill unless your care team tells you to stop. This medication may increase your risk of getting an infection. Call your care team for advice if you get a fever, chills, sore throat, or other symptoms of a cold or flu. Do not treat yourself. Try to avoid being around people who are sick. Avoid taking medications that contain aspirin, acetaminophen ,  ibuprofen, naproxen, or ketoprofen unless instructed by your care team. These medications may hide a fever. Talk to your care team if you may be pregnant. Serious birth defects can occur if you take this medication during pregnancy and for 7 months after the last dose. You will need a negative pregnancy test before starting this medication. Contraception is recommended while taking this medication and for 7 months after the last dose. Your care team can help you find the option that works for you. Do not breastfeed while taking this medication and for 7 months after the last dose. What side effects may I notice from receiving this medication? Side effects that you should report to your care team as soon as possible: Allergic reactions or angioedema--skin rash, itching or hives, swelling of the face, eyes, lips, tongue, arms, or legs, trouble swallowing or breathing Dry cough, shortness of breath or trouble breathing Heart failure--shortness of breath, swelling of the ankles, feet, or hands, sudden weight gain, unusual weakness or fatigue Infection--fever, chills, cough, or sore throat Infusion reactions--chest pain, shortness of breath or trouble breathing, feeling faint or lightheaded Side effects that usually do not require medical attention (report these to your care team if they continue or are bothersome): Diarrhea Hair loss Nausea Pain, redness, or irritation at injection site Pain, redness, or swelling with  sores inside the mouth or throat Unusual weakness or fatigue This list may not describe all possible side effects. Call your doctor for medical advice about side effects. You may report side effects to FDA at 1-800-FDA-1088. Where should I keep my medication? This medication is given in a hospital or clinic. It will not be stored at home. NOTE: This sheet is a summary. It may not cover all possible information. If you have questions about this medicine, talk to your doctor, pharmacist,  or health care provider.  2024 Elsevier/Gold Standard (2021-08-26 00:00:00) Zoledronic  Acid Injection (Cancer) What is this medication? ZOLEDRONIC  ACID (ZOE le dron ik AS id) treats high calcium levels in the blood caused by cancer. It may also be used with chemotherapy to treat weakened bones caused by cancer. It works by slowing down the release of calcium from bones. This lowers calcium levels in your blood. It also makes your bones stronger and less likely to break (fracture). It belongs to a group of medications called bisphosphonates. This medicine may be used for other purposes; ask your health care provider or pharmacist if you have questions. COMMON BRAND NAME(S): Zometa , Zometa  Powder What should I tell my care team before I take this medication? They need to know if you have any of these conditions: Dehydration Dental disease Kidney disease Liver disease Low levels of calcium in the blood Lung or breathing disease, such as asthma Receiving steroids, such as dexamethasone  or prednisone An unusual or allergic reaction to zoledronic  acid, other medications, foods, dyes, or preservatives Pregnant or trying to get pregnant Breast-feeding How should I use this medication? This medication is injected into a vein. It is given by your care team in a hospital or clinic setting. Talk to your care team about the use of this medication in children. Special care may be needed. Overdosage: If you think you have taken too much of this medicine contact a poison control center or emergency room at once. NOTE: This medicine is only for you. Do not share this medicine with others. What if I miss a dose? Keep appointments for follow-up doses. It is important not to miss your dose. Call your care team if you are unable to keep an appointment. What may interact with this medication? Certain antibiotics given by injection Diuretics, such as bumetanide, furosemide NSAIDs, medications for pain and  inflammation, such as ibuprofen or naproxen Teriparatide Thalidomide This list may not describe all possible interactions. Give your health care provider a list of all the medicines, herbs, non-prescription drugs, or dietary supplements you use. Also tell them if you smoke, drink alcohol, or use illegal drugs. Some items may interact with your medicine. What should I watch for while using this medication? Visit your care team for regular checks on your progress. It may be some time before you see the benefit from this medication. Some people who take this medication have severe bone, joint, or muscle pain. This medication may also increase your risk for jaw problems or a broken thigh bone. Tell your care team right away if you have severe pain in your jaw, bones, joints, or muscles. Tell you care team if you have any pain that does not go away or that gets worse. Tell your dentist and dental surgeon that you are taking this medication. You should not have major dental surgery while on this medication. See your dentist to have a dental exam and fix any dental problems before starting this medication. Take good care of your teeth  while on this medication. Make sure you see your dentist for regular follow-up appointments. You should make sure you get enough calcium and vitamin D while you are taking this medication. Discuss the foods you eat and the vitamins you take with your care team. Check with your care team if you have severe diarrhea, nausea, and vomiting, or if you sweat a lot. The loss of too much body fluid may make it dangerous for you to take this medication. You may need bloodwork while taking this medication. Talk to your care team if you wish to become pregnant or think you might be pregnant. This medication can cause serious birth defects. What side effects may I notice from receiving this medication? Side effects that you should report to your care team as soon as possible: Allergic  reactions--skin rash, itching, hives, swelling of the face, lips, tongue, or throat Kidney injury--decrease in the amount of urine, swelling of the ankles, hands, or feet Low calcium level--muscle pain or cramps, confusion, tingling, or numbness in the hands or feet Osteonecrosis of the jaw--pain, swelling, or redness in the mouth, numbness of the jaw, poor healing after dental work, unusual discharge from the mouth, visible bones in the mouth Severe bone, joint, or muscle pain Side effects that usually do not require medical attention (report to your care team if they continue or are bothersome): Constipation Fatigue Fever Loss of appetite Nausea Stomach pain This list may not describe all possible side effects. Call your doctor for medical advice about side effects. You may report side effects to FDA at 1-800-FDA-1088. Where should I keep my medication? This medication is given in a hospital or clinic. It will not be stored at home. NOTE: This sheet is a summary. It may not cover all possible information. If you have questions about this medicine, talk to your doctor, pharmacist, or health care provider.  2024 Elsevier/Gold Standard (2021-06-03 00:00:00)

## 2023-08-28 ENCOUNTER — Other Ambulatory Visit: Payer: Self-pay

## 2023-08-30 ENCOUNTER — Ambulatory Visit: Payer: Commercial Managed Care - PPO

## 2023-08-31 ENCOUNTER — Inpatient Hospital Stay: Payer: Commercial Managed Care - PPO | Attending: Hematology

## 2023-08-31 ENCOUNTER — Encounter: Payer: Self-pay | Admitting: Hematology

## 2023-08-31 VITALS — BP 119/81 | HR 76 | Temp 98.2°F | Resp 18 | Ht 67.0 in | Wt 207.5 lb

## 2023-08-31 DIAGNOSIS — Z170 Estrogen receptor positive status [ER+]: Secondary | ICD-10-CM | POA: Diagnosis not present

## 2023-08-31 DIAGNOSIS — Z1731 Human epidermal growth factor receptor 2 positive status: Secondary | ICD-10-CM | POA: Diagnosis not present

## 2023-08-31 DIAGNOSIS — Z5111 Encounter for antineoplastic chemotherapy: Secondary | ICD-10-CM | POA: Insufficient documentation

## 2023-08-31 DIAGNOSIS — C7951 Secondary malignant neoplasm of bone: Secondary | ICD-10-CM | POA: Insufficient documentation

## 2023-08-31 DIAGNOSIS — Z1721 Progesterone receptor positive status: Secondary | ICD-10-CM | POA: Insufficient documentation

## 2023-08-31 DIAGNOSIS — C50411 Malignant neoplasm of upper-outer quadrant of right female breast: Secondary | ICD-10-CM | POA: Diagnosis not present

## 2023-08-31 MED ORDER — ACETAMINOPHEN 325 MG PO TABS
650.0000 mg | ORAL_TABLET | Freq: Once | ORAL | Status: AC
  Administered 2023-08-31: 650 mg via ORAL
  Filled 2023-08-31: qty 2

## 2023-08-31 MED ORDER — PERTUZ-TRASTUZ-HYALURON-ZZXF 60-60-2000 MG-MG-U/ML CHEMO ~~LOC~~ SOLN
10.0000 mL | Freq: Once | SUBCUTANEOUS | Status: AC
  Administered 2023-08-31: 10 mL via SUBCUTANEOUS
  Filled 2023-08-31: qty 10

## 2023-08-31 MED ORDER — DIPHENHYDRAMINE HCL 25 MG PO CAPS
50.0000 mg | ORAL_CAPSULE | Freq: Once | ORAL | Status: AC
Start: 2023-08-31 — End: 2023-08-31
  Administered 2023-08-31: 50 mg via ORAL
  Filled 2023-08-31: qty 2

## 2023-08-31 NOTE — Patient Instructions (Signed)
 [facility] - A DEPT OF [facility]  Discharge Instructions: Thank you for choosing [facility] to provide your oncology and hematology care.   If you have a lab appointment with the Cancer Center, please go directly to the Cancer Center and check in at the registration area.   Wear comfortable clothing and clothing appropriate for easy access to any Portacath or PICC line.   We strive to give you quality time with your provider. You may need to reschedule your appointment if you arrive late (15 or more minutes).  Arriving late affects you and other patients whose appointments are after yours.  Also, if you miss three or more appointments without notifying the office, you may be dismissed from the clinic at the provider's discretion.      For prescription refill requests, have your pharmacy contact our office and allow 72 hours for refills to be completed.    Today you received the following chemotherapy and/or immunotherapy agents Phesgo .  Pertuzumab ; Trastuzumab ; Hyaluronidase Injection What is this medication? PERTUZUMAB ; TRASTUZUMAB ; HYALURONIDASE (per TOOZ ue mab; tras TOO zoo mab; hye al ur ON i dase) treats breast cancer. Pertuzumab  and trastuzumab  work by blocking a protein that causes cancer cells to grow and multiply. This helps to slow or stop the spread of cancer cells. Hyaluronidase works by increasing the absorption of other medications in the body to help them work better. It is a combination medication that contains two monoclonal antibodies. This medicine may be used for other purposes; ask your health care provider or pharmacist if you have questions. COMMON BRAND NAME(S): PHESGO  What should I tell my care team before I take this medication? They need to know if you have any of these conditions: Heart failure High blood pressure Irregular heartbeat or rhythm Lung disease An unusual or allergic reaction to pertuzumab ,  trastuzumab , hyaluronidase, other medications, foods, dyes, or preservatives Pregnant or trying to get pregnant Breast-feeding How should I use this medication? This medication is injected under the skin. It is usually given by your care team in a hospital or clinic setting. It may also be given at home by your care team. Talk to your care team about the use of this medication in children. Special care may be needed. Overdosage: If you think you have taken too much of this medicine contact a poison control center or emergency room at once. NOTE: This medicine is only for you. Do not share this medicine with others. What if I miss a dose? Keep appointments for follow-up doses. It is important not to miss your dose. Call your care team if you are unable to keep an appointment. What may interact with this medication? Certain types of chemotherapy, such as daunorubicin, doxorubicin, epirubicin, idarubicin This list may not describe all possible interactions. Give your health care provider a list of all the medicines, herbs, non-prescription drugs, or dietary supplements you use. Also tell them if you smoke, drink alcohol, or use illegal drugs. Some items may interact with your medicine. What should I watch for while using this medication? Your condition will be monitored carefully while you are receiving this medication. This medication may make you feel generally unwell. This is not uncommon as chemotherapy can affect healthy cells as well as cancer cells. Report any side effects. Continue your course of treatment even though you feel ill unless your care team tells you to stop. This medication may increase your risk of getting an infection. Call  your care team for advice if you get a fever, chills, sore throat, or other symptoms of a cold or flu. Do not treat yourself. Try to avoid being around people who are sick. Avoid taking medications that contain aspirin, acetaminophen , ibuprofen, naproxen, or  ketoprofen unless instructed by your care team. These medications may hide a fever. Talk to your care team if you may be pregnant. Serious birth defects can occur if you take this medication during pregnancy and for 7 months after the last dose. You will need a negative pregnancy test before starting this medication. Contraception is recommended while taking this medication and for 7 months after the last dose. Your care team can help you find the option that works for you. Do not breastfeed while taking this medication and for 7 months after the last dose. What side effects may I notice from receiving this medication? Side effects that you should report to your care team as soon as possible: Allergic reactions or angioedema--skin rash, itching or hives, swelling of the face, eyes, lips, tongue, arms, or legs, trouble swallowing or breathing Dry cough, shortness of breath or trouble breathing Heart failure--shortness of breath, swelling of the ankles, feet, or hands, sudden weight gain, unusual weakness or fatigue Infection--fever, chills, cough, or sore throat Infusion reactions--chest pain, shortness of breath or trouble breathing, feeling faint or lightheaded Side effects that usually do not require medical attention (report these to your care team if they continue or are bothersome): Diarrhea Hair loss Nausea Pain, redness, or irritation at injection site Pain, redness, or swelling with sores inside the mouth or throat Unusual weakness or fatigue This list may not describe all possible side effects. Call your doctor for medical advice about side effects. You may report side effects to FDA at 1-800-FDA-1088. Where should I keep my medication? This medication is given in a hospital or clinic. It will not be stored at home. NOTE: This sheet is a summary. It may not cover all possible information. If you have questions about this medicine, talk to your doctor, pharmacist, or health care  provider.  2024 Elsevier/Gold Standard (2021-08-26 00:00:00)      To help prevent nausea and vomiting after your treatment, we encourage you to take your nausea medication as directed.  BELOW ARE SYMPTOMS THAT SHOULD BE REPORTED IMMEDIATELY: *FEVER GREATER THAN 100.4 F (38 C) OR HIGHER *CHILLS OR SWEATING *NAUSEA AND VOMITING THAT IS NOT CONTROLLED WITH YOUR NAUSEA MEDICATION *UNUSUAL SHORTNESS OF BREATH *UNUSUAL BRUISING OR BLEEDING *URINARY PROBLEMS (pain or burning when urinating, or frequent urination) *BOWEL PROBLEMS (unusual diarrhea, constipation, pain near the anus) TENDERNESS IN MOUTH AND THROAT WITH OR WITHOUT PRESENCE OF ULCERS (sore throat, sores in mouth, or a toothache) UNUSUAL RASH, SWELLING OR PAIN  UNUSUAL VAGINAL DISCHARGE OR ITCHING   Items with * indicate a potential emergency and should be followed up as soon as possible or go to the Emergency Department if any problems should occur.  Please show the CHEMOTHERAPY ALERT CARD or IMMUNOTHERAPY ALERT CARD at check-in to the Emergency Department and triage nurse.  Should you have questions after your visit or need to cancel or reschedule your appointment, please contact [facility]  Dept: [phone]  and follow the prompts.  Office hours are 8:00 a.m. to 4:30 p.m. Monday - Friday. Please note that voicemails left after 4:00 p.m. may not be returned until the following business day.  We are closed weekends  and major holidays. You have access to a nurse at all times for urgent questions. Please call the main number to the clinic Dept: [phone] and follow the prompts.   For any non-urgent questions, you may also contact your provider using MyChart. We now offer e-Visits for anyone 32 and older to request care online for non-urgent symptoms. For details visit [email or web address].   Also download the MyChart app! Go to the app store, search "MyChart", open the  app, select [place], and log in with your MyChart username and password.

## 2023-08-31 NOTE — Progress Notes (Signed)
 Patient presents today for chemotherapy injection of Phesgo . Patient is in satisfactory condition with no new complaints voiced.  Vital signs are stable. VS within treatment parameters.  We will proceed with treatment per MD orders.

## 2023-09-02 ENCOUNTER — Other Ambulatory Visit: Payer: Self-pay | Admitting: Nurse Practitioner

## 2023-09-20 ENCOUNTER — Other Ambulatory Visit: Payer: Commercial Managed Care - PPO

## 2023-09-20 ENCOUNTER — Ambulatory Visit: Payer: Commercial Managed Care - PPO | Admitting: Nurse Practitioner

## 2023-09-20 ENCOUNTER — Ambulatory Visit: Payer: Commercial Managed Care - PPO

## 2023-09-21 ENCOUNTER — Encounter: Payer: Self-pay | Admitting: Hematology

## 2023-09-21 ENCOUNTER — Inpatient Hospital Stay: Payer: Commercial Managed Care - PPO | Admitting: Nurse Practitioner

## 2023-09-21 ENCOUNTER — Inpatient Hospital Stay: Payer: Commercial Managed Care - PPO

## 2023-09-21 VITALS — BP 138/88 | HR 97 | Temp 97.8°F | Resp 17 | Wt 212.0 lb

## 2023-09-21 DIAGNOSIS — Z170 Estrogen receptor positive status [ER+]: Secondary | ICD-10-CM

## 2023-09-21 DIAGNOSIS — C50411 Malignant neoplasm of upper-outer quadrant of right female breast: Secondary | ICD-10-CM | POA: Diagnosis not present

## 2023-09-21 DIAGNOSIS — Z5111 Encounter for antineoplastic chemotherapy: Secondary | ICD-10-CM | POA: Diagnosis not present

## 2023-09-21 LAB — CBC WITH DIFFERENTIAL (CANCER CENTER ONLY)
Abs Immature Granulocytes: 0.01 10*3/uL (ref 0.00–0.07)
Basophils Absolute: 0 10*3/uL (ref 0.0–0.1)
Basophils Relative: 0 %
Eosinophils Absolute: 0.2 10*3/uL (ref 0.0–0.5)
Eosinophils Relative: 3 %
HCT: 38.9 % (ref 36.0–46.0)
Hemoglobin: 12.7 g/dL (ref 12.0–15.0)
Immature Granulocytes: 0 %
Lymphocytes Relative: 45 %
Lymphs Abs: 3.3 10*3/uL (ref 0.7–4.0)
MCH: 29 pg (ref 26.0–34.0)
MCHC: 32.6 g/dL (ref 30.0–36.0)
MCV: 88.8 fL (ref 80.0–100.0)
Monocytes Absolute: 0.7 10*3/uL (ref 0.1–1.0)
Monocytes Relative: 9 %
Neutro Abs: 3.2 10*3/uL (ref 1.7–7.7)
Neutrophils Relative %: 43 %
Platelet Count: 341 10*3/uL (ref 150–400)
RBC: 4.38 MIL/uL (ref 3.87–5.11)
RDW: 14.6 % (ref 11.5–15.5)
WBC Count: 7.4 10*3/uL (ref 4.0–10.5)
nRBC: 0 % (ref 0.0–0.2)

## 2023-09-21 LAB — CMP (CANCER CENTER ONLY)
ALT: 13 U/L (ref 0–44)
AST: 16 U/L (ref 15–41)
Albumin: 4.2 g/dL (ref 3.5–5.0)
Alkaline Phosphatase: 98 U/L (ref 38–126)
Anion gap: 3 — ABNORMAL LOW (ref 5–15)
BUN: 15 mg/dL (ref 8–23)
CO2: 28 mmol/L (ref 22–32)
Calcium: 9.1 mg/dL (ref 8.9–10.3)
Chloride: 111 mmol/L (ref 98–111)
Creatinine: 0.86 mg/dL (ref 0.44–1.00)
GFR, Estimated: >60 mL/min (ref >60)
Glucose, Bld: 115 mg/dL — ABNORMAL HIGH (ref 70–99)
Potassium: 3.8 mmol/L (ref 3.5–5.1)
Sodium: 142 mmol/L (ref 135–145)
Total Bilirubin: 0.4 mg/dL (ref 0.0–1.2)
Total Protein: 7.2 g/dL (ref 6.5–8.1)

## 2023-09-21 MED ORDER — SODIUM CHLORIDE 0.9% FLUSH: 3.0000 mL | INTRAVENOUS | Status: DC | PRN

## 2023-09-21 MED ORDER — HEPARIN SOD (PORK) LOCK FLUSH 100 UNIT/ML IV SOLN
250.0000 [IU] | Freq: Once | INTRAVENOUS | Status: DC | PRN
Start: 2023-09-21 — End: 2023-09-21

## 2023-09-21 MED ORDER — ACETAMINOPHEN 325 MG PO TABS
650.0000 mg | ORAL_TABLET | Freq: Once | ORAL | Status: AC
  Administered 2023-09-21: 650 mg via ORAL
  Filled 2023-09-21: qty 2

## 2023-09-21 MED ORDER — PERTUZ-TRASTUZ-HYALURON-ZZXF 60-60-2000 MG-MG-U/ML CHEMO ~~LOC~~ SOLN
10.0000 mL | Freq: Once | SUBCUTANEOUS | Status: AC
  Administered 2023-09-21: 600 mg via SUBCUTANEOUS
  Filled 2023-09-21: qty 10

## 2023-09-21 MED ORDER — HEPARIN SOD (PORK) LOCK FLUSH 100 UNIT/ML IV SOLN
500.0000 [IU] | Freq: Once | INTRAVENOUS | Status: DC | PRN
Start: 2023-09-21 — End: 2023-09-21

## 2023-09-21 MED ORDER — SODIUM CHLORIDE 0.9 % IV SOLN: Freq: Once | INTRAVENOUS | Status: DC

## 2023-09-21 MED ORDER — DIPHENHYDRAMINE HCL 25 MG PO CAPS
50.0000 mg | ORAL_CAPSULE | Freq: Once | ORAL | Status: AC
  Administered 2023-09-21: 50 mg via ORAL
  Filled 2023-09-21: qty 2

## 2023-09-21 MED ORDER — ALTEPLASE 2 MG IJ SOLR
2.0000 mg | Freq: Once | INTRAMUSCULAR | Status: DC | PRN
Start: 2023-09-21 — End: 2023-09-21

## 2023-09-21 MED ORDER — SODIUM CHLORIDE 0.9% FLUSH: 10.0000 mL | INTRAVENOUS | Status: DC | PRN

## 2023-09-21 NOTE — Assessment & Plan Note (Addendum)
 stage IV [phone]), with bone and possible liver mets,  ER+/PR+/HER2+, Grade II-III   --diagnosed in 11/2019 with large right breast mass measuring up to 11cm and right lymphadenopathy. 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 Left axillary node biopsy was benign.  -01/01/20 Liver biopsy was negative but 01/12/20 Bone Biopsy was positive for metastatic carcinoma from primary breast cancer. ER/PR/HER2 were all negative, possibly related to decalcification. -She received THP 01/03/20 - 2/3/2 -on maintenance Herceptin  and Phesgo  injection every 3 weeks -repeated CT scan 11/09/2022 showed stable disease in bone and liver, no concerns for new lesion or evidence of progression.  -Echocardiogram done 02/28/2023 showed EF  55 - 60% with no changes from previous study done 08/2022.  -Per review of cardiology notes, next echocardiogram due in May 2025. -Cycle 14 given 04/06/2023, and cycle 15 given 04/27/2023, both given at [facility]. -05/18/2023 -today is cycle 16 day 1 -Diagnostic mammogram due in June 2025. -echocardiogram due in May 2025 and has been ordered by cardiology/oncology  -most recent CT CAP done 07/02/2023. Results show no evidence of metastatic disease in the chest.  Small liver hypodensities are stable.  Metastatic lesions in the skeleton are not well-visualized on this exam.  No further evidence of recurrence or metastatic disease. -Continue Herceptin  and Perjeta  every 3 weeks.  - New echocardiogram scheduled for 10/22/2023. - CT CAP to be scheduled for July 2025. - Next 2 cycles of Phesgo  at [facility].  - 9 weeks -labs quarterly follow-up, or Phesgo  injection.

## 2023-09-21 NOTE — Progress Notes (Signed)
 Per [place] PA, ok to treat with echo from 02/28/23. Pt scheduled for follow up echo

## 2023-09-21 NOTE — Patient Instructions (Signed)
 [facility]  Discharge Instructions: Thank you for choosing [facility] to provide your oncology and hematology care.   If you have a lab appointment with the Cancer Center, please go directly to the Cancer Center and check in at the registration area.   Wear comfortable clothing and clothing appropriate for easy access to any Portacath or PICC line.   We strive to give you quality time with your provider. You may need to reschedule your appointment if you arrive late (15 or more minutes).  Arriving late affects you and other patients whose appointments are after yours.  Also, if you miss three or more appointments without notifying the office, you may be dismissed from the clinic at the provider's discretion.      For prescription refill requests, have your pharmacy contact our office and allow 72 hours for refills to be completed.    Today you received the following chemotherapy and/or immunotherapy agents: Phesgo.      To help prevent nausea and vomiting after your treatment, we encourage you to take your nausea medication as directed.  BELOW ARE SYMPTOMS THAT SHOULD BE REPORTED IMMEDIATELY: *FEVER GREATER THAN 100.4 F (38 C) OR HIGHER *CHILLS OR SWEATING *NAUSEA AND VOMITING THAT IS NOT CONTROLLED WITH YOUR NAUSEA MEDICATION *UNUSUAL SHORTNESS OF BREATH *UNUSUAL BRUISING OR BLEEDING *URINARY PROBLEMS (pain or burning when urinating, or frequent urination) *BOWEL PROBLEMS (unusual diarrhea, constipation, pain near the anus) TENDERNESS IN MOUTH AND THROAT WITH OR WITHOUT PRESENCE OF ULCERS (sore throat, sores in mouth, or a toothache) UNUSUAL RASH, SWELLING OR PAIN  UNUSUAL VAGINAL DISCHARGE OR ITCHING   Items with * indicate a potential emergency and should be followed up as soon as possible or go to the Emergency Department if any problems should occur.  Please show the CHEMOTHERAPY ALERT CARD or IMMUNOTHERAPY  ALERT CARD at check-in to the Emergency Department and triage nurse.  Should you have questions after your visit or need to cancel or reschedule your appointment, please contact [facility] - A DEPT OF [facility]  Dept: [phone]  and follow the prompts.  Office hours are 8:00 a.m. to 4:30 p.m. Monday - Friday. Please note that voicemails left after 4:00 p.m. may not be returned until the following business day.  We are closed weekends and major holidays. You have access to a nurse at all times for urgent questions. Please call the main number to the clinic Dept: [phone] and follow the prompts.   For any non-urgent questions, you may also contact your provider using MyChart. We now offer e-Visits for anyone 2 and older to request care online for non-urgent symptoms. For details visit [email or web address].   Also download the MyChart app! Go to the app store, search "MyChart", open the app, select [place], and log in with your MyChart username and password.

## 2023-09-21 NOTE — Progress Notes (Signed)
 Patient Care Team: Artemio Larry, PA-C as PCP - General (Physician Assistant) Lockie Rima, MD as Consulting Physician (General Surgery) Sonja [place]. Johns, MD as Consulting Physician (Hematology) Johna Myers, MD as Consulting Physician (Radiation Oncology) Auther Bo, RN as Oncology Nurse Navigator Alane Hsu, RN as Oncology Nurse Navigator  Clinic Day:  09/30/2023  Referring physician: Sonja [place], MD  ASSESSMENT & PLAN:   Assessment & Plan: Malignant neoplasm of upper-outer quadrant of right breast in female, estrogen receptor positive (HCC) stage IV Z([id number]), with bone and possible liver mets,  ER+/PR+/HER2+, Grade II-III   --diagnosed in 11/2019 with large right breast mass measuring up to 11cm and right lymphadenopathy. 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 Left axillary node biopsy was benign.  -01/01/20 Liver biopsy was negative but 01/12/20 Bone Biopsy was positive for metastatic carcinoma from primary breast cancer. ER/PR/HER2 were all negative, possibly related to decalcification. -She received THP 01/03/20 - 2/3/2 -on maintenance Herceptin  and Phesgo  injection every 3 weeks -repeated CT scan 11/09/2022 showed stable disease in bone and liver, no concerns for new lesion or evidence of progression.  -Echocardiogram done 02/28/2023 showed EF  55 - 60% with no changes from previous study done 08/2022.  -Per review of cardiology notes, next echocardiogram due in May 2025. -Cycle 14 given 04/06/2023, and cycle 15 given 04/27/2023, both given at [facility]. -05/18/2023 -today is cycle 16 day 1 -Diagnostic mammogram due in June 2025. -echocardiogram due in May 2025 and has been ordered by cardiology/oncology  -most recent CT CAP done 07/02/2023. Results show no evidence of metastatic disease in the chest.  Small liver hypodensities are stable.  Metastatic lesions in the skeleton are not well-visualized on this exam.   No further evidence of recurrence or metastatic disease. -Continue Herceptin  and Perjeta  every 3 weeks.  - New echocardiogram scheduled for 10/22/2023. - CT CAP to be scheduled for July 2025. - Next 2 cycles of Phesgo  at [place].  - 9 weeks -labs quarterly follow-up, or Phesgo  injection.    Peripheral neuropathy Patient has suffered with peripheral neuropathy since taking chemotherapy for breast cancer. Currently taking gabapentin  300 mg every evening. Overall, well-managed with current medication. Will refill as indicated.    Fatigue  Patient reports increased fatigue over the last few weeks. She is back at labs are unremarkable and stable.   Most recent echocardiogram within normal limits. Echocardiogram scheduled for 10/12/2018.  Plan - Labs reviewed. --CBC and CMP are stable and unremarkable --CA 27-29 pending today. - Continue to take letrozole  2.5 mg daily. - Proceed with Phesgo  injection today. - Continue every 3 weeks.  Next 2 injections to be given at [facility].   - CT CAP to be scheduled in July 2025. - Labs, follow-up, and next treatment in 3 weeks.     The patient understands the plans discussed today and is in agreement with them.  She knows to contact our office if she develops concerns prior to her next appointment.  I provided 25 minutes of face-to-face time during this encounter and > 50% was spent counseling as documented under my assessment and plan.    Sharyon Deis, NP  [facility] - A DEPT OF [name] Dept: [phone] Dept Fax: [phone]   Orders Placed This Encounter  Procedures   CBC with Differential (Cancer Center  Only)    Standing Status:   Future    Expected Date:   10/12/2023    Expiration Date:   10/11/2024   CMP (Cancer Center only)    Standing Status:   Future    Expected Date:   10/12/2023     Expiration Date:   10/11/2024   CA 27.29    Standing Status:   Future    Expected Date:   10/12/2023    Expiration Date:   10/11/2024   CBC with Differential (Cancer Center Only)    Standing Status:   Future    Expected Date:   11/02/2023    Expiration Date:   11/01/2024   CMP (Cancer Center only)    Standing Status:   Future    Expected Date:   11/02/2023    Expiration Date:   11/01/2024   CA 27.29    Standing Status:   Future    Expected Date:   11/02/2023    Expiration Date:   11/01/2024      CHIEF COMPLAINT:  CC: right breast cancer, estrogen receptor positive   Current Treatment:  herceptin  and perjeta  every 3 weeks  INTERVAL HISTORY:  Carolyn Stewart is here today for repeat clinical assessment. She was last seen by me on 07/20/2023. She has echocardiogram scheduled for 10/12/2023. New CT CAP should be scheduled for 10/2023.  She reports increased fatigue over the last month.  She much more than normal.  Does have baseline neuropathy in fingertips.  Neuropathy is related to previous treatment with chemotherapy.  Takes gabapentin  200 mg every evening. She denies chest pain, chest pressure, or shortness of breath. She denies headaches or visual disturbances. She denies abdominal pain, nausea, vomiting, or changes in bowel or bladder habits.   She denies fevers or chills. She denies pain. Her appetite is good. Her weight has increased 5 pounds over last 6 weeks.  I have reviewed the past medical history, past surgical history, social history and family history with the patient and they are unchanged from previous note.  ALLERGIES:  has no known allergies.  MEDICATIONS:  Current Outpatient Medications  Medication Sig Dispense Refill   acetaminophen  (TYLENOL ) 500 MG tablet Take 1,000 mg by mouth every 6 (six) hours as needed for moderate pain or headache.     amLODipine  (NORVASC ) 10 MG tablet Take 2 tablets (20 mg total) by mouth daily. 60 tablet 11   Cholecalciferol (VITAMIN D3) 50 MCG (2000 UT)  TABS Take 2,000 Units by mouth daily.      gabapentin  (NEURONTIN ) 100 MG capsule TAKE 2 CAPSULES BY MOUTH AT BEDTIME 60 capsule 2   KLOR-CON  M10 10 MEQ tablet TAKE 1 TABLET BY MOUTH EVERY DAY 90 tablet 1   letrozole  (FEMARA ) 2.5 MG tablet Take 1 tablet (2.5 mg total) by mouth daily. 90 tablet 3   lidocaine -prilocaine  (EMLA ) cream Apply 1 Application topically as needed. 30 g 1   losartan  (COZAAR ) 100 MG tablet Take 1 tablet (100 mg total) by mouth daily. 90 tablet 3   metoprolol  succinate (TOPROL -XL) 100 MG 24 hr tablet TAKE 1 TABLET BY MOUTH EVERY DAY 90 tablet 3   omeprazole (PRILOSEC OTC) 20 MG tablet Take 20 mg by mouth daily.     No current facility-administered medications for this visit.   Facility-Administered Medications Ordered in Other Visits  Medication Dose Route Frequency Provider Last Rate Last Admin   sodium chloride  flush (NS) 0.9 % injection 10 mL  10 mL Intravenous PRN Sonja Rainbow,  MD        HISTORY OF PRESENT ILLNESS:   Oncology History Overview Note  Cancer Staging Malignant neoplasm of upper-outer quadrant of right breast in female, estrogen receptor positive (HCC) Staging form: Breast, AJCC 8th Edition - Clinical stage from 11/25/2019: Stage IIA (cT3, cN1, cM0, G2, ER+, PR+, HER2+) - Signed by Sonja [place], MD on 12/03/2019    Malignant neoplasm of upper-outer quadrant of right breast in female, estrogen receptor positive (HCC)  11/13/2019 Mammogram    Diagnostic Mammogram 11/13/19  IMPRESSION: -Highly suspicious mass and calcifications in the right breast centered between 9 and 12 o'clock spanning up to 11 cm.  -Multiple masses in the right axilla. One of the masses contains calcifications, denoted as mass number 2 measuring 9 x 11 by 10 mm. Several of these masses do not appear to represent definitive lymph nodes. Others may be small but abnormal appearing lymph nodes. Taken in total, a cluster of masses in the right axilla spans up to 3.7 cm.  -There are fibrocystic  changes on the left. There are also some solid-appearing masses. A solid appearing mass at 10:30, 6 cm from the nipple measures 9 x 6 x 6 mm. An adjacent solid mass at 10 o'clock, 6 cm from the nipple measures 8 x 6 by 5 mm. Another solid-appearing mass at 11 o'clock, 1 cm from the nipple measures 5 x 4 by 5 mm. Fibrocystic changes are seen at 10 o'clock, 12 o'clock, and 4 o'clock. Another solid-appearing mass seen at 5 o'clock, 4 cm from the nipple measuring 6 x 3 x 4 mm. There is a single abnormal node in the left axilla with a cortex measuring 5 mm and restriction of the fatty hilum.   11/25/2019 Initial Biopsy   Diagnosis 1. Breast, right, needle core biopsy, upper outer - INVASIVE MAMMARY CARCINOMA - MAMMARY CARCINOMA IN SITU WITH CALCIFICATIONS AND NECROSIS - SEE COMMENT 2. Lymph node, needle/core biopsy, right axilla - INVASIVE MAMMARY CARCINOMA - SEE COMMENT 3. Lymph node, needle/core biopsy, left axilla - BENIGN LYMPH NODE - NO CARCINOMA IDENTIFIED Microscopic Comment 1. The biopsy material shows an infiltrative proliferation of cells arranged linearly and in small clusters. Based on the biopsy, the carcinoma appears Nottingham grade 2 of 3 and measures 1.2 cm in greatest linear extent. E-cadherin and prognostic markers (ER/PR/ki-67/HER2)are pending and will be reported in an addendum. Dr. Secundino Dach reviewed the case and agrees with the above diagnosis. These results were called to [facility] on November 26, 2019. 2. Definitive nodal tissue is not identified. This biopsy has a slightly different architecture morphology than what is seen in part 1. The carcinoma measures 0.7 cm in greatest dimension. E-cadherin and prognostic markers (ER, PR, Ki-67 and HER-2) are pending and will be reported in an addendum.   11/25/2019 Receptors her2   1. PROGNOSTIC INDICATORS Results: IMMUNOHISTOCHEMICAL AND MORPHOMETRIC ANALYSIS PERFORMED MANUALLY The tumor cells are POSITIVE  for Her2 (3+). Estrogen Receptor: 90%, POSITIVE, STRONG STAINING INTENSITY Progesterone Receptor: 20%, POSITIVE, STRONG STAINING INTENSITY Proliferation Marker Ki67: 30%  2. PROGNOSTIC INDICATORS Results: IMMUNOHISTOCHEMICAL AND MORPHOMETRIC ANALYSIS PERFORMED MANUALLY The tumor cells are POSITIVE for Her2 (3+). Estrogen Receptor: 90%, POSITIVE, STRONG STAINING INTENSITY Progesterone Receptor: 25%, POSITIVE, STRONG STAINING INTENSITY Proliferation Marker Ki67: 30%   11/25/2019 Cancer Staging   Staging form: Breast, AJCC 8th Edition - Clinical stage from 11/25/2019: Stage IIA (cT3, cN1, cM0, G2, ER+, PR+, HER2+) - Signed by Sonja [place], MD on 12/03/2019   12/01/2019  Initial Diagnosis   Malignant neoplasm of upper-outer quadrant of right breast in female, estrogen receptor positive (HCC)   12/10/2019 Imaging   Bone Scan  IMPRESSION: Findings concerning for bony metastatic disease in the left acetabulum region, L3 vertebral body region, and anterolateral right tenth rib.   Increased uptake in several large joints likely is of arthropathic etiology.   12/11/2019 Imaging   CT CAP w contrast  IMPRESSION: Prominent glandular tissue in the central right breast with nipple retraction, likely corresponding to the patient's known right breast neoplasm.   Prominent right axillary nodes measuring up to 8 mm short axis, suspicious for nodal metastases in this clinical context.   3 mm subpleural pulmonary nodule in the right lower lobe, likely benign. However, follow-up CT chest is suggested in 3-6 months.   Multiple hepatic lesions, including a dominant 2.4 cm lesion in segment 6, which is considered indeterminate. Metastasis is not excluded. Consider MRI abdomen with/without contrast for further characterization.   11 mm sclerotic lesion along the left posterior aspect of the T7 vertebral body raises concern for isolated osseous metastasis. Correlate with priors if available. Benign  vertebral hemangioma at T11.   12/12/2019 Breast MRI   IMPRESSION: 1. The biopsy proven malignancy in the right breast involves all 4 quadrants and spans up to 9 cm by MRI. There is enhancement within the skin at the level of the nipple.   2. Multiple matted masses are seen in the right axilla, either abnormal metastatic lymph nodes or a combination of masses and metastatic lymph nodes. One of these has been previously biopsied showing invasive mammary carcinoma without definitive nodal tissue.   3.  There is a 1.5 cm Rotter's lymph node on the right.   4. There is markedly abnormal enhancement and edema in the right pectoralis major muscle spanning 6-7 cm, suspicious for metastatic disease.   5. There is diffuse nodular enhancement throughout the left breast, which may correspond with the solid-appearing masses seen on ultrasound. These all have a similar appearance on MRI.   6.  No findings of left axillary lymphadenopathy.   12/15/2019 Procedure   PAC placed    12/24/2019 Imaging   MRI abdomen  IMPRESSION: 1. Multifocal enhancing bone lesions are noted within the lumbar spine, and bony pelvis compatible with metastatic disease. 2. Four, small peripherally enhancing cystic lesions are noted within the liver worrisome for metastatic disease. 3. 2 enhancing liver lesions are also noted with signal and enhancement characteristics consistent with benign liver hemangioma. 4. Well-circumscribed, enhancing T2 and T1 hypointense structure within the right adnexal region is indeterminate. It is unclear whether not this is arising from the right ovary, broad ligament or right side of uterine fundus. Differential considerations include fibrothecoma, versus pedunculated uterine leiomyoma. Metastatic disease is considered less favored. Attention on follow-up imaging is recommended.     01/03/2020 - 05/27/2020 Chemotherapy   First line THP q3weeks starting 01/03/20-05/27/20   01/05/2020  Imaging   US  Abdomen  IMPRESSION: 1. Suspect hematoma in the medial right lobe of the liver in the area of previous biopsy. Note that recent MR does not show lesion in this area. This area has ill-defined margins and measures 3.9 x 4.0 x 3.5 cm. No perihepatic fluid.   2. Probable hemangiomas elsewhere in the right lobe of the liver. Note that several smaller lesions seen on recent MR are not appreciable by ultrasound.   3.  Study otherwise unremarkable.   01/12/2020 Pathology Results   FINAL MICROSCOPIC  DIAGNOSIS:   A. BONE, SCLEROTIC LESION, LEFT ANTERIOR ILIAC SPINE, BIOPSY:  - Metastatic carcinoma, consistent with patient's clinical history of  primary breast carcinoma  - See comment   COMMENT:  Immunohistochemical stains show that the tumor cells are positive for  CK7 and GATA3; and negative for CK20, consistent with above  interpretation.   PROGNOSTIC INDICATOR RESULTS:  The tumor cells are NEGATIVE for Her2 (0); see comment  Estrogen Receptor: NEGATIVE; see comment  Progesterone Receptor: NEGATIVE; see comment    03/23/2020 Imaging   CT CAP IMPRESSION: 1. Right lower lobe perifissural nodule is slightly more conspicuous on today's study. Close attention on follow-up recommended. 2. Interval progression of segment IV liver lesion, concerning for metastatic progression. The remaining visualized liver lesions are stable to smaller in the interval. 3. Interval evolution in appearance of thoracolumbar spinal lesions and left iliac crest lesion, potentially reflecting response to therapy/healing. No definite new osseous lesions on today's study. 4. New small fluid collection right cul-de-sac. 5. Probable fibroid change in the uterus including exophytic right fundal lesion. 6. Aortic Atherosclerosis (ICD10-I70.0).   03/31/2020 Imaging   MR ABD IMPRESSION: 1. Enlarging hepatic lesion in hepatic subsegment IV suspicious for hepatic metastasis. The it is uncertain whether  the change in the short interval is due to technical factors with different modalities or true enlargement. 2. Lesion in the posterior RIGHT hepatic lobe with imaging features that are atypical for hemangioma but stability and signs of enhancement on later phase raising this question. Another more classic appearing may angioma seen inferior to this location in hepatic subsegment VI. Attention on follow-up. 3. Heterogeneous enhancement throughout the spine particularly at L3 as exhibited on the prior study with very patchy marrow signal remains suspicious for bony metastatic lesions in this patient with known bone metastases.   04/01/2020 Imaging   Bone scan IMPRESSION: Good response to therapy with resolution of sites of uptake seen previously at the cervical spine and posterior RIGHT tenth rib as well as a diminished degree of uptake at remaining previously identified osseous metastatic foci.   04/28/2020 PET scan   IMPRESSION: 1. No hypermetabolic lesions to suggest active metastatic disease involving the liver or osseous structures. 2. Stable small low-attenuation liver lesions, likely treated disease.   06/18/2020 -  Chemotherapy   Given her excellent response, I switched to maintenance therapy with Herceptin , Perjeta  (Phesgo ) q3weeks and Letrozole .   06/18/2020 -  Chemotherapy   Zometa  starting 06/18/20   06/18/2020 -  Anti-estrogen oral therapy   Letrozole  2.5mg  once dialy.    06/18/2020 - 06/16/2022 Chemotherapy   Patient is on Treatment Plan : BREAST Trastuzumab  + Pertuzumab  q21d     09/03/2020 Imaging   Bone Scan IMPRESSION: 1. Significant interval improvement compared to previous studies. Only mild uptake remains in the pelvis as above. There has been significant interval improvement over time.  CT C/A/P IMPRESSION: 1. Hypodense liver lesions are slightly decreased in size compared to prior examination, consistent with treatment response of hepatic metastatic  disease. 2. Unchanged sclerotic lesion of the anterior left iliac crest. Unchanged lytic superior endplate deformity of L3, which remains equivocal for metastatic lesion versus mechanical Schmorl deformity. No CT evidence of new osseous metastatic disease. 3. No evidence of new metastatic disease in the chest, abdomen, or pelvis. 4. Coronary artery disease.   05/05/2022 Imaging    IMPRESSION: 1. Interval decreased size of the largest hepatic metastatic lesion with stable size of the additional subcentimeter bilobar hepatic lesions. Continued decrease  in size of the bilobar hepatic metastases. 2. Similar appearance of the osseous metatatic disease. No new aggressive lytic or blastic lesion of bone. 3. No evidence of new or progressive metastatic disease in the chest, abdomen or pelvis.     05/05/2022 Imaging    IMPRESSION: Faint residual radiotracer uptake in the left hemipelvis.   New focus of mild abnormal radiotracer uptake in the right lateral seventh rib without discrete CT correlate on same day examination, nonspecific suggest correlation with history of interval trauma.     07/07/2022 -  Chemotherapy   Patient is on Treatment Plan : BREAST Trastuzumab  + Pertuzumab  q21d     02/28/2023 Echocardiogram   IMPRESSIONS   1. Left ventricular ejection fraction, by estimation, is 55 to 60%. The left ventricle has normal function. The left ventricle has no regional wall motion abnormalities. Left ventricular diastolic parameters are consistent with Grade I diastolic  dysfunction (impaired relaxation). The average left ventricular global longitudinal strain is -17.1 %. The global longitudinal strain is normal.   2. Right ventricular systolic function is normal. The right ventricular size is normal. There is normal pulmonary artery systolic pressure. The estimated right ventricular systolic pressure is 28.8 mmHg.   3. The mitral valve is normal in structure. No evidence of mitral valve  regurgitation. No evidence of mitral stenosis.   4. The aortic valve is tricuspid. Aortic valve regurgitation is trivial. No aortic stenosis is present.   5. The inferior vena cava is normal in size with greater than 50% respiratory variability, suggesting right atrial pressure of 3 mmHg.  Comparison(s): No significant change from prior study. Prior GLS: -18.0.      07/02/2023 Imaging   CT CAP with contrast  IMPRESSION: CHEST:  No evidence of breast cancer metastasis in the chest.  PELVIS:  1. No evidence recurrent breast cancer within liver. Stable small hypodensities. 2. Previous described metastatic skeletal lesions are not well appreciated. 3. No new or progressive disease.       REVIEW OF SYSTEMS:   Constitutional: Denies fevers, chills or abnormal weight loss. Increased fatigue, much more so than normal.  Eyes: Denies blurriness of vision Ears, nose, mouth, throat, and face: Denies mucositis or sore throat Respiratory: Denies cough, dyspnea or wheezes Cardiovascular: Denies palpitation, chest discomfort or lower extremity swelling Gastrointestinal:  Denies nausea, heartburn or change in bowel habits Skin: Denies abnormal skin rashes Lymphatics: Denies new lymphadenopathy or easy bruising Neurological:Denies numbness, tingling or new weaknesses Behavioral/Psych: Mood is stable, no new changes  All other systems were reviewed with the patient and are negative.   VITALS:   Today's Vitals   09/21/23 1238  BP: 138/88  Pulse: 97  Resp: 17  Temp: 97.8 F (36.6 C)  SpO2: 98%  Weight: 212 lb (96.2 kg)   Body mass index is 33.2 kg/m.   Wt Readings from Last 3 Encounters:  09/21/23 212 lb (96.2 kg)  08/31/23 207 lb 8 oz (94.1 kg)  08/10/23 208 lb 8 oz (94.6 kg)    Body mass index is 33.2 kg/m.  Performance status (ECOG): 1 - Symptomatic but completely ambulatory  PHYSICAL EXAM:   GENERAL:alert, no distress and comfortable SKIN: skin color, texture, turgor are  normal, no rashes or significant lesions EYES: normal, Conjunctiva are pink and non-injected, sclera clear OROPHARYNX:no exudate, no erythema and lips, buccal mucosa, and tongue normal  NECK: supple, thyroid normal size, non-tender, without nodularity LYMPH:  no palpable lymphadenopathy in the cervical, axillary or inguinal LUNGS: clear  to auscultation and percussion with normal breathing effort HEART: regular rate & rhythm and no murmurs and no lower extremity edema ABDOMEN:abdomen soft, non-tender and normal bowel sounds Musculoskeletal:no cyanosis of digits and no clubbing  NEURO: alert & oriented x 3 with fluent speech, no focal motor/sensory deficits  LABORATORY DATA:  I have reviewed the data as listed    Component Value Date/Time   NA 142 09/21/2023 1226   K 3.8 09/21/2023 1226   CL 111 09/21/2023 1226   CO2 28 09/21/2023 1226   GLUCOSE 115 (H) 09/21/2023 1226   BUN 15 09/21/2023 1226   CREATININE 0.86 09/21/2023 1226   CALCIUM 9.1 09/21/2023 1226   PROT 7.2 09/21/2023 1226   ALBUMIN 4.2 09/21/2023 1226   AST 16 09/21/2023 1226   ALT 13 09/21/2023 1226   ALKPHOS 98 09/21/2023 1226   BILITOT 0.4 09/21/2023 1226   GFRNONAA >60 09/21/2023 1226   GFRAA >60 01/22/2020 0824     Lab Results  Component Value Date   WBC 7.4 09/21/2023   NEUTROABS 3.2 09/21/2023   HGB 12.7 09/21/2023   HCT 38.9 09/21/2023   MCV 88.8 09/21/2023   PLT 341 09/21/2023

## 2023-09-22 LAB — CANCER ANTIGEN 27.29: CA 27.29: 42.8 U/mL — ABNORMAL HIGH (ref 0.0–38.6)

## 2023-09-24 ENCOUNTER — Telehealth: Payer: Self-pay | Admitting: Nurse Practitioner

## 2023-09-24 NOTE — Telephone Encounter (Signed)
 Patient has been scheduled for follow-up visit per 09/24/23 LOS.  LVM notifying pt of appt details, provided my direct number to pt if appt changes need to be made.

## 2023-09-25 ENCOUNTER — Encounter: Payer: Self-pay | Admitting: Hematology

## 2023-09-25 ENCOUNTER — Other Ambulatory Visit: Payer: Self-pay

## 2023-09-25 ENCOUNTER — Encounter: Payer: Self-pay | Admitting: Nurse Practitioner

## 2023-09-30 ENCOUNTER — Encounter: Payer: Self-pay | Admitting: Nurse Practitioner

## 2023-09-30 ENCOUNTER — Encounter: Payer: Self-pay | Admitting: Hematology

## 2023-10-05 ENCOUNTER — Other Ambulatory Visit: Payer: Self-pay

## 2023-10-11 ENCOUNTER — Telehealth (HOSPITAL_COMMUNITY): Payer: Self-pay

## 2023-10-11 NOTE — Telephone Encounter (Signed)
 Called to confirm/remind patient of [facility] on 10/12[facility].   Appointment:   [x] Confirmed  [] Left mess   [] No answer/No voice mail  [] VM Full/unable to leave message  [] Phone not in service  Patient reminded to bring all medications and/or complete list.  Confirmed patient has transportation. Gave directions, instructed to utilize valet parking.

## 2023-10-12 ENCOUNTER — Ambulatory Visit (HOSPITAL_COMMUNITY)
Admission: RE | Admit: 2023-10-12 | Discharge: 2023-10-12 | Disposition: A | Discharge status: Home / Self Care | Source: Ambulatory Visit | Attending: Family Medicine | Admitting: Family Medicine

## 2023-10-12 ENCOUNTER — Ambulatory Visit (HOSPITAL_BASED_OUTPATIENT_CLINIC_OR_DEPARTMENT_OTHER)
Admission: RE | Admit: 2023-10-12 | Discharge: 2023-10-12 | Disposition: A | Discharge status: Home / Self Care | Source: Ambulatory Visit | Attending: Cardiology | Admitting: Cardiology

## 2023-10-12 ENCOUNTER — Inpatient Hospital Stay: Attending: Hematology

## 2023-10-12 ENCOUNTER — Encounter: Payer: Self-pay | Admitting: Hematology

## 2023-10-12 ENCOUNTER — Encounter (HOSPITAL_COMMUNITY): Payer: Self-pay

## 2023-10-12 VITALS — BP 132/84 | HR 75 | Ht 67.0 in | Wt 210.4 lb

## 2023-10-12 VITALS — BP 107/63 | HR 72 | Temp 97.8°F | Resp 17 | Ht 67.0 in | Wt 210.0 lb

## 2023-10-12 DIAGNOSIS — Z1721 Progesterone receptor positive status: Secondary | ICD-10-CM | POA: Diagnosis not present

## 2023-10-12 DIAGNOSIS — Z1731 Human epidermal growth factor receptor 2 positive status: Secondary | ICD-10-CM | POA: Insufficient documentation

## 2023-10-12 DIAGNOSIS — Z170 Estrogen receptor positive status [ER+]: Secondary | ICD-10-CM | POA: Insufficient documentation

## 2023-10-12 DIAGNOSIS — I509 Heart failure, unspecified: Secondary | ICD-10-CM | POA: Diagnosis not present

## 2023-10-12 DIAGNOSIS — I427 Cardiomyopathy due to drug and external agent: Secondary | ICD-10-CM | POA: Insufficient documentation

## 2023-10-12 DIAGNOSIS — Z79811 Long term (current) use of aromatase inhibitors: Secondary | ICD-10-CM | POA: Insufficient documentation

## 2023-10-12 DIAGNOSIS — C50411 Malignant neoplasm of upper-outer quadrant of right female breast: Secondary | ICD-10-CM | POA: Insufficient documentation

## 2023-10-12 DIAGNOSIS — T451X5A Adverse effect of antineoplastic and immunosuppressive drugs, initial encounter: Secondary | ICD-10-CM | POA: Insufficient documentation

## 2023-10-12 DIAGNOSIS — I10 Essential (primary) hypertension: Secondary | ICD-10-CM | POA: Insufficient documentation

## 2023-10-12 DIAGNOSIS — Z5111 Encounter for antineoplastic chemotherapy: Secondary | ICD-10-CM | POA: Insufficient documentation

## 2023-10-12 DIAGNOSIS — T451X5D Adverse effect of antineoplastic and immunosuppressive drugs, subsequent encounter: Secondary | ICD-10-CM | POA: Diagnosis not present

## 2023-10-12 DIAGNOSIS — C7951 Secondary malignant neoplasm of bone: Secondary | ICD-10-CM | POA: Insufficient documentation

## 2023-10-12 DIAGNOSIS — C50911 Malignant neoplasm of unspecified site of right female breast: Secondary | ICD-10-CM | POA: Insufficient documentation

## 2023-10-12 LAB — ECHOCARDIOGRAM COMPLETE
Area-P 1/2: 3.68 cm2
Height: 67 in
S' Lateral: 3.2 cm
Weight: 3360 [oz_av]

## 2023-10-12 MED ORDER — PERTUZ-TRASTUZ-HYALURON-ZZXF 60-60-2000 MG-MG-U/ML CHEMO ~~LOC~~ SOLN
10.0000 mL | Freq: Once | SUBCUTANEOUS | Status: AC
  Administered 2023-10-12: 600 mg via SUBCUTANEOUS
  Filled 2023-10-12: qty 10

## 2023-10-12 MED ORDER — ACETAMINOPHEN 325 MG PO TABS
650.0000 mg | ORAL_TABLET | Freq: Once | ORAL | Status: AC
  Administered 2023-10-12: 650 mg via ORAL
  Filled 2023-10-12: qty 2

## 2023-10-12 MED ORDER — DIPHENHYDRAMINE HCL 25 MG PO CAPS
50.0000 mg | ORAL_CAPSULE | Freq: Once | ORAL | Status: AC
  Administered 2023-10-12: 50 mg via ORAL
  Filled 2023-10-12: qty 2

## 2023-10-12 NOTE — Patient Instructions (Signed)
 [facility] - A DEPT OF [facility]   Discharge Instructions: Thank you for choosing [facility] to provide your oncology and hematology care.   If you have a lab appointment with the Cancer Center, please go directly to the Cancer Center and check in at the registration area.   Wear comfortable clothing and clothing appropriate for easy access to any Portacath or PICC line.   We strive to give you quality time with your provider. You may need to reschedule your appointment if you arrive late (15 or more minutes).  Arriving late affects you and other patients whose appointments are after yours.  Also, if you miss three or more appointments without notifying the office, you may be dismissed from the clinic at the provider's discretion.      For prescription refill requests, have your pharmacy contact our office and allow 72 hours for refills to be completed.    Today you received the following chemotherapy and/or immunotherapy agents PHESGO       To help prevent nausea and vomiting after your treatment, we encourage you to take your nausea medication as directed.  BELOW ARE SYMPTOMS THAT SHOULD BE REPORTED IMMEDIATELY: *FEVER GREATER THAN 100.4 F (38 C) OR HIGHER *CHILLS OR SWEATING *NAUSEA AND VOMITING THAT IS NOT CONTROLLED WITH YOUR NAUSEA MEDICATION *UNUSUAL SHORTNESS OF BREATH *UNUSUAL BRUISING OR BLEEDING *URINARY PROBLEMS (pain or burning when urinating, or frequent urination) *BOWEL PROBLEMS (unusual diarrhea, constipation, pain near the anus) TENDERNESS IN MOUTH AND THROAT WITH OR WITHOUT PRESENCE OF ULCERS (sore throat, sores in mouth, or a toothache) UNUSUAL RASH, SWEPertuzumab; Trastuzumab ; Hyaluronidase Injection What is this medication? PERTUZUMAB ; TRASTUZUMAB ; HYALURONIDASE (per TOOZ ue mab; tras TOO zoo mab; hye al ur ON i dase) treats breast cancer. Pertuzumab  and trastuzumab  work by blocking a protein that causes cancer cells  to grow and multiply. This helps to slow or stop the spread of cancer cells. Hyaluronidase works by increasing the absorption of other medications in the body to help them work better. It is a combination medication that contains two monoclonal antibodies. This medicine may be used for other purposes; ask your health care provider or pharmacist if you have questions. COMMON BRAND NAME(S): PHESGO  What should I tell my care team before I take this medication? They need to know if you have any of these conditions: Heart failure High blood pressure Irregular heartbeat or rhythm Lung disease An unusual or allergic reaction to pertuzumab , trastuzumab , hyaluronidase, other medications, foods, dyes, or preservatives Pregnant or trying to get pregnant Breast-feeding How should I use this medication? This medication is injected under the skin. It is usually given by your care team in a hospital or clinic setting. It may also be given at home by your care team. Talk to your care team about the use of this medication in children. Special care may be needed. Overdosage: If you think you have taken too much of this medicine contact a poison control center or emergency room at once. NOTE: This medicine is only for you. Do not share this medicine with others. What if I miss a dose? Keep appointments for follow-up doses. It is important not to miss your dose. Call your care team if you are unable to keep an appointment. What may interact with this medication? Certain types of chemotherapy, such as daunorubicin, doxorubicin, epirubicin, idarubicin This list may not describe all possible interactions. Give your health care provider a list of all the medicines,  herbs, non-prescription drugs, or dietary supplements you use. Also tell them if you smoke, drink alcohol, or use illegal drugs. Some items may interact with your medicine. What should I watch for while using this medication? Your condition will be  monitored carefully while you are receiving this medication. This medication may make you feel generally unwell. This is not uncommon as chemotherapy can affect healthy cells as well as cancer cells. Report any side effects. Continue your course of treatment even though you feel ill unless your care team tells you to stop. This medication may increase your risk of getting an infection. Call your care team for advice if you get a fever, chills, sore throat, or other symptoms of a cold or flu. Do not treat yourself. Try to avoid being around people who are sick. Avoid taking medications that contain aspirin, acetaminophen , ibuprofen, naproxen, or ketoprofen unless instructed by your care team. These medications may hide a fever. Talk to your care team if you may be pregnant. Serious birth defects can occur if you take this medication during pregnancy and for 7 months after the last dose. You will need a negative pregnancy test before starting this medication. Contraception is recommended while taking this medication and for 7 months after the last dose. Your care team can help you find the option that works for you. Do not breastfeed while taking this medication and for 7 months after the last dose. What side effects may I notice from receiving this medication? Side effects that you should report to your care team as soon as possible: Allergic reactions or angioedema--skin rash, itching or hives, swelling of the face, eyes, lips, tongue, arms, or legs, trouble swallowing or breathing Dry cough, shortness of breath or trouble breathing Heart failure--shortness of breath, swelling of the ankles, feet, or hands, sudden weight gain, unusual weakness or fatigue Infection--fever, chills, cough, or sore throat Infusion reactions--chest pain, shortness of breath or trouble breathing, feeling faint or lightheaded Side effects that usually do not require medical attention (report these to your care team if they  continue or are bothersome): Diarrhea Hair loss Nausea Pain, redness, or irritation at injection site Pain, redness, or swelling with sores inside the mouth or throat Unusual weakness or fatigue This list may not describe all possible side effects. Call your doctor for medical advice about side effects. You may report side effects to FDA at 1-800-FDA-1088. Where should I keep my medication? This medication is given in a hospital or clinic. It will not be stored at home. NOTE: This sheet is a summary. It may not cover all possible information. If you have questions about this medicine, talk to your doctor, pharmacist, or health care provider.  2024 Elsevier/Gold Standard (2021-08-26 00:00:00)LLING OR PAIN  UNUSUAL VAGINAL DISCHARGE OR ITCHING   Items with * indicate a potential emergency and should be followed up as soon as possible or go to the Emergency Department if any problems should occur.  Please show the CHEMOTHERAPY ALERT CARD or IMMUNOTHERAPY ALERT CARD at check-in to the Emergency Department and triage nurse.  Should you have questions after your visit or need to cancel or reschedule your appointment, please contact [facility] - A DEPT OF [facility]  Dept: [phone]  and follow the prompts.  Office hours are 8:00 a.m. to 4:30 p.m. Monday - Friday. Please note that voicemails left after 4:00 p.m. may not be returned until the following business day.  We are closed weekends and  major holidays. You have access to a nurse at all times for urgent questions. Please call the main number to the clinic Dept: [phone] and follow the prompts.   For any non-urgent questions, you may also contact your provider using MyChart. We now offer e-Visits for anyone 22 and older to request care online for non-urgent symptoms. For details visit [email or web address].   Also download the MyChart app! Go to the app store, search MyChart, open the app, select [facility], and log in with your MyChart username and password.

## 2023-10-12 NOTE — Patient Instructions (Signed)
 Great to see you today!!!  Your physician recommends that you schedule a follow-up appointment in: 6 months with an echocardiogram (December), **PLEASE CALL OUR OFFICE IN OCTOBER TO SCHEDULE THESE APPOINTMENTS  If you have any questions or concerns before your next appointment please send us  a message through [place] or call our office at [phone].    TO LEAVE A MESSAGE FOR THE NURSE SELECT OPTION 2, PLEASE LEAVE A MESSAGE INCLUDING: YOUR NAME DATE OF BIRTH CALL BACK NUMBER REASON FOR CALL**this is important as we prioritize the call backs  YOU WILL RECEIVE A CALL BACK THE SAME DAY AS LONG AS YOU CALL BEFORE 4:00 PM  At the [facility], you and your health needs are our priority. As part of our continuing mission to provide you with exceptional heart care, we have created designated Provider Care Teams. These Care Teams include your primary Cardiologist (physician) and Advanced Practice Providers (APPs- Physician Assistants and Nurse Practitioners) who all work together to provide you with the care you need, when you need it.   You may see any of the following providers on your designated Care Team at your next follow up: Dr Jules Oar Dr Peder Bourdon Dr. Alwin Baars Dr. Arta Lark Amy Marijane Shoulders, NP Ruddy Corral, [place] [facility], [place] Dennise Fitz, NP [place] Lee, NP Shawnee Dellen, NP Luster Salters, PharmD Bevely Brush, PharmD   Please be sure to bring in all your medications bottles to every appointment.    Thank you for choosing [facility]

## 2023-10-12 NOTE — Addendum Note (Signed)
 Encounter addended by: Horace Lye, PA-C on: 10/12/2023 3:37 PM  Actions taken: Clinical Note Signed

## 2023-10-12 NOTE — Progress Notes (Addendum)
 CARDIO-ONCOLOGY NOTE  Referring Physician: Dr. Maryalice Smaller Primary Care: Artemio Larry, PA-C HF Cardiologist: Dr. Julane Ny  Reason for Visit: f/u for cardiotoxic CM   HPI: Carolyn Stewart is 63 y.o. female with HTN and stage IV right breast cancer referred by Dr. Maryalice Smaller for enrollment into the Cardio-Oncology program due to reduced EF on echo.   Diagnosed with R breast cancer in 8/21 (triple positive) found to have mets to bone. She received THP 01/03/20-05/27/20. Her restaging PET scan from 04/28/20 showed no hypermetabolic lesions. Given her excellent response, she was switched to maintenance therapy with Herceptin , Perjeta  q3weeks and Letrozole  starting 06/18/20 which is still ongoing.  Zometa  started 06/18/20.  Echo 09/2019 which showed EF 50-55% so H/P held. Losartan  started.   COVID 10/2019. Received Paxlovid  and recovered without need for hospital admission.  Echo 7/22 with EF 55-60%. Herceptin  restarted.  Echo 9/22 EF 55% felt to be slightly down from before but still in normal range  Echo 03/09/21 EF 55%  Echo 08/31/22 EF 55-60% GLS -18.0%   Echo 02/28/23 EF 55-60%, GLS 17.1%   Today she returns for follow up. Doing well. No complaints. Denies dyspnea or CP. No LEE. No exertional symptoms w/ ADLs. Reports full med compliance. Tolerating well w/o side effects. Home BPs in the 120s systolic.   Echo was done today. Awaiting official MD interpretation.    Cardiac Studies Echo 11/24 EF 55-60%, GLS 17%  Echo 5/24 EF 55-60%, GLS 18%  Echo 6/23: EF 55-60% GLS -22.4% Echo 2/23: EF 55-60% GLS -20% Echo 07/22: EF 55-60% GLS 16 Echo 09/22/20: EF 40-45%  (I felt 50-55%) Echo 12/21: EF 60-65% GLS -18.2 Echo 8/21: EF 60-65% G1DD  Review of Systems: Negative except as mentioned in HPI  Past Medical History:  Diagnosis Date   Cancer (HCC) 2021   Hypertension    Current Outpatient Medications  Medication Sig Dispense Refill   acetaminophen  (TYLENOL ) 500 MG tablet Take 1,000 mg by mouth every 6  (six) hours as needed for moderate pain or headache.     amLODipine  (NORVASC ) 10 MG tablet Take 2 tablets (20 mg total) by mouth daily. 60 tablet 11   Cholecalciferol (VITAMIN D3) 50 MCG (2000 UT) TABS Take 2,000 Units by mouth daily.      gabapentin  (NEURONTIN ) 100 MG capsule TAKE 2 CAPSULES BY MOUTH AT BEDTIME 60 capsule 2   KLOR-CON  M10 10 MEQ tablet TAKE 1 TABLET BY MOUTH EVERY DAY 90 tablet 1   letrozole  (FEMARA ) 2.5 MG tablet Take 1 tablet (2.5 mg total) by mouth daily. 90 tablet 3   lidocaine -prilocaine  (EMLA ) cream Apply 1 Application topically as needed. 30 g 1   losartan  (COZAAR ) 100 MG tablet Take 1 tablet (100 mg total) by mouth daily. 90 tablet 3   metoprolol  succinate (TOPROL -XL) 100 MG 24 hr tablet TAKE 1 TABLET BY MOUTH EVERY DAY 90 tablet 3   omeprazole (PRILOSEC OTC) 20 MG tablet Take 20 mg by mouth daily.     No current facility-administered medications for this encounter.   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   Tobacco Use   Smoking status: Never   Smokeless tobacco: Never  Vaping Use   Vaping status: Never Used  Substance and Sexual Activity   Alcohol use: Yes    Comment: social drinker    Drug use: No   Sexual activity: Yes  Other Topics Concern   Not on file  Social History Narrative   Not on file   Social Drivers of Health   Financial Resource Strain: Low Risk  (12/03/2019)   Overall Financial Resource Strain (CARDIA)    Difficulty of Paying Living Expenses: Not hard at all  Food Insecurity: No Food  Insecurity (12/03/2019)   Hunger Vital Sign    Worried About Running Out of Food in the Last Year: Never true    Ran Out of Food in the Last Year: Never true  Transportation Needs: No Transportation Needs (12/03/2019)   PRAPARE - Administrator, Civil Service (Medical): No    Lack of Transportation (Non-Medical): No  Physical Activity: Not on file  Stress: Not on file  Social Connections: Not on file  Intimate Partner Violence: Not on file   Family History  Problem Relation Age of Onset   Hypertension Mother    Hypertension Father    BP 132/84   Pulse 75   Ht 5' 7 (1.702 m)   Wt 95.4 kg (210 lb 6.4 oz)   LMP 01/19/2013   SpO2 97%   BMI 32.95 kg/m   Wt Readings from Last 3 Encounters:  10/12/23 95.4 kg (210 lb 6.4 oz)  10/12/23 95.3 kg (210 lb)  09/21/23 96.2 kg (212 lb)   PHYSICAL EXAM: General:  Well appearing. No respiratory difficulty HEENT: normal Neck: supple. no JVD. Carotids 2+ bilat; no bruits. No lymphadenopathy or thyromegaly appreciated. Cor: PMI nondisplaced. Regular rate & rhythm. No rubs, gallops or murmurs. Lungs: clear Abdomen: soft, nontender, nondistended. No hepatosplenomegaly. No bruits or masses. Good bowel sounds. Extremities: no cyanosis, clubbing, rash, edema Neuro: alert & oriented x 3, cranial nerves grossly intact. moves all 4 extremities w/o difficulty. Affect pleasant.  EKG: NSR 75 bpm (personally reviewed)    ASSESSMENT & PLAN: 1.  Chemotherapy-induced CM - Echo 8/21: EF 60-65% G1DD - Echo 12/21: EF 60-65% GLS -18.2 - Echo  09/22/20: EF 40-45%  (I felt 50-55%) - Herceptin  held in June 2022 d/t mild cardiomyopathy. Restarted in August 2022 - Echo 7/22: EF 55-60% GLS 16 - Echo 9/22 F 55%. - Echo 11/22 EF 55% (completely stable) - Echo 2/23: EF 55-60% GLS -20% - Echo 6/23 EF 55-60% GLS -22.4% - Echo 5/24 EF 55-60% GLS -18.0%  - Echo 11/24 55-60% GLS 17%  - NYHA Class I. Evuolemic  - Continue losartan  100 mg daily. -  Continue Toprol  XL 100 mg daily - Continue Herceptin , pending official echo read from MD.  - Continue echos q 6 months. - I reviewed recent labs, SCr and K stable    2. Stage IV Right Breast Cancer (triple positive) - Diagnosed with R breast cancer in 8/21 (triple positive) found to have mets to bone. She received THP 01/03/20-05/27/20. Her restaging PET scan from 04/28/20 showed no hypermetabolic lesions. Given her excellent response, she was switched to maintenance therapy with Herceptin , Perjeta  q3weeks and Letrozole  starting 06/18/20.  Zometa  started 06/18/20, will continue every 3 months. - Herceptin  held 09/22/20 due to reduced EF, restarted in August as  above.  - Now on maintenance Herceptin , Perjeta  (Phesgo ) q3weeks and Letrozole   - Tolerating Herceptin  well. As long as EF stable on echo, will continue Herceptin    3. HTN - controlled, home SBPs 120s  - continue current regimen.   Will call with echo results. Follow up in 6 months with Dr Julane Ny + repeat echo for surveillance.   Ruddy Corral, PA-C  3:29 PM

## 2023-10-15 ENCOUNTER — Other Ambulatory Visit: Payer: Self-pay

## 2023-10-15 ENCOUNTER — Ambulatory Visit (HOSPITAL_COMMUNITY): Payer: Self-pay | Admitting: Family Medicine

## 2023-11-02 ENCOUNTER — Other Ambulatory Visit (HOSPITAL_COMMUNITY): Payer: Self-pay | Admitting: Internal Medicine

## 2023-11-02 ENCOUNTER — Encounter: Payer: Self-pay | Admitting: Hematology

## 2023-11-02 ENCOUNTER — Inpatient Hospital Stay: Attending: Hematology

## 2023-11-02 VITALS — BP 109/64 | HR 80 | Temp 97.5°F | Resp 20 | Ht 67.0 in | Wt 210.9 lb

## 2023-11-02 DIAGNOSIS — Z5111 Encounter for antineoplastic chemotherapy: Secondary | ICD-10-CM | POA: Diagnosis present

## 2023-11-02 DIAGNOSIS — C50411 Malignant neoplasm of upper-outer quadrant of right female breast: Secondary | ICD-10-CM | POA: Diagnosis not present

## 2023-11-02 DIAGNOSIS — Z1721 Progesterone receptor positive status: Secondary | ICD-10-CM | POA: Diagnosis not present

## 2023-11-02 DIAGNOSIS — Z1731 Human epidermal growth factor receptor 2 positive status: Secondary | ICD-10-CM | POA: Insufficient documentation

## 2023-11-02 DIAGNOSIS — C7951 Secondary malignant neoplasm of bone: Secondary | ICD-10-CM | POA: Diagnosis not present

## 2023-11-02 DIAGNOSIS — Z170 Estrogen receptor positive status [ER+]: Secondary | ICD-10-CM | POA: Diagnosis not present

## 2023-11-02 MED ORDER — DIPHENHYDRAMINE HCL 25 MG PO CAPS
50.0000 mg | ORAL_CAPSULE | Freq: Once | ORAL | Status: AC
  Administered 2023-11-02: 50 mg via ORAL
  Filled 2023-11-02: qty 2

## 2023-11-02 MED ORDER — PERTUZ-TRASTUZ-HYALURON-ZZXF 60-60-2000 MG-MG-U/ML CHEMO ~~LOC~~ SOLN
10.0000 mL | Freq: Once | SUBCUTANEOUS | Status: AC
  Administered 2023-11-02: 600 mg via SUBCUTANEOUS
  Filled 2023-11-02: qty 10

## 2023-11-02 MED ORDER — ACETAMINOPHEN 325 MG PO TABS
650.0000 mg | ORAL_TABLET | Freq: Once | ORAL | Status: AC
  Administered 2023-11-02: 650 mg via ORAL
  Filled 2023-11-02: qty 2

## 2023-11-02 NOTE — Patient Instructions (Signed)
 [facility] - A DEPT OF [facility]  Discharge Instructions: Thank you for choosing [facility] to provide your oncology and hematology care.   If you have a lab appointment with the Cancer Center, please go directly to the Cancer Center and check in at the registration area.   Wear comfortable clothing and clothing appropriate for easy access to any Portacath or PICC line.   We strive to give you quality time with your provider. You may need to reschedule your appointment if you arrive late (15 or more minutes).  Arriving late affects you and other patients whose appointments are after yours.  Also, if you miss three or more appointments without notifying the office, you may be dismissed from the clinic at the provider's discretion.      For prescription refill requests, have your pharmacy contact our office and allow 72 hours for refills to be completed.    Today you received the following chemotherapy and/or immunotherapy agents Phesgo .  Pertuzumab ; Trastuzumab ; Hyaluronidase Injection What is this medication? PERTUZUMAB ; TRASTUZUMAB ; HYALURONIDASE (per TOOZ ue mab; tras TOO zoo mab; hye al ur ON i dase) treats breast cancer. Pertuzumab  and trastuzumab  work by blocking a protein that causes cancer cells to grow and multiply. This helps to slow or stop the spread of cancer cells. Hyaluronidase works by increasing the absorption of other medications in the body to help them work better. It is a combination medication that contains two monoclonal antibodies. This medicine may be used for other purposes; ask your health care provider or pharmacist if you have questions. COMMON BRAND NAME(S): PHESGO  What should I tell my care team before I take this medication? They need to know if you have any of these conditions: Heart failure High blood pressure Irregular heartbeat or rhythm Lung disease An unusual or allergic reaction to pertuzumab ,  trastuzumab , hyaluronidase, other medications, foods, dyes, or preservatives Pregnant or trying to get pregnant Breast-feeding How should I use this medication? This medication is injected under the skin. It is usually given by your care team in a hospital or clinic setting. It may also be given at home by your care team. Talk to your care team about the use of this medication in children. Special care may be needed. Overdosage: If you think you have taken too much of this medicine contact a poison control center or emergency room at once. NOTE: This medicine is only for you. Do not share this medicine with others. What if I miss a dose? Keep appointments for follow-up doses. It is important not to miss your dose. Call your care team if you are unable to keep an appointment. What may interact with this medication? Certain types of chemotherapy, such as daunorubicin, doxorubicin, epirubicin, idarubicin This list may not describe all possible interactions. Give your health care provider a list of all the medicines, herbs, non-prescription drugs, or dietary supplements you use. Also tell them if you smoke, drink alcohol, or use illegal drugs. Some items may interact with your medicine. What should I watch for while using this medication? Your condition will be monitored carefully while you are receiving this medication. This medication may make you feel generally unwell. This is not uncommon as chemotherapy can affect healthy cells as well as cancer cells. Report any side effects. Continue your course of treatment even though you feel ill unless your care team tells you to stop. This medication may increase your risk of getting an infection. Call  your care team for advice if you get a fever, chills, sore throat, or other symptoms of a cold or flu. Do not treat yourself. Try to avoid being around people who are sick. Avoid taking medications that contain aspirin, acetaminophen , ibuprofen, naproxen, or  ketoprofen unless instructed by your care team. These medications may hide a fever. Talk to your care team if you may be pregnant. Serious birth defects can occur if you take this medication during pregnancy and for 7 months after the last dose. You will need a negative pregnancy test before starting this medication. Contraception is recommended while taking this medication and for 7 months after the last dose. Your care team can help you find the option that works for you. Do not breastfeed while taking this medication and for 7 months after the last dose. What side effects may I notice from receiving this medication? Side effects that you should report to your care team as soon as possible: Allergic reactions or angioedema--skin rash, itching or hives, swelling of the face, eyes, lips, tongue, arms, or legs, trouble swallowing or breathing Dry cough, shortness of breath or trouble breathing Heart failure--shortness of breath, swelling of the ankles, feet, or hands, sudden weight gain, unusual weakness or fatigue Infection--fever, chills, cough, or sore throat Infusion reactions--chest pain, shortness of breath or trouble breathing, feeling faint or lightheaded Side effects that usually do not require medical attention (report these to your care team if they continue or are bothersome): Diarrhea Hair loss Nausea Pain, redness, or irritation at injection site Pain, redness, or swelling with sores inside the mouth or throat Unusual weakness or fatigue This list may not describe all possible side effects. Call your doctor for medical advice about side effects. You may report side effects to FDA at 1-800-FDA-1088. Where should I keep my medication? This medication is given in a hospital or clinic. It will not be stored at home. NOTE: This sheet is a summary. It may not cover all possible information. If you have questions about this medicine, talk to your doctor, pharmacist, or health care  provider.  2024 Elsevier/Gold Standard (2021-08-26 00:00:00)      To help prevent nausea and vomiting after your treatment, we encourage you to take your nausea medication as directed.  BELOW ARE SYMPTOMS THAT SHOULD BE REPORTED IMMEDIATELY: *FEVER GREATER THAN 100.4 F (38 C) OR HIGHER *CHILLS OR SWEATING *NAUSEA AND VOMITING THAT IS NOT CONTROLLED WITH YOUR NAUSEA MEDICATION *UNUSUAL SHORTNESS OF BREATH *UNUSUAL BRUISING OR BLEEDING *URINARY PROBLEMS (pain or burning when urinating, or frequent urination) *BOWEL PROBLEMS (unusual diarrhea, constipation, pain near the anus) TENDERNESS IN MOUTH AND THROAT WITH OR WITHOUT PRESENCE OF ULCERS (sore throat, sores in mouth, or a toothache) UNUSUAL RASH, SWELLING OR PAIN  UNUSUAL VAGINAL DISCHARGE OR ITCHING   Items with * indicate a potential emergency and should be followed up as soon as possible or go to the Emergency Department if any problems should occur.  Please show the CHEMOTHERAPY ALERT CARD or IMMUNOTHERAPY ALERT CARD at check-in to the Emergency Department and triage nurse.  Should you have questions after your visit or need to cancel or reschedule your appointment, please contact [facility]  Dept: [phone]  and follow the prompts.  Office hours are 8:00 a.m. to 4:30 p.m. Monday - Friday. Please note that voicemails left after 4:00 p.m. may not be returned until the following business day.  We are closed weekends  and major holidays. You have access to a nurse at all times for urgent questions. Please call the main number to the clinic Dept: [phone] and follow the prompts.   For any non-urgent questions, you may also contact your provider using MyChart. We now offer e-Visits for anyone 32 and older to request care online for non-urgent symptoms. For details visit [email or web address].   Also download the MyChart app! Go to the app store, search "MyChart", open the  app, select [place], and log in with your MyChart username and password.

## 2023-11-09 ENCOUNTER — Other Ambulatory Visit: Payer: Self-pay | Admitting: Hematology

## 2023-11-09 DIAGNOSIS — C50911 Malignant neoplasm of unspecified site of right female breast: Secondary | ICD-10-CM

## 2023-11-10 ENCOUNTER — Other Ambulatory Visit: Payer: Self-pay

## 2023-11-21 ENCOUNTER — Other Ambulatory Visit: Payer: Self-pay

## 2023-11-21 DIAGNOSIS — Z170 Estrogen receptor positive status [ER+]: Secondary | ICD-10-CM

## 2023-11-21 DIAGNOSIS — C7951 Secondary malignant neoplasm of bone: Secondary | ICD-10-CM

## 2023-11-22 ENCOUNTER — Inpatient Hospital Stay

## 2023-11-22 ENCOUNTER — Inpatient Hospital Stay: Admitting: Hematology

## 2023-11-22 VITALS — BP 116/73 | HR 84 | Resp 18

## 2023-11-22 VITALS — BP 130/78 | HR 96 | Temp 97.6°F | Resp 17 | Ht 67.0 in | Wt 212.9 lb

## 2023-11-22 DIAGNOSIS — C50411 Malignant neoplasm of upper-outer quadrant of right female breast: Secondary | ICD-10-CM

## 2023-11-22 DIAGNOSIS — Z170 Estrogen receptor positive status [ER+]: Secondary | ICD-10-CM | POA: Diagnosis not present

## 2023-11-22 DIAGNOSIS — Z5111 Encounter for antineoplastic chemotherapy: Secondary | ICD-10-CM | POA: Diagnosis not present

## 2023-11-22 DIAGNOSIS — T451X5A Adverse effect of antineoplastic and immunosuppressive drugs, initial encounter: Secondary | ICD-10-CM

## 2023-11-22 DIAGNOSIS — I427 Cardiomyopathy due to drug and external agent: Secondary | ICD-10-CM

## 2023-11-22 DIAGNOSIS — C7951 Secondary malignant neoplasm of bone: Secondary | ICD-10-CM

## 2023-11-22 LAB — CBC WITH DIFFERENTIAL (CANCER CENTER ONLY)
Abs Immature Granulocytes: 0.01 K/uL (ref 0.00–0.07)
Basophils Absolute: 0 K/uL (ref 0.0–0.1)
Basophils Relative: 0 %
Eosinophils Absolute: 0.2 K/uL (ref 0.0–0.5)
Eosinophils Relative: 2 %
HCT: 38.9 % (ref 36.0–46.0)
Hemoglobin: 13.3 g/dL (ref 12.0–15.0)
Immature Granulocytes: 0 %
Lymphocytes Relative: 42 %
Lymphs Abs: 2.9 K/uL (ref 0.7–4.0)
MCH: 30 pg (ref 26.0–34.0)
MCHC: 34.2 g/dL (ref 30.0–36.0)
MCV: 87.8 fL (ref 80.0–100.0)
Monocytes Absolute: 0.7 K/uL (ref 0.1–1.0)
Monocytes Relative: 11 %
Neutro Abs: 3.1 K/uL (ref 1.7–7.7)
Neutrophils Relative %: 45 %
Platelet Count: 318 K/uL (ref 150–400)
RBC: 4.43 MIL/uL (ref 3.87–5.11)
RDW: 14.4 % (ref 11.5–15.5)
WBC Count: 6.9 K/uL (ref 4.0–10.5)
nRBC: 0 % (ref 0.0–0.2)

## 2023-11-22 LAB — CMP (CANCER CENTER ONLY)
ALT: 10 U/L (ref 0–44)
AST: 14 U/L — ABNORMAL LOW (ref 15–41)
Albumin: 4.1 g/dL (ref 3.5–5.0)
Alkaline Phosphatase: 89 U/L (ref 38–126)
Anion gap: 5 (ref 5–15)
BUN: 14 mg/dL (ref 8–23)
CO2: 29 mmol/L (ref 22–32)
Calcium: 9.1 mg/dL (ref 8.9–10.3)
Chloride: 109 mmol/L (ref 98–111)
Creatinine: 1.16 mg/dL — ABNORMAL HIGH (ref 0.44–1.00)
GFR, Estimated: 53 mL/min — ABNORMAL LOW (ref >60)
Glucose, Bld: 108 mg/dL — ABNORMAL HIGH (ref 70–99)
Potassium: 3.6 mmol/L (ref 3.5–5.1)
Sodium: 143 mmol/L (ref 135–145)
Total Bilirubin: 0.5 mg/dL (ref 0.0–1.2)
Total Protein: 7.3 g/dL (ref 6.5–8.1)

## 2023-11-22 MED ORDER — DIPHENHYDRAMINE HCL 25 MG PO CAPS
50.0000 mg | ORAL_CAPSULE | Freq: Once | ORAL | Status: AC
  Administered 2023-11-22: 50 mg via ORAL
  Filled 2023-11-22: qty 2

## 2023-11-22 MED ORDER — GABAPENTIN 100 MG PO CAPS: 200.0000 mg | ORAL_CAPSULE | Freq: Every day | ORAL | 2 refills | Status: DC

## 2023-11-22 MED ORDER — ZOLEDRONIC ACID 4 MG/100ML IV SOLN
4.0000 mg | Freq: Once | INTRAVENOUS | Status: AC
  Administered 2023-11-22: 4 mg via INTRAVENOUS
  Filled 2023-11-22: qty 100

## 2023-11-22 MED ORDER — SODIUM CHLORIDE 0.9 % IV SOLN
Freq: Once | INTRAVENOUS | Status: AC
Start: 2023-11-22 — End: 2023-11-22

## 2023-11-22 MED ORDER — SODIUM CHLORIDE 0.9% FLUSH
10.0000 mL | INTRAVENOUS | Status: DC | PRN
Start: 2023-11-22 — End: 2023-11-22

## 2023-11-22 MED ORDER — PERTUZ-TRASTUZ-HYALURON-ZZXF 60-60-2000 MG-MG-U/ML CHEMO ~~LOC~~ SOLN
600.0000 mg | Freq: Once | SUBCUTANEOUS | Status: AC
  Administered 2023-11-22: 600 mg via SUBCUTANEOUS
  Filled 2023-11-22: qty 10

## 2023-11-22 MED ORDER — HEPARIN SOD (PORK) LOCK FLUSH 100 UNIT/ML IV SOLN
500.0000 [IU] | Freq: Once | INTRAVENOUS | Status: DC | PRN
Start: 2023-11-22 — End: 2023-11-22

## 2023-11-22 MED ORDER — LETROZOLE 2.5 MG PO TABS: 2.5000 mg | ORAL_TABLET | Freq: Every day | ORAL | 3 refills | Status: AC

## 2023-11-22 MED ORDER — SODIUM CHLORIDE 0.9% FLUSH
10.0000 mL | Freq: Once | INTRAVENOUS | Status: AC
  Administered 2023-11-22: 10 mL

## 2023-11-22 MED ORDER — ACETAMINOPHEN 325 MG PO TABS
650.0000 mg | ORAL_TABLET | Freq: Once | ORAL | Status: AC
  Administered 2023-11-22: 650 mg via ORAL
  Filled 2023-11-22: qty 2

## 2023-11-22 NOTE — Assessment & Plan Note (Signed)
-  echo 09/22/20 showed decreased EF. She was started on losartan  by Dr. Bensimhon on 10/21/20 and EF improved. -repeated echo 09/2023 showed EF 55-60%, stable

## 2023-11-22 NOTE — Assessment & Plan Note (Signed)
 stage IV [id number]), with bone and possible liver mets,  ER+/PR+/HER2+, Grade II-III   --diagnosed in 11/2019 with large right breast mass measuring up to 11cm and right lymphadenopathy. 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 Left axillary node biopsy was benign.  -01/01/20 Liver biopsy was negative but 01/12/20 Bone Biopsy was positive for metastatic carcinoma from primary breast cancer. ER/PR/HER2 were all negative, possibly related to decalcification. -She received THP 01/03/20 - 2/3/2 -on maintenance HP Phesgo  injection every 3 weeks -last staging scan in 08/2020, not repeated due to lack of insurance and she is clinically doing well  -repeated CT and bone scan 05/05/2022 showed good partial response in liver, minimum uptake in bone mets, no concerns for new lesion or evidence of progression.  I personally reviewed his scan images and discussed with patient. -Will continue Phesgo  injection every 3 weeks and letrozole , she is tolerating well  -CT 07/02/2023 was negative for residual disease, will repeat bone scan

## 2023-11-22 NOTE — Patient Instructions (Signed)
 [facility]  Discharge Instructions: Thank you for choosing [facility] to provide your oncology and hematology care.   If you have a lab appointment with the Cancer Center, please go directly to the Cancer Center and check in at the registration area.   Wear comfortable clothing and clothing appropriate for easy access to any Portacath or PICC line.   We strive to give you quality time with your provider. You may need to reschedule your appointment if you arrive late (15 or more minutes).  Arriving late affects you and other patients whose appointments are after yours.  Also, if you miss three or more appointments without notifying the office, you may be dismissed from the clinic at the provider's discretion.      For prescription refill requests, have your pharmacy contact our office and allow 72 hours for refills to be completed.    Today you received the following chemotherapy and/or immunotherapy agents: Phesgo.      To help prevent nausea and vomiting after your treatment, we encourage you to take your nausea medication as directed.  BELOW ARE SYMPTOMS THAT SHOULD BE REPORTED IMMEDIATELY: *FEVER GREATER THAN 100.4 F (38 C) OR HIGHER *CHILLS OR SWEATING *NAUSEA AND VOMITING THAT IS NOT CONTROLLED WITH YOUR NAUSEA MEDICATION *UNUSUAL SHORTNESS OF BREATH *UNUSUAL BRUISING OR BLEEDING *URINARY PROBLEMS (pain or burning when urinating, or frequent urination) *BOWEL PROBLEMS (unusual diarrhea, constipation, pain near the anus) TENDERNESS IN MOUTH AND THROAT WITH OR WITHOUT PRESENCE OF ULCERS (sore throat, sores in mouth, or a toothache) UNUSUAL RASH, SWELLING OR PAIN  UNUSUAL VAGINAL DISCHARGE OR ITCHING   Items with * indicate a potential emergency and should be followed up as soon as possible or go to the Emergency Department if any problems should occur.  Please show the CHEMOTHERAPY ALERT CARD or IMMUNOTHERAPY  ALERT CARD at check-in to the Emergency Department and triage nurse.  Should you have questions after your visit or need to cancel or reschedule your appointment, please contact [facility] - A DEPT OF [facility]  Dept: [phone]  and follow the prompts.  Office hours are 8:00 a.m. to 4:30 p.m. Monday - Friday. Please note that voicemails left after 4:00 p.m. may not be returned until the following business day.  We are closed weekends and major holidays. You have access to a nurse at all times for urgent questions. Please call the main number to the clinic Dept: [phone] and follow the prompts.   For any non-urgent questions, you may also contact your provider using MyChart. We now offer e-Visits for anyone 2 and older to request care online for non-urgent symptoms. For details visit [email or web address].   Also download the MyChart app! Go to the app store, search "MyChart", open the app, select [place], and log in with your MyChart username and password.

## 2023-11-22 NOTE — Progress Notes (Signed)
Pt observed for 15 minutes post Phesgo injection. Pt tolerated Tx well w/out incident. VSS at discharge.  Ambulatory to lobby.

## 2023-11-22 NOTE — Progress Notes (Signed)
 [facility]   [phone] Fax:([phone]   Clinic Follow up Note   Patient Care Team: Asuncion Setter, PA-C as PCP - General (Physician Assistant) Aron Shoulders, MD as Consulting Physician (General Surgery) Lanny Callander, MD as Consulting Physician (Hematology) Dewey Rush, MD as Consulting Physician (Radiation Oncology) Glean Stephane BROCKS, RN (Inactive) as Oncology Nurse Navigator Tyree Nanetta SAILOR, RN as Oncology Nurse Navigator  Date of Service:  11/22/2023  CHIEF COMPLAINT: f/u of metastatic breast cancer  CURRENT THERAPY:  Letrozole  and Phesgo  injection  Oncology History   Chemotherapy induced cardiomyopathy (HCC) -echo 09/22/20 showed decreased EF. She was started on losartan  by Dr. Cherrie on 10/21/20 and EF improved. -repeated echo 09/2023 showed EF 55-60%, stable     Malignant neoplasm of upper-outer quadrant of right breast in female, estrogen receptor positive (HCC) stage IV r([id number]), with bone and possible liver mets,  ER+/PR+/HER2+, Grade II-III   --diagnosed in 11/2019 with large right breast mass measuring up to 11cm and right lymphadenopathy. 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 Left axillary node biopsy was benign.  -01/01/20 Liver biopsy was negative but 01/12/20 Bone Biopsy was positive for metastatic carcinoma from primary breast cancer. ER/PR/HER2 were all negative, possibly related to decalcification. -She received THP 01/03/20 - 2/3/2 -on maintenance HP Phesgo  injection every 3 weeks -last staging scan in 08/2020, not repeated due to lack of insurance and she is clinically doing well  -repeated CT and bone scan 05/05/2022 showed good partial response in liver, minimum uptake in bone mets, no concerns for new lesion or evidence of progression.  I personally reviewed his scan images and discussed with patient. -Will continue Phesgo  injection every 3 weeks and letrozole , she is  tolerating well  -CT 07/02/2023 was negative for residual disease, will repeat bone scan     Assessment & Plan Metastatic breast cancer with bone involvement The disease is well-controlled with no new symptoms such as back or pelvic pain. Weight and appetite are stable. She has no issues with Letrozole  or injections. Blood counts, kidney, and liver function tests are normal. Tumor markers remain stable. A CT scan in March showed stable disease, with the next scan due in September. An annual mammogram is scheduled for August 7th. Zometa  infusion has been ongoing for over three years, with a plan to change the frequency to every six months. - Order CT scan and bone scan before September 12 appointment. - Schedule next appointment for September 11 to review scan results. - Continue Letrozole  and provide a refill. - Continue Zometa  infusion, change frequency to every six months. - Administer next injection on August 22 at [facility]. - Schedule subsequent injections at [facility] except for September 11 at [place].  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 Symptoms have been present since chemotherapy and have not worsened. Gabapentin  is effective in managing symptoms at night. - Continue gabapentin , refill prescription at [facility] on [address].  Plan - She is clinically doing well, mild neuropathy stable, functioning very well - Continue phesgo  injection every 3 weeks - Continue letrozole  - Restaging CT and bone scan in about 5 to 6 weeks, follow-up in 6 weeks. - Will proceed with Zometa  today, if next bone scan stable, may consider extend to every 6 months.   SUMMARY OF ONCOLOGIC HISTORY: Oncology History Overview Note  Cancer Staging Malignant neoplasm of upper-outer quadrant of  right breast in female, estrogen receptor positive  (HCC) Staging form: Breast, AJCC 8th Edition - Clinical stage from 11/25/2019: Stage IIA (cT3, cN1, cM0, G2, ER+, PR+, HER2+) - Signed by Lanny Callander, MD on 12/03/2019    Malignant neoplasm of upper-outer quadrant of right breast in female, estrogen receptor positive (HCC)  11/13/2019 Mammogram    Diagnostic Mammogram 11/13/19  IMPRESSION: -Highly suspicious mass and calcifications in the right breast centered between 9 and 12 o'clock spanning up to 11 cm.  -Multiple masses in the right axilla. One of the masses contains calcifications, denoted as mass number 2 measuring 9 x 11 by 10 mm. Several of these masses do not appear to represent definitive lymph nodes. Others may be small but abnormal appearing lymph nodes. Taken in total, a cluster of masses in the right axilla spans up to 3.7 cm.  -There are fibrocystic changes on the left. There are also some solid-appearing masses. A solid appearing mass at 10:30, 6 cm from the nipple measures 9 x 6 x 6 mm. An adjacent solid mass at 10 o'clock, 6 cm from the nipple measures 8 x 6 by 5 mm. Another solid-appearing mass at 11 o'clock, 1 cm from the nipple measures 5 x 4 by 5 mm. Fibrocystic changes are seen at 10 o'clock, 12 o'clock, and 4 o'clock. Another solid-appearing mass seen at 5 o'clock, 4 cm from the nipple measuring 6 x 3 x 4 mm. There is a single abnormal node in the left axilla with a cortex measuring 5 mm and restriction of the fatty hilum.   11/25/2019 Initial Biopsy   Diagnosis 1. Breast, right, needle core biopsy, upper outer - INVASIVE MAMMARY CARCINOMA - MAMMARY CARCINOMA IN SITU WITH CALCIFICATIONS AND NECROSIS - SEE COMMENT 2. Lymph node, needle/core biopsy, right axilla - INVASIVE MAMMARY CARCINOMA - SEE COMMENT 3. Lymph node, needle/core biopsy, left axilla - BENIGN LYMPH NODE - NO CARCINOMA IDENTIFIED Microscopic Comment 1. The biopsy material shows an infiltrative proliferation of cells arranged linearly and in small  clusters. Based on the biopsy, the carcinoma appears Nottingham grade 2 of 3 and measures 1.2 cm in greatest linear extent. E-cadherin and prognostic markers (ER/PR/ki-67/HER2)are pending and will be reported in an addendum. Dr. Alvaro reviewed the case and agrees with the above diagnosis. These results were called to [facility] on November 26, 2019. 2. Definitive nodal tissue is not identified. This biopsy has a slightly different architecture morphology than what is seen in part 1. The carcinoma measures 0.7 cm in greatest dimension. E-cadherin and prognostic markers (ER, PR, Ki-67 and HER-2) are pending and will be reported in an addendum.   11/25/2019 Receptors her2   1. PROGNOSTIC INDICATORS Results: IMMUNOHISTOCHEMICAL AND MORPHOMETRIC ANALYSIS PERFORMED MANUALLY The tumor cells are POSITIVE for Her2 (3+). Estrogen Receptor: 90%, POSITIVE, STRONG STAINING INTENSITY Progesterone Receptor: 20%, POSITIVE, STRONG STAINING INTENSITY Proliferation Marker Ki67: 30%  2. PROGNOSTIC INDICATORS Results: IMMUNOHISTOCHEMICAL AND MORPHOMETRIC ANALYSIS PERFORMED MANUALLY The tumor cells are POSITIVE for Her2 (3+). Estrogen Receptor: 90%, POSITIVE, STRONG STAINING INTENSITY Progesterone Receptor: 25%, POSITIVE, STRONG STAINING INTENSITY Proliferation Marker Ki67: 30%   11/25/2019 Cancer Staging   Staging form: Breast, AJCC 8th Edition - Clinical stage from 11/25/2019: Stage IIA (cT3, cN1, cM0, G2, ER+, PR+, HER2+) - Signed by Lanny Callander, MD on 12/03/2019   12/01/2019 Initial Diagnosis   Malignant neoplasm of upper-outer quadrant of right breast in female, estrogen receptor positive (HCC)   12/10/2019 Imaging   Bone Scan  IMPRESSION: Findings concerning for bony metastatic disease in the left acetabulum region, L3 vertebral body region, and anterolateral right tenth rib.   Increased uptake in several large joints likely is of arthropathic etiology.   12/11/2019 Imaging   CT CAP  w contrast  IMPRESSION: Prominent glandular tissue in the central right breast with nipple retraction, likely corresponding to the patient's known right breast neoplasm.   Prominent right axillary nodes measuring up to 8 mm short axis, suspicious for nodal metastases in this clinical context.   3 mm subpleural pulmonary nodule in the right lower lobe, likely benign. However, follow-up CT chest is suggested in 3-6 months.   Multiple hepatic lesions, including a dominant 2.4 cm lesion in segment 6, which is considered indeterminate. Metastasis is not excluded. Consider MRI abdomen with/without contrast for further characterization.   11 mm sclerotic lesion along the left posterior aspect of the T7 vertebral body raises concern for isolated osseous metastasis. Correlate with priors if available. Benign vertebral hemangioma at T11.   12/12/2019 Breast MRI   IMPRESSION: 1. The biopsy proven malignancy in the right breast involves all 4 quadrants and spans up to 9 cm by MRI. There is enhancement within the skin at the level of the nipple.   2. Multiple matted masses are seen in the right axilla, either abnormal metastatic lymph nodes or a combination of masses and metastatic lymph nodes. One of these has been previously biopsied showing invasive mammary carcinoma without definitive nodal tissue.   3.  There is a 1.5 cm Rotter's lymph node on the right.   4. There is markedly abnormal enhancement and edema in the right pectoralis major muscle spanning 6-7 cm, suspicious for metastatic disease.   5. There is diffuse nodular enhancement throughout the left breast, which may correspond with the solid-appearing masses seen on ultrasound. These all have a similar appearance on MRI.   6.  No findings of left axillary lymphadenopathy.   12/15/2019 Procedure   PAC placed    12/24/2019 Imaging   MRI abdomen  IMPRESSION: 1. Multifocal enhancing bone lesions are noted within the  lumbar spine, and bony pelvis compatible with metastatic disease. 2. Four, small peripherally enhancing cystic lesions are noted within the liver worrisome for metastatic disease. 3. 2 enhancing liver lesions are also noted with signal and enhancement characteristics consistent with benign liver hemangioma. 4. Well-circumscribed, enhancing T2 and T1 hypointense structure within the right adnexal region is indeterminate. It is unclear whether not this is arising from the right ovary, broad ligament or right side of uterine fundus. Differential considerations include fibrothecoma, versus pedunculated uterine leiomyoma. Metastatic disease is considered less favored. Attention on follow-up imaging is recommended.     01/03/2020 - 05/27/2020 Chemotherapy   First line THP q3weeks starting 01/03/20-05/27/20   01/05/2020 Imaging   US  Abdomen  IMPRESSION: 1. Suspect hematoma in the medial right lobe of the liver in the area of previous biopsy. Note that recent MR does not show lesion in this area. This area has ill-defined margins and measures 3.9 x 4.0 x 3.5 cm. No perihepatic fluid.   2. Probable hemangiomas elsewhere in the right lobe of the liver. Note that several smaller lesions seen on recent MR are not appreciable by ultrasound.   3.  Study otherwise unremarkable.   01/12/2020 Pathology Results   FINAL MICROSCOPIC DIAGNOSIS:   A. BONE, SCLEROTIC LESION, LEFT ANTERIOR ILIAC SPINE, BIOPSY:  - Metastatic carcinoma, consistent with patient's clinical history of  primary breast carcinoma  -  See comment   COMMENT:  Immunohistochemical stains show that the tumor cells are positive for  CK7 and GATA3; and negative for CK20, consistent with above  interpretation.   PROGNOSTIC INDICATOR RESULTS:  The tumor cells are NEGATIVE for Her2 (0); see comment  Estrogen Receptor: NEGATIVE; see comment  Progesterone Receptor: NEGATIVE; see comment    03/23/2020 Imaging   CT CAP IMPRESSION: 1.  Right lower lobe perifissural nodule is slightly more conspicuous on today's study. Close attention on follow-up recommended. 2. Interval progression of segment IV liver lesion, concerning for metastatic progression. The remaining visualized liver lesions are stable to smaller in the interval. 3. Interval evolution in appearance of thoracolumbar spinal lesions and left iliac crest lesion, potentially reflecting response to therapy/healing. No definite new osseous lesions on today's study. 4. New small fluid collection right cul-de-sac. 5. Probable fibroid change in the uterus including exophytic right fundal lesion. 6. Aortic Atherosclerosis (ICD10-I70.0).   03/31/2020 Imaging   MR ABD IMPRESSION: 1. Enlarging hepatic lesion in hepatic subsegment IV suspicious for hepatic metastasis. The it is uncertain whether the change in the short interval is due to technical factors with different modalities or true enlargement. 2. Lesion in the posterior RIGHT hepatic lobe with imaging features that are atypical for hemangioma but stability and signs of enhancement on later phase raising this question. Another more classic appearing may angioma seen inferior to this location in hepatic subsegment VI. Attention on follow-up. 3. Heterogeneous enhancement throughout the spine particularly at L3 as exhibited on the prior study with very patchy marrow signal remains suspicious for bony metastatic lesions in this patient with known bone metastases.   04/01/2020 Imaging   Bone scan IMPRESSION: Good response to therapy with resolution of sites of uptake seen previously at the cervical spine and posterior RIGHT tenth rib as well as a diminished degree of uptake at remaining previously identified osseous metastatic foci.   04/28/2020 PET scan   IMPRESSION: 1. No hypermetabolic lesions to suggest active metastatic disease involving the liver or osseous structures. 2. Stable small low-attenuation liver  lesions, likely treated disease.   06/18/2020 -  Chemotherapy   Given her excellent response, I switched to maintenance therapy with Herceptin , Perjeta  (Phesgo ) q3weeks and Letrozole .   06/18/2020 -  Chemotherapy   Zometa  starting 06/18/20   06/18/2020 -  Anti-estrogen oral therapy   Letrozole  2.5mg  once dialy.    06/18/2020 - 06/16/2022 Chemotherapy   Patient is on Treatment Plan : BREAST Trastuzumab  + Pertuzumab  q21d     09/03/2020 Imaging   Bone Scan IMPRESSION: 1. Significant interval improvement compared to previous studies. Only mild uptake remains in the pelvis as above. There has been significant interval improvement over time.  CT C/A/P IMPRESSION: 1. Hypodense liver lesions are slightly decreased in size compared to prior examination, consistent with treatment response of hepatic metastatic disease. 2. Unchanged sclerotic lesion of the anterior left iliac crest. Unchanged lytic superior endplate deformity of L3, which remains equivocal for metastatic lesion versus mechanical Schmorl deformity. No CT evidence of new osseous metastatic disease. 3. No evidence of new metastatic disease in the chest, abdomen, or pelvis. 4. Coronary artery disease.   05/05/2022 Imaging    IMPRESSION: 1. Interval decreased size of the largest hepatic metastatic lesion with stable size of the additional subcentimeter bilobar hepatic lesions. Continued decrease in size of the bilobar hepatic metastases. 2. Similar appearance of the osseous metatatic disease. No new aggressive lytic or blastic lesion of bone. 3. No evidence of  new or progressive metastatic disease in the chest, abdomen or pelvis.     05/05/2022 Imaging    IMPRESSION: Faint residual radiotracer uptake in the left hemipelvis.   New focus of mild abnormal radiotracer uptake in the right lateral seventh rib without discrete CT correlate on same day examination, nonspecific suggest correlation with history of interval  trauma.     07/07/2022 -  Chemotherapy   Patient is on Treatment Plan : BREAST Trastuzumab  + Pertuzumab  q21d     02/28/2023 Echocardiogram   IMPRESSIONS   1. Left ventricular ejection fraction, by estimation, is 55 to 60%. The left ventricle has normal function. The left ventricle has no regional wall motion abnormalities. Left ventricular diastolic parameters are consistent with Grade I diastolic  dysfunction (impaired relaxation). The average left ventricular global longitudinal strain is -17.1 %. The global longitudinal strain is normal.   2. Right ventricular systolic function is normal. The right ventricular size is normal. There is normal pulmonary artery systolic pressure. The estimated right ventricular systolic pressure is 28.8 mmHg.   3. The mitral valve is normal in structure. No evidence of mitral valve regurgitation. No evidence of mitral stenosis.   4. The aortic valve is tricuspid. Aortic valve regurgitation is trivial. No aortic stenosis is present.   5. The inferior vena cava is normal in size with greater than 50% respiratory variability, suggesting right atrial pressure of 3 mmHg.  Comparison(s): No significant change from prior study. Prior GLS: -18.0.      07/02/2023 Imaging   CT CAP with contrast  IMPRESSION: CHEST:  No evidence of breast cancer metastasis in the chest.  PELVIS:  1. No evidence recurrent breast cancer within liver. Stable small hypodensities. 2. Previous described metastatic skeletal lesions are not well appreciated. 3. No new or progressive disease.      Discussed the use of AI scribe software for clinical note transcription with the patient, who gave verbal consent to proceed.  History of Present Illness Carolyn Stewart is a 63 year old female with metastatic breast cancer who presents for follow-up.  She is on Letrozole  and requires a refill. She receives injections and Zometa  infusions every three months, which she has been on for over  three years. Neuropathy in her feet, attributed to chemotherapy, is particularly bothersome at night, described as 'vibrating,' affecting her sleep. Gabapentin , taken as two tablets at night, helps manage the symptoms. Neuropathy does not significantly bother her during the day and has not worsened over time.  Her weight is stable, and she has no issues with appetite. She experiences occasional tiredness but maintains her usual activities. No back or pelvic pain is present.  Recent lab results show normal blood counts, and previous tests indicated normal kidney and liver function. Tumor markers have been stable. She had a CT scan in March and is due for another scan in September, along with a bone scan due to bone involvement in her disease. She is scheduled for a mammogram on August 7th.     All other systems were reviewed with the patient and are negative.  MEDICAL HISTORY:  Past Medical History:  Diagnosis Date   Cancer (HCC) 2021   Hypertension     SURGICAL HISTORY: Past Surgical History:  Procedure Laterality Date   left parotid tumor removal   2006   PORTACATH PLACEMENT Left 12/15/2019   Procedure: INSERTION PORT-A-CATH WITH ULTRASOUND GUIDANCE;  Surgeon: Aron Shoulders, MD;  Location: [facility] OR;  Service: General;  Laterality: Left;  TONSILLECTOMY      I have reviewed the social history and family history with the patient and they are unchanged from previous note.  ALLERGIES:  has no known allergies.  MEDICATIONS:  Current Outpatient Medications  Medication Sig Dispense Refill   acetaminophen  (TYLENOL ) 500 MG tablet Take 1,000 mg by mouth every 6 (six) hours as needed for moderate pain or headache.     amLODipine  (NORVASC ) 10 MG tablet Take 2 tablets (20 mg total) by mouth daily. 60 tablet 11   Cholecalciferol (VITAMIN D3) 50 MCG (2000 UT) TABS Take 2,000 Units by mouth daily.      gabapentin  (NEURONTIN ) 100 MG capsule Take 2 capsules (200 mg total) by mouth at bedtime. 60  capsule 2   KLOR-CON  M10 10 MEQ tablet TAKE 1 TABLET BY MOUTH EVERY DAY 90 tablet 1   letrozole  (FEMARA ) 2.5 MG tablet Take 1 tablet (2.5 mg total) by mouth daily. 90 tablet 3   lidocaine -prilocaine  (EMLA ) cream Apply 1 Application topically as needed. 30 g 1   losartan  (COZAAR ) 100 MG tablet TAKE 1 TABLET BY MOUTH EVERY DAY 90 tablet 3   metoprolol  succinate (TOPROL -XL) 100 MG 24 hr tablet TAKE 1 TABLET BY MOUTH EVERY DAY 90 tablet 3   omeprazole (PRILOSEC OTC) 20 MG tablet Take 20 mg by mouth daily.     No current facility-administered medications for this visit.   Facility-Administered Medications Ordered in Other Visits  Medication Dose Route Frequency Provider Last Rate Last Admin   sodium chloride  flush (NS) 0.9 % injection 10 mL  10 mL Intravenous PRN Lanny Callander, MD        PHYSICAL EXAMINATION: ECOG PERFORMANCE STATUS: 0 - Asymptomatic  Vitals:   11/22/23 0929  BP: 130/78  Pulse: 96  Resp: 17  Temp: 97.6 F (36.4 C)  SpO2: 95%   Wt Readings from Last 3 Encounters:  11/22/23 212 lb 14.4 oz (96.6 kg)  11/02/23 210 lb 14.4 oz (95.7 kg)  10/12/23 210 lb 6.4 oz (95.4 kg)     GENERAL:alert, no distress and comfortable SKIN: skin color, texture, turgor are normal, no rashes or significant lesions EYES: normal, Conjunctiva are pink and non-injected, sclera clear NECK: supple, thyroid normal size, non-tender, without nodularity LYMPH:  no palpable lymphadenopathy in the cervical, axillary  LUNGS: clear to auscultation and percussion with normal breathing effort HEART: regular rate & rhythm and no murmurs and no lower extremity edema ABDOMEN:abdomen soft, non-tender and normal bowel sounds Musculoskeletal:no cyanosis of digits and no clubbing  NEURO: alert & oriented x 3 with fluent speech, no focal motor/sensory deficits  Physical Exam    LABORATORY DATA:  I have reviewed the data as listed    Latest Ref Rng & Units 11/22/2023    9:00 AM 09/21/2023   12:26 PM 07/20/2023    12:49 PM  CBC  WBC 4.0 - 10.5 K/uL 6.9  7.4  8.1   Hemoglobin 12.0 - 15.0 g/dL 86.6  87.2  87.0   Hematocrit 36.0 - 46.0 % 38.9  38.9  39.2   Platelets 150 - 400 K/uL 318  341  454         Latest Ref Rng & Units 11/22/2023    9:00 AM 09/21/2023   12:26 PM 07/20/2023   12:49 PM  CMP  Glucose 70 - 99 mg/dL 891  884  895   BUN 8 - 23 mg/dL 14  15  15    Creatinine 0.44 - 1.00 mg/dL 8.83  9.13  0.91   Sodium 135 - 145 mmol/L 143  142  143   Potassium 3.5 - 5.1 mmol/L 3.6  3.8  3.8   Chloride 98 - 111 mmol/L 109  111  109   CO2 22 - 32 mmol/L 29  28  28    Calcium 8.9 - 10.3 mg/dL 9.1  9.1  9.9   Total Protein 6.5 - 8.1 g/dL 7.3  7.2  7.7   Total Bilirubin 0.0 - 1.2 mg/dL 0.5  0.4  0.4   Alkaline Phos 38 - 126 U/L 89  98  111   AST 15 - 41 U/L 14  16  14    ALT 0 - 44 U/L 10  13  16        RADIOGRAPHIC STUDIES: I have personally reviewed the radiological images as listed and agreed with the findings in the report. No results found.    Orders Placed This Encounter  Procedures   CT CHEST ABDOMEN PELVIS W CONTRAST    Standing Status:   Future    Expected Date:   12/31/2023    Expiration Date:   11/21/2024    If indicated for the ordered procedure, I authorize the administration of contrast media per Radiology protocol:   Yes    Does the patient have a contrast media/X-ray dye allergy?:   No    Preferred imaging location?:   [facility]    If indicated for the ordered procedure, I authorize the administration of oral contrast media per Radiology protocol:   Yes   NM Bone Scan Whole Body    Standing Status:   Future    Expected Date:   12/31/2023    Expiration Date:   11/21/2024    If indicated for the ordered procedure, I authorize the administration of a radiopharmaceutical per Radiology protocol:   Yes    Preferred imaging location?:   [facility]   CBC with Differential (Cancer Center Only)    Standing Status:   Future    Expected Date:   12/14/2023     Expiration Date:   12/13/2024   CMP (Cancer Center only)    Standing Status:   Future    Expected Date:   12/14/2023    Expiration Date:   12/13/2024   CA 27.29    Standing Status:   Future    Expected Date:   12/14/2023    Expiration Date:   12/13/2024   CBC with Differential (Cancer Center Only)    Standing Status:   Future    Expected Date:   01/04/2024    Expiration Date:   01/03/2025   CMP (Cancer Center only)    Standing Status:   Future    Expected Date:   01/04/2024    Expiration Date:   01/03/2025   CA 27.29    Standing Status:   Future    Expected Date:   01/04/2024    Expiration Date:   01/03/2025   CBC with Differential (Cancer Center Only)    Standing Status:   Future    Expected Date:   01/25/2024    Expiration Date:   01/24/2025   CMP (Cancer Center only)    Standing Status:   Future    Expected Date:   01/25/2024    Expiration Date:   01/24/2025   [place] 27.29    Standing Status:   Future    Expected Date:   01/25/2024    Expiration Date:   01/24/2025  All questions were answered. The patient knows to call the clinic with any problems, questions or concerns. No barriers to learning was detected. The total time spent in the appointment was 25 minutes, including review of chart and various tests results, discussions about plan of care and coordination of care plan     Onita Mattock, MD 11/22/2023

## 2023-11-23 ENCOUNTER — Other Ambulatory Visit: Payer: Self-pay

## 2023-11-23 LAB — CANCER ANTIGEN 27.29: CA 27.29: 44.7 U/mL — ABNORMAL HIGH (ref 0.0–38.6)

## 2023-11-29 ENCOUNTER — Ambulatory Visit
Admission: RE | Admit: 2023-11-29 | Discharge: 2023-11-29 | Disposition: A | Discharge status: Home / Self Care | Source: Ambulatory Visit | Attending: Hematology | Admitting: Hematology

## 2023-11-29 DIAGNOSIS — C50911 Malignant neoplasm of unspecified site of right female breast: Secondary | ICD-10-CM

## 2023-11-30 ENCOUNTER — Other Ambulatory Visit: Payer: Self-pay

## 2023-12-14 ENCOUNTER — Inpatient Hospital Stay: Attending: Hematology

## 2023-12-14 VITALS — BP 122/73 | HR 89 | Temp 97.7°F | Resp 18

## 2023-12-14 DIAGNOSIS — Z1731 Human epidermal growth factor receptor 2 positive status: Secondary | ICD-10-CM | POA: Insufficient documentation

## 2023-12-14 DIAGNOSIS — C50411 Malignant neoplasm of upper-outer quadrant of right female breast: Secondary | ICD-10-CM | POA: Insufficient documentation

## 2023-12-14 DIAGNOSIS — Z5111 Encounter for antineoplastic chemotherapy: Secondary | ICD-10-CM | POA: Diagnosis present

## 2023-12-14 DIAGNOSIS — Z1721 Progesterone receptor positive status: Secondary | ICD-10-CM | POA: Diagnosis not present

## 2023-12-14 DIAGNOSIS — Z170 Estrogen receptor positive status [ER+]: Secondary | ICD-10-CM | POA: Insufficient documentation

## 2023-12-14 DIAGNOSIS — C7951 Secondary malignant neoplasm of bone: Secondary | ICD-10-CM | POA: Diagnosis not present

## 2023-12-14 MED ORDER — DIPHENHYDRAMINE HCL 25 MG PO CAPS
50.0000 mg | ORAL_CAPSULE | Freq: Once | ORAL | Status: AC
  Administered 2023-12-14: 50 mg via ORAL
  Filled 2023-12-14: qty 2

## 2023-12-14 MED ORDER — PERTUZ-TRASTUZ-HYALURON-ZZXF 60-60-2000 MG-MG-U/ML CHEMO ~~LOC~~ SOLN
600.0000 mg | Freq: Once | SUBCUTANEOUS | Status: AC
  Administered 2023-12-14: 600 mg via SUBCUTANEOUS
  Filled 2023-12-14: qty 10

## 2023-12-14 MED ORDER — ACETAMINOPHEN 325 MG PO TABS
650.0000 mg | ORAL_TABLET | Freq: Once | ORAL | Status: AC
  Administered 2023-12-14: 650 mg via ORAL
  Filled 2023-12-14: qty 2

## 2023-12-14 NOTE — Patient Instructions (Signed)
 [facility] A DEPT OF [facility]  [facility]  Discharge Instructions: Thank you for choosing [facility] to provide your oncology and hematology care.   If you have a lab appointment with the Cancer Center, please go directly to the Cancer Center and check in at the registration area.   Wear comfortable clothing and clothing appropriate for easy access to any Portacath or PICC line.   We strive to give you quality time with your provider. You may need to reschedule your appointment if you arrive late (15 or more minutes).  Arriving late affects you and other patients whose appointments are after yours.  Also, if you miss three or more appointments without notifying the office, you may be dismissed from the clinic at the provider's discretion.      For prescription refill requests, have your pharmacy contact our office and allow 72 hours for refills to be completed.    Today you received the following chemotherapy and/or immunotherapy agents: phesgo   Pertuzumab ; Trastuzumab ; Hyaluronidase Injection What is this medication? PERTUZUMAB ; TRASTUZUMAB ; HYALURONIDASE (per TOOZ ue mab; tras TOO zoo mab; hye al ur ON i dase) treats breast cancer. Pertuzumab  and trastuzumab  work by blocking a protein that causes cancer cells to grow and multiply. This helps to slow or stop the spread of cancer cells. Hyaluronidase works by increasing the absorption of other medications in the body to help them work better. It is a combination medication that contains two monoclonal antibodies. This medicine may be used for other purposes; ask your health care provider or pharmacist if you have questions. COMMON BRAND NAME(S): PHESGO  What should I tell my care team before I take this medication? They need to know if you have any of these conditions: Heart failure High blood pressure Irregular heartbeat or rhythm Lung disease An unusual or allergic reaction to pertuzumab ,  trastuzumab , hyaluronidase, other medications, foods, dyes, or preservatives Pregnant or trying to get pregnant Breast-feeding How should I use this medication? This medication is injected under the skin. It is usually given by your care team in a hospital or clinic setting. It may also be given at home by your care team. Talk to your care team about the use of this medication in children. Special care may be needed. Overdosage: If you think you have taken too much of this medicine contact a poison control center or emergency room at once. NOTE: This medicine is only for you. Do not share this medicine with others. What if I miss a dose? Keep appointments for follow-up doses. It is important not to miss your dose. Call your care team if you are unable to keep an appointment. What may interact with this medication? Certain types of chemotherapy, such as daunorubicin, doxorubicin, epirubicin, idarubicin This list may not describe all possible interactions. Give your health care provider a list of all the medicines, herbs, non-prescription drugs, or dietary supplements you use. Also tell them if you smoke, drink alcohol, or use illegal drugs. Some items may interact with your medicine. What should I watch for while using this medication? Your condition will be monitored carefully while you are receiving this medication. This medication may make you feel generally unwell. This is not uncommon as chemotherapy can affect healthy cells as well as cancer cells. Report any side effects. Continue your course of treatment even though you feel ill unless your care team tells you to stop. This medication may increase your risk of getting an infection. Call  your care team for advice if you get a fever, chills, sore throat, or other symptoms of a cold or flu. Do not treat yourself. Try to avoid being around people who are sick. Avoid taking medications that contain aspirin, acetaminophen , ibuprofen, naproxen, or  ketoprofen unless instructed by your care team. These medications may hide a fever. Talk to your care team if you may be pregnant. Serious birth defects can occur if you take this medication during pregnancy and for 7 months after the last dose. You will need a negative pregnancy test before starting this medication. Contraception is recommended while taking this medication and for 7 months after the last dose. Your care team can help you find the option that works for you. Do not breastfeed while taking this medication and for 7 months after the last dose. What side effects may I notice from receiving this medication? Side effects that you should report to your care team as soon as possible: Allergic reactions or angioedema--skin rash, itching or hives, swelling of the face, eyes, lips, tongue, arms, or legs, trouble swallowing or breathing Dry cough, shortness of breath or trouble breathing Heart failure--shortness of breath, swelling of the ankles, feet, or hands, sudden weight gain, unusual weakness or fatigue Infection--fever, chills, cough, or sore throat Infusion reactions--chest pain, shortness of breath or trouble breathing, feeling faint or lightheaded Side effects that usually do not require medical attention (report these to your care team if they continue or are bothersome): Diarrhea Hair loss Nausea Pain, redness, or irritation at injection site Pain, redness, or swelling with sores inside the mouth or throat Unusual weakness or fatigue This list may not describe all possible side effects. Call your doctor for medical advice about side effects. You may report side effects to FDA at 1-800-FDA-1088. Where should I keep my medication? This medication is given in a hospital or clinic. It will not be stored at home. NOTE: This sheet is a summary. It may not cover all possible information. If you have questions about this medicine, talk to your doctor, pharmacist, or health care  provider.  2024 Elsevier/Gold Standard (2021-08-26 00:00:00)      To help prevent nausea and vomiting after your treatment, we encourage you to take your nausea medication as directed.  BELOW ARE SYMPTOMS THAT SHOULD BE REPORTED IMMEDIATELY: *FEVER GREATER THAN 100.4 F (38 C) OR HIGHER *CHILLS OR SWEATING *NAUSEA AND VOMITING THAT IS NOT CONTROLLED WITH YOUR NAUSEA MEDICATION *UNUSUAL SHORTNESS OF BREATH *UNUSUAL BRUISING OR BLEEDING *URINARY PROBLEMS (pain or burning when urinating, or frequent urination) *BOWEL PROBLEMS (unusual diarrhea, constipation, pain near the anus) TENDERNESS IN MOUTH AND THROAT WITH OR WITHOUT PRESENCE OF ULCERS (sore throat, sores in mouth, or a toothache) UNUSUAL RASH, SWELLING OR PAIN  UNUSUAL VAGINAL DISCHARGE OR ITCHING   Items with * indicate a potential emergency and should be followed up as soon as possible or go to the Emergency Department if any problems should occur.  Please show the CHEMOTHERAPY ALERT CARD or IMMUNOTHERAPY ALERT CARD at check-in to the Emergency Department and triage nurse.  Should you have questions after your visit or need to cancel or reschedule your appointment, please contact [facility] - A DEPT OF [facility]  Dept: [phone]  and follow the prompts.  Office hours are 8:00 a.m. to 4:30 p.m. Monday - Friday. Please note that voicemails left after 4:00 p.m. may not be returned until the following business day.  We are closed weekends  and major holidays. You have access to a nurse at all times for urgent questions. Please call the main number to the clinic Dept: [phone] and follow the prompts.   For any non-urgent questions, you may also contact your provider using MyChart. We now offer e-Visits for anyone 64 and older to request care online for non-urgent symptoms. For details visit [email or web address].   Also download the MyChart app! Go to the app store, search MyChart, open the  app, select [facility], and log in with your MyChart username and password.

## 2023-12-28 ENCOUNTER — Other Ambulatory Visit: Payer: Self-pay | Admitting: Nurse Practitioner

## 2023-12-31 ENCOUNTER — Ambulatory Visit (HOSPITAL_COMMUNITY)
Admission: RE | Admit: 2023-12-31 | Discharge: 2023-12-31 | Disposition: A | Discharge status: Home / Self Care | Source: Ambulatory Visit | Attending: Hematology | Admitting: Hematology

## 2023-12-31 ENCOUNTER — Encounter (HOSPITAL_COMMUNITY): Payer: Self-pay

## 2023-12-31 ENCOUNTER — Encounter: Payer: Self-pay | Admitting: Hematology

## 2023-12-31 DIAGNOSIS — C50411 Malignant neoplasm of upper-outer quadrant of right female breast: Secondary | ICD-10-CM | POA: Insufficient documentation

## 2023-12-31 DIAGNOSIS — Z170 Estrogen receptor positive status [ER+]: Secondary | ICD-10-CM | POA: Insufficient documentation

## 2023-12-31 MED ORDER — TECHNETIUM TC 99M MEDRONATE IV KIT
20.0000 | PACK | Freq: Once | INTRAVENOUS | Status: AC | PRN
Start: 2023-12-31 — End: 2023-12-31
  Administered 2023-12-31: 21.4 via INTRAVENOUS

## 2023-12-31 MED ORDER — SODIUM CHLORIDE (PF) 0.9 % IJ SOLN
INTRAMUSCULAR | Status: AC
  Filled 2023-12-31: qty 50

## 2023-12-31 MED ORDER — IOHEXOL 300 MG/ML  SOLN
100.0000 mL | Freq: Once | INTRAMUSCULAR | Status: AC | PRN
  Administered 2023-12-31: 100 mL via INTRAVENOUS

## 2024-01-02 NOTE — Assessment & Plan Note (Signed)
 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 Left axillary node biopsy was benign.  -01/01/20 Liver biopsy was negative but 01/12/20 Bone Biopsy was positive for metastatic carcinoma from primary breast cancer. ER/PR/HER2 were all negative, possibly related to decalcification. -She received THP 01/03/20 - 2/3/2 -on maintenance HP Phesgo  injection every 3 weeks -last staging scan in 08/2020, not repeated due to lack of insurance and she is clinically doing well  -repeated CT and bone scan 05/05/2022 showed good partial response in liver, minimum uptake in bone mets, no concerns for new lesion or evidence of progression.  I personally reviewed his scan images and discussed with patient. -Will continue Phesgo  injection every 3 weeks and letrozole , she is tolerating well  -CT 07/02/2023 was negative for residual disease

## 2024-01-02 NOTE — Assessment & Plan Note (Signed)
-  from breast cancer  --bone scan on 12/10/19 showed metastatic disease in left acetabulum, L3, and right 10th rib.  --Zometa  started 06/18/20, but held after second dose due to loss of insurance. Restarted in 05/2022

## 2024-01-03 ENCOUNTER — Inpatient Hospital Stay: Attending: Hematology

## 2024-01-03 ENCOUNTER — Inpatient Hospital Stay (HOSPITAL_BASED_OUTPATIENT_CLINIC_OR_DEPARTMENT_OTHER): Admitting: Hematology

## 2024-01-03 ENCOUNTER — Inpatient Hospital Stay

## 2024-01-03 VITALS — BP 120/72 | HR 81 | Temp 97.4°F | Resp 15 | Ht 67.0 in | Wt 208.3 lb

## 2024-01-03 VITALS — BP 114/67 | HR 72 | Temp 98.0°F

## 2024-01-03 DIAGNOSIS — C50411 Malignant neoplasm of upper-outer quadrant of right female breast: Secondary | ICD-10-CM | POA: Diagnosis not present

## 2024-01-03 DIAGNOSIS — Z5111 Encounter for antineoplastic chemotherapy: Secondary | ICD-10-CM | POA: Diagnosis present

## 2024-01-03 DIAGNOSIS — Z79811 Long term (current) use of aromatase inhibitors: Secondary | ICD-10-CM | POA: Insufficient documentation

## 2024-01-03 DIAGNOSIS — C7951 Secondary malignant neoplasm of bone: Secondary | ICD-10-CM | POA: Diagnosis not present

## 2024-01-03 DIAGNOSIS — Z170 Estrogen receptor positive status [ER+]: Secondary | ICD-10-CM

## 2024-01-03 DIAGNOSIS — Z1721 Progesterone receptor positive status: Secondary | ICD-10-CM | POA: Insufficient documentation

## 2024-01-03 DIAGNOSIS — Z1731 Human epidermal growth factor receptor 2 positive status: Secondary | ICD-10-CM | POA: Diagnosis not present

## 2024-01-03 LAB — CMP (CANCER CENTER ONLY)
ALT: 13 U/L (ref 0–44)
AST: 16 U/L (ref 15–41)
Albumin: 4.3 g/dL (ref 3.5–5.0)
Alkaline Phosphatase: 79 U/L (ref 38–126)
Anion gap: 5 (ref 5–15)
BUN: 16 mg/dL (ref 8–23)
CO2: 26 mmol/L (ref 22–32)
Calcium: 9.2 mg/dL (ref 8.9–10.3)
Chloride: 110 mmol/L (ref 98–111)
Creatinine: 0.85 mg/dL (ref 0.44–1.00)
GFR, Estimated: >60 mL/min (ref >60)
Glucose, Bld: 99 mg/dL (ref 70–99)
Potassium: 3.8 mmol/L (ref 3.5–5.1)
Sodium: 141 mmol/L (ref 135–145)
Total Bilirubin: 0.4 mg/dL (ref 0.0–1.2)
Total Protein: 7.1 g/dL (ref 6.5–8.1)

## 2024-01-03 LAB — CBC WITH DIFFERENTIAL (CANCER CENTER ONLY)
Abs Immature Granulocytes: 0.01 K/uL (ref 0.00–0.07)
Basophils Absolute: 0 K/uL (ref 0.0–0.1)
Basophils Relative: 0 %
Eosinophils Absolute: 0.2 K/uL (ref 0.0–0.5)
Eosinophils Relative: 3 %
HCT: 39.3 % (ref 36.0–46.0)
Hemoglobin: 12.8 g/dL (ref 12.0–15.0)
Immature Granulocytes: 0 %
Lymphocytes Relative: 49 %
Lymphs Abs: 2.6 K/uL (ref 0.7–4.0)
MCH: 29.1 pg (ref 26.0–34.0)
MCHC: 32.6 g/dL (ref 30.0–36.0)
MCV: 89.3 fL (ref 80.0–100.0)
Monocytes Absolute: 0.5 K/uL (ref 0.1–1.0)
Monocytes Relative: 10 %
Neutro Abs: 2 K/uL (ref 1.7–7.7)
Neutrophils Relative %: 38 %
Platelet Count: 298 K/uL (ref 150–400)
RBC: 4.4 MIL/uL (ref 3.87–5.11)
RDW: 13.6 % (ref 11.5–15.5)
WBC Count: 5.2 K/uL (ref 4.0–10.5)
nRBC: 0 % (ref 0.0–0.2)

## 2024-01-03 MED ORDER — SODIUM CHLORIDE 0.9 % IV SOLN: Freq: Once | INTRAVENOUS | Status: DC

## 2024-01-03 MED ORDER — ACETAMINOPHEN 325 MG PO TABS
650.0000 mg | ORAL_TABLET | Freq: Once | ORAL | Status: AC
  Administered 2024-01-03: 650 mg via ORAL
  Filled 2024-01-03: qty 2

## 2024-01-03 MED ORDER — PERTUZ-TRASTUZ-HYALURON-ZZXF 60-60-2000 MG-MG-U/ML CHEMO ~~LOC~~ SOLN
600.0000 mg | Freq: Once | SUBCUTANEOUS | Status: AC
  Administered 2024-01-03: 600 mg via SUBCUTANEOUS
  Filled 2024-01-03: qty 10

## 2024-01-03 MED ORDER — DIPHENHYDRAMINE HCL 25 MG PO CAPS
50.0000 mg | ORAL_CAPSULE | Freq: Once | ORAL | Status: AC
  Administered 2024-01-03: 50 mg via ORAL
  Filled 2024-01-03: qty 2

## 2024-01-03 NOTE — Patient Instructions (Signed)
 [facility]  Discharge Instructions: Thank you for choosing [facility] to provide your oncology and hematology care.   If you have a lab appointment with the Cancer Center, please go directly to the Cancer Center and check in at the registration area.   Wear comfortable clothing and clothing appropriate for easy access to any Portacath or PICC line.   We strive to give you quality time with your provider. You may need to reschedule your appointment if you arrive late (15 or more minutes).  Arriving late affects you and other patients whose appointments are after yours.  Also, if you miss three or more appointments without notifying the office, you may be dismissed from the clinic at the provider's discretion.      For prescription refill requests, have your pharmacy contact our office and allow 72 hours for refills to be completed.    Today you received the following chemotherapy and/or immunotherapy agents: Phesgo.      To help prevent nausea and vomiting after your treatment, we encourage you to take your nausea medication as directed.  BELOW ARE SYMPTOMS THAT SHOULD BE REPORTED IMMEDIATELY: *FEVER GREATER THAN 100.4 F (38 C) OR HIGHER *CHILLS OR SWEATING *NAUSEA AND VOMITING THAT IS NOT CONTROLLED WITH YOUR NAUSEA MEDICATION *UNUSUAL SHORTNESS OF BREATH *UNUSUAL BRUISING OR BLEEDING *URINARY PROBLEMS (pain or burning when urinating, or frequent urination) *BOWEL PROBLEMS (unusual diarrhea, constipation, pain near the anus) TENDERNESS IN MOUTH AND THROAT WITH OR WITHOUT PRESENCE OF ULCERS (sore throat, sores in mouth, or a toothache) UNUSUAL RASH, SWELLING OR PAIN  UNUSUAL VAGINAL DISCHARGE OR ITCHING   Items with * indicate a potential emergency and should be followed up as soon as possible or go to the Emergency Department if any problems should occur.  Please show the CHEMOTHERAPY ALERT CARD or IMMUNOTHERAPY  ALERT CARD at check-in to the Emergency Department and triage nurse.  Should you have questions after your visit or need to cancel or reschedule your appointment, please contact [facility] - A DEPT OF [facility]  Dept: [phone]  and follow the prompts.  Office hours are 8:00 a.m. to 4:30 p.m. Monday - Friday. Please note that voicemails left after 4:00 p.m. may not be returned until the following business day.  We are closed weekends and major holidays. You have access to a nurse at all times for urgent questions. Please call the main number to the clinic Dept: [phone] and follow the prompts.   For any non-urgent questions, you may also contact your provider using MyChart. We now offer e-Visits for anyone 2 and older to request care online for non-urgent symptoms. For details visit [email or web address].   Also download the MyChart app! Go to the app store, search "MyChart", open the app, select [place], and log in with your MyChart username and password.

## 2024-01-03 NOTE — Progress Notes (Signed)
 Zometa  changed to q24week dosing per Dr. Lanny.  Marrell Dicaprio, PharmD, MBA

## 2024-01-03 NOTE — Progress Notes (Signed)
 Patient observed for 15 min following Phesgo  injection.  Tolerated treatment well without incident.  VSS at discharge.  Ambulated to lobby.

## 2024-01-03 NOTE — Progress Notes (Signed)
 [facility]   Telephone:([phone]) [phone] Fax:([phone]) [phone]   Clinic Follow up Note   Patient Care Team: Asuncion Setter, PA-C as PCP - General (Physician Assistant) Aron Shoulders, MD as Consulting Physician (General Surgery) Lanny Callander, MD as Consulting Physician (Hematology) Dewey Rush, MD as Consulting Physician (Radiation Oncology) Tyree Nanetta SAILOR, RN as Oncology Nurse Navigator  Date of Service:  01/03/2024  CHIEF COMPLAINT: f/u of cancer  CURRENT THERAPY:  Letrozole  and Phesgo  injection every 3 weeks  Oncology History   Metastasis to bone [facility]) -from breast cancer  --bone scan on 12/10/19 showed metastatic disease in left acetabulum, L3, and right 10th rib.  --Zometa  started 06/18/20, but held after second dose due to loss of insurance. Restarted in 05/2022    Malignant neoplasm of upper-outer quadrant of right breast in female, estrogen receptor positive (HCC) stage IV r([id number]), with bone and possible liver mets,  ER+/PR+/HER2+, Grade II-III   --diagnosed in 11/2019 with large right breast mass measuring up to 11cm and right lymphadenopathy. 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 Left axillary node biopsy was benign.  -01/01/20 Liver biopsy was negative but 01/12/20 Bone Biopsy was positive for metastatic carcinoma from primary breast cancer. ER/PR/HER2 were all negative, possibly related to decalcification. -She received THP 01/03/20 - 2/3/2 -on maintenance HP Phesgo  injection every 3 weeks -last staging scan in 08/2020, not repeated due to lack of insurance and she is clinically doing well  -repeated CT and bone scan 05/05/2022 showed good partial response in liver, minimum uptake in bone mets, no concerns for new lesion or evidence of progression.  I personally reviewed his scan images and discussed with patient. -Will continue Phesgo  injection every 3 weeks and letrozole , she is tolerating well  -CT  07/02/2023 was negative for residual disease   Assessment & Plan Metastatic breast cancer with bone involvement Metastatic breast cancer with bone involvement is well-managed. No new metastatic disease on bone scan. CT scan shows overall stable disease with no suspicious findings. Tumor markers are stable in the 40 range. Treatment is well-tolerated with manageable hot flashes. Kidney and liver functions are normal. Echocardiogram was last done in June, and cardiology follow-up is scheduled for December. She is currently on cycle 27 of treatment. - Continue current treatment regimen including injections and Latrida. - Ensure adequate hydration to maintain normal kidney function. - Schedule and attend cardiology follow-up in December. - Schedule next few treatment appointments every three cycles. - Encourage finding a new primary care physician for routine health maintenance and screenings. - Discussed the option of circulating tumor DNA testing after five years of treatment to assess for detectable disease, though the test is not 100% accurate and may yield false positives or negatives.  Plan - CT and bone scan reviewed, no active disease on images.   -She is clinically doing very well, asymptomatic, tolerating treatment well - Will proceed Phesgo  injection today and continue every 3 weeks, repeat echo every 6 months by her cardiologist - Continue letrozole  -Changed Zometa  to every 6 months, next due around 05/24/2024 - Follow-up in 9 weeks   SUMMARY OF ONCOLOGIC HISTORY: Oncology History Overview Note  Cancer Staging Malignant neoplasm of upper-outer quadrant of right breast in female, estrogen receptor positive (HCC) Staging form: Breast, AJCC 8th Edition - Clinical stage from 11/25/2019: Stage IIA (cT3, cN1, cM0, G2, ER+, PR+, HER2+) - Signed by Lanny Callander, MD on 12/03/2019    Malignant neoplasm of upper-outer  quadrant of right breast in female, estrogen receptor positive (HCC)  11/13/2019  Mammogram    Diagnostic Mammogram 11/13/19  IMPRESSION: -Highly suspicious mass and calcifications in the right breast centered between 9 and 12 o'clock spanning up to 11 cm.  -Multiple masses in the right axilla. One of the masses contains calcifications, denoted as mass number 2 measuring 9 x 11 by 10 mm. Several of these masses do not appear to represent definitive lymph nodes. Others may be small but abnormal appearing lymph nodes. Taken in total, a cluster of masses in the right axilla spans up to 3.7 cm.  -There are fibrocystic changes on the left. There are also some solid-appearing masses. A solid appearing mass at 10:30, 6 cm from the nipple measures 9 x 6 x 6 mm. An adjacent solid mass at 10 o'clock, 6 cm from the nipple measures 8 x 6 by 5 mm. Another solid-appearing mass at 11 o'clock, 1 cm from the nipple measures 5 x 4 by 5 mm. Fibrocystic changes are seen at 10 o'clock, 12 o'clock, and 4 o'clock. Another solid-appearing mass seen at 5 o'clock, 4 cm from the nipple measuring 6 x 3 x 4 mm. There is a single abnormal node in the left axilla with a cortex measuring 5 mm and restriction of the fatty hilum.   11/25/2019 Initial Biopsy   Diagnosis 1. Breast, right, needle core biopsy, upper outer - INVASIVE MAMMARY CARCINOMA - MAMMARY CARCINOMA IN SITU WITH CALCIFICATIONS AND NECROSIS - SEE COMMENT 2. Lymph node, needle/core biopsy, right axilla - INVASIVE MAMMARY CARCINOMA - SEE COMMENT 3. Lymph node, needle/core biopsy, left axilla - BENIGN LYMPH NODE - NO CARCINOMA IDENTIFIED Microscopic Comment 1. The biopsy material shows an infiltrative proliferation of cells arranged linearly and in small clusters. Based on the biopsy, the carcinoma appears Nottingham grade 2 of 3 and measures 1.2 cm in greatest linear extent. E-cadherin and prognostic markers (ER/PR/ki-67/HER2)are pending and will be reported in an addendum. Dr. Alvaro reviewed the case and agrees with the above  diagnosis. These results were called to [facility] on November 26, 2019. 2. Definitive nodal tissue is not identified. This biopsy has a slightly different architecture morphology than what is seen in part 1. The carcinoma measures 0.7 cm in greatest dimension. E-cadherin and prognostic markers (ER, PR, Ki-67 and HER-2) are pending and will be reported in an addendum.   11/25/2019 Receptors her2   1. PROGNOSTIC INDICATORS Results: IMMUNOHISTOCHEMICAL AND MORPHOMETRIC ANALYSIS PERFORMED MANUALLY The tumor cells are POSITIVE for Her2 (3+). Estrogen Receptor: 90%, POSITIVE, STRONG STAINING INTENSITY Progesterone Receptor: 20%, POSITIVE, STRONG STAINING INTENSITY Proliferation Marker Ki67: 30%  2. PROGNOSTIC INDICATORS Results: IMMUNOHISTOCHEMICAL AND MORPHOMETRIC ANALYSIS PERFORMED MANUALLY The tumor cells are POSITIVE for Her2 (3+). Estrogen Receptor: 90%, POSITIVE, STRONG STAINING INTENSITY Progesterone Receptor: 25%, POSITIVE, STRONG STAINING INTENSITY Proliferation Marker Ki67: 30%   11/25/2019 Cancer Staging   Staging form: Breast, AJCC 8th Edition - Clinical stage from 11/25/2019: Stage IIA (cT3, cN1, cM0, G2, ER+, PR+, HER2+) - Signed by Lanny Callander, MD on 12/03/2019   12/01/2019 Initial Diagnosis   Malignant neoplasm of upper-outer quadrant of right breast in female, estrogen receptor positive (HCC)   12/10/2019 Imaging   Bone Scan  IMPRESSION: Findings concerning for bony metastatic disease in the left acetabulum region, L3 vertebral body region, and anterolateral right tenth rib.   Increased uptake in several large joints likely is of arthropathic etiology.   12/11/2019 Imaging   CT CAP w  contrast  IMPRESSION: Prominent glandular tissue in the central right breast with nipple retraction, likely corresponding to the patient's known right breast neoplasm.   Prominent right axillary nodes measuring up to 8 mm short axis, suspicious for nodal metastases in this  clinical context.   3 mm subpleural pulmonary nodule in the right lower lobe, likely benign. However, follow-up CT chest is suggested in 3-6 months.   Multiple hepatic lesions, including a dominant 2.4 cm lesion in segment 6, which is considered indeterminate. Metastasis is not excluded. Consider MRI abdomen with/without contrast for further characterization.   11 mm sclerotic lesion along the left posterior aspect of the T7 vertebral body raises concern for isolated osseous metastasis. Correlate with priors if available. Benign vertebral hemangioma at T11.   12/12/2019 Breast MRI   IMPRESSION: 1. The biopsy proven malignancy in the right breast involves all 4 quadrants and spans up to 9 cm by MRI. There is enhancement within the skin at the level of the nipple.   2. Multiple matted masses are seen in the right axilla, either abnormal metastatic lymph nodes or a combination of masses and metastatic lymph nodes. One of these has been previously biopsied showing invasive mammary carcinoma without definitive nodal tissue.   3.  There is a 1.5 cm Rotter's lymph node on the right.   4. There is markedly abnormal enhancement and edema in the right pectoralis major muscle spanning 6-7 cm, suspicious for metastatic disease.   5. There is diffuse nodular enhancement throughout the left breast, which may correspond with the solid-appearing masses seen on ultrasound. These all have a similar appearance on MRI.   6.  No findings of left axillary lymphadenopathy.   12/15/2019 Procedure   PAC placed    12/24/2019 Imaging   MRI abdomen  IMPRESSION: 1. Multifocal enhancing bone lesions are noted within the lumbar spine, and bony pelvis compatible with metastatic disease. 2. Four, small peripherally enhancing cystic lesions are noted within the liver worrisome for metastatic disease. 3. 2 enhancing liver lesions are also noted with signal and enhancement characteristics consistent with  benign liver hemangioma. 4. Well-circumscribed, enhancing T2 and T1 hypointense structure within the right adnexal region is indeterminate. It is unclear whether not this is arising from the right ovary, broad ligament or right side of uterine fundus. Differential considerations include fibrothecoma, versus pedunculated uterine leiomyoma. Metastatic disease is considered less favored. Attention on follow-up imaging is recommended.     01/03/2020 - 05/27/2020 Chemotherapy   First line THP q3weeks starting 01/03/20-05/27/20   01/05/2020 Imaging   US  Abdomen  IMPRESSION: 1. Suspect hematoma in the medial right lobe of the liver in the area of previous biopsy. Note that recent MR does not show lesion in this area. This area has ill-defined margins and measures 3.9 x 4.0 x 3.5 cm. No perihepatic fluid.   2. Probable hemangiomas elsewhere in the right lobe of the liver. Note that several smaller lesions seen on recent MR are not appreciable by ultrasound.   3.  Study otherwise unremarkable.   01/12/2020 Pathology Results   FINAL MICROSCOPIC DIAGNOSIS:   A. BONE, SCLEROTIC LESION, LEFT ANTERIOR ILIAC SPINE, BIOPSY:  - Metastatic carcinoma, consistent with patient's clinical history of  primary breast carcinoma  - See comment   COMMENT:  Immunohistochemical stains show that the tumor cells are positive for  CK7 and GATA3; and negative for CK20, consistent with above  interpretation.   PROGNOSTIC INDICATOR RESULTS:  The tumor cells are NEGATIVE for Her2 (  0); see comment  Estrogen Receptor: NEGATIVE; see comment  Progesterone Receptor: NEGATIVE; see comment    03/23/2020 Imaging   CT CAP IMPRESSION: 1. Right lower lobe perifissural nodule is slightly more conspicuous on today's study. Close attention on follow-up recommended. 2. Interval progression of segment IV liver lesion, concerning for metastatic progression. The remaining visualized liver lesions are stable to smaller in the  interval. 3. Interval evolution in appearance of thoracolumbar spinal lesions and left iliac crest lesion, potentially reflecting response to therapy/healing. No definite new osseous lesions on today's study. 4. New small fluid collection right cul-de-sac. 5. Probable fibroid change in the uterus including exophytic right fundal lesion. 6. Aortic Atherosclerosis (ICD10-I70.0).   03/31/2020 Imaging   MR ABD IMPRESSION: 1. Enlarging hepatic lesion in hepatic subsegment IV suspicious for hepatic metastasis. The it is uncertain whether the change in the short interval is due to technical factors with different modalities or true enlargement. 2. Lesion in the posterior RIGHT hepatic lobe with imaging features that are atypical for hemangioma but stability and signs of enhancement on later phase raising this question. Another more classic appearing may angioma seen inferior to this location in hepatic subsegment VI. Attention on follow-up. 3. Heterogeneous enhancement throughout the spine particularly at L3 as exhibited on the prior study with very patchy marrow signal remains suspicious for bony metastatic lesions in this patient with known bone metastases.   04/01/2020 Imaging   Bone scan IMPRESSION: Good response to therapy with resolution of sites of uptake seen previously at the cervical spine and posterior RIGHT tenth rib as well as a diminished degree of uptake at remaining previously identified osseous metastatic foci.   04/28/2020 PET scan   IMPRESSION: 1. No hypermetabolic lesions to suggest active metastatic disease involving the liver or osseous structures. 2. Stable small low-attenuation liver lesions, likely treated disease.   06/18/2020 -  Chemotherapy   Given her excellent response, I switched to maintenance therapy with Herceptin , Perjeta  (Phesgo ) q3weeks and Letrozole .   06/18/2020 -  Chemotherapy   Zometa  starting 06/18/20   06/18/2020 -  Anti-estrogen oral therapy    Letrozole  2.5mg  once dialy.    06/18/2020 - 06/16/2022 Chemotherapy   Patient is on Treatment Plan : BREAST Trastuzumab  + Pertuzumab  q21d     09/03/2020 Imaging   Bone Scan IMPRESSION: 1. Significant interval improvement compared to previous studies. Only mild uptake remains in the pelvis as above. There has been significant interval improvement over time.  CT C/A/P IMPRESSION: 1. Hypodense liver lesions are slightly decreased in size compared to prior examination, consistent with treatment response of hepatic metastatic disease. 2. Unchanged sclerotic lesion of the anterior left iliac crest. Unchanged lytic superior endplate deformity of L3, which remains equivocal for metastatic lesion versus mechanical Schmorl deformity. No CT evidence of new osseous metastatic disease. 3. No evidence of new metastatic disease in the chest, abdomen, or pelvis. 4. Coronary artery disease.   05/05/2022 Imaging    IMPRESSION: 1. Interval decreased size of the largest hepatic metastatic lesion with stable size of the additional subcentimeter bilobar hepatic lesions. Continued decrease in size of the bilobar hepatic metastases. 2. Similar appearance of the osseous metatatic disease. No new aggressive lytic or blastic lesion of bone. 3. No evidence of new or progressive metastatic disease in the chest, abdomen or pelvis.     05/05/2022 Imaging    IMPRESSION: Faint residual radiotracer uptake in the left hemipelvis.   New focus of mild abnormal radiotracer uptake in the right lateral  seventh rib without discrete CT correlate on same day examination, nonspecific suggest correlation with history of interval trauma.     07/07/2022 -  Chemotherapy   Patient is on Treatment Plan : BREAST Trastuzumab  + Pertuzumab  q21d     02/28/2023 Echocardiogram   IMPRESSIONS   1. Left ventricular ejection fraction, by estimation, is 55 to 60%. The left ventricle has normal function. The left ventricle has no  regional wall motion abnormalities. Left ventricular diastolic parameters are consistent with Grade I diastolic  dysfunction (impaired relaxation). The average left ventricular global longitudinal strain is -17.1 %. The global longitudinal strain is normal.   2. Right ventricular systolic function is normal. The right ventricular size is normal. There is normal pulmonary artery systolic pressure. The estimated right ventricular systolic pressure is 28.8 mmHg.   3. The mitral valve is normal in structure. No evidence of mitral valve regurgitation. No evidence of mitral stenosis.   4. The aortic valve is tricuspid. Aortic valve regurgitation is trivial. No aortic stenosis is present.   5. The inferior vena cava is normal in size with greater than 50% respiratory variability, suggesting right atrial pressure of 3 mmHg.  Comparison(s): No significant change from prior study. Prior GLS: -18.0.      07/02/2023 Imaging   CT CAP with contrast  IMPRESSION: CHEST:  No evidence of breast cancer metastasis in the chest.  PELVIS:  1. No evidence recurrent breast cancer within liver. Stable small hypodensities. 2. Previous described metastatic skeletal lesions are not well appreciated. 3. No new or progressive disease.      Discussed the use of AI scribe software for clinical note transcription with the patient, who gave verbal consent to proceed.  History of Present Illness Carolyn Stewart is a 63 year old female with metastatic breast cancer who presents for follow-up.  She has been undergoing treatment for metastatic breast cancer since 2021, with no pain and a good appetite. She is actively trying to lose weight and has lost a few pounds intentionally. Her current treatment regimen includes injections and Latrida, with no reported issues.  She experiences frequent hot flashes but generally sleeps well. She takes potassium once a day. Her last echocardiogram was in June, and she is scheduled  to see her cardiologist in December for routine follow-up.  Her blood pressure is reportedly good, and her blood sugar today is normal at 99, although she was not fasting. Her last mammogram in August was negative.     All other systems were reviewed with the patient and are negative.  MEDICAL HISTORY:  Past Medical History:  Diagnosis Date   Cancer (HCC) 2021   Hypertension     SURGICAL HISTORY: Past Surgical History:  Procedure Laterality Date   left parotid tumor removal   2006   PORTACATH PLACEMENT Left 12/15/2019   Procedure: INSERTION PORT-A-CATH WITH ULTRASOUND GUIDANCE;  Surgeon: Aron Shoulders, MD;  Location: [facility] OR;  Service: General;  Laterality: Left;   TONSILLECTOMY      I have reviewed the social history and family history with the patient and they are unchanged from previous note.  ALLERGIES:  has no known allergies.  MEDICATIONS:  Current Outpatient Medications  Medication Sig Dispense Refill   acetaminophen  (TYLENOL ) 500 MG tablet Take 1,000 mg by mouth every 6 (six) hours as needed for moderate pain or headache.     amLODipine  (NORVASC ) 10 MG tablet Take 2 tablets (20 mg total) by mouth daily. 60 tablet 11   Cholecalciferol (  VITAMIN D3) 50 MCG (2000 UT) TABS Take 2,000 Units by mouth daily.      gabapentin  (NEURONTIN ) 100 MG capsule Take 2 capsules (200 mg total) by mouth at bedtime. 60 capsule 2   letrozole  (FEMARA ) 2.5 MG tablet Take 1 tablet (2.5 mg total) by mouth daily. 90 tablet 3   lidocaine -prilocaine  (EMLA ) cream Apply 1 Application topically as needed. 30 g 1   losartan  (COZAAR ) 100 MG tablet TAKE 1 TABLET BY MOUTH EVERY DAY 90 tablet 3   metoprolol  succinate (TOPROL -XL) 100 MG 24 hr tablet TAKE 1 TABLET BY MOUTH EVERY DAY 90 tablet 3   omeprazole (PRILOSEC OTC) 20 MG tablet Take 20 mg by mouth daily.     potassium chloride  (KLOR-CON  M) 10 MEQ tablet TAKE 1 TABLET BY MOUTH EVERY DAY 90 tablet 1   No current facility-administered medications for this  visit.   Facility-Administered Medications Ordered in Other Visits  Medication Dose Route Frequency Provider Last Rate Last Admin   0.9 %  sodium chloride  infusion   Intravenous Once Lanny Callander, MD       sodium chloride  flush (NS) 0.9 % injection 10 mL  10 mL Intravenous PRN Lanny Callander, MD        PHYSICAL EXAMINATION: ECOG PERFORMANCE STATUS: 0 - Asymptomatic  Vitals:   01/03/24 0833  BP: 120/72  Pulse: 81  Resp: 15  Temp: (!) 97.4 F (36.3 C)  SpO2: 99%   Wt Readings from Last 3 Encounters:  01/03/24 208 lb 4.8 oz (94.5 kg)  11/22/23 212 lb 14.4 oz (96.6 kg)  11/02/23 210 lb 14.4 oz (95.7 kg)     GENERAL:alert, no distress and comfortable SKIN: skin color, texture, turgor are normal, no rashes or significant lesions EYES: normal, Conjunctiva are pink and non-injected, sclera clear NECK: supple, thyroid normal size, non-tender, without nodularity LYMPH:  no palpable lymphadenopathy in the cervical, axillary  LUNGS: clear to auscultation and percussion with normal breathing effort HEART: regular rate & rhythm and no murmurs and no lower extremity edema ABDOMEN:abdomen soft, non-tender and normal bowel sounds Musculoskeletal:no cyanosis of digits and no clubbing  NEURO: alert & oriented x 3 with fluent speech, no focal motor/sensory deficits  Physical Exam    LABORATORY DATA:  I have reviewed the data as listed    Latest Ref Rng & Units 01/03/2024    7:42 AM 11/22/2023    9:00 AM 09/21/2023   12:26 PM  CBC  WBC 4.0 - 10.5 K/uL 5.2  6.9  7.4   Hemoglobin 12.0 - 15.0 g/dL 87.1  86.6  87.2   Hematocrit 36.0 - 46.0 % 39.3  38.9  38.9   Platelets 150 - 400 K/uL 298  318  341         Latest Ref Rng & Units 01/03/2024    7:42 AM 11/22/2023    9:00 AM 09/21/2023   12:26 PM  CMP  Glucose 70 - 99 mg/dL 99  891  884   BUN 8 - 23 mg/dL 16  14  15    Creatinine 0.44 - 1.00 mg/dL 9.14  8.83  9.13   Sodium 135 - 145 mmol/L 141  143  142   Potassium 3.5 - 5.1 mmol/L 3.8  3.6   3.8   Chloride 98 - 111 mmol/L 110  109  111   CO2 22 - 32 mmol/L 26  29  28    Calcium 8.9 - 10.3 mg/dL 9.2  9.1  9.1   Total Protein  6.5 - 8.1 g/dL 7.1  7.3  7.2   Total Bilirubin 0.0 - 1.2 mg/dL 0.4  0.5  0.4   Alkaline Phos 38 - 126 U/L 79  89  98   AST 15 - 41 U/L 16  14  16    ALT 0 - 44 U/L 13  10  13        RADIOGRAPHIC STUDIES: I have personally reviewed the radiological images as listed and agreed with the findings in the report. No results found.    Orders Placed This Encounter  Procedures   CBC with Differential (Cancer Center Only)    Standing Status:   Future    Expected Date:   02/15/2024    Expiration Date:   02/14/2025   CMP (Cancer Center only)    Standing Status:   Future    Expected Date:   02/15/2024    Expiration Date:   02/14/2025   CA 27.29    Standing Status:   Future    Expected Date:   02/15/2024    Expiration Date:   02/14/2025   CBC with Differential (Cancer Center Only)    Standing Status:   Future    Expected Date:   03/07/2024    Expiration Date:   03/07/2025   CMP (Cancer Center only)    Standing Status:   Future    Expected Date:   03/07/2024    Expiration Date:   03/07/2025   CA 27.29    Standing Status:   Future    Expected Date:   03/07/2024    Expiration Date:   03/07/2025   CBC with Differential (Cancer Center Only)    Standing Status:   Future    Expected Date:   03/28/2024    Expiration Date:   03/28/2025   CMP (Cancer Center only)    Standing Status:   Future    Expected Date:   03/28/2024    Expiration Date:   03/28/2025   [place] 27.29    Standing Status:   Future    Expected Date:   03/28/2024    Expiration Date:   03/28/2025   All questions were answered. The patient knows to call the clinic with any problems, questions or concerns. No barriers to learning was detected. The total time spent in the appointment was 25 minutes, including review of chart and various tests results, discussions about plan of care and coordination of  care plan     Onita Mattock, MD 01/03/2024

## 2024-01-04 ENCOUNTER — Other Ambulatory Visit: Payer: Self-pay

## 2024-01-04 ENCOUNTER — Ambulatory Visit: Payer: Self-pay | Admitting: Nurse Practitioner

## 2024-01-04 LAB — CANCER ANTIGEN 27.29: CA 27.29: 38.5 U/mL (ref 0.0–38.6)

## 2024-01-14 ENCOUNTER — Telehealth (HOSPITAL_COMMUNITY): Payer: Self-pay | Admitting: Internal Medicine

## 2024-01-15 ENCOUNTER — Other Ambulatory Visit: Payer: Self-pay

## 2024-01-17 ENCOUNTER — Other Ambulatory Visit: Payer: Self-pay

## 2024-01-25 ENCOUNTER — Encounter: Payer: Self-pay | Admitting: Hematology

## 2024-01-25 ENCOUNTER — Inpatient Hospital Stay: Attending: Hematology

## 2024-01-25 ENCOUNTER — Ambulatory Visit

## 2024-01-25 VITALS — BP 128/77 | HR 79 | Temp 97.6°F | Resp 18

## 2024-01-25 DIAGNOSIS — C7951 Secondary malignant neoplasm of bone: Secondary | ICD-10-CM | POA: Insufficient documentation

## 2024-01-25 DIAGNOSIS — Z79811 Long term (current) use of aromatase inhibitors: Secondary | ICD-10-CM | POA: Diagnosis not present

## 2024-01-25 DIAGNOSIS — Z170 Estrogen receptor positive status [ER+]: Secondary | ICD-10-CM | POA: Insufficient documentation

## 2024-01-25 DIAGNOSIS — Z1721 Progesterone receptor positive status: Secondary | ICD-10-CM | POA: Diagnosis not present

## 2024-01-25 DIAGNOSIS — C50411 Malignant neoplasm of upper-outer quadrant of right female breast: Secondary | ICD-10-CM | POA: Diagnosis not present

## 2024-01-25 DIAGNOSIS — Z1731 Human epidermal growth factor receptor 2 positive status: Secondary | ICD-10-CM | POA: Insufficient documentation

## 2024-01-25 DIAGNOSIS — Z5111 Encounter for antineoplastic chemotherapy: Secondary | ICD-10-CM | POA: Insufficient documentation

## 2024-01-25 MED ORDER — DIPHENHYDRAMINE HCL 25 MG PO CAPS
50.0000 mg | ORAL_CAPSULE | Freq: Once | ORAL | Status: AC
  Administered 2024-01-25: 50 mg via ORAL
  Filled 2024-01-25: qty 2

## 2024-01-25 MED ORDER — ACETAMINOPHEN 325 MG PO TABS
650.0000 mg | ORAL_TABLET | Freq: Once | ORAL | Status: AC
  Administered 2024-01-25: 650 mg via ORAL
  Filled 2024-01-25: qty 2

## 2024-01-25 MED ORDER — PERTUZ-TRASTUZ-HYALURON-ZZXF 60-60-2000 MG-MG-U/ML CHEMO ~~LOC~~ SOLN
600.0000 mg | Freq: Once | SUBCUTANEOUS | Status: AC
  Administered 2024-01-25: 600 mg via SUBCUTANEOUS
  Filled 2024-01-25: qty 10

## 2024-01-25 MED ORDER — SODIUM CHLORIDE 0.9 % IV SOLN: Freq: Once | INTRAVENOUS | Status: DC

## 2024-01-25 NOTE — Patient Instructions (Signed)
 [facility] - A DEPT OF [facility]  Discharge Instructions: Thank you for choosing [facility] to provide your oncology and hematology care.   If you have a lab appointment with the Cancer Center, please go directly to the Cancer Center and check in at the registration area.   Wear comfortable clothing and clothing appropriate for easy access to any Portacath or PICC line.   We strive to give you quality time with your provider. You may need to reschedule your appointment if you arrive late (15 or more minutes).  Arriving late affects you and other patients whose appointments are after yours.  Also, if you miss three or more appointments without notifying the office, you may be dismissed from the clinic at the provider's discretion.      For prescription refill requests, have your pharmacy contact our office and allow 72 hours for refills to be completed.    Today you received the following chemotherapy and/or immunotherapy agents: phesgo       To help prevent nausea and vomiting after your treatment, we encourage you to take your nausea medication as directed.  BELOW ARE SYMPTOMS THAT SHOULD BE REPORTED IMMEDIATELY: *FEVER GREATER THAN 100.4 F (38 C) OR HIGHER *CHILLS OR SWEATING *NAUSEA AND VOMITING THAT IS NOT CONTROLLED WITH YOUR NAUSEA MEDICATION *UNUSUAL SHORTNESS OF BREATH *UNUSUAL BRUISING OR BLEEDING *URINARY PROBLEMS (pain or burning when urinating, or frequent urination) *BOWEL PROBLEMS (unusual diarrhea, constipation, pain near the anus) TENDERNESS IN MOUTH AND THROAT WITH OR WITHOUT PRESENCE OF ULCERS (sore throat, sores in mouth, or a toothache) UNUSUAL RASH, SWELLING OR PAIN  UNUSUAL VAGINAL DISCHARGE OR ITCHING   Items with * indicate a potential emergency and should be followed up as soon as possible or go to the Emergency Department if any problems should occur.  Please show the CHEMOTHERAPY ALERT CARD or IMMUNOTHERAPY  ALERT CARD at check-in to the Emergency Department and triage nurse.  Should you have questions after your visit or need to cancel or reschedule your appointment, please contact [facility] - A DEPT OF [facility]  Dept: [phone]  and follow the prompts.  Office hours are 8:00 a.m. to 4:30 p.m. Monday - Friday. Please note that voicemails left after 4:00 p.m. may not be returned until the following business day.  We are closed weekends and major holidays. You have access to a nurse at all times for urgent questions. Please call the main number to the clinic Dept: [phone] and follow the prompts.   For any non-urgent questions, you may also contact your provider using MyChart. We now offer e-Visits for anyone 60 and older to request care online for non-urgent symptoms. For details visit [email or web address].   Also download the MyChart app! Go to the app store, search MyChart, open the app, select [place], and log in with your MyChart username and password.

## 2024-02-15 ENCOUNTER — Inpatient Hospital Stay

## 2024-02-15 ENCOUNTER — Encounter: Payer: Self-pay | Admitting: Hematology

## 2024-02-15 VITALS — BP 122/80 | HR 78 | Temp 97.3°F | Resp 18

## 2024-02-15 DIAGNOSIS — Z5111 Encounter for antineoplastic chemotherapy: Secondary | ICD-10-CM | POA: Diagnosis not present

## 2024-02-15 DIAGNOSIS — C50411 Malignant neoplasm of upper-outer quadrant of right female breast: Secondary | ICD-10-CM

## 2024-02-15 MED ORDER — ACETAMINOPHEN 325 MG PO TABS
650.0000 mg | ORAL_TABLET | Freq: Once | ORAL | Status: AC
  Administered 2024-02-15: 650 mg via ORAL
  Filled 2024-02-15: qty 2

## 2024-02-15 MED ORDER — SODIUM CHLORIDE 0.9 % IV SOLN: Freq: Once | INTRAVENOUS | Status: DC

## 2024-02-15 MED ORDER — DIPHENHYDRAMINE HCL 25 MG PO CAPS
50.0000 mg | ORAL_CAPSULE | Freq: Once | ORAL | Status: AC
  Administered 2024-02-15: 50 mg via ORAL
  Filled 2024-02-15: qty 2

## 2024-02-15 MED ORDER — PERTUZ-TRASTUZ-HYALURON-ZZXF 60-60-2000 MG-MG-U/ML CHEMO ~~LOC~~ SOLN
600.0000 mg | Freq: Once | SUBCUTANEOUS | Status: AC
  Administered 2024-02-15: 600 mg via SUBCUTANEOUS
  Filled 2024-02-15: qty 10

## 2024-02-15 NOTE — Patient Instructions (Signed)
 [facility] - A DEPT OF [facility]  Discharge Instructions: Thank you for choosing [facility] to provide your oncology and hematology care.   If you have a lab appointment with the Cancer Center, please go directly to the Cancer Center and check in at the registration area.   Wear comfortable clothing and clothing appropriate for easy access to any Portacath or PICC line.   We strive to give you quality time with your provider. You may need to reschedule your appointment if you arrive late (15 or more minutes).  Arriving late affects you and other patients whose appointments are after yours.  Also, if you miss three or more appointments without notifying the office, you may be dismissed from the clinic at the provider's discretion.      For prescription refill requests, have your pharmacy contact our office and allow 72 hours for refills to be completed.    Today you received the following chemotherapy and/or immunotherapy agents: phesgo       To help prevent nausea and vomiting after your treatment, we encourage you to take your nausea medication as directed.  BELOW ARE SYMPTOMS THAT SHOULD BE REPORTED IMMEDIATELY: *FEVER GREATER THAN 100.4 F (38 C) OR HIGHER *CHILLS OR SWEATING *NAUSEA AND VOMITING THAT IS NOT CONTROLLED WITH YOUR NAUSEA MEDICATION *UNUSUAL SHORTNESS OF BREATH *UNUSUAL BRUISING OR BLEEDING *URINARY PROBLEMS (pain or burning when urinating, or frequent urination) *BOWEL PROBLEMS (unusual diarrhea, constipation, pain near the anus) TENDERNESS IN MOUTH AND THROAT WITH OR WITHOUT PRESENCE OF ULCERS (sore throat, sores in mouth, or a toothache) UNUSUAL RASH, SWELLING OR PAIN  UNUSUAL VAGINAL DISCHARGE OR ITCHING   Items with * indicate a potential emergency and should be followed up as soon as possible or go to the Emergency Department if any problems should occur.  Please show the CHEMOTHERAPY ALERT CARD or IMMUNOTHERAPY  ALERT CARD at check-in to the Emergency Department and triage nurse.  Should you have questions after your visit or need to cancel or reschedule your appointment, please contact [facility] - A DEPT OF [facility]  Dept: [phone]  and follow the prompts.  Office hours are 8:00 a.m. to 4:30 p.m. Monday - Friday. Please note that voicemails left after 4:00 p.m. may not be returned until the following business day.  We are closed weekends and major holidays. You have access to a nurse at all times for urgent questions. Please call the main number to the clinic Dept: [phone] and follow the prompts.   For any non-urgent questions, you may also contact your provider using MyChart. We now offer e-Visits for anyone 60 and older to request care online for non-urgent symptoms. For details visit [email or web address].   Also download the MyChart app! Go to the app store, search MyChart, open the app, select [place], and log in with your MyChart username and password.

## 2024-02-24 ENCOUNTER — Other Ambulatory Visit: Payer: Self-pay | Admitting: Hematology

## 2024-02-25 ENCOUNTER — Encounter: Payer: Self-pay | Admitting: Hematology

## 2024-03-05 NOTE — Assessment & Plan Note (Signed)
-  echo 09/22/20 showed decreased EF. She was started on losartan  by Dr. Bensimhon on 10/21/20 and EF improved. -repeated echo 09/2023 showed EF 55-60%, stable

## 2024-03-05 NOTE — Assessment & Plan Note (Signed)
 stage IV [phone]), with bone and possible liver mets,  ER+/PR+/HER2+, Grade II-III   --diagnosed in 11/2019 with large right breast mass measuring up to 11cm and right lymphadenopathy. 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 Left axillary node biopsy was benign.  -01/01/20 Liver biopsy was negative but 01/12/20 Bone Biopsy was positive for metastatic carcinoma from primary breast cancer. ER/PR/HER2 were all negative, possibly related to decalcification. -She received THP 01/03/20 - 2/3/2 -on maintenance HP Phesgo  injection every 3 weeks -last staging scan in 08/2020, not repeated due to lack of insurance and she is clinically doing well  -repeated CT and bone scan 05/05/2022 showed good partial response in liver, minimum uptake in bone mets, no concerns for new lesion or evidence of progression.  I personally reviewed his scan images and discussed with patient. -Will continue Phesgo  injection every 3 weeks and letrozole , she is tolerating well  -CT 07/02/2023 and 01/03/2024 was negative for residual disease

## 2024-03-06 ENCOUNTER — Encounter: Payer: Self-pay | Admitting: Hematology

## 2024-03-06 ENCOUNTER — Inpatient Hospital Stay

## 2024-03-06 ENCOUNTER — Inpatient Hospital Stay (HOSPITAL_BASED_OUTPATIENT_CLINIC_OR_DEPARTMENT_OTHER): Admitting: Hematology

## 2024-03-06 ENCOUNTER — Inpatient Hospital Stay: Attending: Hematology

## 2024-03-06 VITALS — BP 118/70 | HR 80 | Temp 98.2°F | Resp 17 | Wt 205.0 lb

## 2024-03-06 DIAGNOSIS — C50411 Malignant neoplasm of upper-outer quadrant of right female breast: Secondary | ICD-10-CM | POA: Insufficient documentation

## 2024-03-06 DIAGNOSIS — Z170 Estrogen receptor positive status [ER+]: Secondary | ICD-10-CM

## 2024-03-06 DIAGNOSIS — C7951 Secondary malignant neoplasm of bone: Secondary | ICD-10-CM

## 2024-03-06 DIAGNOSIS — M25551 Pain in right hip: Secondary | ICD-10-CM | POA: Diagnosis not present

## 2024-03-06 DIAGNOSIS — Z1731 Human epidermal growth factor receptor 2 positive status: Secondary | ICD-10-CM | POA: Insufficient documentation

## 2024-03-06 DIAGNOSIS — Z79811 Long term (current) use of aromatase inhibitors: Secondary | ICD-10-CM | POA: Insufficient documentation

## 2024-03-06 DIAGNOSIS — Z1721 Progesterone receptor positive status: Secondary | ICD-10-CM | POA: Insufficient documentation

## 2024-03-06 DIAGNOSIS — I427 Cardiomyopathy due to drug and external agent: Secondary | ICD-10-CM | POA: Insufficient documentation

## 2024-03-06 DIAGNOSIS — T451X5A Adverse effect of antineoplastic and immunosuppressive drugs, initial encounter: Secondary | ICD-10-CM

## 2024-03-06 DIAGNOSIS — Z5111 Encounter for antineoplastic chemotherapy: Secondary | ICD-10-CM | POA: Insufficient documentation

## 2024-03-06 LAB — CMP (CANCER CENTER ONLY)
ALT: 14 U/L (ref 0–44)
AST: 15 U/L (ref 15–41)
Albumin: 4.1 g/dL (ref 3.5–5.0)
Alkaline Phosphatase: 91 U/L (ref 38–126)
Anion gap: 5 (ref 5–15)
BUN: 17 mg/dL (ref 8–23)
CO2: 27 mmol/L (ref 22–32)
Calcium: 9.2 mg/dL (ref 8.9–10.3)
Chloride: 110 mmol/L (ref 98–111)
Creatinine: 0.8 mg/dL (ref 0.44–1.00)
GFR, Estimated: >60 mL/min (ref >60)
Glucose, Bld: 97 mg/dL (ref 70–99)
Potassium: 3.7 mmol/L (ref 3.5–5.1)
Sodium: 142 mmol/L (ref 135–145)
Total Bilirubin: 0.4 mg/dL (ref 0.0–1.2)
Total Protein: 7 g/dL (ref 6.5–8.1)

## 2024-03-06 LAB — CBC WITH DIFFERENTIAL (CANCER CENTER ONLY)
Abs Immature Granulocytes: 0.01 K/uL (ref 0.00–0.07)
Basophils Absolute: 0 K/uL (ref 0.0–0.1)
Basophils Relative: 0 %
Eosinophils Absolute: 0.2 K/uL (ref 0.0–0.5)
Eosinophils Relative: 3 %
HCT: 36.7 % (ref 36.0–46.0)
Hemoglobin: 12.2 g/dL (ref 12.0–15.0)
Immature Granulocytes: 0 %
Lymphocytes Relative: 41 %
Lymphs Abs: 2.5 K/uL (ref 0.7–4.0)
MCH: 29.3 pg (ref 26.0–34.0)
MCHC: 33.2 g/dL (ref 30.0–36.0)
MCV: 88 fL (ref 80.0–100.0)
Monocytes Absolute: 0.6 K/uL (ref 0.1–1.0)
Monocytes Relative: 10 %
Neutro Abs: 2.8 K/uL (ref 1.7–7.7)
Neutrophils Relative %: 46 %
Platelet Count: 283 K/uL (ref 150–400)
RBC: 4.17 MIL/uL (ref 3.87–5.11)
RDW: 13.9 % (ref 11.5–15.5)
WBC Count: 6.1 K/uL (ref 4.0–10.5)
nRBC: 0 % (ref 0.0–0.2)

## 2024-03-06 MED ORDER — ACETAMINOPHEN 325 MG PO TABS
650.0000 mg | ORAL_TABLET | Freq: Once | ORAL | Status: AC
  Administered 2024-03-06: 650 mg via ORAL
  Filled 2024-03-06: qty 2

## 2024-03-06 MED ORDER — PERTUZ-TRASTUZ-HYALURON-ZZXF 60-60-2000 MG-MG-U/ML CHEMO ~~LOC~~ SOLN
600.0000 mg | Freq: Once | SUBCUTANEOUS | Status: AC
  Administered 2024-03-06: 600 mg via SUBCUTANEOUS
  Filled 2024-03-06: qty 10

## 2024-03-06 MED ORDER — DIPHENHYDRAMINE HCL 25 MG PO CAPS
50.0000 mg | ORAL_CAPSULE | Freq: Once | ORAL | Status: AC
  Administered 2024-03-06: 50 mg via ORAL
  Filled 2024-03-06: qty 2

## 2024-03-06 NOTE — Patient Instructions (Signed)
 [facility] - A DEPT OF [facility]  Discharge Instructions: Thank you for choosing [facility] to provide your oncology and hematology care.   If you have a lab appointment with the Cancer Center, please go directly to the Cancer Center and check in at the registration area.   Wear comfortable clothing and clothing appropriate for easy access to any Portacath or PICC line.   We strive to give you quality time with your provider. You may need to reschedule your appointment if you arrive late (15 or more minutes).  Arriving late affects you and other patients whose appointments are after yours.  Also, if you miss three or more appointments without notifying the office, you may be dismissed from the clinic at the provider's discretion.      For prescription refill requests, have your pharmacy contact our office and allow 72 hours for refills to be completed.    Today you received the following chemotherapy and/or immunotherapy agents phesgo      To help prevent nausea and vomiting after your treatment, we encourage you to take your nausea medication as directed.  BELOW ARE SYMPTOMS THAT SHOULD BE REPORTED IMMEDIATELY: *FEVER GREATER THAN 100.4 F (38 C) OR HIGHER *CHILLS OR SWEATING *NAUSEA AND VOMITING THAT IS NOT CONTROLLED WITH YOUR NAUSEA MEDICATION *UNUSUAL SHORTNESS OF BREATH *UNUSUAL BRUISING OR BLEEDING *URINARY PROBLEMS (pain or burning when urinating, or frequent urination) *BOWEL PROBLEMS (unusual diarrhea, constipation, pain near the anus) TENDERNESS IN MOUTH AND THROAT WITH OR WITHOUT PRESENCE OF ULCERS (sore throat, sores in mouth, or a toothache) UNUSUAL RASH, SWELLING OR PAIN  UNUSUAL VAGINAL DISCHARGE OR ITCHING   Items with * indicate a potential emergency and should be followed up as soon as possible or go to the Emergency Department if any problems should occur.  Please show the CHEMOTHERAPY ALERT CARD or IMMUNOTHERAPY  ALERT CARD at check-in to the Emergency Department and triage nurse.  Should you have questions after your visit or need to cancel or reschedule your appointment, please contact [facility] - A DEPT OF [facility]  Dept: [phone]  and follow the prompts.  Office hours are 8:00 a.m. to 4:30 p.m. Monday - Friday. Please note that voicemails left after 4:00 p.m. may not be returned until the following business day.  We are closed weekends and major holidays. You have access to a nurse at all times for urgent questions. Please call the main number to the clinic Dept: [phone] and follow the prompts.   For any non-urgent questions, you may also contact your provider using MyChart. We now offer e-Visits for anyone 38 and older to request care online for non-urgent symptoms. For details visit [email or web address].   Also download the MyChart app! Go to the app store, search "MyChart", open the app, select [place], and log in with your MyChart username and password.

## 2024-03-06 NOTE — Progress Notes (Signed)
 [facility]   Telephone:([phone]) [phone] Fax:(336) [phone]   Clinic Follow up Note   Patient Care Team: Asuncion Setter, PA-C as PCP - General (Physician Assistant) Aron Shoulders, MD as Consulting Physician (General Surgery) Lanny Callander, MD as Consulting Physician (Hematology) Dewey Rush, MD as Consulting Physician (Radiation Oncology) Tyree Nanetta SAILOR, RN as Oncology Nurse Navigator  Date of Service:  03/06/2024  CHIEF COMPLAINT: f/u of metastatic breast cancer  CURRENT THERAPY:  Phesgo  injection every 3 weeks Letrozole  2.5 mg daily  Oncology History   Chemotherapy induced cardiomyopathy -echo 09/22/20 showed decreased EF. She was started on losartan  by Dr. Cherrie on 10/21/20 and EF improved. -repeated echo 09/2023 showed EF 55-60%, stable     Malignant neoplasm of upper-outer quadrant of right breast in female, estrogen receptor positive (HCC) stage IV r([id number]), with bone and possible liver mets,  ER+/PR+/HER2+, Grade II-III   --diagnosed in 11/2019 with large right breast mass measuring up to 11cm and right lymphadenopathy. 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 Left axillary node biopsy was benign.  -01/01/20 Liver biopsy was negative but 01/12/20 Bone Biopsy was positive for metastatic carcinoma from primary breast cancer. ER/PR/HER2 were all negative, possibly related to decalcification. -She received THP 01/03/20 - 2/3/2 -on maintenance HP Phesgo  injection every 3 weeks -last staging scan in 08/2020, not repeated due to lack of insurance and she is clinically doing well  -repeated CT and bone scan 05/05/2022 showed good partial response in liver, minimum uptake in bone mets, no concerns for new lesion or evidence of progression.  I personally reviewed his scan images and discussed with patient. -Will continue Phesgo  injection every 3 weeks and letrozole , she is tolerating well  -CT 07/02/2023 and 01/03/2024  was negative for residual disease   Assessment & Plan Metastatic breast cancer, right upper-outer quadrant Metastatic breast cancer in the right upper-outer quadrant is well-managed with no new symptoms or significant changes. Recent mammogram in August 2025 showed stable findings with no new or concerning changes. She is on a long-term treatment regimen with regular follow-ups and monitoring by a cardiologist due to cardiac considerations. - Scheduled next injection at the [facility] lab on December 5th, 2025. - Scheduled follow-up injection at the clinic on January 16th, 2026. - Continue regular follow-ups with cardiologist every six months. - Ensure insurance coverage is maintained.  Right hip pain Intermittent right hip pain, possibly related to metastatic cancer or arthritis. Pain is not severe and occurs occasionally, especially with certain movements. Recent scan in September showed no active disease in the hip area, suggesting arthritis or positional factors as potential causes. - Recommended exercise to alleviate pain. - Advised use of acetaminophen  or ibuprofen as needed for pain management.  Plan - She is clinically doing very well, will continue Phesgo  injection every 3 weeks -Continue letrozole  - Follow-up in 9 weeks, will order restaging scan on next visit    SUMMARY OF ONCOLOGIC HISTORY: Oncology History Overview Note  Cancer Staging Malignant neoplasm of upper-outer quadrant of right breast in female, estrogen receptor positive (HCC) Staging form: Breast, AJCC 8th Edition - Clinical stage from 11/25/2019: Stage IIA (cT3, cN1, cM0, G2, ER+, PR+, HER2+) - Signed by Lanny Callander, MD on 12/03/2019    Malignant neoplasm of upper-outer quadrant of right breast in female, estrogen receptor positive (HCC)  11/13/2019 Mammogram    Diagnostic Mammogram 11/13/19  IMPRESSION: -Highly suspicious mass and calcifications in the right breast centered between 9  and 12 o'clock spanning up to  11 cm.  -Multiple masses in the right axilla. One of the masses contains calcifications, denoted as mass number 2 measuring 9 x 11 by 10 mm. Several of these masses do not appear to represent definitive lymph nodes. Others may be small but abnormal appearing lymph nodes. Taken in total, a cluster of masses in the right axilla spans up to 3.7 cm.  -There are fibrocystic changes on the left. There are also some solid-appearing masses. A solid appearing mass at 10:30, 6 cm from the nipple measures 9 x 6 x 6 mm. An adjacent solid mass at 10 o'clock, 6 cm from the nipple measures 8 x 6 by 5 mm. Another solid-appearing mass at 11 o'clock, 1 cm from the nipple measures 5 x 4 by 5 mm. Fibrocystic changes are seen at 10 o'clock, 12 o'clock, and 4 o'clock. Another solid-appearing mass seen at 5 o'clock, 4 cm from the nipple measuring 6 x 3 x 4 mm. There is a single abnormal node in the left axilla with a cortex measuring 5 mm and restriction of the fatty hilum.   11/25/2019 Initial Biopsy   Diagnosis 1. Breast, right, needle core biopsy, upper outer - INVASIVE MAMMARY CARCINOMA - MAMMARY CARCINOMA IN SITU WITH CALCIFICATIONS AND NECROSIS - SEE COMMENT 2. Lymph node, needle/core biopsy, right axilla - INVASIVE MAMMARY CARCINOMA - SEE COMMENT 3. Lymph node, needle/core biopsy, left axilla - BENIGN LYMPH NODE - NO CARCINOMA IDENTIFIED Microscopic Comment 1. The biopsy material shows an infiltrative proliferation of cells arranged linearly and in small clusters. Based on the biopsy, the carcinoma appears Nottingham grade 2 of 3 and measures 1.2 cm in greatest linear extent. E-cadherin and prognostic markers (ER/PR/ki-67/HER2)are pending and will be reported in an addendum. Dr. Alvaro reviewed the case and agrees with the above diagnosis. These results were called to [facility] on November 26, 2019. 2. Definitive nodal tissue is not identified. This biopsy has a slightly different  architecture morphology than what is seen in part 1. The carcinoma measures 0.7 cm in greatest dimension. E-cadherin and prognostic markers (ER, PR, Ki-67 and HER-2) are pending and will be reported in an addendum.   11/25/2019 Receptors her2   1. PROGNOSTIC INDICATORS Results: IMMUNOHISTOCHEMICAL AND MORPHOMETRIC ANALYSIS PERFORMED MANUALLY The tumor cells are POSITIVE for Her2 (3+). Estrogen Receptor: 90%, POSITIVE, STRONG STAINING INTENSITY Progesterone Receptor: 20%, POSITIVE, STRONG STAINING INTENSITY Proliferation Marker Ki67: 30%  2. PROGNOSTIC INDICATORS Results: IMMUNOHISTOCHEMICAL AND MORPHOMETRIC ANALYSIS PERFORMED MANUALLY The tumor cells are POSITIVE for Her2 (3+). Estrogen Receptor: 90%, POSITIVE, STRONG STAINING INTENSITY Progesterone Receptor: 25%, POSITIVE, STRONG STAINING INTENSITY Proliferation Marker Ki67: 30%   11/25/2019 Cancer Staging   Staging form: Breast, AJCC 8th Edition - Clinical stage from 11/25/2019: Stage IIA (cT3, cN1, cM0, G2, ER+, PR+, HER2+) - Signed by Lanny Callander, MD on 12/03/2019   12/01/2019 Initial Diagnosis   Malignant neoplasm of upper-outer quadrant of right breast in female, estrogen receptor positive (HCC)   12/10/2019 Imaging   Bone Scan  IMPRESSION: Findings concerning for bony metastatic disease in the left acetabulum region, L3 vertebral body region, and anterolateral right tenth rib.   Increased uptake in several large joints likely is of arthropathic etiology.   12/11/2019 Imaging   CT CAP w contrast  IMPRESSION: Prominent glandular tissue in the central right breast with nipple retraction, likely corresponding to the patient's known right breast neoplasm.   Prominent right axillary nodes measuring up to 8  mm short axis, suspicious for nodal metastases in this clinical context.   3 mm subpleural pulmonary nodule in the right lower lobe, likely benign. However, follow-up CT chest is suggested in 3-6 months.   Multiple hepatic  lesions, including a dominant 2.4 cm lesion in segment 6, which is considered indeterminate. Metastasis is not excluded. Consider MRI abdomen with/without contrast for further characterization.   11 mm sclerotic lesion along the left posterior aspect of the T7 vertebral body raises concern for isolated osseous metastasis. Correlate with priors if available. Benign vertebral hemangioma at T11.   12/12/2019 Breast MRI   IMPRESSION: 1. The biopsy proven malignancy in the right breast involves all 4 quadrants and spans up to 9 cm by MRI. There is enhancement within the skin at the level of the nipple.   2. Multiple matted masses are seen in the right axilla, either abnormal metastatic lymph nodes or a combination of masses and metastatic lymph nodes. One of these has been previously biopsied showing invasive mammary carcinoma without definitive nodal tissue.   3.  There is a 1.5 cm Rotter's lymph node on the right.   4. There is markedly abnormal enhancement and edema in the right pectoralis major muscle spanning 6-7 cm, suspicious for metastatic disease.   5. There is diffuse nodular enhancement throughout the left breast, which may correspond with the solid-appearing masses seen on ultrasound. These all have a similar appearance on MRI.   6.  No findings of left axillary lymphadenopathy.   12/15/2019 Procedure   PAC placed    12/24/2019 Imaging   MRI abdomen  IMPRESSION: 1. Multifocal enhancing bone lesions are noted within the lumbar spine, and bony pelvis compatible with metastatic disease. 2. Four, small peripherally enhancing cystic lesions are noted within the liver worrisome for metastatic disease. 3. 2 enhancing liver lesions are also noted with signal and enhancement characteristics consistent with benign liver hemangioma. 4. Well-circumscribed, enhancing T2 and T1 hypointense structure within the right adnexal region is indeterminate. It is unclear whether not this  is arising from the right ovary, broad ligament or right side of uterine fundus. Differential considerations include fibrothecoma, versus pedunculated uterine leiomyoma. Metastatic disease is considered less favored. Attention on follow-up imaging is recommended.     01/03/2020 - 05/27/2020 Chemotherapy   First line THP q3weeks starting 01/03/20-05/27/20   01/05/2020 Imaging   US  Abdomen  IMPRESSION: 1. Suspect hematoma in the medial right lobe of the liver in the area of previous biopsy. Note that recent MR does not show lesion in this area. This area has ill-defined margins and measures 3.9 x 4.0 x 3.5 cm. No perihepatic fluid.   2. Probable hemangiomas elsewhere in the right lobe of the liver. Note that several smaller lesions seen on recent MR are not appreciable by ultrasound.   3.  Study otherwise unremarkable.   01/12/2020 Pathology Results   FINAL MICROSCOPIC DIAGNOSIS:   A. BONE, SCLEROTIC LESION, LEFT ANTERIOR ILIAC SPINE, BIOPSY:  - Metastatic carcinoma, consistent with patient's clinical history of  primary breast carcinoma  - See comment   COMMENT:  Immunohistochemical stains show that the tumor cells are positive for  CK7 and GATA3; and negative for CK20, consistent with above  interpretation.   PROGNOSTIC INDICATOR RESULTS:  The tumor cells are NEGATIVE for Her2 (0); see comment  Estrogen Receptor: NEGATIVE; see comment  Progesterone Receptor: NEGATIVE; see comment    03/23/2020 Imaging   CT CAP IMPRESSION: 1. Right lower lobe perifissural nodule is slightly  more conspicuous on today's study. Close attention on follow-up recommended. 2. Interval progression of segment IV liver lesion, concerning for metastatic progression. The remaining visualized liver lesions are stable to smaller in the interval. 3. Interval evolution in appearance of thoracolumbar spinal lesions and left iliac crest lesion, potentially reflecting response to therapy/healing. No definite  new osseous lesions on today's study. 4. New small fluid collection right cul-de-sac. 5. Probable fibroid change in the uterus including exophytic right fundal lesion. 6. Aortic Atherosclerosis (ICD10-I70.0).   03/31/2020 Imaging   MR ABD IMPRESSION: 1. Enlarging hepatic lesion in hepatic subsegment IV suspicious for hepatic metastasis. The it is uncertain whether the change in the short interval is due to technical factors with different modalities or true enlargement. 2. Lesion in the posterior RIGHT hepatic lobe with imaging features that are atypical for hemangioma but stability and signs of enhancement on later phase raising this question. Another more classic appearing may angioma seen inferior to this location in hepatic subsegment VI. Attention on follow-up. 3. Heterogeneous enhancement throughout the spine particularly at L3 as exhibited on the prior study with very patchy marrow signal remains suspicious for bony metastatic lesions in this patient with known bone metastases.   04/01/2020 Imaging   Bone scan IMPRESSION: Good response to therapy with resolution of sites of uptake seen previously at the cervical spine and posterior RIGHT tenth rib as well as a diminished degree of uptake at remaining previously identified osseous metastatic foci.   04/28/2020 PET scan   IMPRESSION: 1. No hypermetabolic lesions to suggest active metastatic disease involving the liver or osseous structures. 2. Stable small low-attenuation liver lesions, likely treated disease.   06/18/2020 -  Chemotherapy   Given her excellent response, I switched to maintenance therapy with Herceptin , Perjeta  (Phesgo ) q3weeks and Letrozole .   06/18/2020 -  Chemotherapy   Zometa  starting 06/18/20   06/18/2020 -  Anti-estrogen oral therapy   Letrozole  2.5mg  once dialy.    06/18/2020 - 06/16/2022 Chemotherapy   Patient is on Treatment Plan : BREAST Trastuzumab  + Pertuzumab  q21d     09/03/2020 Imaging   Bone  Scan IMPRESSION: 1. Significant interval improvement compared to previous studies. Only mild uptake remains in the pelvis as above. There has been significant interval improvement over time.  CT C/A/P IMPRESSION: 1. Hypodense liver lesions are slightly decreased in size compared to prior examination, consistent with treatment response of hepatic metastatic disease. 2. Unchanged sclerotic lesion of the anterior left iliac crest. Unchanged lytic superior endplate deformity of L3, which remains equivocal for metastatic lesion versus mechanical Schmorl deformity. No CT evidence of new osseous metastatic disease. 3. No evidence of new metastatic disease in the chest, abdomen, or pelvis. 4. Coronary artery disease.   05/05/2022 Imaging    IMPRESSION: 1. Interval decreased size of the largest hepatic metastatic lesion with stable size of the additional subcentimeter bilobar hepatic lesions. Continued decrease in size of the bilobar hepatic metastases. 2. Similar appearance of the osseous metatatic disease. No new aggressive lytic or blastic lesion of bone. 3. No evidence of new or progressive metastatic disease in the chest, abdomen or pelvis.     05/05/2022 Imaging    IMPRESSION: Faint residual radiotracer uptake in the left hemipelvis.   New focus of mild abnormal radiotracer uptake in the right lateral seventh rib without discrete CT correlate on same day examination, nonspecific suggest correlation with history of interval trauma.     07/07/2022 -  Chemotherapy   Patient is on Treatment Plan :  BREAST Trastuzumab  + Pertuzumab  q21d     02/28/2023 Echocardiogram   IMPRESSIONS   1. Left ventricular ejection fraction, by estimation, is 55 to 60%. The left ventricle has normal function. The left ventricle has no regional wall motion abnormalities. Left ventricular diastolic parameters are consistent with Grade I diastolic  dysfunction (impaired relaxation). The average left  ventricular global longitudinal strain is -17.1 %. The global longitudinal strain is normal.   2. Right ventricular systolic function is normal. The right ventricular size is normal. There is normal pulmonary artery systolic pressure. The estimated right ventricular systolic pressure is 28.8 mmHg.   3. The mitral valve is normal in structure. No evidence of mitral valve regurgitation. No evidence of mitral stenosis.   4. The aortic valve is tricuspid. Aortic valve regurgitation is trivial. No aortic stenosis is present.   5. The inferior vena cava is normal in size with greater than 50% respiratory variability, suggesting right atrial pressure of 3 mmHg.  Comparison(s): No significant change from prior study. Prior GLS: -18.0.      07/02/2023 Imaging   CT CAP with contrast  IMPRESSION: CHEST:  No evidence of breast cancer metastasis in the chest.  PELVIS:  1. No evidence recurrent breast cancer within liver. Stable small hypodensities. 2. Previous described metastatic skeletal lesions are not well appreciated. 3. No new or progressive disease.      Discussed the use of AI scribe software for clinical note transcription with the patient, who gave verbal consent to proceed.  History of Present Illness Carolyn Stewart is a 63 year old female with metastatic breast cancer who presents for follow-up.  She has no new symptoms and is doing well overall. Occasional mild pain occurs in the right hip, particularly when lifting her leg or rubbing her feet. Her last scan in September showed no active disease. She continues on gabapentin , potassium, letrozole , and blood pressure medication. Her port is flushed regularly, and [facility]. A mammogram in August 2025 was stable with no new or concerning findings. No swelling or other new symptoms are present.     All other systems were reviewed with the patient and are negative.  MEDICAL HISTORY:  Past Medical  History:  Diagnosis Date   Cancer (HCC) 2021   Hypertension     SURGICAL HISTORY: Past Surgical History:  Procedure Laterality Date   left parotid tumor removal   2006   PORTACATH PLACEMENT Left 12/15/2019   Procedure: INSERTION PORT-A-CATH WITH ULTRASOUND GUIDANCE;  Surgeon: Aron Shoulders, MD;  Location: [facility] OR;  Service: General;  Laterality: Left;   TONSILLECTOMY      I have reviewed the social history and family history with the patient and they are unchanged from previous note.  ALLERGIES:  has no known allergies.  MEDICATIONS:  Current Outpatient Medications  Medication Sig Dispense Refill   acetaminophen  (TYLENOL ) 500 MG tablet Take 1,000 mg by mouth every 6 (six) hours as needed for moderate pain or headache.     amLODipine  (NORVASC ) 10 MG tablet Take 2 tablets (20 mg total) by mouth daily. 60 tablet 11   Cholecalciferol (VITAMIN D3) 50 MCG (2000 UT) TABS Take 2,000 Units by mouth daily.      gabapentin  (NEURONTIN ) 100 MG capsule TAKE 2 CAPSULES BY MOUTH AT BEDTIME. 60 capsule 2   letrozole  (FEMARA ) 2.5 MG tablet Take 1 tablet (2.5 mg total) by mouth daily. 90 tablet 3   lidocaine -prilocaine  (EMLA ) cream Apply 1 Application topically as  needed. 30 g 1   losartan  (COZAAR ) 100 MG tablet TAKE 1 TABLET BY MOUTH EVERY DAY 90 tablet 3   metoprolol  succinate (TOPROL -XL) 100 MG 24 hr tablet TAKE 1 TABLET BY MOUTH EVERY DAY 90 tablet 3   omeprazole (PRILOSEC OTC) 20 MG tablet Take 20 mg by mouth daily.     potassium chloride  (KLOR-CON  M) 10 MEQ tablet TAKE 1 TABLET BY MOUTH EVERY DAY 90 tablet 1   No current facility-administered medications for this visit.   Facility-Administered Medications Ordered in Other Visits  Medication Dose Route Frequency Provider Last Rate Last Admin   sodium chloride  flush (NS) 0.9 % injection 10 mL  10 mL Intravenous PRN Lanny Callander, MD        PHYSICAL EXAMINATION: ECOG PERFORMANCE STATUS: 0 - Asymptomatic  Vitals:   03/06/24 0801  BP: 118/70   Pulse: 80  Resp: 17  Temp: 98.2 F (36.8 C)  SpO2: 96%   Wt Readings from Last 3 Encounters:  03/06/24 205 lb (93 kg)  01/03/24 208 lb 4.8 oz (94.5 kg)  11/22/23 212 lb 14.4 oz (96.6 kg)     GENERAL:alert, no distress and comfortable SKIN: skin color, texture, turgor are normal, no rashes or significant lesions EYES: normal, Conjunctiva are pink and non-injected, sclera clear NECK: supple, thyroid normal size, non-tender, without nodularity LYMPH:  no palpable lymphadenopathy in the cervical, axillary  LUNGS: clear to auscultation and percussion with normal breathing effort HEART: regular rate & rhythm and no murmurs and no lower extremity edema ABDOMEN:abdomen soft, non-tender and normal bowel sounds Musculoskeletal:no cyanosis of digits and no clubbing  NEURO: alert & oriented x 3 with fluent speech, no focal motor/sensory deficits  Physical Exam    LABORATORY DATA:  I have reviewed the data as listed    Latest Ref Rng & Units 03/06/2024    7:36 AM 01/03/2024    7:42 AM 11/22/2023    9:00 AM  CBC  WBC 4.0 - 10.5 K/uL 6.1  5.2  6.9   Hemoglobin 12.0 - 15.0 g/dL 87.7  87.1  86.6   Hematocrit 36.0 - 46.0 % 36.7  39.3  38.9   Platelets 150 - 400 K/uL 283  298  318         Latest Ref Rng & Units 03/06/2024    7:36 AM 01/03/2024    7:42 AM 11/22/2023    9:00 AM  CMP  Glucose 70 - 99 mg/dL 97  99  891   BUN 8 - 23 mg/dL 17  16  14    Creatinine 0.44 - 1.00 mg/dL 9.19  9.14  8.83   Sodium 135 - 145 mmol/L 142  141  143   Potassium 3.5 - 5.1 mmol/L 3.7  3.8  3.6   Chloride 98 - 111 mmol/L 110  110  109   CO2 22 - 32 mmol/L 27  26  29    Calcium 8.9 - 10.3 mg/dL 9.2  9.2  9.1   Total Protein 6.5 - 8.1 g/dL 7.0  7.1  7.3   Total Bilirubin 0.0 - 1.2 mg/dL 0.4  0.4  0.5   Alkaline Phos 38 - 126 U/L 91  79  89   AST 15 - 41 U/L 15  16  14    ALT 0 - 44 U/L 14  13  10        RADIOGRAPHIC STUDIES: I have personally reviewed the radiological images as listed and agreed  with the findings in the report. No  results found.    Orders Placed This Encounter  Procedures   CBC with Differential (Cancer Center Only)    Standing Status:   Future    Expected Date:   04/18/2024    Expiration Date:   04/18/2025   CMP (Cancer Center only)    Standing Status:   Future    Expected Date:   04/18/2024    Expiration Date:   04/18/2025   CA 27.29    Standing Status:   Future    Expected Date:   04/18/2024    Expiration Date:   04/18/2025   CBC with Differential (Cancer Center Only)    Standing Status:   Future    Expected Date:   05/09/2024    Expiration Date:   05/09/2025   CMP (Cancer Center only)    Standing Status:   Future    Expected Date:   05/09/2024    Expiration Date:   05/09/2025   CA 27.29    Standing Status:   Future    Expected Date:   05/09/2024    Expiration Date:   05/09/2025   CBC with Differential (Cancer Center Only)    Standing Status:   Future    Expected Date:   05/30/2024    Expiration Date:   05/30/2025   CMP (Cancer Center only)    Standing Status:   Future    Expected Date:   05/30/2024    Expiration Date:   05/30/2025   [place] 27.29    Standing Status:   Future    Expected Date:   05/30/2024    Expiration Date:   05/30/2025   All questions were answered. The patient knows to call the clinic with any problems, questions or concerns. No barriers to learning was detected. The total time spent in the appointment was 25 minutes, including review of chart and various tests results, discussions about plan of care and coordination of care plan     Onita Mattock, MD 03/06/2024

## 2024-03-07 ENCOUNTER — Other Ambulatory Visit: Payer: Self-pay

## 2024-03-07 LAB — CANCER ANTIGEN 27.29: CA 27.29: 40.1 U/mL — ABNORMAL HIGH (ref 0.0–38.6)

## 2024-03-11 ENCOUNTER — Other Ambulatory Visit: Payer: Self-pay

## 2024-03-19 ENCOUNTER — Telehealth: Payer: Self-pay | Admitting: *Deleted

## 2024-03-19 NOTE — Telephone Encounter (Signed)
 Late Entry:   Completed [employer] FMLA paperwork for spouse sent to provider to review, make any needed amendments, sign and return to form staff.   Signed paperwork returned to this nurse.   Successfully returned via fax [phone])..   No request for medical records.  Copy to [facility] HIM bin designated for items to be scanned to EMR.   Prepared paperwork to be mailed to address on file.   [address] [place] [id number] Message left [phone]) for patient notification of above information.   Process completed with no further instructions received, actions performed or required by this nurse.

## 2024-03-28 ENCOUNTER — Inpatient Hospital Stay

## 2024-03-28 ENCOUNTER — Encounter: Payer: Self-pay | Admitting: Hematology

## 2024-03-28 ENCOUNTER — Inpatient Hospital Stay: Attending: Hematology

## 2024-03-28 VITALS — BP 127/75 | HR 75 | Temp 98.2°F | Resp 18

## 2024-03-28 DIAGNOSIS — Z170 Estrogen receptor positive status [ER+]: Secondary | ICD-10-CM | POA: Insufficient documentation

## 2024-03-28 DIAGNOSIS — Z1721 Progesterone receptor positive status: Secondary | ICD-10-CM | POA: Diagnosis not present

## 2024-03-28 DIAGNOSIS — Z5111 Encounter for antineoplastic chemotherapy: Secondary | ICD-10-CM | POA: Insufficient documentation

## 2024-03-28 DIAGNOSIS — Z1731 Human epidermal growth factor receptor 2 positive status: Secondary | ICD-10-CM | POA: Insufficient documentation

## 2024-03-28 DIAGNOSIS — C50411 Malignant neoplasm of upper-outer quadrant of right female breast: Secondary | ICD-10-CM | POA: Diagnosis not present

## 2024-03-28 DIAGNOSIS — C7951 Secondary malignant neoplasm of bone: Secondary | ICD-10-CM | POA: Diagnosis not present

## 2024-03-28 MED ORDER — ACETAMINOPHEN 325 MG PO TABS
650.0000 mg | ORAL_TABLET | Freq: Once | ORAL | Status: AC
  Administered 2024-03-28: 650 mg via ORAL
  Filled 2024-03-28: qty 2

## 2024-03-28 MED ORDER — DIPHENHYDRAMINE HCL 25 MG PO CAPS
50.0000 mg | ORAL_CAPSULE | Freq: Once | ORAL | Status: AC
  Administered 2024-03-28: 50 mg via ORAL
  Filled 2024-03-28: qty 2

## 2024-03-28 MED ORDER — PERTUZ-TRASTUZ-HYALURON-ZZXF 60-60-2000 MG-MG-U/ML CHEMO ~~LOC~~ SOLN
600.0000 mg | Freq: Once | SUBCUTANEOUS | Status: AC
  Administered 2024-03-28: 600 mg via SUBCUTANEOUS
  Filled 2024-03-28: qty 10

## 2024-03-28 NOTE — Patient Instructions (Signed)
 [facility] - A DEPT OF [facility]  Discharge Instructions: Thank you for choosing [facility] to provide your oncology and hematology care.   If you have a lab appointment with the Cancer Center, please go directly to the Cancer Center and check in at the registration area.   Wear comfortable clothing and clothing appropriate for easy access to any Portacath or PICC line.   We strive to give you quality time with your provider. You may need to reschedule your appointment if you arrive late (15 or more minutes).  Arriving late affects you and other patients whose appointments are after yours.  Also, if you miss three or more appointments without notifying the office, you may be dismissed from the clinic at the provider's discretion.      For prescription refill requests, have your pharmacy contact our office and allow 72 hours for refills to be completed.    Today you received the following chemotherapy and/or immunotherapy agents: phesgo       To help prevent nausea and vomiting after your treatment, we encourage you to take your nausea medication as directed.  BELOW ARE SYMPTOMS THAT SHOULD BE REPORTED IMMEDIATELY: *FEVER GREATER THAN 100.4 F (38 C) OR HIGHER *CHILLS OR SWEATING *NAUSEA AND VOMITING THAT IS NOT CONTROLLED WITH YOUR NAUSEA MEDICATION *UNUSUAL SHORTNESS OF BREATH *UNUSUAL BRUISING OR BLEEDING *URINARY PROBLEMS (pain or burning when urinating, or frequent urination) *BOWEL PROBLEMS (unusual diarrhea, constipation, pain near the anus) TENDERNESS IN MOUTH AND THROAT WITH OR WITHOUT PRESENCE OF ULCERS (sore throat, sores in mouth, or a toothache) UNUSUAL RASH, SWELLING OR PAIN  UNUSUAL VAGINAL DISCHARGE OR ITCHING   Items with * indicate a potential emergency and should be followed up as soon as possible or go to the Emergency Department if any problems should occur.  Please show the CHEMOTHERAPY ALERT CARD or IMMUNOTHERAPY  ALERT CARD at check-in to the Emergency Department and triage nurse.  Should you have questions after your visit or need to cancel or reschedule your appointment, please contact [facility] - A DEPT OF [facility]  Dept: [phone]  and follow the prompts.  Office hours are 8:00 a.m. to 4:30 p.m. Monday - Friday. Please note that voicemails left after 4:00 p.m. may not be returned until the following business day.  We are closed weekends and major holidays. You have access to a nurse at all times for urgent questions. Please call the main number to the clinic Dept: [phone] and follow the prompts.   For any non-urgent questions, you may also contact your provider using MyChart. We now offer e-Visits for anyone 60 and older to request care online for non-urgent symptoms. For details visit [email or web address].   Also download the MyChart app! Go to the app store, search MyChart, open the app, select [place], and log in with your MyChart username and password.

## 2024-03-31 NOTE — Progress Notes (Signed)
 CARDIO-ONCOLOGY NOTE  Referring Physician: Dr. Lanny Primary Care: Carolyn Setter, PA-C HF Cardiologist: Dr. Cherrie  Reason for Visit: f/u for cardiotoxic CM   HPI: Carolyn Stewart is 63 y.o. female with HTN and stage IV right breast cancer referred by Dr. Lanny for enrollment into the Cardio-Oncology program due to reduced EF on echo.   Diagnosed with R breast cancer in 8/21 (triple positive) found to have mets to bone. She received THP 01/03/20-05/27/20. Her restaging PET scan from 04/28/20 showed no hypermetabolic lesions. Given her excellent response, she was switched to maintenance therapy with Herceptin , Perjeta  q3weeks and Letrozole  starting 06/18/20 which is still ongoing.  Zometa  started 06/18/20.  Echo 09/2019 EF 50-55% so H/P held. Losartan  started.   Echo 7/22 with EF 55-60%. Herceptin  restarted.  Echo 9/22 EF 55% felt to be slightly down from before but still in normal range  Echo 03/09/21 EF 55%  Echo 08/31/22 EF 55-60% GLS -18.0%   Echo 02/28/23 EF 55-60%, GLS 17.1%   Echo 6/25 EF 55-60%  Today she returns for follow up. Remains on Phesgo  (Herceptin /Perjeta ) every 3 weeks. Feels great. No SOB, edema. BP good.   Echo today 04/01/24 EF 60-65% GLS -15.1 (likely underestimated) Personally reviewed   Review of Systems: Negative except as mentioned in HPI  Past Medical History:  Diagnosis Date   Cancer (HCC) 2021   Hypertension    Current Outpatient Medications  Medication Sig Dispense Refill   acetaminophen  (TYLENOL ) 500 MG tablet Take 1,000 mg by mouth every 6 (six) hours as needed for moderate pain or headache.     amLODipine  (NORVASC ) 10 MG tablet Take 2 tablets (20 mg total) by mouth daily. 60 tablet 11   Cholecalciferol (VITAMIN D3) 50 MCG (2000 UT) TABS Take 2,000 Units by mouth daily.      gabapentin  (NEURONTIN ) 100 MG capsule TAKE 2 CAPSULES BY MOUTH AT BEDTIME. 60 capsule 2   letrozole  (FEMARA ) 2.5 MG tablet Take 1 tablet (2.5 mg total) by mouth daily. 90 tablet  3   lidocaine -prilocaine  (EMLA ) cream Apply 1 Application topically as needed. 30 g 1   losartan  (COZAAR ) 100 MG tablet TAKE 1 TABLET BY MOUTH EVERY DAY 90 tablet 3   metoprolol  succinate (TOPROL -XL) 100 MG 24 hr tablet TAKE 1 TABLET BY MOUTH EVERY DAY 90 tablet 3   omeprazole (PRILOSEC OTC) 20 MG tablet Take 20 mg by mouth daily.     potassium chloride  (KLOR-CON  M) 10 MEQ tablet TAKE 1 TABLET BY MOUTH EVERY DAY 90 tablet 1   No current facility-administered medications for this encounter.   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   Tobacco Use   Smoking status: Never   Smokeless tobacco: Never  Vaping Use   Vaping status: Never Used  Substance and Sexual Activity   Alcohol use: Yes    Comment: social drinker    Drug use: No   Sexual activity: Yes  Other Topics Concern   Not on file  Social History Narrative   Not on file   Social Drivers of Health   Financial Resource Strain: Low Risk  (12/03/2019)   Overall Financial Resource Strain (CARDIA)    Difficulty of Paying Living Expenses: Not hard at all  Food Insecurity: No Food Insecurity (03/06/2024)   Hunger Vital Sign    Worried About Running Out of Food in the Last Year: Never true    Ran Out of Food in the Last Year: Never true  Transportation Needs: No Transportation Needs (03/06/2024)   PRAPARE - Administrator, Civil Service (Medical): No    Lack of Transportation (Non-Medical): No  Physical Activity: Not on file  Stress: Not on file  Social Connections: Not  on file  Intimate Partner Violence: Not At Risk (03/06/2024)   Humiliation, Afraid, Rape, and Kick questionnaire    Fear of Current or Ex-Partner: No    Emotionally Abused: No    Physically Abused: No    Sexually Abused: No   Family History  Problem Relation Age of Onset   Hypertension Mother    Hypertension Father    LMP 01/19/2013   Vitals:   04/01/24 1045  BP: 130/80  Pulse: 80  SpO2: 97%  Weight: 91.4 kg (201 lb 6.4 oz)    Wt Readings from Last 3 Encounters:  03/06/24 93 kg (205 lb)  01/03/24 94.5 kg (208 lb 4.8 oz)  11/22/23 96.6 kg (212 lb 14.4 oz)   PHYSICAL EXAM: General:  Sitting up. No resp difficulty HEENT: normal Neck: supple. no JVD.  Cor: Regular rate & rhythm. No rubs, gallops or murmurs. Lungs: clear Abdomen: soft, nontender, nondistended.Good bowel sounds. Extremities: no cyanosis, clubbing, rash, edema Neuro: alert & orientedx3, cranial nerves grossly intact. moves all 4 extremities w/o difficulty. Affect pleasant   EKG: n/a   ASSESSMENT & PLAN: 1.  Chemotherapy-induced CM - Echo 8/21: EF 60-65% G1DD - Echo 12/21: EF 60-65% GLS -18.2 - Echo  09/22/20: EF 40-45%  (I felt 50-55%) - Herceptin  held in June 2022 d/t mild cardiomyopathy. Restarted in August 2022 - Remains on Phesgo  (Herceptin /Perjeta ) every 3 weeks - Echo 7/22: EF 55-60% GLS 16 - Echo 9/22 F 55%. - Echo 11/22 EF 55% (completely stable) - Echo 2/23: EF 55-60% GLS -20% - Echo 6/23 EF 55-60% GLS -22.4% - Echo 5/24 EF 55-60% GLS -18.0%  - Echo 11/24 55-60% GLS 17%  - Echo 6/25 EF 55-60% - Echo today 04/01/24 EF 60-65% GLS -15.1 (likely underestimated) Personally reviewed - NYHA Class I. Evuolemic  - Continue losartan  100 mg daily. - Continue Toprol  XL 100 mg daily - Continue Phesgo  per Dr. Lanny.  - Continue echos q 6 months. - I reviewed recent labs, SCr and K stable  - Echo stable Continue Phesgo    2. Stage IV Right Breast Cancer (triple positive) - Diagnosed with R breast  cancer in 8/21 (triple positive) found to have mets to bone. She received THP 01/03/20-05/27/20. Her restaging PET scan from 04/28/20 showed no hypermetabolic lesions. Given her excellent response, she was switched to maintenance therapy with Herceptin , Perjeta  q3weeks and Letrozole  starting 06/18/20.  Zometa  started 06/18/20, will continue every 3 months. - Herceptin  held 09/22/20 due to reduced EF, restarted in August as above.  - Now on maintenance Herceptin , Perjeta  (Phesgo ) q3weeks and Letrozole   - EF stable continue Phesgo . - Surveillance echos q6 months or with symotoms  3. HTN - controlled, home SBPs 120s  - continue current regimen.  SABRA  Toribio Fuel, MD  10:15 PM

## 2024-04-01 ENCOUNTER — Inpatient Hospital Stay (HOSPITAL_COMMUNITY)
Admission: RE | Admit: 2024-04-01 | Discharge: 2024-04-01 | Disposition: A | Source: Ambulatory Visit | Attending: Internal Medicine | Admitting: Internal Medicine

## 2024-04-01 ENCOUNTER — Ambulatory Visit (HOSPITAL_COMMUNITY)
Admission: RE | Admit: 2024-04-01 | Discharge: 2024-04-01 | Disposition: A | Source: Ambulatory Visit | Attending: Cardiology | Admitting: Cardiology

## 2024-04-01 ENCOUNTER — Encounter (HOSPITAL_COMMUNITY): Payer: Self-pay | Admitting: Internal Medicine

## 2024-04-01 VITALS — BP 130/80 | HR 80 | Wt 201.4 lb

## 2024-04-01 DIAGNOSIS — C50919 Malignant neoplasm of unspecified site of unspecified female breast: Secondary | ICD-10-CM

## 2024-04-01 DIAGNOSIS — I10 Essential (primary) hypertension: Secondary | ICD-10-CM

## 2024-04-01 DIAGNOSIS — I427 Cardiomyopathy due to drug and external agent: Secondary | ICD-10-CM

## 2024-04-01 LAB — ECHOCARDIOGRAM COMPLETE
Area-P 1/2: 3.68 cm2
Calc EF: 51.7 %
S' Lateral: 3.3 cm
Single Plane A2C EF: 61.2 %
Single Plane A4C EF: 43 %

## 2024-04-01 MED ORDER — AMLODIPINE BESYLATE 10 MG PO TABS: 20.0000 mg | ORAL_TABLET | Freq: Every day | ORAL | 3 refills | Status: AC

## 2024-04-01 NOTE — Addendum Note (Signed)
 Encounter addended by: Marcelina Lisa HERO, RN on: 04/01/2024 11:00 AM  Actions taken: Order list changed, Diagnosis association updated, Clinical Note Signed

## 2024-04-01 NOTE — Addendum Note (Signed)
 Encounter addended by: Marcelina Lisa HERO, RN on: 04/01/2024 11:03 AM  Actions taken: Order list changed

## 2024-04-01 NOTE — Patient Instructions (Signed)
 There has been no changes to your medications.  Your physician has requested that you have an echocardiogram. Echocardiography is a painless test that uses sound waves to create images of your heart. It provides your doctor with information about the size and shape of your heart and how well your heart's chambers and valves are working. This procedure takes approximately one hour. There are no restrictions for this procedure. Please do NOT wear cologne, perfume, aftershave, or lotions (deodorant is allowed). Please arrive 15 minutes prior to your appointment time.  Please note: We ask at that you not bring children with you during ultrasound (echo/ vascular) testing. Due to room size and safety concerns, children are not allowed in the ultrasound rooms during exams. Our front office staff cannot provide observation of children in our lobby area while testing is being conducted. An adult accompanying a patient to their appointment will only be allowed in the ultrasound room at the discretion of the ultrasound technician under special circumstances. We apologize for any inconvenience.  Your physician recommends that you schedule a follow-up appointment in: 6 months ( June 2026) ** PLEASE CALL THE OFFICE IN APRIL 2026 TO ARRANGE YOUR FOLLOW UP APPOINTMENT.**  If you have any questions or concerns before your next appointment please send us  a message through [facility] or call our office at [phone].    TO LEAVE A MESSAGE FOR THE NURSE SELECT OPTION 2, PLEASE LEAVE A MESSAGE INCLUDING: YOUR NAME DATE OF BIRTH CALL BACK NUMBER REASON FOR CALL**this is important as we prioritize the call backs  YOU WILL RECEIVE A CALL BACK THE SAME DAY AS LONG AS YOU CALL BEFORE 4:00 PM  At the [facility], you and your health needs are our priority. As part of our continuing mission to provide you with exceptional heart care, we have created designated Provider Care Teams. These Care Teams include  your primary Cardiologist (physician) and Advanced Practice Providers (APPs- Physician Assistants and Nurse Practitioners) who all work together to provide you with the care you need, when you need it.   You may see any of the following providers on your designated Care Team at your next follow up: Dr Toribio Fuel Dr Ezra Shuck Dr. Morene Brownie Greig Mosses, NP Caffie Shed, [place] [facility] [place], [place] Beckey Coe, NP Jordan Lee, NP Ellouise Class, NP Tinnie Redman, PharmD Jaun Bash, PharmD   Please be sure to bring in all your medications bottles to every appointment.    Thank you for choosing [facility]

## 2024-04-14 ENCOUNTER — Other Ambulatory Visit: Payer: Self-pay

## 2024-04-18 ENCOUNTER — Inpatient Hospital Stay

## 2024-04-18 VITALS — BP 148/66 | HR 87 | Temp 98.0°F | Resp 16 | Wt 202.5 lb

## 2024-04-18 DIAGNOSIS — Z170 Estrogen receptor positive status [ER+]: Secondary | ICD-10-CM

## 2024-04-18 DIAGNOSIS — Z5111 Encounter for antineoplastic chemotherapy: Secondary | ICD-10-CM | POA: Diagnosis not present

## 2024-04-18 MED ORDER — ACETAMINOPHEN 325 MG PO TABS
650.0000 mg | ORAL_TABLET | Freq: Once | ORAL | Status: AC
  Administered 2024-04-18: 650 mg via ORAL
  Filled 2024-04-18: qty 2

## 2024-04-18 MED ORDER — PERTUZ-TRASTUZ-HYALURON-ZZXF 60-60-2000 MG-MG-U/ML CHEMO ~~LOC~~ SOLN
600.0000 mg | Freq: Once | SUBCUTANEOUS | Status: AC
  Administered 2024-04-18: 600 mg via SUBCUTANEOUS
  Filled 2024-04-18: qty 10

## 2024-04-18 MED ORDER — DIPHENHYDRAMINE HCL 25 MG PO CAPS
50.0000 mg | ORAL_CAPSULE | Freq: Once | ORAL | Status: AC
  Administered 2024-04-18: 50 mg via ORAL
  Filled 2024-04-18: qty 2

## 2024-04-18 NOTE — Patient Instructions (Signed)
 [facility] - A DEPT OF [facility]  Discharge Instructions: Thank you for choosing [facility] to provide your oncology and hematology care.   If you have a lab appointment with the Cancer Center, please go directly to the Cancer Center and check in at the registration area.   Wear comfortable clothing and clothing appropriate for easy access to any Portacath or PICC line.   We strive to give you quality time with your provider. You may need to reschedule your appointment if you arrive late (15 or more minutes).  Arriving late affects you and other patients whose appointments are after yours.  Also, if you miss three or more appointments without notifying the office, you may be dismissed from the clinic at the provider's discretion.      For prescription refill requests, have your pharmacy contact our office and allow 72 hours for refills to be completed.    Today you received the following chemotherapy and/or immunotherapy agents phesgo      To help prevent nausea and vomiting after your treatment, we encourage you to take your nausea medication as directed.  BELOW ARE SYMPTOMS THAT SHOULD BE REPORTED IMMEDIATELY: *FEVER GREATER THAN 100.4 F (38 C) OR HIGHER *CHILLS OR SWEATING *NAUSEA AND VOMITING THAT IS NOT CONTROLLED WITH YOUR NAUSEA MEDICATION *UNUSUAL SHORTNESS OF BREATH *UNUSUAL BRUISING OR BLEEDING *URINARY PROBLEMS (pain or burning when urinating, or frequent urination) *BOWEL PROBLEMS (unusual diarrhea, constipation, pain near the anus) TENDERNESS IN MOUTH AND THROAT WITH OR WITHOUT PRESENCE OF ULCERS (sore throat, sores in mouth, or a toothache) UNUSUAL RASH, SWELLING OR PAIN  UNUSUAL VAGINAL DISCHARGE OR ITCHING   Items with * indicate a potential emergency and should be followed up as soon as possible or go to the Emergency Department if any problems should occur.  Please show the CHEMOTHERAPY ALERT CARD or IMMUNOTHERAPY  ALERT CARD at check-in to the Emergency Department and triage nurse.  Should you have questions after your visit or need to cancel or reschedule your appointment, please contact [facility] - A DEPT OF [facility]  Dept: [phone]  and follow the prompts.  Office hours are 8:00 a.m. to 4:30 p.m. Monday - Friday. Please note that voicemails left after 4:00 p.m. may not be returned until the following business day.  We are closed weekends and major holidays. You have access to a nurse at all times for urgent questions. Please call the main number to the clinic Dept: [phone] and follow the prompts.   For any non-urgent questions, you may also contact your provider using MyChart. We now offer e-Visits for anyone 38 and older to request care online for non-urgent symptoms. For details visit [email or web address].   Also download the MyChart app! Go to the app store, search "MyChart", open the app, select [place], and log in with your MyChart username and password.

## 2024-04-22 ENCOUNTER — Other Ambulatory Visit: Payer: Self-pay

## 2024-05-08 ENCOUNTER — Inpatient Hospital Stay: Attending: Hematology

## 2024-05-08 ENCOUNTER — Inpatient Hospital Stay

## 2024-05-08 ENCOUNTER — Inpatient Hospital Stay: Admitting: Hematology

## 2024-05-08 VITALS — BP 130/76 | HR 79 | Temp 98.3°F | Resp 17 | Ht 67.0 in | Wt 204.8 lb

## 2024-05-08 VITALS — BP 112/69 | HR 72 | Resp 16

## 2024-05-08 DIAGNOSIS — Z1721 Progesterone receptor positive status: Secondary | ICD-10-CM | POA: Diagnosis not present

## 2024-05-08 DIAGNOSIS — C7951 Secondary malignant neoplasm of bone: Secondary | ICD-10-CM | POA: Diagnosis not present

## 2024-05-08 DIAGNOSIS — C50411 Malignant neoplasm of upper-outer quadrant of right female breast: Secondary | ICD-10-CM | POA: Insufficient documentation

## 2024-05-08 DIAGNOSIS — T451X5A Adverse effect of antineoplastic and immunosuppressive drugs, initial encounter: Secondary | ICD-10-CM | POA: Diagnosis not present

## 2024-05-08 DIAGNOSIS — Z1731 Human epidermal growth factor receptor 2 positive status: Secondary | ICD-10-CM | POA: Diagnosis not present

## 2024-05-08 DIAGNOSIS — G620 Drug-induced polyneuropathy: Secondary | ICD-10-CM | POA: Diagnosis not present

## 2024-05-08 DIAGNOSIS — Z5111 Encounter for antineoplastic chemotherapy: Secondary | ICD-10-CM | POA: Diagnosis present

## 2024-05-08 DIAGNOSIS — Z79811 Long term (current) use of aromatase inhibitors: Secondary | ICD-10-CM | POA: Diagnosis not present

## 2024-05-08 DIAGNOSIS — Z79899 Other long term (current) drug therapy: Secondary | ICD-10-CM | POA: Diagnosis not present

## 2024-05-08 DIAGNOSIS — Z170 Estrogen receptor positive status [ER+]: Secondary | ICD-10-CM | POA: Diagnosis not present

## 2024-05-08 LAB — CBC WITH DIFFERENTIAL (CANCER CENTER ONLY)
Abs Immature Granulocytes: 0.02 K/uL (ref 0.00–0.07)
Basophils Absolute: 0 K/uL (ref 0.0–0.1)
Basophils Relative: 0 %
Eosinophils Absolute: 0.2 K/uL (ref 0.0–0.5)
Eosinophils Relative: 3 %
HCT: 38 % (ref 36.0–46.0)
Hemoglobin: 12.9 g/dL (ref 12.0–15.0)
Immature Granulocytes: 0 %
Lymphocytes Relative: 46 %
Lymphs Abs: 3.1 K/uL (ref 0.7–4.0)
MCH: 30.1 pg (ref 26.0–34.0)
MCHC: 33.9 g/dL (ref 30.0–36.0)
MCV: 88.8 fL (ref 80.0–100.0)
Monocytes Absolute: 0.8 K/uL (ref 0.1–1.0)
Monocytes Relative: 12 %
Neutro Abs: 2.7 K/uL (ref 1.7–7.7)
Neutrophils Relative %: 39 %
Platelet Count: 309 K/uL (ref 150–400)
RBC: 4.28 MIL/uL (ref 3.87–5.11)
RDW: 14.1 % (ref 11.5–15.5)
WBC Count: 6.8 K/uL (ref 4.0–10.5)
nRBC: 0 % (ref 0.0–0.2)

## 2024-05-08 LAB — CMP (CANCER CENTER ONLY)
ALT: 8 U/L (ref 0–44)
AST: 18 U/L (ref 15–41)
Albumin: 4.3 g/dL (ref 3.5–5.0)
Alkaline Phosphatase: 99 U/L (ref 38–126)
Anion gap: 10 (ref 5–15)
BUN: 11 mg/dL (ref 8–23)
CO2: 25 mmol/L (ref 22–32)
Calcium: 8.9 mg/dL (ref 8.9–10.3)
Chloride: 106 mmol/L (ref 98–111)
Creatinine: 0.89 mg/dL (ref 0.44–1.00)
GFR, Estimated: >60 mL/min
Glucose, Bld: 94 mg/dL (ref 70–99)
Potassium: 4 mmol/L (ref 3.5–5.1)
Sodium: 141 mmol/L (ref 135–145)
Total Bilirubin: 0.4 mg/dL (ref 0.0–1.2)
Total Protein: 7.3 g/dL (ref 6.5–8.1)

## 2024-05-08 MED ORDER — LIDOCAINE-PRILOCAINE 2.5-2.5 % EX CREA: TOPICAL_CREAM | CUTANEOUS | 3 refills | Status: AC

## 2024-05-08 MED ORDER — ZOLEDRONIC ACID 4 MG/100ML IV SOLN
4.0000 mg | Freq: Once | INTRAVENOUS | Status: AC
  Administered 2024-05-08: 4 mg via INTRAVENOUS
  Filled 2024-05-08: qty 100

## 2024-05-08 MED ORDER — SODIUM CHLORIDE 0.9 % IV SOLN: Freq: Once | INTRAVENOUS | Status: AC

## 2024-05-08 MED ORDER — PERTUZ-TRASTUZ-HYALURON-ZZXF 60-60-2000 MG-MG-U/ML CHEMO ~~LOC~~ SOLN
600.0000 mg | Freq: Once | SUBCUTANEOUS | Status: AC
  Administered 2024-05-08: 600 mg via SUBCUTANEOUS
  Filled 2024-05-08: qty 10

## 2024-05-08 MED ORDER — ACETAMINOPHEN 325 MG PO TABS
650.0000 mg | ORAL_TABLET | Freq: Once | ORAL | Status: AC
  Administered 2024-05-08: 650 mg via ORAL
  Filled 2024-05-08: qty 2

## 2024-05-08 MED ORDER — DIPHENHYDRAMINE HCL 25 MG PO CAPS
50.0000 mg | ORAL_CAPSULE | Freq: Once | ORAL | Status: AC
  Administered 2024-05-08: 50 mg via ORAL
  Filled 2024-05-08: qty 2

## 2024-05-08 NOTE — Assessment & Plan Note (Signed)
 stage IV [phone]), with bone and possible liver mets,  ER+/PR+/HER2+, Grade II-III   --diagnosed in 11/2019 with large right breast mass measuring up to 11cm and right lymphadenopathy. 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 Left axillary node biopsy was benign.  -01/01/20 Liver biopsy was negative but 01/12/20 Bone Biopsy was positive for metastatic carcinoma from primary breast cancer. ER/PR/HER2 were all negative, possibly related to decalcification. -She received THP 01/03/20 - 2/3/2 -on maintenance HP Phesgo  injection every 3 weeks -last staging scan in 08/2020, not repeated due to lack of insurance and she is clinically doing well  -repeated CT and bone scan 05/05/2022 showed good partial response in liver, minimum uptake in bone mets, no concerns for new lesion or evidence of progression.  I personally reviewed his scan images and discussed with patient. -Will continue Phesgo  injection every 3 weeks and letrozole , she is tolerating well  -CT 07/02/2023 and 01/03/2024 was negative for residual disease

## 2024-05-08 NOTE — Progress Notes (Signed)
 Pt observed for 15 minutes post Phesgo  infusion. Pt tolerated Tx well w/out incident. VSS at discharge.  Ambulatory to lobby.

## 2024-05-08 NOTE — Patient Instructions (Signed)
 [facility] - A DEPT OF [facility]  Discharge Instructions: Thank you for choosing [facility] to provide your oncology and hematology care.   If you have a lab appointment with the Cancer Center, please go directly to the Cancer Center and check in at the registration area.   Wear comfortable clothing and clothing appropriate for easy access to any Portacath or PICC line.   We strive to give you quality time with your provider. You may need to reschedule your appointment if you arrive late (15 or more minutes).  Arriving late affects you and other patients whose appointments are after yours.  Also, if you miss three or more appointments without notifying the office, you may be dismissed from the clinic at the provider's discretion.      For prescription refill requests, have your pharmacy contact our office and allow 72 hours for refills to be completed.    Today you received the following chemotherapy and/or immunotherapy agents phesgo      To help prevent nausea and vomiting after your treatment, we encourage you to take your nausea medication as directed.  BELOW ARE SYMPTOMS THAT SHOULD BE REPORTED IMMEDIATELY: *FEVER GREATER THAN 100.4 F (38 C) OR HIGHER *CHILLS OR SWEATING *NAUSEA AND VOMITING THAT IS NOT CONTROLLED WITH YOUR NAUSEA MEDICATION *UNUSUAL SHORTNESS OF BREATH *UNUSUAL BRUISING OR BLEEDING *URINARY PROBLEMS (pain or burning when urinating, or frequent urination) *BOWEL PROBLEMS (unusual diarrhea, constipation, pain near the anus) TENDERNESS IN MOUTH AND THROAT WITH OR WITHOUT PRESENCE OF ULCERS (sore throat, sores in mouth, or a toothache) UNUSUAL RASH, SWELLING OR PAIN  UNUSUAL VAGINAL DISCHARGE OR ITCHING   Items with * indicate a potential emergency and should be followed up as soon as possible or go to the Emergency Department if any problems should occur.  Please show the CHEMOTHERAPY ALERT CARD or IMMUNOTHERAPY  ALERT CARD at check-in to the Emergency Department and triage nurse.  Should you have questions after your visit or need to cancel or reschedule your appointment, please contact [facility] - A DEPT OF [facility]  Dept: [phone]  and follow the prompts.  Office hours are 8:00 a.m. to 4:30 p.m. Monday - Friday. Please note that voicemails left after 4:00 p.m. may not be returned until the following business day.  We are closed weekends and major holidays. You have access to a nurse at all times for urgent questions. Please call the main number to the clinic Dept: [phone] and follow the prompts.   For any non-urgent questions, you may also contact your provider using MyChart. We now offer e-Visits for anyone 38 and older to request care online for non-urgent symptoms. For details visit [email or web address].   Also download the MyChart app! Go to the app store, search "MyChart", open the app, select [place], and log in with your MyChart username and password.

## 2024-05-08 NOTE — Progress Notes (Signed)
 " [facility]   [phone] [phone]   Clinic Follow up Note   Patient Care Team: Asuncion Setter, PA-C as PCP - General (Physician Assistant) Aron Shoulders, MD as Consulting Physician (General Surgery) Lanny Callander, MD as Consulting Physician (Hematology) Dewey Rush, MD as Consulting Physician (Radiation Oncology) Tyree Nanetta SAILOR, RN as Oncology Nurse Navigator  Date of Service:  05/08/2024  CHIEF COMPLAINT: f/u of metastatic breast cancer  CURRENT THERAPY:  Letrozole  and phesgo   Oncology History   Metastasis to bone [facility]) -from breast cancer  --bone scan on 12/10/19 showed metastatic disease in left acetabulum, L3, and right 10th rib.  --Zometa  started 06/18/20, but held after second dose due to loss of insurance. Restarted in 05/2022    Malignant neoplasm of upper-outer quadrant of right breast in female, estrogen receptor positive (HCC) stage IV r([id number]), with bone and possible liver mets,  ER+/PR+/HER2+, Grade II-III   --diagnosed in 11/2019 with large right breast mass measuring up to 11cm and right lymphadenopathy. 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 Left axillary node biopsy was benign.  -01/01/20 Liver biopsy was negative but 01/12/20 Bone Biopsy was positive for metastatic carcinoma from primary breast cancer. ER/PR/HER2 were all negative, possibly related to decalcification. -She received THP 01/03/20 - 2/3/2 -on maintenance HP Phesgo  injection every 3 weeks -last staging scan in 08/2020, not repeated due to lack of insurance and she is clinically doing well  -repeated CT and bone scan 05/05/2022 showed good partial response in liver, minimum uptake in bone mets, no concerns for new lesion or evidence of progression.  I personally reviewed his scan images and discussed with patient. -Will continue Phesgo  injection every 3 weeks and letrozole , she is tolerating well  -CT 07/02/2023  and 01/03/2024 was negative for residual disease   Assessment & Plan Metastatic estrogen receptor positive right breast cancer with bone metastases Disease remains well controlled on current regimen, including HER2-targeted therapy and letrozole . She tolerates therapy well overall, with neuropathy as the primary adverse effect. Recent echocardiogram was normal. Cardiology follow-up is ongoing. Surveillance imaging is due. Consideration given to modifying HER2-targeted therapy to reduce neuropathy risk, with trastuzumab  administration route dependent on insurance approval. - Planned to change HER2-targeted therapy from Farxiga injection (combination of two antibodies) to single-agent trastuzumab , with preference for injection if approved by insurance, otherwise short infusion will be used. - Ordered repeat CT scan and bone scan in early March for disease surveillance. - Confirmed normal echocardiogram and ongoing cardiology follow-up. - Continued letrozole  therapy; prescription refilled in July and valid for one year. - Continued potassium supplementation. - Scheduled next three oncology appointments every three weeks. - Coordinated port flush, laboratory studies, and imaging to be performed on the same day prior to next visit. - Arranged for her to receive scan reports prior to next visit if desired. - Advised to contact office if insurance approval for trastuzumab  injection is not received by mid next week.  Chemotherapy-induced peripheral neuropathy Experiencing worsening neuropathy with numbness, paresthesia, and cramping in feet and hands, impairing dexterity, balance, and increasing fall risk. Gabapentin  provides partial relief for paresthesia and pain but is less effective for numbness. Potassium, magnesium, and calcium are used for cramping. No pharmacologic therapy improves numbness, but physical therapy and exercise may benefit balance and fall prevention. - Increased nighttime gabapentin   dose to 300 mg. - Advised 100 mg gabapentin  during daytime as needed for paresthesia. - Referred to physical  therapy for neuropathy management and fall prevention; provided information on local cancer rehabilitation options. - Continued potassium, magnesium, and calcium supplementation. - Discussed that no medication improves numbness, but physical therapy and exercise may help with balance and fall prevention.  Plan - Lab reviewed, will proceed phesgo  injection today, and changed to trastuzumab  infusion or injection in 3 weeks due to her neuropathy - Continue letrozole  - Follow-up in 9 weeks with lab, CT scan and bone scan 1 week before   SUMMARY OF ONCOLOGIC HISTORY: Oncology History Overview Note  Cancer Staging Malignant neoplasm of upper-outer quadrant of right breast in female, estrogen receptor positive (HCC) Staging form: Breast, AJCC 8th Edition - Clinical stage from 11/25/2019: Stage IIA (cT3, cN1, cM0, G2, ER+, PR+, HER2+) - Signed by Lanny Callander, MD on 12/03/2019    Malignant neoplasm of upper-outer quadrant of right breast in female, estrogen receptor positive (HCC)  11/13/2019 Mammogram    Diagnostic Mammogram 11/13/19  IMPRESSION: -Highly suspicious mass and calcifications in the right breast centered between 9 and 12 o'clock spanning up to 11 cm.  -Multiple masses in the right axilla. One of the masses contains calcifications, denoted as mass number 2 measuring 9 x 11 by 10 mm. Several of these masses do not appear to represent definitive lymph nodes. Others may be small but abnormal appearing lymph nodes. Taken in total, a cluster of masses in the right axilla spans up to 3.7 cm.  -There are fibrocystic changes on the left. There are also some solid-appearing masses. A solid appearing mass at 10:30, 6 cm from the nipple measures 9 x 6 x 6 mm. An adjacent solid mass at 10 o'clock, 6 cm from the nipple measures 8 x 6 by 5 mm. Another solid-appearing mass at 11 o'clock, 1 cm  from the nipple measures 5 x 4 by 5 mm. Fibrocystic changes are seen at 10 o'clock, 12 o'clock, and 4 o'clock. Another solid-appearing mass seen at 5 o'clock, 4 cm from the nipple measuring 6 x 3 x 4 mm. There is a single abnormal node in the left axilla with a cortex measuring 5 mm and restriction of the fatty hilum.   11/25/2019 Initial Biopsy   Diagnosis 1. Breast, right, needle core biopsy, upper outer - INVASIVE MAMMARY CARCINOMA - MAMMARY CARCINOMA IN SITU WITH CALCIFICATIONS AND NECROSIS - SEE COMMENT 2. Lymph node, needle/core biopsy, right axilla - INVASIVE MAMMARY CARCINOMA - SEE COMMENT 3. Lymph node, needle/core biopsy, left axilla - BENIGN LYMPH NODE - NO CARCINOMA IDENTIFIED Microscopic Comment 1. The biopsy material shows an infiltrative proliferation of cells arranged linearly and in small clusters. Based on the biopsy, the carcinoma appears Nottingham grade 2 of 3 and measures 1.2 cm in greatest linear extent. E-cadherin and prognostic markers (ER/PR/ki-67/HER2)are pending and will be reported in an addendum. Dr. Alvaro reviewed the case and agrees with the above diagnosis. These results were called to [facility] on November 26, 2019. 2. Definitive nodal tissue is not identified. This biopsy has a slightly different architecture morphology than what is seen in part 1. The carcinoma measures 0.7 cm in greatest dimension. E-cadherin and prognostic markers (ER, PR, Ki-67 and HER-2) are pending and will be reported in an addendum.   11/25/2019 Receptors her2   1. PROGNOSTIC INDICATORS Results: IMMUNOHISTOCHEMICAL AND MORPHOMETRIC ANALYSIS PERFORMED MANUALLY The tumor cells are POSITIVE for Her2 (3+). Estrogen Receptor: 90%, POSITIVE, STRONG STAINING INTENSITY Progesterone Receptor: 20%, POSITIVE, STRONG STAINING INTENSITY Proliferation Marker Ki67: 30%  2.  PROGNOSTIC INDICATORS Results: IMMUNOHISTOCHEMICAL AND MORPHOMETRIC ANALYSIS PERFORMED  MANUALLY The tumor cells are POSITIVE for Her2 (3+). Estrogen Receptor: 90%, POSITIVE, STRONG STAINING INTENSITY Progesterone Receptor: 25%, POSITIVE, STRONG STAINING INTENSITY Proliferation Marker Ki67: 30%   11/25/2019 Cancer Staging   Staging form: Breast, AJCC 8th Edition - Clinical stage from 11/25/2019: Stage IIA (cT3, cN1, cM0, G2, ER+, PR+, HER2+) - Signed by Lanny Callander, MD on 12/03/2019   12/01/2019 Initial Diagnosis   Malignant neoplasm of upper-outer quadrant of right breast in female, estrogen receptor positive (HCC)   12/10/2019 Imaging   Bone Scan  IMPRESSION: Findings concerning for bony metastatic disease in the left acetabulum region, L3 vertebral body region, and anterolateral right tenth rib.   Increased uptake in several large joints likely is of arthropathic etiology.   12/11/2019 Imaging   CT CAP w contrast  IMPRESSION: Prominent glandular tissue in the central right breast with nipple retraction, likely corresponding to the patient's known right breast neoplasm.   Prominent right axillary nodes measuring up to 8 mm short axis, suspicious for nodal metastases in this clinical context.   3 mm subpleural pulmonary nodule in the right lower lobe, likely benign. However, follow-up CT chest is suggested in 3-6 months.   Multiple hepatic lesions, including a dominant 2.4 cm lesion in segment 6, which is considered indeterminate. Metastasis is not excluded. Consider MRI abdomen with/without contrast for further characterization.   11 mm sclerotic lesion along the left posterior aspect of the T7 vertebral body raises concern for isolated osseous metastasis. Correlate with priors if available. Benign vertebral hemangioma at T11.   12/12/2019 Breast MRI   IMPRESSION: 1. The biopsy proven malignancy in the right breast involves all 4 quadrants and spans up to 9 cm by MRI. There is enhancement within the skin at the level of the nipple.   2. Multiple matted masses  are seen in the right axilla, either abnormal metastatic lymph nodes or a combination of masses and metastatic lymph nodes. One of these has been previously biopsied showing invasive mammary carcinoma without definitive nodal tissue.   3.  There is a 1.5 cm Rotter's lymph node on the right.   4. There is markedly abnormal enhancement and edema in the right pectoralis major muscle spanning 6-7 cm, suspicious for metastatic disease.   5. There is diffuse nodular enhancement throughout the left breast, which may correspond with the solid-appearing masses seen on ultrasound. These all have a similar appearance on MRI.   6.  No findings of left axillary lymphadenopathy.   12/15/2019 Procedure   PAC placed    12/24/2019 Imaging   MRI abdomen  IMPRESSION: 1. Multifocal enhancing bone lesions are noted within the lumbar spine, and bony pelvis compatible with metastatic disease. 2. Four, small peripherally enhancing cystic lesions are noted within the liver worrisome for metastatic disease. 3. 2 enhancing liver lesions are also noted with signal and enhancement characteristics consistent with benign liver hemangioma. 4. Well-circumscribed, enhancing T2 and T1 hypointense structure within the right adnexal region is indeterminate. It is unclear whether not this is arising from the right ovary, broad ligament or right side of uterine fundus. Differential considerations include fibrothecoma, versus pedunculated uterine leiomyoma. Metastatic disease is considered less favored. Attention on follow-up imaging is recommended.     01/03/2020 - 05/27/2020 Chemotherapy   First line THP q3weeks starting 01/03/20-05/27/20   01/05/2020 Imaging   US  Abdomen  IMPRESSION: 1. Suspect hematoma in the medial right lobe of the liver in the area  of previous biopsy. Note that recent MR does not show lesion in this area. This area has ill-defined margins and measures 3.9 x 4.0 x 3.5 cm. No perihepatic fluid.    2. Probable hemangiomas elsewhere in the right lobe of the liver. Note that several smaller lesions seen on recent MR are not appreciable by ultrasound.   3.  Study otherwise unremarkable.   01/12/2020 Pathology Results   FINAL MICROSCOPIC DIAGNOSIS:   A. BONE, SCLEROTIC LESION, LEFT ANTERIOR ILIAC SPINE, BIOPSY:  - Metastatic carcinoma, consistent with patient's clinical history of  primary breast carcinoma  - See comment   COMMENT:  Immunohistochemical stains show that the tumor cells are positive for  CK7 and GATA3; and negative for CK20, consistent with above  interpretation.   PROGNOSTIC INDICATOR RESULTS:  The tumor cells are NEGATIVE for Her2 (0); see comment  Estrogen Receptor: NEGATIVE; see comment  Progesterone Receptor: NEGATIVE; see comment    03/23/2020 Imaging   CT CAP IMPRESSION: 1. Right lower lobe perifissural nodule is slightly more conspicuous on today's study. Close attention on follow-up recommended. 2. Interval progression of segment IV liver lesion, concerning for metastatic progression. The remaining visualized liver lesions are stable to smaller in the interval. 3. Interval evolution in appearance of thoracolumbar spinal lesions and left iliac crest lesion, potentially reflecting response to therapy/healing. No definite new osseous lesions on today's study. 4. New small fluid collection right cul-de-sac. 5. Probable fibroid change in the uterus including exophytic right fundal lesion. 6. Aortic Atherosclerosis (ICD10-I70.0).   03/31/2020 Imaging   MR ABD IMPRESSION: 1. Enlarging hepatic lesion in hepatic subsegment IV suspicious for hepatic metastasis. The it is uncertain whether the change in the short interval is due to technical factors with different modalities or true enlargement. 2. Lesion in the posterior RIGHT hepatic lobe with imaging features that are atypical for hemangioma but stability and signs of enhancement on later phase raising  this question. Another more classic appearing may angioma seen inferior to this location in hepatic subsegment VI. Attention on follow-up. 3. Heterogeneous enhancement throughout the spine particularly at L3 as exhibited on the prior study with very patchy marrow signal remains suspicious for bony metastatic lesions in this patient with known bone metastases.   04/01/2020 Imaging   Bone scan IMPRESSION: Good response to therapy with resolution of sites of uptake seen previously at the cervical spine and posterior RIGHT tenth rib as well as a diminished degree of uptake at remaining previously identified osseous metastatic foci.   04/28/2020 PET scan   IMPRESSION: 1. No hypermetabolic lesions to suggest active metastatic disease involving the liver or osseous structures. 2. Stable small low-attenuation liver lesions, likely treated disease.   06/18/2020 -  Chemotherapy   Given her excellent response, I switched to maintenance therapy with Herceptin , Perjeta  (Phesgo ) q3weeks and Letrozole .   06/18/2020 -  Chemotherapy   Zometa  starting 06/18/20   06/18/2020 -  Anti-estrogen oral therapy   Letrozole  2.5mg  once dialy.    06/18/2020 - 06/16/2022 Chemotherapy   Patient is on Treatment Plan : BREAST Trastuzumab  + Pertuzumab  q21d     09/03/2020 Imaging   Bone Scan IMPRESSION: 1. Significant interval improvement compared to previous studies. Only mild uptake remains in the pelvis as above. There has been significant interval improvement over time.  CT C/A/P IMPRESSION: 1. Hypodense liver lesions are slightly decreased in size compared to prior examination, consistent with treatment response of hepatic metastatic disease. 2. Unchanged sclerotic lesion of the anterior left iliac  crest. Unchanged lytic superior endplate deformity of L3, which remains equivocal for metastatic lesion versus mechanical Schmorl deformity. No CT evidence of new osseous metastatic disease. 3. No evidence of new  metastatic disease in the chest, abdomen, or pelvis. 4. Coronary artery disease.   05/05/2022 Imaging    IMPRESSION: 1. Interval decreased size of the largest hepatic metastatic lesion with stable size of the additional subcentimeter bilobar hepatic lesions. Continued decrease in size of the bilobar hepatic metastases. 2. Similar appearance of the osseous metatatic disease. No new aggressive lytic or blastic lesion of bone. 3. No evidence of new or progressive metastatic disease in the chest, abdomen or pelvis.     05/05/2022 Imaging    IMPRESSION: Faint residual radiotracer uptake in the left hemipelvis.   New focus of mild abnormal radiotracer uptake in the right lateral seventh rib without discrete CT correlate on same day examination, nonspecific suggest correlation with history of interval trauma.     07/07/2022 -  Chemotherapy   Patient is on Treatment Plan : BREAST Trastuzumab  + Pertuzumab  q21d     02/28/2023 Echocardiogram   IMPRESSIONS   1. Left ventricular ejection fraction, by estimation, is 55 to 60%. The left ventricle has normal function. The left ventricle has no regional wall motion abnormalities. Left ventricular diastolic parameters are consistent with Grade I diastolic  dysfunction (impaired relaxation). The average left ventricular global longitudinal strain is -17.1 %. The global longitudinal strain is normal.   2. Right ventricular systolic function is normal. The right ventricular size is normal. There is normal pulmonary artery systolic pressure. The estimated right ventricular systolic pressure is 28.8 mmHg.   3. The mitral valve is normal in structure. No evidence of mitral valve regurgitation. No evidence of mitral stenosis.   4. The aortic valve is tricuspid. Aortic valve regurgitation is trivial. No aortic stenosis is present.   5. The inferior vena cava is normal in size with greater than 50% respiratory variability, suggesting right atrial pressure of 3  mmHg.  Comparison(s): No significant change from prior study. Prior GLS: -18.0.      07/02/2023 Imaging   CT CAP with contrast  IMPRESSION: CHEST:  No evidence of breast cancer metastasis in the chest.  PELVIS:  1. No evidence recurrent breast cancer within liver. Stable small hypodensities. 2. Previous described metastatic skeletal lesions are not well appreciated. 3. No new or progressive disease.      Discussed the use of AI scribe software for clinical note transcription with the patient, who gave verbal consent to proceed.  History of Present Illness Carolyn Stewart is a 64 year old female with metastatic ER+, HER2+ right breast cancer with bone metastases who presents for follow-up of worsening chemotherapy-induced peripheral neuropathy.  She is on ongoing HER2-targeted therapy and letrozole  for metastatic right breast cancer with osseous involvement, given every three weeks via port at alternating infusion centers. Recent echocardiogram was normal and she follows with cardiology.  Her main issue is progressive chemotherapy-induced peripheral neuropathy. She has persistent, gradually worsening numbness and paresthesias in both feet now extending to her fingers, with frequent toe cramping, constant fingertip numbness, and episodic hand paresthesias that cause her to drop objects and feel less steady when walking. Symptoms started after chemotherapy and now occasionally disrupt sleep. Gabapentin  provides partial relief of tingling and pain but not numbness, and benefit has lessened. She takes daily potassium, magnesium, and calcium supplements for cramping. Recent labs show potassium at the low end of normal. She otherwise feels well  with no additional new symptoms relevant to her cancer care.     All other systems were reviewed with the patient and are negative.  MEDICAL HISTORY:  Past Medical History:  Diagnosis Date   Cancer (HCC) 2021   Hypertension     SURGICAL  HISTORY: Past Surgical History:  Procedure Laterality Date   left parotid tumor removal   2006   PORTACATH PLACEMENT Left 12/15/2019   Procedure: INSERTION PORT-A-CATH WITH ULTRASOUND GUIDANCE;  Surgeon: Aron Shoulders, MD;  Location: [facility] OR;  Service: General;  Laterality: Left;   TONSILLECTOMY      I have reviewed the social history and family history with the patient and they are unchanged from previous note.  ALLERGIES:  has no known allergies.  MEDICATIONS:  Current Outpatient Medications  Medication Sig Dispense Refill   acetaminophen  (TYLENOL ) 500 MG tablet Take 1,000 mg by mouth every 6 (six) hours as needed for moderate pain or headache.     amLODipine  (NORVASC ) 10 MG tablet Take 2 tablets (20 mg total) by mouth daily. 90 tablet 3   Cholecalciferol (VITAMIN D3) 50 MCG (2000 UT) TABS Take 2,000 Units by mouth daily.      gabapentin  (NEURONTIN ) 100 MG capsule TAKE 2 CAPSULES BY MOUTH AT BEDTIME. 60 capsule 2   letrozole  (FEMARA ) 2.5 MG tablet Take 1 tablet (2.5 mg total) by mouth daily. 90 tablet 3   lidocaine -prilocaine  (EMLA ) cream Apply 1 Application topically as needed. 30 g 1   losartan  (COZAAR ) 100 MG tablet TAKE 1 TABLET BY MOUTH EVERY DAY 90 tablet 3   metoprolol  succinate (TOPROL -XL) 100 MG 24 hr tablet TAKE 1 TABLET BY MOUTH EVERY DAY 90 tablet 3   omeprazole (PRILOSEC OTC) 20 MG tablet Take 20 mg by mouth daily.     potassium chloride  (KLOR-CON  M) 10 MEQ tablet TAKE 1 TABLET BY MOUTH EVERY DAY 90 tablet 1   No current facility-administered medications for this visit.   Facility-Administered Medications Ordered in Other Visits  Medication Dose Route Frequency Provider Last Rate Last Admin   sodium chloride  flush (NS) 0.9 % injection 10 mL  10 mL Intravenous PRN Lanny Callander, MD        PHYSICAL EXAMINATION: ECOG PERFORMANCE STATUS: 1 - Symptomatic but completely ambulatory  Vitals:   05/08/24 0900  BP: 130/76  Pulse: 79  Resp: 17  Temp: 98.3 F (36.8 C)  SpO2:  97%   Wt Readings from Last 3 Encounters:  05/08/24 204 lb 12.8 oz (92.9 kg)  04/18/24 202 lb 8 oz (91.9 kg)  04/01/24 201 lb 6.4 oz (91.4 kg)     GENERAL:alert, no distress and comfortable SKIN: skin color, texture, turgor are normal, no rashes or significant lesions EYES: normal, Conjunctiva are pink and non-injected, sclera clear NECK: supple, thyroid normal size, non-tender, without nodularity LYMPH:  no palpable lymphadenopathy in the cervical, axillary  LUNGS: clear to auscultation and percussion with normal breathing effort HEART: regular rate & rhythm and no murmurs and no lower extremity edema ABDOMEN:abdomen soft, non-tender and normal bowel sounds Musculoskeletal:no cyanosis of digits and no clubbing  NEURO: alert & oriented x 3 with fluent speech, no focal motor/sensory deficits  Physical Exam    LABORATORY DATA:  I have reviewed the data as listed    Latest Ref Rng & Units 05/08/2024    8:44 AM 03/06/2024    7:36 AM 01/03/2024    7:42 AM  CBC  WBC 4.0 - 10.5 K/uL 6.8  6.1  5.2  Hemoglobin 12.0 - 15.0 g/dL 87.0  87.7  87.1   Hematocrit 36.0 - 46.0 % 38.0  36.7  39.3   Platelets 150 - 400 K/uL 309  283  298         Latest Ref Rng & Units 05/08/2024    8:44 AM 03/06/2024    7:36 AM 01/03/2024    7:42 AM  CMP  Glucose 70 - 99 mg/dL 94  97  99   BUN 8 - 23 mg/dL 11  17  16    Creatinine 0.44 - 1.00 mg/dL 9.10  9.19  9.14   Sodium 135 - 145 mmol/L 141  142  141   Potassium 3.5 - 5.1 mmol/L 4.0  3.7  3.8   Chloride 98 - 111 mmol/L 106  110  110   CO2 22 - 32 mmol/L 25  27  26    Calcium 8.9 - 10.3 mg/dL 8.9  9.2  9.2   Total Protein 6.5 - 8.1 g/dL 7.3  7.0  7.1   Total Bilirubin 0.0 - 1.2 mg/dL 0.4  0.4  0.4   Alkaline Phos 38 - 126 U/L 99  91  79   AST 15 - 41 U/L 18  15  16    ALT 0 - 44 U/L 8  14  13        RADIOGRAPHIC STUDIES: I have personally reviewed the radiological images as listed and agreed with the findings in the report. No results found.     Orders Placed This Encounter  Procedures   CT CHEST ABDOMEN PELVIS W CONTRAST    Standing Status:   Future    Expected Date:   07/03/2024    Expiration Date:   05/08/2025    If indicated for the ordered procedure, I authorize the administration of contrast media per Radiology protocol:   Yes    Does the patient have a contrast media/X-ray dye allergy?:   No    Preferred imaging location?:   [facility]    Release to patient:   Immediate    If indicated for the ordered procedure, I authorize the administration of oral contrast media per Radiology protocol:   Yes   NM Bone Scan Whole Body    Standing Status:   Future    Expected Date:   07/03/2024    Expiration Date:   05/08/2025    If indicated for the ordered procedure, I authorize the administration of a radiopharmaceutical per Radiology protocol:   Yes    Preferred imaging location?:   [facility]   Ambulatory referral to Physical Therapy    Referral Priority:   Routine    Referral Type:   Physical Medicine    Referral Reason:   Specialty Services Required    Requested Specialty:   Physical Therapy    Number of Visits Requested:   1   All questions were answered. The patient knows to call the clinic with any problems, questions or concerns. No barriers to learning was detected. The total time spent in the appointment was 40 minutes, including review of chart and various tests results, discussions about plan of care and coordination of care plan     Onita Mattock, MD 05/08/2024     "

## 2024-05-08 NOTE — Assessment & Plan Note (Signed)
-  from breast cancer  --bone scan on 12/10/19 showed metastatic disease in left acetabulum, L3, and right 10th rib.  --Zometa  started 06/18/20, but held after second dose due to loss of insurance. Restarted in 05/2022

## 2024-05-09 ENCOUNTER — Other Ambulatory Visit: Payer: Self-pay | Admitting: Hematology

## 2024-05-09 DIAGNOSIS — C50411 Malignant neoplasm of upper-outer quadrant of right female breast: Secondary | ICD-10-CM

## 2024-05-09 LAB — CANCER ANTIGEN 27.29: CA 27.29: 43.9 U/mL — ABNORMAL HIGH (ref 0.0–38.6)

## 2024-05-13 ENCOUNTER — Other Ambulatory Visit: Payer: Self-pay

## 2024-05-14 NOTE — Therapy (Signed)
 " OUTPATIENT PHYSICAL THERAPY  UPPER EXTREMITY ONCOLOGY EVALUATION  Patient Name: Carolyn Stewart MRN: [id number] DOB:[birth year], 64 y.o., female Today's Date: 05/15/2024  END OF SESSION:  PT End of Session - 05/15/24 1444     Visit Number 1    Number of Visits 7    Date for Recertification  07/03/24    Authorization Type none    PT Start Time 1400    PT Stop Time 1444    PT Time Calculation (min) 44 min    Activity Tolerance Patient tolerated treatment well    Behavior During Therapy [facility] for tasks assessed/performed          Past Medical History:  Diagnosis Date   Cancer (HCC) 2021   Hypertension    Past Surgical History:  Procedure Laterality Date   left parotid tumor removal   2006   PORTACATH PLACEMENT Left 12/15/2019   Procedure: INSERTION PORT-A-CATH WITH ULTRASOUND GUIDANCE;  Surgeon: Aron Shoulders, MD;  Location: [facility] OR;  Service: General;  Laterality: Left;   TONSILLECTOMY     Patient Active Problem List   Diagnosis Date Noted   Metastasis to bone (HCC) 03/23/2022   Chemotherapy induced cardiomyopathy 03/23/2022   Hypertension 03/26/2020   Hypokalemia 01/09/2020   Malignant neoplasm of upper-outer quadrant of right breast in female, estrogen receptor positive (HCC) 12/01/2019    PCP: Eleanor Driver, PA-C  REFERRING PROVIDER: Onita Mattock, MD  REFERRING DIAG:  Diagnosis  C50.411,Z17.0 (ICD-10-CM) - Malignant neoplasm of upper-outer quadrant of right breast in female, estrogen receptor positive (HCC)    THERAPY DIAG:  Malignant neoplasm of upper-outer quadrant of right breast in female, estrogen receptor positive (HCC)  Chemotherapy induced cardiomyopathy  Metastasis to bone (HCC)  Other abnormalities of gait and mobility  ONSET DATE: 2021  Rationale for Evaluation and Treatment: Rehabilitation  SUBJECTIVE:                                                                                                                                                                                            SUBJECTIVE STATEMENT: it doesn't bother me much during the day but at night it is really bad.  The finger tips are always numb.  I do feel like my balance is off and I am tripping more.  I take gabapentin  at night.  I get a lot of cramps.  I have a foot massager.    PERTINENT HISTORY: stage IV metastatic breast cancer originally with mets to bone in left acetabulum, L3, and 10th rib on the Rt. Recent scans showing negative for residual disease and no bone disease. Completed chemo in 2021.  Other Hx:  Rt knee OA  PAIN:  Are you having pain? No not now.  At night its not always pain its more a sensation and annoyance.    PRECAUTIONS: metastatic breast cancer   RED FLAGS: None   WEIGHT BEARING RESTRICTIONS: No  FALLS:  Has patient fallen in last 6 months? Yes. Number of falls 1 - I walk very slowly at night.    LIVING ENVIRONMENT: Lives with: lives with their family and lives with their spouse Stairs: Yes; 2 rails, I can do them but I don't carry anything up or down.    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    LEISURE: sometimes I walk on the TM and have a gazelle,    HAND DOMINANCE: right   PRIOR LEVEL OF FUNCTION: Independent  PATIENT GOALS: I just want to feel more sure footed.     OBJECTIVE: Note: Objective measures were completed at Evaluation unless otherwise noted.  COGNITION: Overall cognitive status: Within functional limits for tasks assessed   PALPATION:   OBSERVATIONS / OTHER ASSESSMENTS:   SENSATION: Rt: felt all 9 locations Lt:  felt 7/9 locations  Vibration sense intact   POSTURE: WNL  UPPER EXTREMITY STRENGTH: 50# grip bil   LOWER EXTREMITY MMT:    MMT Right eval Left eval  Hip flexion    Hip extension    Hip abduction    Hip adduction    Hip internal rotation    Hip external rotation    Knee flexion 5 5  Knee extension 5 5  Ankle  dorsiflexion 4 4  Ankle plantarflexion 5 4  Ankle inversion    Ankle eversion     (Blank rows = not tested)   FUNCTIONAL TESTS:  5 times sit to stand: 12.58sec which is is the norm of 12-13 seconds.    GAIT: No limitations in clinic   Fullerton balance scale:  1 2 2 4 3 4 4 4 5 2  6  1:  Rt: 4 , L: 2 7 4 8  0  9 2  10 4   27/40 - indicating a risk for falls                                                                                                                           TREATMENT DATE:  05/15/24 Eval performed Discussed POC Showed and gave HEP for stretches on and off home vibration plate which she has    PATIENT EDUCATION:  Education details: per today's note Person educated: Patient Education method: Medical Illustrator Education comprehension: verbalized understanding and returned demonstration  HOME EXERCISE PROGRAM: Access Code: [email or web address] URL: [email or web address] Date: 05/15/2024 Prepared by: Saddie Raw  Exercises - Seated Hamstring Stretch  - 1 x daily - 7 x weekly - 1-3 sets - 3 reps - 20-30 seconds hold - Gastroc Stretch on Wall  - 1 x daily - 7 x  weekly - 1-3 sets - 3 reps - 20-30 seconds hold - Seated Heel Toe Raises  - 1 x daily - 7 x weekly - 1-3 sets - 3 reps - 20-30 seconds hold  ASSESSMENT:  CLINICAL IMPRESSION: Patient is a 64 y.o. female who was seen today for physical therapy evaluation and treatment for her CIPN which affects her balance.  Her strength is good and the protective sensation and vibration sensation are intact bilaterally.  She demonstrates mainly difficulty with narrow base of support and single leg activities.  She has a lot of discomfort at night and was shown some stretches to start doing in the evening.  Pt does have a hx of bone mets with some Lt hip pain but not noted on xray anymore.  She does have OA in the Rt knee and some Lt hip pain.    OBJECTIVE IMPAIRMENTS: decreased balance, decreased  knowledge of condition, and difficulty walking.   ACTIVITY LIMITATIONS: sleeping, stairs, and locomotion level  PARTICIPATION LIMITATIONS: community activity  PERSONAL FACTORS: Time since onset of injury/illness/exacerbation are also affecting patient's functional outcome.   REHAB POTENTIAL: Good  CLINICAL DECISION MAKING: Stable/uncomplicated  EVALUATION COMPLEXITY: Low  GOALS: Goals reviewed with patient? Yes  SHORT TERM GOALS: Target date: 05/15/24  Pt will be ind with initial stretches for nerve mobility  Baseline: Goal status: MET   LONG TERM GOALS: Target date: 07/03/24  Pt will decrease leg cramping and discomfort at night by at least 25% Baseline:  Goal status: INITIAL  2.  Pt will improve fullerton balance score to 23/40 to demonstrate improved safety Baseline:  Goal status: INITIAL  3.  Pt will demonstrate a SL stance time of at least 10dec bil Baseline:  Goal status: INITIAL   PLAN:  PT FREQUENCY: 1x/week - pt request  PT DURATION: 6 weeks  PLANNED INTERVENTIONS: 97164- PT Re-evaluation, 97750- Physical Performance Testing, 97110-Therapeutic exercises, 97530- Therapeutic activity, 97112- Neuromuscular re-education, 97140- Manual therapy, [phone]- Gait training, Patient/Family education, Therapeutic exercises, Therapeutic activity, Neuromuscular re-education, Gait training, and Self Care  PLAN FOR NEXT SESSION: higher level balance, review stretches, show on vibration plate (pt has one)  Srihan Brutus, Saddie SAUNDERS, PT 05/15/2024, 2:45 PM  "

## 2024-05-15 ENCOUNTER — Ambulatory Visit: Attending: Hematology | Admitting: Rehabilitation

## 2024-05-15 ENCOUNTER — Encounter: Payer: Self-pay | Admitting: Rehabilitation

## 2024-05-15 DIAGNOSIS — I427 Cardiomyopathy due to drug and external agent: Secondary | ICD-10-CM | POA: Insufficient documentation

## 2024-05-15 DIAGNOSIS — C50411 Malignant neoplasm of upper-outer quadrant of right female breast: Secondary | ICD-10-CM | POA: Diagnosis present

## 2024-05-15 DIAGNOSIS — T451X5A Adverse effect of antineoplastic and immunosuppressive drugs, initial encounter: Secondary | ICD-10-CM | POA: Diagnosis present

## 2024-05-15 DIAGNOSIS — R2689 Other abnormalities of gait and mobility: Secondary | ICD-10-CM | POA: Insufficient documentation

## 2024-05-15 DIAGNOSIS — C7951 Secondary malignant neoplasm of bone: Secondary | ICD-10-CM | POA: Insufficient documentation

## 2024-05-15 DIAGNOSIS — Z170 Estrogen receptor positive status [ER+]: Secondary | ICD-10-CM | POA: Insufficient documentation

## 2024-05-19 ENCOUNTER — Other Ambulatory Visit: Payer: Self-pay | Admitting: Hematology

## 2024-05-19 ENCOUNTER — Ambulatory Visit: Payer: Self-pay

## 2024-05-26 ENCOUNTER — Ambulatory Visit: Admitting: Rehabilitation

## 2024-05-30 ENCOUNTER — Inpatient Hospital Stay: Attending: Hematology

## 2024-05-30 VITALS — BP 116/70 | HR 82 | Temp 98.0°F | Resp 169 | Wt 205.7 lb

## 2024-05-30 DIAGNOSIS — Z170 Estrogen receptor positive status [ER+]: Secondary | ICD-10-CM

## 2024-05-30 MED ORDER — TRASTUZUMAB-ANNS CHEMO 150 MG IV SOLR
6.0000 mg/kg | Freq: Once | INTRAVENOUS | Status: AC
  Administered 2024-05-30: 600 mg via INTRAVENOUS
  Filled 2024-05-30: qty 28.57

## 2024-05-30 MED ORDER — SODIUM CHLORIDE 0.9 % IV SOLN: INTRAVENOUS | Status: DC

## 2024-05-30 MED ORDER — ACETAMINOPHEN 325 MG PO TABS
650.0000 mg | ORAL_TABLET | Freq: Once | ORAL | Status: AC
  Administered 2024-05-30: 650 mg via ORAL
  Filled 2024-05-30: qty 2

## 2024-05-30 MED ORDER — DIPHENHYDRAMINE HCL 25 MG PO CAPS
50.0000 mg | ORAL_CAPSULE | Freq: Once | ORAL | Status: AC
  Administered 2024-05-30: 50 mg via ORAL
  Filled 2024-05-30: qty 2

## 2024-05-30 NOTE — Patient Instructions (Signed)
 Trastuzumab  Injection What is this medication? TRASTUZUMAB  (tras TOO zoo mab) treats breast cancer and stomach cancer. It works by blocking a protein that causes cancer cells to grow and multiply. This helps to slow or stop the spread of cancer cells. This medicine may be used for other purposes; ask your health care provider or pharmacist if you have questions. COMMON BRAND NAME(S): Herceptin , HERCESSI, Herzuma , KANJINTI , Ogivri , Ontruzant , Trazimera  What should I tell my care team before I take this medication? They need to know if you have any of these conditions: Heart failure Lung disease An unusual or allergic reaction to trastuzumab , other medications, foods, dyes, or preservatives Pregnant or trying to get pregnant Breast-feeding How should I use this medication? This medication is infused into a vein. It is given by your care team in a hospital or clinic setting. Talk to your care team about the use of this medication in children. Special care may be needed. Overdosage: If you think you have taken too much of this medicine contact a poison control center or emergency room at once. NOTE: This medicine is only for you. Do not share this medicine with others. What if I miss a dose? Keep appointments for follow-up doses. It is important not to miss your dose. Call your care team if you are unable to keep an appointment. What may interact with this medication? Certain types of chemotherapy, such as daunorubicin, doxorubicin, epirubicin, idarubicin This list may not describe all possible interactions. Give your health care provider a list of all the medicines, herbs, non-prescription drugs, or dietary supplements you use. Also tell them if you smoke, drink alcohol, or use illegal drugs. Some items may interact with your medicine. What should I watch for while using this medication? Your condition will be monitored carefully while you are receiving this medication. This medication may make  you feel generally unwell. This is not uncommon, as chemotherapy affects healthy cells as well as cancer cells. Report any side effects. Continue your course of treatment even though you feel ill unless your care team tells you to stop. This medication may increase your risk of getting an infection. Call your care team for advice if you get a fever, chills, sore throat, or other symptoms of a cold or flu. Do not treat yourself. Try to avoid being around people who are sick. Avoid taking medications that contain aspirin, acetaminophen , ibuprofen, naproxen, or ketoprofen unless instructed by your care team. These medications can hide a fever. Talk to your care team if you may be pregnant. Serious birth defects can occur if you take this medication during pregnancy and for 7 months after the last dose. You will need a negative pregnancy test before starting this medication. Contraception is recommended while taking this medication and for 7 months after the last dose. Your care team can help you find the option that works for you. Do not breastfeed while taking this medication and for 7 months after stopping treatment. What side effects may I notice from receiving this medication? Side effects that you should report to your care team as soon as possible: Allergic reactions or angioedema--skin rash, itching or hives, swelling of the face, eyes, lips, tongue, arms, or legs, trouble swallowing or breathing Dry cough, shortness of breath or trouble breathing Heart failure--shortness of breath, swelling of the ankles, feet, or hands, sudden weight gain, unusual weakness or fatigue Infection--fever, chills, cough, or sore throat Infusion reactions--chest pain, shortness of breath or trouble breathing, feeling faint or lightheaded Side  effects that usually do not require medical attention (report to your care team if they continue or are bothersome): Diarrhea Dizziness Headache Nausea Trouble  sleeping Vomiting This list may not describe all possible side effects. Call your doctor for medical advice about side effects. You may report side effects to FDA at 1-800-FDA-1088. Where should I keep my medication? This medication is given in a hospital or clinic. It will not be stored at home. NOTE: This sheet is a summary. It may not cover all possible information. If you have questions about this medicine, talk to your doctor, pharmacist, or health care provider.  2025 Elsevier/Gold Standard (2024-02-14 00:00:00)

## 2024-06-05 ENCOUNTER — Ambulatory Visit

## 2024-06-12 ENCOUNTER — Ambulatory Visit: Admitting: Rehabilitation

## 2024-06-19 ENCOUNTER — Ambulatory Visit

## 2024-06-20 ENCOUNTER — Inpatient Hospital Stay

## 2024-06-26 ENCOUNTER — Ambulatory Visit

## 2024-07-10 ENCOUNTER — Inpatient Hospital Stay: Admitting: Hematology

## 2024-07-10 ENCOUNTER — Inpatient Hospital Stay
